# Patient Record
Sex: Female | Born: 1980 | Race: Black or African American | Hispanic: No | Smoking: Former smoker
Health system: Southern US, Community
[De-identification: ages and names within clinical notes are randomized; demographics above are authoritative.]

## PROBLEM LIST (undated history)

## (undated) DIAGNOSIS — R519 Headache, unspecified: Secondary | ICD-10-CM

## (undated) DIAGNOSIS — K219 Gastro-esophageal reflux disease without esophagitis: Secondary | ICD-10-CM

## (undated) DIAGNOSIS — K5900 Constipation, unspecified: Secondary | ICD-10-CM

## (undated) DIAGNOSIS — I10 Essential (primary) hypertension: Secondary | ICD-10-CM

## (undated) DIAGNOSIS — A568 Sexually transmitted chlamydial infection of other sites: Secondary | ICD-10-CM

## (undated) DIAGNOSIS — F32A Depression, unspecified: Secondary | ICD-10-CM

## (undated) DIAGNOSIS — M722 Plantar fascial fibromatosis: Secondary | ICD-10-CM

## (undated) DIAGNOSIS — R51 Headache: Secondary | ICD-10-CM

## (undated) DIAGNOSIS — R87619 Unspecified abnormal cytological findings in specimens from cervix uteri: Secondary | ICD-10-CM

## (undated) DIAGNOSIS — N760 Acute vaginitis: Secondary | ICD-10-CM

## (undated) DIAGNOSIS — IMO0002 Reserved for concepts with insufficient information to code with codable children: Secondary | ICD-10-CM

## (undated) DIAGNOSIS — F329 Major depressive disorder, single episode, unspecified: Secondary | ICD-10-CM

## (undated) DIAGNOSIS — B9689 Other specified bacterial agents as the cause of diseases classified elsewhere: Secondary | ICD-10-CM

## (undated) DIAGNOSIS — N938 Other specified abnormal uterine and vaginal bleeding: Secondary | ICD-10-CM

## (undated) DIAGNOSIS — J45909 Unspecified asthma, uncomplicated: Secondary | ICD-10-CM

## (undated) DIAGNOSIS — M719 Bursopathy, unspecified: Secondary | ICD-10-CM

## (undated) DIAGNOSIS — G5710 Meralgia paresthetica, unspecified lower limb: Secondary | ICD-10-CM

## (undated) DIAGNOSIS — Z8489 Family history of other specified conditions: Secondary | ICD-10-CM

## (undated) DIAGNOSIS — T7840XA Allergy, unspecified, initial encounter: Secondary | ICD-10-CM

## (undated) HISTORY — PX: WISDOM TOOTH EXTRACTION: SHX21

## (undated) HISTORY — DX: Allergy, unspecified, initial encounter: T78.40XA

## (undated) HISTORY — DX: Plantar fascial fibromatosis: M72.2

## (undated) HISTORY — PX: WRIST SURGERY: SHX841

## (undated) HISTORY — DX: Headache: R51

## (undated) HISTORY — DX: Constipation, unspecified: K59.00

## (undated) HISTORY — PX: TUBAL LIGATION: SHX77

## (undated) HISTORY — PX: GYNECOLOGIC CRYOSURGERY: SHX857

## (undated) HISTORY — DX: Meralgia paresthetica, unspecified lower limb: G57.10

## (undated) HISTORY — DX: Headache, unspecified: R51.9

## (undated) HISTORY — DX: Bursopathy, unspecified: M71.9

## (undated) HISTORY — DX: Depression, unspecified: F32.A

---
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
## 2003-12-06 ENCOUNTER — Emergency Department (HOSPITAL_COMMUNITY): Admission: EM | Admit: 2003-12-06 | Discharge: 2003-12-07 | Payer: Self-pay | Admitting: Emergency Medicine

## 2004-01-20 ENCOUNTER — Inpatient Hospital Stay (HOSPITAL_COMMUNITY): Admission: AD | Admit: 2004-01-20 | Discharge: 2004-01-21 | Payer: Self-pay | Admitting: *Deleted

## 2004-02-27 ENCOUNTER — Inpatient Hospital Stay (HOSPITAL_COMMUNITY): Admission: AD | Admit: 2004-02-27 | Discharge: 2004-02-27 | Payer: Self-pay | Admitting: Obstetrics and Gynecology

## 2004-03-04 ENCOUNTER — Ambulatory Visit (HOSPITAL_COMMUNITY): Admission: RE | Admit: 2004-03-04 | Discharge: 2004-03-04 | Payer: Self-pay | Admitting: Family Medicine

## 2004-08-04 ENCOUNTER — Inpatient Hospital Stay (HOSPITAL_COMMUNITY): Admission: AD | Admit: 2004-08-04 | Discharge: 2004-08-07 | Payer: Self-pay | Admitting: *Deleted

## 2005-08-27 ENCOUNTER — Emergency Department (HOSPITAL_COMMUNITY): Admission: EM | Admit: 2005-08-27 | Discharge: 2005-08-27 | Payer: Self-pay | Admitting: Emergency Medicine

## 2006-06-25 ENCOUNTER — Inpatient Hospital Stay (HOSPITAL_COMMUNITY): Admission: AD | Admit: 2006-06-25 | Discharge: 2006-06-25 | Payer: Self-pay | Admitting: Obstetrics and Gynecology

## 2006-09-07 ENCOUNTER — Emergency Department (HOSPITAL_COMMUNITY): Admission: EM | Admit: 2006-09-07 | Discharge: 2006-09-07 | Payer: Self-pay | Admitting: *Deleted

## 2006-09-13 ENCOUNTER — Emergency Department (HOSPITAL_COMMUNITY): Admission: EM | Admit: 2006-09-13 | Discharge: 2006-09-13 | Payer: Self-pay | Admitting: Family Medicine

## 2007-02-07 ENCOUNTER — Inpatient Hospital Stay (HOSPITAL_COMMUNITY): Admission: AD | Admit: 2007-02-07 | Discharge: 2007-02-08 | Payer: Self-pay | Admitting: Obstetrics & Gynecology

## 2007-03-28 ENCOUNTER — Inpatient Hospital Stay (HOSPITAL_COMMUNITY): Admission: AD | Admit: 2007-03-28 | Discharge: 2007-03-28 | Payer: Self-pay | Admitting: Obstetrics

## 2007-05-19 ENCOUNTER — Inpatient Hospital Stay (HOSPITAL_COMMUNITY): Admission: AD | Admit: 2007-05-19 | Discharge: 2007-05-19 | Payer: Self-pay | Admitting: Obstetrics

## 2007-05-20 ENCOUNTER — Inpatient Hospital Stay (HOSPITAL_COMMUNITY): Admission: AD | Admit: 2007-05-20 | Discharge: 2007-05-20 | Payer: Self-pay | Admitting: Obstetrics

## 2007-07-23 ENCOUNTER — Inpatient Hospital Stay (HOSPITAL_COMMUNITY): Admission: AD | Admit: 2007-07-23 | Discharge: 2007-07-25 | Payer: Self-pay | Admitting: Obstetrics

## 2007-07-24 ENCOUNTER — Encounter (INDEPENDENT_AMBULATORY_CARE_PROVIDER_SITE_OTHER): Payer: Self-pay | Admitting: Obstetrics

## 2007-11-23 ENCOUNTER — Inpatient Hospital Stay (HOSPITAL_COMMUNITY): Admission: AD | Admit: 2007-11-23 | Discharge: 2007-11-23 | Payer: Self-pay | Admitting: Obstetrics

## 2007-12-11 ENCOUNTER — Inpatient Hospital Stay (HOSPITAL_COMMUNITY): Admission: AD | Admit: 2007-12-11 | Discharge: 2007-12-11 | Payer: Self-pay | Admitting: Obstetrics

## 2008-03-20 ENCOUNTER — Inpatient Hospital Stay (HOSPITAL_COMMUNITY): Admission: AD | Admit: 2008-03-20 | Discharge: 2008-03-20 | Payer: Self-pay | Admitting: Obstetrics

## 2008-04-26 ENCOUNTER — Emergency Department (HOSPITAL_COMMUNITY): Admission: EM | Admit: 2008-04-26 | Discharge: 2008-04-26 | Payer: Self-pay | Admitting: Emergency Medicine

## 2008-07-02 ENCOUNTER — Emergency Department (HOSPITAL_COMMUNITY): Admission: EM | Admit: 2008-07-02 | Discharge: 2008-07-03 | Payer: Self-pay | Admitting: Emergency Medicine

## 2008-12-30 ENCOUNTER — Emergency Department (HOSPITAL_BASED_OUTPATIENT_CLINIC_OR_DEPARTMENT_OTHER): Admission: EM | Admit: 2008-12-30 | Discharge: 2008-12-30 | Payer: Self-pay | Admitting: Emergency Medicine

## 2009-03-22 ENCOUNTER — Emergency Department (HOSPITAL_BASED_OUTPATIENT_CLINIC_OR_DEPARTMENT_OTHER): Admission: EM | Admit: 2009-03-22 | Discharge: 2009-03-22 | Payer: Self-pay | Admitting: Emergency Medicine

## 2009-06-17 ENCOUNTER — Emergency Department (HOSPITAL_BASED_OUTPATIENT_CLINIC_OR_DEPARTMENT_OTHER): Admission: EM | Admit: 2009-06-17 | Discharge: 2009-06-17 | Payer: Self-pay | Admitting: Emergency Medicine

## 2009-08-30 ENCOUNTER — Emergency Department (HOSPITAL_COMMUNITY): Admission: EM | Admit: 2009-08-30 | Discharge: 2009-08-31 | Payer: Self-pay | Admitting: Emergency Medicine

## 2009-09-17 ENCOUNTER — Emergency Department (HOSPITAL_BASED_OUTPATIENT_CLINIC_OR_DEPARTMENT_OTHER): Admission: EM | Admit: 2009-09-17 | Discharge: 2009-09-17 | Payer: Self-pay | Admitting: Emergency Medicine

## 2009-11-14 ENCOUNTER — Emergency Department (HOSPITAL_COMMUNITY): Admission: EM | Admit: 2009-11-14 | Discharge: 2009-11-14 | Payer: Self-pay | Admitting: Family Medicine

## 2010-01-03 ENCOUNTER — Emergency Department (HOSPITAL_BASED_OUTPATIENT_CLINIC_OR_DEPARTMENT_OTHER): Admission: EM | Admit: 2010-01-03 | Discharge: 2010-01-03 | Payer: Self-pay | Admitting: Emergency Medicine

## 2010-03-29 ENCOUNTER — Emergency Department (HOSPITAL_BASED_OUTPATIENT_CLINIC_OR_DEPARTMENT_OTHER): Admission: EM | Admit: 2010-03-29 | Discharge: 2010-03-29 | Payer: Self-pay | Admitting: Emergency Medicine

## 2010-04-03 ENCOUNTER — Emergency Department (HOSPITAL_BASED_OUTPATIENT_CLINIC_OR_DEPARTMENT_OTHER): Admission: EM | Admit: 2010-04-03 | Discharge: 2010-04-03 | Payer: Self-pay | Admitting: Emergency Medicine

## 2010-04-27 ENCOUNTER — Emergency Department (HOSPITAL_BASED_OUTPATIENT_CLINIC_OR_DEPARTMENT_OTHER): Admission: EM | Admit: 2010-04-27 | Discharge: 2010-04-27 | Payer: Self-pay | Admitting: Emergency Medicine

## 2010-10-16 ENCOUNTER — Emergency Department (HOSPITAL_BASED_OUTPATIENT_CLINIC_OR_DEPARTMENT_OTHER): Admission: EM | Admit: 2010-10-16 | Discharge: 2010-10-16 | Payer: Self-pay | Admitting: Emergency Medicine

## 2010-10-16 ENCOUNTER — Ambulatory Visit: Payer: Self-pay | Admitting: Diagnostic Radiology

## 2010-11-25 ENCOUNTER — Emergency Department (HOSPITAL_BASED_OUTPATIENT_CLINIC_OR_DEPARTMENT_OTHER)
Admission: EM | Admit: 2010-11-25 | Discharge: 2010-11-25 | Payer: Self-pay | Source: Home / Self Care | Admitting: Emergency Medicine

## 2010-11-30 DIAGNOSIS — A568 Sexually transmitted chlamydial infection of other sites: Secondary | ICD-10-CM

## 2010-11-30 HISTORY — DX: Sexually transmitted chlamydial infection of other sites: A56.8

## 2010-12-21 ENCOUNTER — Encounter: Payer: Self-pay | Admitting: Obstetrics & Gynecology

## 2011-02-09 LAB — URINALYSIS, ROUTINE W REFLEX MICROSCOPIC
Bilirubin Urine: NEGATIVE
Hgb urine dipstick: NEGATIVE
Nitrite: NEGATIVE
Specific Gravity, Urine: 1.023 (ref 1.005–1.030)
Urobilinogen, UA: 0.2 mg/dL (ref 0.0–1.0)
pH: 6.5 (ref 5.0–8.0)

## 2011-02-09 LAB — WET PREP, GENITAL
Trich, Wet Prep: NONE SEEN
Yeast Wet Prep HPF POC: NONE SEEN

## 2011-02-09 LAB — GC/CHLAMYDIA PROBE AMP, GENITAL: Chlamydia, DNA Probe: NEGATIVE

## 2011-02-09 LAB — PREGNANCY, URINE: Preg Test, Ur: NEGATIVE

## 2011-02-11 ENCOUNTER — Emergency Department (HOSPITAL_BASED_OUTPATIENT_CLINIC_OR_DEPARTMENT_OTHER)
Admission: EM | Admit: 2011-02-11 | Discharge: 2011-02-11 | Disposition: A | Discharge status: Home / Self Care | Payer: Self-pay | Attending: Emergency Medicine | Admitting: Emergency Medicine

## 2011-02-11 DIAGNOSIS — R1013 Epigastric pain: Secondary | ICD-10-CM | POA: Insufficient documentation

## 2011-02-11 LAB — URINALYSIS, ROUTINE W REFLEX MICROSCOPIC
Bilirubin Urine: NEGATIVE
Hgb urine dipstick: NEGATIVE
Ketones, ur: NEGATIVE mg/dL
Nitrite: NEGATIVE
Specific Gravity, Urine: 1.028 (ref 1.005–1.030)
pH: 6 (ref 5.0–8.0)

## 2011-02-16 LAB — RAPID STREP SCREEN (MED CTR MEBANE ONLY): Streptococcus, Group A Screen (Direct): NEGATIVE

## 2011-02-17 LAB — URINALYSIS, ROUTINE W REFLEX MICROSCOPIC
Glucose, UA: NEGATIVE mg/dL
Ketones, ur: NEGATIVE mg/dL
Nitrite: NEGATIVE
Specific Gravity, Urine: 1.014 (ref 1.005–1.030)
pH: 6 (ref 5.0–8.0)

## 2011-02-17 LAB — URINE MICROSCOPIC-ADD ON

## 2011-02-18 LAB — GC/CHLAMYDIA PROBE AMP, GENITAL
Chlamydia, DNA Probe: NEGATIVE
GC Probe Amp, Genital: POSITIVE — AB

## 2011-02-18 LAB — URINALYSIS, ROUTINE W REFLEX MICROSCOPIC
Bilirubin Urine: NEGATIVE
Glucose, UA: NEGATIVE mg/dL
Hgb urine dipstick: NEGATIVE
Protein, ur: NEGATIVE mg/dL
Urobilinogen, UA: 0.2 mg/dL (ref 0.0–1.0)

## 2011-02-18 LAB — RPR: RPR Ser Ql: NONREACTIVE

## 2011-02-18 LAB — WET PREP, GENITAL: Trich, Wet Prep: NONE SEEN

## 2011-02-18 LAB — URINE MICROSCOPIC-ADD ON

## 2011-03-02 LAB — POCT URINALYSIS DIP (DEVICE)
Bilirubin Urine: NEGATIVE
Hgb urine dipstick: NEGATIVE
Ketones, ur: NEGATIVE mg/dL
Nitrite: NEGATIVE
Protein, ur: NEGATIVE mg/dL
pH: 6.5 (ref 5.0–8.0)

## 2011-03-02 LAB — GC/CHLAMYDIA PROBE AMP, GENITAL
Chlamydia, DNA Probe: NEGATIVE
GC Probe Amp, Genital: NEGATIVE

## 2011-03-02 LAB — WET PREP, GENITAL
Clue Cells Wet Prep HPF POC: NONE SEEN
Trich, Wet Prep: NONE SEEN

## 2011-03-05 LAB — URINALYSIS, ROUTINE W REFLEX MICROSCOPIC
Bilirubin Urine: NEGATIVE
Nitrite: NEGATIVE
Protein, ur: 30 mg/dL — AB
Specific Gravity, Urine: 1.03 (ref 1.005–1.030)
Urobilinogen, UA: 0.2 mg/dL (ref 0.0–1.0)
pH: 6 (ref 5.0–8.0)

## 2011-03-05 LAB — PREGNANCY, URINE: Preg Test, Ur: NEGATIVE

## 2011-03-05 LAB — URINE MICROSCOPIC-ADD ON

## 2011-03-05 LAB — GC/CHLAMYDIA PROBE AMP, GENITAL: Chlamydia, DNA Probe: NEGATIVE

## 2011-03-05 LAB — WET PREP, GENITAL: Trich, Wet Prep: NONE SEEN

## 2011-03-08 LAB — URINALYSIS, ROUTINE W REFLEX MICROSCOPIC
Bilirubin Urine: NEGATIVE
Hgb urine dipstick: NEGATIVE
Nitrite: NEGATIVE
Specific Gravity, Urine: 1.028 (ref 1.005–1.030)
pH: 6.5 (ref 5.0–8.0)

## 2011-03-08 LAB — URINE MICROSCOPIC-ADD ON

## 2011-03-11 LAB — PREGNANCY, URINE: Preg Test, Ur: NEGATIVE

## 2011-03-11 LAB — GC/CHLAMYDIA PROBE AMP, GENITAL: GC Probe Amp, Genital: NEGATIVE

## 2011-03-11 LAB — URINALYSIS, ROUTINE W REFLEX MICROSCOPIC
Bilirubin Urine: NEGATIVE
Ketones, ur: NEGATIVE mg/dL
Nitrite: NEGATIVE
pH: 6 (ref 5.0–8.0)

## 2011-03-11 LAB — WET PREP, GENITAL: Trich, Wet Prep: NONE SEEN

## 2011-03-11 LAB — URINE CULTURE: Colony Count: 10000

## 2011-03-11 LAB — URINE MICROSCOPIC-ADD ON

## 2011-03-16 LAB — PREGNANCY, URINE: Preg Test, Ur: NEGATIVE

## 2011-03-16 LAB — URINALYSIS, ROUTINE W REFLEX MICROSCOPIC
Bilirubin Urine: NEGATIVE
Ketones, ur: NEGATIVE mg/dL
Nitrite: NEGATIVE
Protein, ur: NEGATIVE mg/dL
Urobilinogen, UA: 0.2 mg/dL (ref 0.0–1.0)
pH: 6 (ref 5.0–8.0)

## 2011-03-16 LAB — WET PREP, GENITAL
Trich, Wet Prep: NONE SEEN
Yeast Wet Prep HPF POC: NONE SEEN

## 2011-03-16 LAB — GC/CHLAMYDIA PROBE AMP, GENITAL: Chlamydia, DNA Probe: POSITIVE — AB

## 2011-04-17 NOTE — Op Note (Signed)
NAME:  Lauren Garza, Lauren Garza                      ACCOUNT NO.:  [id number]   MEDICAL RECORD NO.:  [id number]                   PATIENT TYPE:  INP   LOCATION:  9109                                 FACILITY:  [facility]   PHYSICIAN:  Kathreen Cosier, M.D.           DATE OF BIRTH:  17-May-1981   DATE OF PROCEDURE:  08/05/2004  DATE OF DISCHARGE:                                 OPERATIVE REPORT   The patient is a 30 year old gravida 2, para 1-0-0-1, [facility] August 03, 2004,  who became further dilated at 2:40 a.m. on August 05, 2004.  Started  having prolonged decels in fetal heart at that time.  She pushed to +3 stage  and vacuum applied.  There was no pop off through one contraction until  delivery of the vertex from ROA position.  Nuchal cord times one was loose  and reduced prior to delivery.  She had a female, Apgar 9 and 9, placenta was  spontaneous, intact.  She had a right sulcus tear and a first degree  perineal which were repaired with 2-0 Vicryl suture.  Delivery was effected  at 2:51.  Placenta was spontaneous and intact and the sulcus and the  perineum repaired with 2-0 Vicryl suture.                                               Kathreen Cosier, M.D.    BAM/MEDQ  D:  08/05/2004  T:  08/05/2004  Job:  [id number]

## 2011-04-17 NOTE — Op Note (Signed)
[facility]  Patient:    Lauren Garza, Lauren Garza Visit Number: [id number] MRN: [id number]          Service Type: OBS Location: 910A [id number] [id number] Attending Physician:  Venita Sheffield Dictated by:   Kathreen Cosier, M.D. Proc. Date: 12/16/01 Admit Date:  12/16/2001 Discharge Date: 12/18/2001                             Operative Report  PREOPERATIVE DIAGNOSIS:  POSTOPERATIVE DIAGNOSIS:  OPERATION:                    Vacuum-assisted vaginal delivery.  SURGEON:                      Kathreen Cosier, M.D.  ASSISTANT:  ANESTHESIA:  ESTIMATED BLOOD LOSS:  INDICATIONS:                  The patient is an 30 year old primigravida admitted at 4 cm, who made rapid progress and during her epidural developed a change in baseline and deep variables.  Normal blood pressure.  I was called because of this.  When I got in the room at 5:29 a.m. she was anterior lip, 0 station, pushing well.  The patient pushed a number of times and fetal heart rate was slow.  DESCRIPTION OF PROCEDURE:     Midline episiotomy was done and the vacuum applied at the +3 station.  Pressure was applied for one contraction with no popoff and delivery was effected from the LOA position of a female, Apgars 7 and 9.  The baby was DeLeed prior to delivery of the shoulders.  The episiotomy was repaired with 2-0 Vicryl and placenta delivered spontaneously. Dictated by:   Kathreen Cosier, M.D. Attending Physician:  Venita Sheffield DD:  12/16/01 TD:  12/18/01 Job: [id number]

## 2011-04-18 ENCOUNTER — Emergency Department (HOSPITAL_COMMUNITY)
Admission: EM | Admit: 2011-04-18 | Discharge: 2011-04-19 | Disposition: A | Discharge status: Home / Self Care | Payer: No Typology Code available for payment source | Attending: Emergency Medicine | Admitting: Emergency Medicine

## 2011-04-18 DIAGNOSIS — M25529 Pain in unspecified elbow: Secondary | ICD-10-CM | POA: Insufficient documentation

## 2011-04-18 DIAGNOSIS — Y92410 Unspecified street and highway as the place of occurrence of the external cause: Secondary | ICD-10-CM | POA: Insufficient documentation

## 2011-04-18 DIAGNOSIS — T1490XA Injury, unspecified, initial encounter: Secondary | ICD-10-CM | POA: Insufficient documentation

## 2011-04-18 DIAGNOSIS — M25519 Pain in unspecified shoulder: Secondary | ICD-10-CM | POA: Insufficient documentation

## 2011-04-19 ENCOUNTER — Emergency Department (HOSPITAL_COMMUNITY): Payer: No Typology Code available for payment source

## 2011-08-15 ENCOUNTER — Encounter (HOSPITAL_COMMUNITY): Payer: Self-pay | Admitting: *Deleted

## 2011-08-15 ENCOUNTER — Inpatient Hospital Stay (HOSPITAL_COMMUNITY)
Admission: AD | Admit: 2011-08-15 | Discharge: 2011-08-16 | Disposition: A | Discharge status: Home / Self Care | Payer: Medicaid Other | Source: Ambulatory Visit | Attending: Obstetrics | Admitting: Obstetrics

## 2011-08-15 DIAGNOSIS — R109 Unspecified abdominal pain: Secondary | ICD-10-CM | POA: Insufficient documentation

## 2011-08-15 HISTORY — DX: Unspecified abnormal cytological findings in specimens from cervix uteri: R87.619

## 2011-08-15 HISTORY — DX: Reserved for concepts with insufficient information to code with codable children: IMO0002

## 2011-08-15 LAB — URINE MICROSCOPIC-ADD ON

## 2011-08-15 LAB — URINALYSIS, ROUTINE W REFLEX MICROSCOPIC
Bilirubin Urine: NEGATIVE
Ketones, ur: NEGATIVE mg/dL
Nitrite: NEGATIVE
Protein, ur: NEGATIVE mg/dL

## 2011-08-15 LAB — POCT PREGNANCY, URINE: Preg Test, Ur: NEGATIVE

## 2011-08-15 MED ORDER — KETOROLAC TROMETHAMINE 60 MG/2ML IM SOLN
60.0000 mg | Freq: Four times a day (QID) | INTRAMUSCULAR | Status: DC | PRN
  Administered 2011-08-16: 60 mg via INTRAMUSCULAR
  Filled 2011-08-15: qty 2

## 2011-08-15 NOTE — Progress Notes (Signed)
Pt presents to mau for sharp abdominal pain and lower back pain.  Started about Wednesday night to Thursday morning.

## 2011-08-15 NOTE — Progress Notes (Signed)
Wet prep and cultures collected.  VE done per CNM.

## 2011-08-15 NOTE — Progress Notes (Signed)
K. Shaw, CNM at bedside.  Assessment done and poc discussed with pt.  

## 2011-08-15 NOTE — ED Provider Notes (Signed)
Chief Complaint:  Abdominal Pain   Lauren Garza is  30 y.o. [id number].  Patient's last menstrual period was 07/31/2011.Marland Kitchen  Her pregnancy status is negative.  She presents complaining of Abdominal Pain in the lower quads and radiating to her left side. Onset is described as gradual and has been present for  3 days.   Obstetrical/Gynecological History: Pertinent Gynecological History: Menses: flow is moderate Contraception: tubal ligation    Past Medical History: Past Medical History  Diagnosis Date  . Abnormal Pap smear     Past Surgical History: Past Surgical History  Procedure Date  . Gynecologic cryosurgery   . Tubal ligation     Family History: No family history on file.  Social History: History  Substance Use Topics  . Smoking status: Never Smoker   . Smokeless tobacco: Not on file  . Alcohol Use: Yes    Allergies:  Allergies  Allergen Reactions  . Clindamycin/Lincomycin Hives  . Flagyl (Metronidazole Hcl) Hives    Prescriptions prior to admission  Medication Sig Dispense Refill  . ibuprofen (ADVIL,MOTRIN) 200 MG tablet Take 400 mg by mouth every 6 (six) hours as needed. For pain         Review of Systems - neg for N/V but did have a loose stool today; no fever; ? Dysuria but not urgency  Physical Exam   Blood pressure 122/78, pulse 83, temperature 99.2 F (37.3 C), temperature source Oral, resp. rate 18, height 5' 4.25" (1.632 m), weight 98.884 kg (218 lb), last menstrual period 07/31/2011.  General: General appearance - alert, well appearing, and in no distress Focused Gynecological Exam: normal external genitalia, vulva, vagina, cervix, uterus and adnexa; no pain on bimanual; sm white/clear vag d/c; cx C/L/nontender ABD: no pain elicited on abd exam; neg for CVAT bilat  Labs:  Results for orders placed during the hospital encounter of 08/15/11 (from the past 24 hour(s))  URINALYSIS, ROUTINE W REFLEX MICROSCOPIC     Status: Abnormal   Collection Time   08/15/11 11:11 PM      Component Value Range   Color, Urine YELLOW  YELLOW    Appearance CLEAR  CLEAR    Specific Gravity, Urine 1.020  1.005 - 1.030    pH 6.5  5.0 - 8.0    Glucose, UA NEGATIVE  NEGATIVE (mg/dL)   Hgb urine dipstick NEGATIVE  NEGATIVE    Bilirubin Urine NEGATIVE  NEGATIVE    Ketones, ur NEGATIVE  NEGATIVE (mg/dL)   Protein, ur NEGATIVE  NEGATIVE (mg/dL)   Urobilinogen, UA 0.2  0.0 - 1.0 (mg/dL)   Nitrite NEGATIVE  NEGATIVE    Leukocytes, UA TRACE (*) NEGATIVE   URINE MICROSCOPIC-ADD ON     Status: Abnormal   Collection Time   08/15/11 11:11 PM      Component Value Range   Squamous Epithelial / LPF RARE  RARE    WBC, UA 3-6  <3 (WBC/hpf)   RBC / HPF 0-2  <3 (RBC/hpf)   Bacteria, UA FEW (*) RARE   POCT PREGNANCY, URINE     Status: Normal   Collection Time   08/15/11 11:18 PM      Component Value Range   Preg Test, Ur NEGATIVE    WET PREP, GENITAL     Status: Abnormal   Collection Time   08/15/11 11:53 PM      Component Value Range   Yeast, Wet Prep NONE SEEN  NONE SEEN    Trich, Wet Prep NONE SEEN  NONE SEEN    Clue Cells, Wet Prep NONE SEEN  NONE SEEN    WBC, Wet Prep HPF POC FEW (*) NONE SEEN                                                                                                                                Imaging Studies:  No results found.   Assessment: Lower abd cramping  Plan: GC/chlam pending Toradol 60mg  IM given but no relief yet (probably not enough time yet) Will d/c home given pain unable to be produced on exam To follow up with primary care provider if pain continues  Cam Hai 08/16/2011 12:34 AM

## 2011-08-18 LAB — GC/CHLAMYDIA PROBE AMP, GENITAL
Chlamydia, DNA Probe: NEGATIVE
GC Probe Amp, Genital: NEGATIVE

## 2011-08-19 LAB — URINALYSIS, ROUTINE W REFLEX MICROSCOPIC
Glucose, UA: NEGATIVE
Ketones, ur: NEGATIVE
Protein, ur: NEGATIVE
Urobilinogen, UA: 0.2

## 2011-08-19 LAB — GC/CHLAMYDIA PROBE AMP, GENITAL: GC Probe Amp, Genital: NEGATIVE

## 2011-08-19 LAB — POCT PREGNANCY, URINE: Preg Test, Ur: NEGATIVE

## 2011-08-19 LAB — WET PREP, GENITAL: Yeast Wet Prep HPF POC: NONE SEEN

## 2011-08-25 LAB — GC/CHLAMYDIA PROBE AMP, GENITAL
Chlamydia, DNA Probe: POSITIVE — AB
GC Probe Amp, Genital: NEGATIVE

## 2011-08-25 LAB — WET PREP, GENITAL: Clue Cells Wet Prep HPF POC: NONE SEEN

## 2011-08-26 LAB — URINALYSIS, ROUTINE W REFLEX MICROSCOPIC
Leukocytes, UA: NEGATIVE
Nitrite: NEGATIVE
Specific Gravity, Urine: >1.03 — ABNORMAL HIGH
Urobilinogen, UA: 0.2

## 2011-08-26 LAB — COMPREHENSIVE METABOLIC PANEL
BUN: 9
CO2: 25
Calcium: 9.6
Chloride: 108
Creatinine, Ser: 0.85
GFR calc non Af Amer: >60
Glucose, Bld: 105 — ABNORMAL HIGH
Total Bilirubin: 1

## 2011-08-26 LAB — DIFFERENTIAL
Basophils Absolute: 0
Eosinophils Relative: 2
Lymphocytes Relative: 5 — ABNORMAL LOW
Lymphs Abs: 0.6 — ABNORMAL LOW
Neutro Abs: 9.7 — ABNORMAL HIGH
Neutrophils Relative %: 90 — ABNORMAL HIGH

## 2011-08-26 LAB — CBC
HCT: 41.2
MCHC: 32.6
MCV: 81.9
RBC: 5.03
WBC: 10.8 — ABNORMAL HIGH

## 2011-08-26 LAB — LIPASE, BLOOD: Lipase: 13

## 2011-08-26 LAB — URINE MICROSCOPIC-ADD ON

## 2011-08-28 LAB — GC/CHLAMYDIA PROBE AMP, GENITAL
Chlamydia, DNA Probe: POSITIVE — AB
GC Probe Amp, Genital: NEGATIVE

## 2011-08-28 LAB — URINALYSIS, ROUTINE W REFLEX MICROSCOPIC
Glucose, UA: NEGATIVE
Hgb urine dipstick: NEGATIVE
Ketones, ur: NEGATIVE
Protein, ur: NEGATIVE

## 2011-08-28 LAB — WET PREP, GENITAL: Yeast Wet Prep HPF POC: NONE SEEN

## 2011-08-28 LAB — PREGNANCY, URINE: Preg Test, Ur: NEGATIVE

## 2011-09-04 LAB — WET PREP, GENITAL: Trich, Wet Prep: NONE SEEN

## 2011-09-04 LAB — POCT PREGNANCY, URINE: Operator id: 12087

## 2011-09-11 LAB — RPR: RPR Ser Ql: NONREACTIVE

## 2011-09-11 LAB — CBC
HCT: 33.4 — ABNORMAL LOW
Hemoglobin: 10.8 — ABNORMAL LOW
RBC: 4.35
WBC: 10.1
WBC: 12.3 — ABNORMAL HIGH

## 2011-09-16 ENCOUNTER — Other Ambulatory Visit (HOSPITAL_COMMUNITY): Payer: Self-pay | Admitting: Obstetrics

## 2011-09-16 LAB — WET PREP, GENITAL
Trich, Wet Prep: NONE SEEN
Yeast Wet Prep HPF POC: NONE SEEN

## 2011-09-16 LAB — URINALYSIS, ROUTINE W REFLEX MICROSCOPIC
Bilirubin Urine: NEGATIVE
Glucose, UA: NEGATIVE
Nitrite: NEGATIVE
Protein, ur: NEGATIVE
Specific Gravity, Urine: 1.01
pH: 6.5
pH: 7

## 2011-09-16 LAB — URINE MICROSCOPIC-ADD ON

## 2011-09-22 ENCOUNTER — Ambulatory Visit (HOSPITAL_COMMUNITY)
Admission: RE | Admit: 2011-09-22 | Discharge: 2011-09-22 | Disposition: A | Discharge status: Home / Self Care | Payer: Medicaid Other | Source: Ambulatory Visit | Attending: Obstetrics | Admitting: Obstetrics

## 2011-09-22 DIAGNOSIS — R109 Unspecified abdominal pain: Secondary | ICD-10-CM | POA: Insufficient documentation

## 2011-09-22 DIAGNOSIS — R110 Nausea: Secondary | ICD-10-CM | POA: Insufficient documentation

## 2012-06-20 ENCOUNTER — Emergency Department (HOSPITAL_BASED_OUTPATIENT_CLINIC_OR_DEPARTMENT_OTHER)
Admission: EM | Admit: 2012-06-20 | Discharge: 2012-06-20 | Disposition: A | Discharge status: Home / Self Care | Payer: Medicaid Other | Attending: Emergency Medicine | Admitting: Emergency Medicine

## 2012-06-20 ENCOUNTER — Encounter (HOSPITAL_BASED_OUTPATIENT_CLINIC_OR_DEPARTMENT_OTHER): Payer: Self-pay | Admitting: *Deleted

## 2012-06-20 DIAGNOSIS — N760 Acute vaginitis: Secondary | ICD-10-CM | POA: Insufficient documentation

## 2012-06-20 DIAGNOSIS — R109 Unspecified abdominal pain: Secondary | ICD-10-CM

## 2012-06-20 DIAGNOSIS — B9689 Other specified bacterial agents as the cause of diseases classified elsewhere: Secondary | ICD-10-CM

## 2012-06-20 DIAGNOSIS — E669 Obesity, unspecified: Secondary | ICD-10-CM | POA: Insufficient documentation

## 2012-06-20 DIAGNOSIS — A499 Bacterial infection, unspecified: Secondary | ICD-10-CM | POA: Insufficient documentation

## 2012-06-20 HISTORY — DX: Sexually transmitted chlamydial infection of other sites: A56.8

## 2012-06-20 HISTORY — DX: Other specified bacterial agents as the cause of diseases classified elsewhere: N76.0

## 2012-06-20 HISTORY — DX: Other specified bacterial agents as the cause of diseases classified elsewhere: B96.89

## 2012-06-20 LAB — PREGNANCY, URINE: Preg Test, Ur: NEGATIVE

## 2012-06-20 LAB — COMPREHENSIVE METABOLIC PANEL
BUN: 12 mg/dL (ref 6–23)
CO2: 25 mEq/L (ref 19–32)
Calcium: 9.6 mg/dL (ref 8.4–10.5)
Creatinine, Ser: 0.8 mg/dL (ref 0.50–1.10)
GFR calc Af Amer: >90 mL/min (ref >90)
GFR calc non Af Amer: >90 mL/min (ref >90)
Glucose, Bld: 87 mg/dL (ref 70–99)
Total Protein: 7.6 g/dL (ref 6.0–8.3)

## 2012-06-20 LAB — CBC
HCT: 35.8 % — ABNORMAL LOW (ref 36.0–46.0)
Hemoglobin: 11.8 g/dL — ABNORMAL LOW (ref 12.0–15.0)
MCH: 26.3 pg (ref 26.0–34.0)
MCHC: 33 g/dL (ref 30.0–36.0)
MCV: 79.7 fL (ref 78.0–100.0)
RBC: 4.49 MIL/uL (ref 3.87–5.11)

## 2012-06-20 LAB — URINALYSIS, ROUTINE W REFLEX MICROSCOPIC
Glucose, UA: NEGATIVE mg/dL
Nitrite: NEGATIVE
Protein, ur: NEGATIVE mg/dL
Urobilinogen, UA: 1 mg/dL (ref 0.0–1.0)

## 2012-06-20 LAB — WET PREP, GENITAL

## 2012-06-20 LAB — URINE MICROSCOPIC-ADD ON

## 2012-06-20 MED ORDER — CLOTRIMAZOLE 1 % VA CREA: 1.0000 | TOPICAL_CREAM | Freq: Two times a day (BID) | VAGINAL | Status: AC

## 2012-06-20 MED ORDER — METRONIDAZOLE 0.75 % EX GEL: Freq: Every day | CUTANEOUS | Status: AC

## 2012-06-20 NOTE — ED Provider Notes (Signed)
History     CSN: [id number]  Arrival date & time 06/20/12  1509   First MD Initiated Contact with Patient 06/20/12 1523      Chief Complaint  Patient presents with  . Abdominal Pain    (Consider location/radiation/quality/duration/timing/severity/associated sxs/prior treatment) HPI  31 year old female presenting with pain in her lower abdomen bilaterally that feels like menstrual cramps. It is 3 weeks in duration. She also reports sharp pain in her vagina. It occurred twice and was momentary. It is not associated with physical activity, sex, or having a bowel movement. The patient reports vaginal discharge present - greenish color, small amount, looks mucous. 7 weeks ago she had unprotected sex with partner who STD status is unknown. She has history of Chlamydia in December 2012 and was treated, as well as BV several times. She denies a history of herpes or hepatitis. Currently, she has no dysuria, no constipation, no blood in urine on stool. Her OB-GYN is Dr. Gaynell Face.     Past Medical History  Diagnosis Date  . Abnormal Pap smear   . Chlamydia trachomatis infection 2012  . Bacterial vaginosis     Past Surgical History  Procedure Date  . Gynecologic cryosurgery   . Tubal ligation     No family history on file.  History  Substance Use Topics  . Smoking status: Never Smoker   . Smokeless tobacco: Not on file  . Alcohol Use: Yes    OB History    Grav Para Term Preterm Abortions TAB SAB Ect Mult Living   3 3 3  0 0 0 0 0 0 3      Review of Systems  All other systems reviewed and are negative.    Allergies  Clindamycin/lincomycin and Flagyl  Home Medications   Current Outpatient Rx  Name Route Sig Dispense Refill  . ACETAMINOPHEN 500 MG PO TABS Oral Take 500 mg by mouth every 6 (six) hours as needed. For pain    . DIPHENHYDRAMINE HCL 25 MG PO CAPS Oral Take 50 mg by mouth every 6 (six) hours as needed. For allergies    . HYDROCORTISONE EX Apply externally  Apply 1 application topically daily as needed. For itching    . IBUPROFEN 200 MG PO TABS Oral Take 800 mg by mouth every 6 (six) hours as needed. For pain      BP 124/74  Pulse 82  Temp 99.4 F (37.4 C) (Oral)  Resp 20  SpO2 98%  LMP 05/25/2012  Physical Exam  Vitals reviewed. Constitutional: She is oriented to person, place, and time. She appears well-developed and well-nourished. No distress.  HENT:  Head: Normocephalic and atraumatic.       Wearing a wig and long fake eyelashes.   Eyes: Conjunctivae are normal. Pupils are equal, round, and reactive to light.  Cardiovascular: Normal rate, regular rhythm and normal heart sounds.   Pulmonary/Chest: Effort normal and breath sounds normal.  Abdominal: Soft. Bowel sounds are normal. She exhibits no distension. There is no tenderness. There is no rebound and no guarding. Hernia confirmed negative in the right inguinal area and confirmed negative in the left inguinal area.       Obese with protuberant abdomen  Genitourinary: Rectum normal, vagina normal and uterus normal. Pelvic exam was performed with patient supine. There is no rash on the right labia. There is no rash on the left labia. Cervix exhibits discharge. Cervix exhibits no motion tenderness. Right adnexum displays no mass and no tenderness. Left  adnexum displays no mass and no tenderness.       White mucous-like discharge  Neurological: She is alert and oriented to person, place, and time.  Skin: She is not diaphoretic.    ED Course  Procedures (including critical care time)  Labs Reviewed  URINALYSIS, ROUTINE W REFLEX MICROSCOPIC - Abnormal; Notable for the following:    Hgb urine dipstick MODERATE (*)     Leukocytes, UA SMALL (*)     All other components within normal limits  URINE MICROSCOPIC-ADD ON - Abnormal; Notable for the following:    Bacteria, UA FEW (*)     All other components within normal limits  CBC - Abnormal; Notable for the following:    Hemoglobin  11.8 (*)     HCT 35.8 (*)     All other components within normal limits  WET PREP, GENITAL - Abnormal; Notable for the following:    Clue Cells Wet Prep HPF POC FEW (*)     WBC, Wet Prep HPF POC MODERATE (*)     All other components within normal limits  PREGNANCY, URINE  COMPREHENSIVE METABOLIC PANEL  GC/CHLAMYDIA PROBE AMP, GENITAL  RPR  HIV ANTIBODY (ROUTINE TESTING)   No results found.   1. Bacterial vaginosis   2. Abdominal pain       MDM  31 year old female with mild subacute abdominal pain and vagina discharge after unprotected sex with a one-time partner was concerning for an STI. Her exam was unremarkable except for a mucous-like discharge. She was BV positive on wet prep and discharged with metronidazole gel, since she is allergic to Flagyl tablets and clindamycin tabs. She was not treated presumptively for GC/Chlamydia, but will be alerted if the results are positive. She was encouraged to follow up with Dr. Gaynell Face, her OB-GYN since this needs follow-up. She was in agreement with the plan.         Garnetta Buddy, MD 06/20/12 [phone]

## 2012-06-20 NOTE — ED Notes (Signed)
Lower abdominal pain x 3 weeks. States pain feels like menstrual cramps.

## 2012-06-20 NOTE — ED Provider Notes (Signed)
I  reviewed the resident's note and I agree with the findings and plan.    Nelia Shi, MD 06/20/12 1910

## 2012-06-21 LAB — HIV ANTIBODY (ROUTINE TESTING W REFLEX): HIV: NONREACTIVE

## 2012-06-21 LAB — GC/CHLAMYDIA PROBE AMP, GENITAL: GC Probe Amp, Genital: NEGATIVE

## 2012-06-21 LAB — RPR: RPR Ser Ql: NONREACTIVE

## 2014-10-01 ENCOUNTER — Encounter (HOSPITAL_BASED_OUTPATIENT_CLINIC_OR_DEPARTMENT_OTHER): Payer: Self-pay | Admitting: *Deleted

## 2014-10-31 ENCOUNTER — Inpatient Hospital Stay (HOSPITAL_COMMUNITY)
Admission: AD | Admit: 2014-10-31 | Discharge: 2014-10-31 | Disposition: A | Discharge status: Home / Self Care | Payer: Medicaid Other | Source: Ambulatory Visit | Attending: Obstetrics & Gynecology | Admitting: Obstetrics & Gynecology

## 2014-10-31 ENCOUNTER — Telehealth: Payer: Self-pay | Admitting: *Deleted

## 2014-10-31 ENCOUNTER — Encounter (HOSPITAL_COMMUNITY): Payer: Self-pay

## 2014-10-31 DIAGNOSIS — N949 Unspecified condition associated with female genital organs and menstrual cycle: Secondary | ICD-10-CM | POA: Insufficient documentation

## 2014-10-31 DIAGNOSIS — R102 Pelvic and perineal pain: Secondary | ICD-10-CM

## 2014-10-31 DIAGNOSIS — Z3202 Encounter for pregnancy test, result negative: Secondary | ICD-10-CM | POA: Diagnosis not present

## 2014-10-31 LAB — URINALYSIS, ROUTINE W REFLEX MICROSCOPIC
Bilirubin Urine: NEGATIVE
GLUCOSE, UA: NEGATIVE mg/dL
Hgb urine dipstick: NEGATIVE
KETONES UR: NEGATIVE mg/dL
LEUKOCYTES UA: NEGATIVE
Nitrite: NEGATIVE
PROTEIN: NEGATIVE mg/dL
Specific Gravity, Urine: 1.02 (ref 1.005–1.030)
Urobilinogen, UA: 0.2 mg/dL (ref 0.0–1.0)
pH: 6 (ref 5.0–8.0)

## 2014-10-31 LAB — WET PREP, GENITAL
CLUE CELLS WET PREP: NONE SEEN
Trich, Wet Prep: NONE SEEN
Yeast Wet Prep HPF POC: NONE SEEN

## 2014-10-31 LAB — POCT PREGNANCY, URINE: PREG TEST UR: NEGATIVE

## 2014-10-31 MED ORDER — IBUPROFEN 600 MG PO TABS: 600.0000 mg | ORAL_TABLET | Freq: Four times a day (QID) | ORAL | Status: DC | PRN

## 2014-10-31 NOTE — Telephone Encounter (Signed)
Contacted patient, discussed pain and recommendation from D. Poe CNM.  Pt will call back if pain persist.  Signs and symptoms given that patient may need to be seen sooner or in MAU.  Pt verbalizes understanding.

## 2014-10-31 NOTE — MAU Note (Signed)
Pt presents complaining of a sudden onset of pain in her vagina that radiates to her lower abdomen and lower back. Denies vaginal bleeding or discharge. Last intercourse in October. Denies any rashes or boils. Pt states she had full physical with pelvic exam with pap smear and STD testing and denies any problems.

## 2014-10-31 NOTE — MAU Provider Note (Signed)
CC: Vaginal Pain    First Provider Initiated Contact with Patient 10/31/14 1322      HPI Lauren Garza is a 33 y.o. [phone] who presents with onset today of sharp shooting pain localized to her vagina described as "cramping" of the vagina. The pain radiates to suprapubic region. It is constant but waxes and wanes. Onset while sitting at work. Last intercourse October. Had mild suprapubic pain with urination once this morning. No other dysuria, urgency frequency, fever, chills, nausea vomiting, back pain. No previous similar episode. LMP 10/13/2014.  Denies abnormal bleeding States she had neg HIV and other STD tests at physical done about 2 wks ago at [facility]. Wants work excuse.  Past Medical History  Diagnosis Date  . Abnormal Pap smear   . Chlamydia trachomatis infection 2012  . Bacterial vaginosis     OB History  Gravida Para Term Preterm AB SAB TAB Ectopic Multiple Living  3 3 3  0 0 0 0 0 0 3    # Outcome Date GA Lbr Len/2nd Weight Sex Delivery Anes PTL Lv  3 Term           2 Term           1 Term               Past Surgical History  Procedure Laterality Date  . Gynecologic cryosurgery    . Tubal ligation      History   Social History  . Marital Status: Legally Separated    Spouse Name: N/A    Number of Children: N/A  . Years of Education: N/A   Occupational History  . Not on file.   Social History Main Topics  . Smoking status: Never Smoker   . Smokeless tobacco: Not on file  . Alcohol Use: Yes  . Drug Use: No  . Sexual Activity: Yes    Birth Control/ Protection: Surgical   Other Topics Concern  . Not on file   Social History Narrative    No current facility-administered medications on file prior to encounter.   Current Outpatient Prescriptions on File Prior to Encounter  Medication Sig Dispense Refill  . ibuprofen (ADVIL,MOTRIN) 200 MG tablet Take 800 mg by mouth every 6 (six) hours as needed. For pain    . acetaminophen (TYLENOL) 500  MG tablet Take 500 mg by mouth every 6 (six) hours as needed. For pain    . diphenhydrAMINE (BENADRYL) 25 mg capsule Take 50 mg by mouth every 6 (six) hours as needed. For allergies    . HYDROCORTISONE EX Apply 1 application topically daily as needed. For itching      Allergies  Allergen Reactions  . Clindamycin/Lincomycin Hives  . Flagyl [Metronidazole Hcl] Hives    ROS Pertinent items in HPI  PHYSICAL EXAM Filed Vitals:   10/31/14 1159  BP: 119/83  Pulse: 78  Temp: 98.1 F (36.7 C)  Resp: 18   General: Obese female in no acute distress Cardiovascular: Normal rate Respiratory: Normal effort Abdomen: Soft, tender in suprapubic region only Back: No CVAT Extremities: No edema Neurologic: Alert and oriented Speculum exam: NEFG; vagina with physiologic discharge, no blood; cervix clean Bimanual exam: cervix closed, minimal CMT; uterus tender NSSP; no adnexal tenderness or masses   LAB RESULTS Results for orders placed or performed during the hospital encounter of 10/31/14 (from the past 24 hour(s))  Urinalysis, Routine w reflex microscopic     Status: None   Collection Time: 10/31/14 11:45 AM  Result Value Ref Range   Color, Urine YELLOW YELLOW   APPearance CLEAR CLEAR   Specific Gravity, Urine 1.020 1.005 - 1.030   pH 6.0 5.0 - 8.0   Glucose, UA NEGATIVE NEGATIVE mg/dL   Hgb urine dipstick NEGATIVE NEGATIVE   Bilirubin Urine NEGATIVE NEGATIVE   Ketones, ur NEGATIVE NEGATIVE mg/dL   Protein, ur NEGATIVE NEGATIVE mg/dL   Urobilinogen, UA 0.2 0.0 - 1.0 mg/dL   Nitrite NEGATIVE NEGATIVE   Leukocytes, UA NEGATIVE NEGATIVE  Pregnancy, urine POC     Status: None   Collection Time: 10/31/14 11:54 AM  Result Value Ref Range   Preg Test, Ur NEGATIVE NEGATIVE  Wet prep, genital     Status: Abnormal   Collection Time: 10/31/14  1:59 PM  Result Value Ref Range   Yeast Wet Prep HPF POC NONE SEEN NONE SEEN   Trich, Wet Prep NONE SEEN NONE SEEN   Clue Cells Wet Prep HPF POC  NONE SEEN NONE SEEN   WBC, Wet Prep HPF POC MODERATE (A) NONE SEEN    IMAGING No results found.  MAU COURSE GC/CT sent  ASSESSMENT  1. Pelvic pain in female     PLAN Discharge home. See AVS for patient education. Discussed possible etiologies and that pain may respond to ibuprofen or be self-limited. If persists, F/U [facility]> note for staff to schedule OP [place] and GYN viist   Medication List    TAKE these medications        acetaminophen 500 MG tablet  Commonly known as:  TYLENOL  Take 500 mg by mouth every 6 (six) hours as needed. For pain     diphenhydrAMINE 25 mg capsule  Commonly known as:  BENADRYL  Take 50 mg by mouth every 6 (six) hours as needed. For allergies     HYDROCORTISONE EX  Apply 1 application topically daily as needed. For itching     ibuprofen 600 MG tablet  Commonly known as:  ADVIL,MOTRIN  Take 1 tablet (600 mg total) by mouth every 6 (six) hours as needed.      Work note for today Follow-up Information    Follow up with [facility].   Why:  Someone from Clinic will call you with appt.   Contact information:   [place] [address] [phone]        Lorene Dy, CNM 10/31/2014 1:57 PM

## 2014-10-31 NOTE — Discharge Instructions (Signed)
Pelvic Pain Female pelvic pain can be caused by many different things and start from a variety of places. Pelvic pain refers to pain that is located in the lower half of the abdomen and between your hips. The pain may occur over a short period of time (acute) or may be reoccurring (chronic). The cause of pelvic pain may be related to disorders affecting the female reproductive organs (gynecologic), but it may also be related to the bladder, kidney stones, an intestinal complication, or muscle or skeletal problems. Getting help right away for pelvic pain is important, especially if there has been severe, sharp, or a sudden onset of unusual pain. It is also important to get help right away because some types of pelvic pain can be life threatening.  CAUSES  Below are only some of the causes of pelvic pain. The causes of pelvic pain can be in one of several categories.   Gynecologic.  Pelvic inflammatory disease.  Sexually transmitted infection.  Ovarian cyst or a twisted ovarian ligament (ovarian torsion).  Uterine lining that grows outside the uterus (endometriosis).  Fibroids, cysts, or tumors.  Ovulation.  Pregnancy.  Pregnancy that occurs outside the uterus (ectopic pregnancy).  Miscarriage.  Labor.  Abruption of the placenta or ruptured uterus.  Infection.  Uterine infection (endometritis).  Bladder infection.  Diverticulitis.  Miscarriage related to a uterine infection (septic abortion).  Bladder.  Inflammation of the bladder (cystitis).  Kidney stone(s).  Gastrointestinal.  Constipation.  Diverticulitis.  Neurologic.  Trauma.  Feeling pelvic pain because of mental or emotional causes (psychosomatic).  Cancers of the bowel or pelvis. EVALUATION  Your caregiver will want to take a careful history of your concerns. This includes recent changes in your health, a careful gynecologic history of your periods (menses), and a sexual history. Obtaining your family  history and medical history is also important. Your caregiver may suggest a pelvic exam. A pelvic exam will help identify the location and severity of the pain. It also helps in the evaluation of which organ system may be involved. In order to identify the cause of the pelvic pain and be properly treated, your caregiver may order tests. These tests may include:   A pregnancy test.  Pelvic ultrasonography.  An X-ray exam of the abdomen.  A urinalysis or evaluation of vaginal discharge.  Blood tests. HOME CARE INSTRUCTIONS   Only take over-the-counter or prescription medicines for pain, discomfort, or fever as directed by your caregiver.   Rest as directed by your caregiver.   Eat a balanced diet.   Drink enough fluids to make your urine clear or pale yellow, or as directed.   Avoid sexual intercourse if it causes pain.   Apply warm or cold compresses to the lower abdomen depending on which one helps the pain.   Avoid stressful situations.   Keep a journal of your pelvic pain. Write down when it started, where the pain is located, and if there are things that seem to be associated with the pain, such as food or your menstrual cycle.  Follow up with your caregiver as directed.  SEEK MEDICAL CARE IF:  Your medicine does not help your pain.  You have abnormal vaginal discharge. SEEK IMMEDIATE MEDICAL CARE IF:   You have heavy bleeding from the vagina.   Your pelvic pain increases.   You feel light-headed or faint.   You have chills.   You have pain with urination or blood in your urine.   You have uncontrolled diarrhea   or vomiting.   You have a fever or persistent symptoms for more than 3 days.  You have a fever and your symptoms suddenly get worse.   You are being physically or sexually abused.  MAKE SURE YOU:  Understand these instructions.  Will watch your condition.  Will get help if you are not doing well or get worse. Document Released:  10/13/2004 Document Revised: 04/02/2014 Document Reviewed: 03/07/2012 ExitCare Patient Information 2015 [employer]. This information is not intended to replace advice given to you by your health care provider. Make sure you discuss any questions you have with your health care provider.  

## 2014-10-31 NOTE — MAU Note (Signed)
Urine in lab 

## 2014-10-31 NOTE — Telephone Encounter (Signed)
-----   Message from Lorene Dy, CNM sent at 10/31/2014  2:34 PM EST ----- 1 d of pelvic pain. Call in a week and if persists, offer next available appointment with pelvic US before being seen

## 2014-11-01 ENCOUNTER — Other Ambulatory Visit: Payer: Self-pay | Admitting: *Deleted

## 2014-11-01 DIAGNOSIS — R102 Pelvic and perineal pain: Secondary | ICD-10-CM

## 2014-11-01 LAB — GC/CHLAMYDIA PROBE AMP
CT PROBE, AMP APTIMA: NEGATIVE
GC PROBE AMP APTIMA: NEGATIVE

## 2014-11-09 ENCOUNTER — Ambulatory Visit (HOSPITAL_COMMUNITY)
Admission: RE | Admit: 2014-11-09 | Discharge: 2014-11-09 | Disposition: A | Discharge status: Home / Self Care | Payer: Medicaid Other | Source: Ambulatory Visit | Attending: Obstetrics & Gynecology | Admitting: Obstetrics & Gynecology

## 2014-11-09 DIAGNOSIS — R102 Pelvic and perineal pain: Secondary | ICD-10-CM

## 2014-11-09 DIAGNOSIS — D252 Subserosal leiomyoma of uterus: Secondary | ICD-10-CM | POA: Diagnosis not present

## 2014-11-09 DIAGNOSIS — N949 Unspecified condition associated with female genital organs and menstrual cycle: Secondary | ICD-10-CM | POA: Diagnosis present

## 2014-11-09 DIAGNOSIS — D251 Intramural leiomyoma of uterus: Secondary | ICD-10-CM | POA: Insufficient documentation

## 2014-11-13 ENCOUNTER — Telehealth: Payer: Self-pay

## 2014-11-13 DIAGNOSIS — R102 Pelvic and perineal pain: Secondary | ICD-10-CM

## 2014-11-13 NOTE — Telephone Encounter (Signed)
Per Dr. Elly Modena, schedule a follow up U/S 4-6 weeks and then clinic appointment to discuss results afterwards. Follow up U/S scheduled for 12/14/14 at 1015. Called patient and informed her of results and recommendations. Patient states she is currently on her menses. With that, 12/14/14 will likely be just when her menses is ending next month. Per U/S report it is recommended U/S be completed one week after menses. Informed patient to call radiology [phone] when she can take a look at her calendar and reschedule appointment to week 12/17/14. Informed patient we will then schedule her a clinic appointment for the week following (she will be called by front office staff with appointment). Patient verbalized understanding and gratitude. No questions or concerns. Message sent to admin pool to call patient with GYN appointment.

## 2014-11-13 NOTE — Telephone Encounter (Signed)
-----   Message from Vance Peper, [place] sent at 11/09/2014 11:03 AM EST ----- Regarding: [place] GYN Results We have just completed a pelvic outpatient ultrasound scheduled for a patient who was seen in MAU.  Please call the patient with the results.

## 2014-12-12 ENCOUNTER — Encounter: Payer: Medicaid Other | Admitting: Obstetrics & Gynecology

## 2014-12-14 ENCOUNTER — Ambulatory Visit (HOSPITAL_COMMUNITY)
Admission: RE | Admit: 2014-12-14 | Discharge: 2014-12-14 | Disposition: A | Discharge status: Home / Self Care | Payer: Medicaid Other | Source: Ambulatory Visit | Attending: Obstetrics and Gynecology | Admitting: Obstetrics and Gynecology

## 2014-12-14 DIAGNOSIS — R102 Pelvic and perineal pain: Secondary | ICD-10-CM | POA: Insufficient documentation

## 2014-12-26 ENCOUNTER — Telehealth: Payer: Self-pay | Admitting: *Deleted

## 2014-12-26 NOTE — Telephone Encounter (Signed)
Pt left message requesting to be called with her [place] results.  Per chart review, pt has scheduled appt on 2/3 for this purpose per request from Dr. Ihor Dow

## 2014-12-27 NOTE — Telephone Encounter (Signed)
Called patient and gave her ultrasound results. Advised her to keep appointment for next week. Patient had no further questions.

## 2015-01-02 ENCOUNTER — Ambulatory Visit (INDEPENDENT_AMBULATORY_CARE_PROVIDER_SITE_OTHER): Payer: Medicaid Other | Admitting: Obstetrics & Gynecology

## 2015-01-02 ENCOUNTER — Encounter: Payer: Self-pay | Admitting: Obstetrics & Gynecology

## 2015-01-02 VITALS — BP 120/65 | HR 82 | Temp 98.3°F | Ht 62.0 in | Wt 233.5 lb

## 2015-01-02 DIAGNOSIS — R102 Pelvic and perineal pain: Secondary | ICD-10-CM

## 2015-01-02 DIAGNOSIS — D251 Intramural leiomyoma of uterus: Secondary | ICD-10-CM

## 2015-01-02 DIAGNOSIS — D219 Benign neoplasm of connective and other soft tissue, unspecified: Secondary | ICD-10-CM | POA: Insufficient documentation

## 2015-01-02 NOTE — Patient Instructions (Signed)

## 2015-01-02 NOTE — Progress Notes (Signed)
Subjective:     Patient ID: Lauren Garza, female   DOB: 22-Dec-1980, 34 y.o.   MRN: [id number]  HPI  [id number] Patient's last menstrual period was 12/10/2014. Visit to [facility] 11/01/14 for occasion brief sharp low abd pain. [place] was done and question of endometrium abnormality and fibroid led to f/u scan 12/14/14. Pain not related to menses and   Review of Systems  Constitutional: Negative.   Gastrointestinal: Positive for abdominal pain.  Endocrine: Positive for polyphagia.  Genitourinary: Positive for pelvic pain (pain shoots to vagina). Negative for vaginal bleeding and vaginal discharge.       Objective: Blood pressure 120/65, pulse 82, temperature 98.3 F (36.8 C), temperature source Oral, height 5\' 2"  (1.575 m), weight 233 lb 8 oz (105.915 kg), last menstrual period 12/10/2014.    Physical Exam  Constitutional: She is oriented to person, place, and time. She appears well-developed. No distress.  Pulmonary/Chest: Effort normal.  Neurological: She is alert and oriented to person, place, and time.  Skin: Skin is warm and dry.  Psychiatric: She has a normal mood and affect. Her behavior is normal.  Vitals reviewed.       CLINICAL DATA: Pelvic pain in female. Followup endometrial thickening and possible focal endometrial lesion.  EXAM: ULTRASOUND PELVIS TRANSVAGINAL  TECHNIQUE: Transvaginal ultrasound examination of the pelvis was performed including evaluation of the uterus, ovaries, adnexal regions, and pelvic cul-de-sac.  COMPARISON: 11/09/2014  FINDINGS: Uterus  Measurements: 9.3 x 5.0 x 6.0 cm. Unsure all fibroid is again seen in the right lateral corpus measuring 3.1 cm in maximum diameter. Heterogeneous myometrial echotexture noted. Tiny less than 1 cm anterior fundal fibroid also noted.  Endometrium  Thickness: 5 mm, which is decreased since previous exam. No focal abnormality visualized.  Right ovary  Measurements: 2.7 x 4.8 x 2.2 cm. Normal  appearance/no adnexal mass.  Left ovary  Measurements: 2.6 x 1.7 x 1.7 cm. Normal appearance/no adnexal mass.  Other findings: No free fluid  IMPRESSION: Small uterine fibroids, without change.  Normal endometrial thickness measuring 5 mm on today's exam.  Normal appearance of both ovaries.   Electronically Signed  By: Earle Gell M.D.  On: 12/14/2014 11:46       Assessment:     Abd/pelvic pain sounds more MS origin. Small fibroids     Plan:     Information on fibroid. Pap is up to date. Usually sees dr. Melody Comas, MD 01/02/2015

## 2015-04-04 ENCOUNTER — Encounter (HOSPITAL_COMMUNITY)
Admission: EM | Disposition: A | Discharge status: Home / Self Care | Payer: Self-pay | Source: Home / Self Care | Attending: Internal Medicine

## 2015-04-04 ENCOUNTER — Encounter (HOSPITAL_BASED_OUTPATIENT_CLINIC_OR_DEPARTMENT_OTHER): Payer: Self-pay | Admitting: Emergency Medicine

## 2015-04-04 ENCOUNTER — Inpatient Hospital Stay (HOSPITAL_BASED_OUTPATIENT_CLINIC_OR_DEPARTMENT_OTHER)
Admission: EM | Admit: 2015-04-04 | Discharge: 2015-04-06 | DRG: 550 | Disposition: A | Discharge status: Home / Self Care | Payer: Medicaid Other | Attending: Internal Medicine | Admitting: Internal Medicine

## 2015-04-04 ENCOUNTER — Inpatient Hospital Stay (HOSPITAL_COMMUNITY): Payer: Medicaid Other | Admitting: Anesthesiology

## 2015-04-04 ENCOUNTER — Emergency Department (HOSPITAL_BASED_OUTPATIENT_CLINIC_OR_DEPARTMENT_OTHER): Payer: Medicaid Other

## 2015-04-04 DIAGNOSIS — N760 Acute vaginitis: Secondary | ICD-10-CM | POA: Diagnosis present

## 2015-04-04 DIAGNOSIS — Z87891 Personal history of nicotine dependence: Secondary | ICD-10-CM | POA: Diagnosis not present

## 2015-04-04 DIAGNOSIS — M009 Pyogenic arthritis, unspecified: Secondary | ICD-10-CM | POA: Diagnosis present

## 2015-04-04 DIAGNOSIS — A5442 Gonococcal arthritis: Principal | ICD-10-CM | POA: Diagnosis present

## 2015-04-04 DIAGNOSIS — A719 Trachoma, unspecified: Secondary | ICD-10-CM | POA: Diagnosis present

## 2015-04-04 DIAGNOSIS — A419 Sepsis, unspecified organism: Secondary | ICD-10-CM

## 2015-04-04 DIAGNOSIS — B9689 Other specified bacterial agents as the cause of diseases classified elsewhere: Secondary | ICD-10-CM

## 2015-04-04 HISTORY — PX: I & D EXTREMITY: SHX5045

## 2015-04-04 LAB — WET PREP, GENITAL
Trich, Wet Prep: NONE SEEN
YEAST WET PREP: NONE SEEN

## 2015-04-04 LAB — CBC WITH DIFFERENTIAL/PLATELET
Basophils Absolute: 0 10*3/uL (ref 0.0–0.1)
Basophils Relative: 0 % (ref 0–1)
EOS PCT: 4 % (ref 0–5)
Eosinophils Absolute: 0.5 10*3/uL (ref 0.0–0.7)
HCT: 35.3 % — ABNORMAL LOW (ref 36.0–46.0)
Hemoglobin: 11.2 g/dL — ABNORMAL LOW (ref 12.0–15.0)
LYMPHS ABS: 2.5 10*3/uL (ref 0.7–4.0)
Lymphocytes Relative: 21 % (ref 12–46)
MCH: 25.9 pg — ABNORMAL LOW (ref 26.0–34.0)
MCHC: 31.7 g/dL (ref 30.0–36.0)
MCV: 81.5 fL (ref 78.0–100.0)
Monocytes Absolute: 1.1 10*3/uL — ABNORMAL HIGH (ref 0.1–1.0)
Monocytes Relative: 9 % (ref 3–12)
Neutro Abs: 7.8 10*3/uL — ABNORMAL HIGH (ref 1.7–7.7)
Neutrophils Relative %: 66 % (ref 43–77)
Platelets: 343 10*3/uL (ref 150–400)
RBC: 4.33 MIL/uL (ref 3.87–5.11)
RDW: 15.1 % (ref 11.5–15.5)
WBC: 11.9 10*3/uL — ABNORMAL HIGH (ref 4.0–10.5)

## 2015-04-04 LAB — COMPREHENSIVE METABOLIC PANEL
ALBUMIN: 3.4 g/dL — AB (ref 3.5–5.0)
ALT: 33 U/L (ref 14–54)
AST: 31 U/L (ref 15–41)
Alkaline Phosphatase: 82 U/L (ref 38–126)
Anion gap: 6 (ref 5–15)
BUN: 11 mg/dL (ref 6–20)
CALCIUM: 9.1 mg/dL (ref 8.9–10.3)
CO2: 30 mmol/L (ref 22–32)
CREATININE: 0.77 mg/dL (ref 0.44–1.00)
Chloride: 101 mmol/L (ref 101–111)
GFR calc Af Amer: >60 mL/min (ref >60)
Glucose, Bld: 89 mg/dL (ref 70–99)
Potassium: 3.9 mmol/L (ref 3.5–5.1)
Sodium: 137 mmol/L (ref 135–145)
Total Bilirubin: 0.4 mg/dL (ref 0.3–1.2)
Total Protein: 7.7 g/dL (ref 6.5–8.1)

## 2015-04-04 LAB — URINALYSIS, ROUTINE W REFLEX MICROSCOPIC
BILIRUBIN URINE: NEGATIVE
Glucose, UA: NEGATIVE mg/dL
Hgb urine dipstick: NEGATIVE
Ketones, ur: NEGATIVE mg/dL
Nitrite: NEGATIVE
PH: 6 (ref 5.0–8.0)
Protein, ur: NEGATIVE mg/dL
Specific Gravity, Urine: 1.021 (ref 1.005–1.030)
Urobilinogen, UA: 0.2 mg/dL (ref 0.0–1.0)

## 2015-04-04 LAB — PREGNANCY, URINE: Preg Test, Ur: NEGATIVE

## 2015-04-04 LAB — URINE MICROSCOPIC-ADD ON

## 2015-04-04 LAB — SEDIMENTATION RATE: Sed Rate: 61 mm/hr — ABNORMAL HIGH (ref 0–22)

## 2015-04-04 SURGERY — IRRIGATION AND DEBRIDEMENT EXTREMITY
Anesthesia: General | Site: Wrist | Laterality: Right

## 2015-04-04 MED ORDER — VANCOMYCIN HCL 1000 MG IV SOLR
1000.0000 mg | INTRAVENOUS | Status: DC | PRN
  Administered 2015-04-04: 1000 mg via INTRAVENOUS

## 2015-04-04 MED ORDER — ONDANSETRON HCL 4 MG/2ML IJ SOLN
4.0000 mg | Freq: Once | INTRAMUSCULAR | Status: AC
  Administered 2015-04-04: 4 mg via INTRAVENOUS
  Filled 2015-04-04: qty 2

## 2015-04-04 MED ORDER — ONDANSETRON HCL 4 MG/2ML IJ SOLN
4.0000 mg | Freq: Once | INTRAMUSCULAR | Status: AC
  Administered 2015-04-04: 4 mg via INTRAVENOUS

## 2015-04-04 MED ORDER — CLOTRIMAZOLE 2 % VA CREA
1.0000 | TOPICAL_CREAM | Freq: Every day | VAGINAL | Status: DC
  Administered 2015-04-05: 1 via VAGINAL
  Filled 2015-04-04 (×2): qty 21

## 2015-04-04 MED ORDER — VANCOMYCIN HCL 1000 MG IV SOLR
INTRAVENOUS | Status: AC
  Filled 2015-04-04: qty 1000

## 2015-04-04 MED ORDER — PROPOFOL 10 MG/ML IV BOLUS
INTRAVENOUS | Status: DC | PRN
  Administered 2015-04-04: 200 mg via INTRAVENOUS

## 2015-04-04 MED ORDER — OXYCODONE-ACETAMINOPHEN 5-325 MG PO TABS
1.0000 | ORAL_TABLET | Freq: Once | ORAL | Status: DC
  Filled 2015-04-04 (×2): qty 1

## 2015-04-04 MED ORDER — CEFTRIAXONE SODIUM IN DEXTROSE 20 MG/ML IV SOLN
1.0000 g | INTRAVENOUS | Status: DC
  Administered 2015-04-05 – 2015-04-06 (×2): 1 g via INTRAVENOUS
  Filled 2015-04-04 (×2): qty 50

## 2015-04-04 MED ORDER — LIDOCAINE HCL (CARDIAC) 20 MG/ML IV SOLN
INTRAVENOUS | Status: DC | PRN
  Administered 2015-04-04: 100 mg via INTRAVENOUS

## 2015-04-04 MED ORDER — LACTATED RINGERS IV SOLN
INTRAVENOUS | Status: DC | PRN
  Administered 2015-04-04: 22:00:00 via INTRAVENOUS

## 2015-04-04 MED ORDER — METRONIDAZOLE 0.75 % VA GEL
1.0000 | Freq: Every day | VAGINAL | Status: DC
  Administered 2015-04-05: 1 via VAGINAL
  Filled 2015-04-04 (×2): qty 70

## 2015-04-04 MED ORDER — MIDAZOLAM HCL 2 MG/2ML IJ SOLN
INTRAMUSCULAR | Status: AC
  Filled 2015-04-04: qty 2

## 2015-04-04 MED ORDER — BUPIVACAINE HCL (PF) 0.25 % IJ SOLN
INTRAMUSCULAR | Status: DC | PRN
  Administered 2015-04-04: 10 mL

## 2015-04-04 MED ORDER — MIDAZOLAM HCL 2 MG/2ML IJ SOLN
INTRAMUSCULAR | Status: DC | PRN
  Administered 2015-04-04: 2 mg via INTRAVENOUS

## 2015-04-04 MED ORDER — HYDROMORPHONE HCL 1 MG/ML IJ SOLN: 0.2500 mg | INTRAMUSCULAR | Status: DC | PRN

## 2015-04-04 MED ORDER — ONDANSETRON HCL 4 MG/2ML IJ SOLN
INTRAMUSCULAR | Status: AC
  Filled 2015-04-04: qty 2

## 2015-04-04 MED ORDER — VANCOMYCIN HCL IN DEXTROSE 1-5 GM/200ML-% IV SOLN
1000.0000 mg | Freq: Three times a day (TID) | INTRAVENOUS | Status: DC
  Administered 2015-04-05 – 2015-04-06 (×5): 1000 mg via INTRAVENOUS
  Filled 2015-04-04 (×8): qty 200

## 2015-04-04 MED ORDER — PROMETHAZINE HCL 25 MG/ML IJ SOLN: 6.2500 mg | INTRAMUSCULAR | Status: DC | PRN

## 2015-04-04 MED ORDER — FENTANYL CITRATE (PF) 250 MCG/5ML IJ SOLN
INTRAMUSCULAR | Status: AC
  Filled 2015-04-04: qty 5

## 2015-04-04 MED ORDER — SODIUM CHLORIDE 0.9 % IV BOLUS (SEPSIS)
1000.0000 mL | Freq: Once | INTRAVENOUS | Status: AC
  Administered 2015-04-04: 1000 mL via INTRAVENOUS

## 2015-04-04 MED ORDER — HYDROMORPHONE HCL 1 MG/ML IJ SOLN
1.0000 mg | Freq: Once | INTRAMUSCULAR | Status: AC
Start: 2015-04-04 — End: 2015-04-04
  Administered 2015-04-04: 1 mg via INTRAVENOUS
  Filled 2015-04-04: qty 1

## 2015-04-04 MED ORDER — HYDROMORPHONE HCL 1 MG/ML IJ SOLN
1.0000 mg | Freq: Once | INTRAMUSCULAR | Status: AC
  Administered 2015-04-04: 1 mg via INTRAVENOUS
  Filled 2015-04-04: qty 1

## 2015-04-04 MED ORDER — ONDANSETRON 4 MG PO TBDP
4.0000 mg | ORAL_TABLET | Freq: Once | ORAL | Status: AC
  Administered 2015-04-04: 4 mg via ORAL
  Filled 2015-04-04: qty 1

## 2015-04-04 MED ORDER — PROPOFOL 10 MG/ML IV BOLUS
INTRAVENOUS | Status: AC
  Filled 2015-04-04: qty 20

## 2015-04-04 MED ORDER — LIDOCAINE HCL (PF) 1 % IJ SOLN
INTRAMUSCULAR | Status: AC
  Administered 2015-04-04: 0.9 mL
  Filled 2015-04-04: qty 5

## 2015-04-04 MED ORDER — SODIUM CHLORIDE 0.9 % IR SOLN
Status: DC | PRN
  Administered 2015-04-04: 1000 mL

## 2015-04-04 MED ORDER — ONDANSETRON 4 MG PO TBDP
4.0000 mg | ORAL_TABLET | Freq: Once | ORAL | Status: DC
  Filled 2015-04-04: qty 1

## 2015-04-04 MED ORDER — SUCCINYLCHOLINE CHLORIDE 20 MG/ML IJ SOLN
INTRAMUSCULAR | Status: DC | PRN
  Administered 2015-04-04: 120 mg via INTRAVENOUS

## 2015-04-04 MED ORDER — SODIUM CHLORIDE 0.9 % IV SOLN
INTRAVENOUS | Status: AC
  Administered 2015-04-04: 18:00:00 via INTRAVENOUS

## 2015-04-04 MED ORDER — MORPHINE SULFATE 4 MG/ML IJ SOLN
4.0000 mg | Freq: Once | INTRAMUSCULAR | Status: AC
Start: 2015-04-04 — End: 2015-04-04
  Administered 2015-04-04: 4 mg via INTRAVENOUS
  Filled 2015-04-04: qty 1

## 2015-04-04 MED ORDER — CEFTRIAXONE SODIUM 250 MG IJ SOLR
250.0000 mg | Freq: Once | INTRAMUSCULAR | Status: AC
  Administered 2015-04-04: 250 mg via INTRAMUSCULAR
  Filled 2015-04-04: qty 250

## 2015-04-04 MED ORDER — AZITHROMYCIN 250 MG PO TABS
1000.0000 mg | ORAL_TABLET | Freq: Once | ORAL | Status: AC
  Administered 2015-04-04: 1000 mg via ORAL
  Filled 2015-04-04: qty 4

## 2015-04-04 MED ORDER — ONDANSETRON HCL 4 MG/2ML IJ SOLN
INTRAMUSCULAR | Status: DC | PRN
  Administered 2015-04-04: 4 mg via INTRAVENOUS

## 2015-04-04 MED ORDER — FENTANYL CITRATE (PF) 250 MCG/5ML IJ SOLN
INTRAMUSCULAR | Status: DC | PRN
  Administered 2015-04-04: 100 ug via INTRAVENOUS
  Administered 2015-04-04: 50 ug via INTRAVENOUS

## 2015-04-04 MED ORDER — VANCOMYCIN HCL IN DEXTROSE 1-5 GM/200ML-% IV SOLN
INTRAVENOUS | Status: AC
  Filled 2015-04-04: qty 200

## 2015-04-04 MED ORDER — BUPIVACAINE HCL (PF) 0.25 % IJ SOLN
INTRAMUSCULAR | Status: AC
  Filled 2015-04-04: qty 30

## 2015-04-04 SURGICAL SUPPLY — 57 items
BANDAGE COBAN STERILE 2 (GAUZE/BANDAGES/DRESSINGS) IMPLANT
BANDAGE ELASTIC 3 VELCRO ST LF (GAUZE/BANDAGES/DRESSINGS) ×2 IMPLANT
BANDAGE ELASTIC 4 VELCRO ST LF (GAUZE/BANDAGES/DRESSINGS) ×2 IMPLANT
BNDG CMPR 9X4 STRL LF SNTH (GAUZE/BANDAGES/DRESSINGS)
BNDG COHESIVE 1X5 TAN STRL LF (GAUZE/BANDAGES/DRESSINGS) IMPLANT
BNDG CONFORM 2 STRL LF (GAUZE/BANDAGES/DRESSINGS) IMPLANT
BNDG ESMARK 4X9 LF (GAUZE/BANDAGES/DRESSINGS) IMPLANT
BNDG GAUZE ELAST 4 BULKY (GAUZE/BANDAGES/DRESSINGS) ×2 IMPLANT
CORDS BIPOLAR (ELECTRODE) ×2 IMPLANT
COVER SURGICAL LIGHT HANDLE (MISCELLANEOUS) ×2 IMPLANT
DECANTER SPIKE VIAL GLASS SM (MISCELLANEOUS) ×1 IMPLANT
DRAIN PENROSE 1/4X12 LTX STRL (WOUND CARE) IMPLANT
DRSG ADAPTIC 3X8 NADH LF (GAUZE/BANDAGES/DRESSINGS) IMPLANT
DRSG EMULSION OIL 3X3 NADH (GAUZE/BANDAGES/DRESSINGS) ×2 IMPLANT
DRSG PAD ABDOMINAL 8X10 ST (GAUZE/BANDAGES/DRESSINGS) ×3 IMPLANT
FACESHIELD STD STERILE (MASK) ×1 IMPLANT
GAUZE PACKING IODOFORM 1/4X15 (GAUZE/BANDAGES/DRESSINGS) ×1 IMPLANT
GAUZE SPONGE 4X4 12PLY STRL (GAUZE/BANDAGES/DRESSINGS) ×2 IMPLANT
GAUZE XEROFORM 1X8 LF (GAUZE/BANDAGES/DRESSINGS) ×2 IMPLANT
GLOVE BIO SURGEON STRL SZ7.5 (GLOVE) ×2 IMPLANT
GLOVE BIOGEL PI IND STRL 8 (GLOVE) ×1 IMPLANT
GLOVE BIOGEL PI INDICATOR 8 (GLOVE) ×1
GOWN STRL REUS W/ TWL LRG LVL3 (GOWN DISPOSABLE) ×1 IMPLANT
GOWN STRL REUS W/TWL LRG LVL3 (GOWN DISPOSABLE) ×4
KIT BASIN OR (CUSTOM PROCEDURE TRAY) ×2 IMPLANT
KIT ROOM TURNOVER OR (KITS) ×2 IMPLANT
LOOP VESSEL MAXI BLUE (MISCELLANEOUS) IMPLANT
LOOP VESSEL MINI RED (MISCELLANEOUS) IMPLANT
MANIFOLD NEPTUNE II (INSTRUMENTS) ×2 IMPLANT
NDL HYPO 25X1 1.5 SAFETY (NEEDLE) IMPLANT
NEEDLE HYPO 25X1 1.5 SAFETY (NEEDLE) IMPLANT
NS IRRIG 1000ML POUR BTL (IV SOLUTION) ×2 IMPLANT
PACK ORTHO EXTREMITY (CUSTOM PROCEDURE TRAY) ×2 IMPLANT
PAD ARMBOARD 7.5X6 YLW CONV (MISCELLANEOUS) ×4 IMPLANT
PAD CAST 4YDX4 CTTN HI CHSV (CAST SUPPLIES) IMPLANT
PADDING CAST COTTON 4X4 STRL (CAST SUPPLIES) ×2
SCRUB BETADINE 4OZ XXX (MISCELLANEOUS) ×1 IMPLANT
SET CYSTO W/LG BORE CLAMP LF (SET/KITS/TRAYS/PACK) ×1 IMPLANT
SOLUTION BETADINE 4OZ (MISCELLANEOUS) ×1 IMPLANT
SPLINT FIBERGLASS 3X12 (CAST SUPPLIES) ×1 IMPLANT
SPLINT PLASTER EXTRA FAST 3X15 (CAST SUPPLIES) ×1
SPLINT PLASTER GYPS XFAST 3X15 (CAST SUPPLIES) IMPLANT
SPONGE GAUZE 4X4 12PLY STER LF (GAUZE/BANDAGES/DRESSINGS) ×1 IMPLANT
SPONGE LAP 18X18 X RAY DECT (DISPOSABLE) ×1 IMPLANT
SPONGE LAP 4X18 X RAY DECT (DISPOSABLE) ×1 IMPLANT
SUCTION FRAZIER TIP 10 FR DISP (SUCTIONS) ×2 IMPLANT
SUT ETHILON 4 0 PS 2 18 (SUTURE) ×2 IMPLANT
SUT MON AB 5-0 P3 18 (SUTURE) IMPLANT
SYR CONTROL 10ML LL (SYRINGE) IMPLANT
TOWEL OR 17X24 6PK STRL BLUE (TOWEL DISPOSABLE) ×2 IMPLANT
TOWEL OR 17X26 10 PK STRL BLUE (TOWEL DISPOSABLE) ×2 IMPLANT
TUBE ANAEROBIC SPECIMEN COL (MISCELLANEOUS) IMPLANT
TUBE CONNECTING 12X1/4 (SUCTIONS) ×2 IMPLANT
TUBE FEEDING 5FR 15 INCH (TUBING) IMPLANT
UNDERPAD 30X30 INCONTINENT (UNDERPADS AND DIAPERS) ×2 IMPLANT
WATER STERILE IRR 1000ML POUR (IV SOLUTION) ×1 IMPLANT
YANKAUER SUCT BULB TIP NO VENT (SUCTIONS) ×2 IMPLANT

## 2015-04-04 NOTE — Anesthesia Preprocedure Evaluation (Addendum)
Anesthesia Evaluation  Patient identified by MRN, date of birth, ID band Patient awake    Reviewed: Allergy & Precautions, NPO status , Patient's Chart, lab work & pertinent test results  History of Anesthesia Complications Negative for: history of anesthetic complications  Airway Mallampati: II  TM Distance: >3 FB Neck ROM: Full    Dental  (+) Teeth Intact, Dental Advisory Given   Pulmonary former smoker,    Pulmonary exam normal       Cardiovascular negative cardio ROS Normal cardiovascular exam    Neuro/Psych negative neurological ROS  negative psych ROS   GI/Hepatic Neg liver ROS,   Endo/Other  negative endocrine ROSMorbid obesity  Renal/GU negative Renal ROS     Musculoskeletal   Abdominal   Peds  Hematology   Anesthesia Other Findings   Reproductive/Obstetrics                           Anesthesia Physical Anesthesia Plan  ASA: II and emergent  Anesthesia Plan: General   Post-op Pain Management:    Induction: Intravenous  Airway Management Planned: Oral ETT  Additional Equipment:   Intra-op Plan:   Post-operative Plan: Extubation in OR  Informed Consent: I have reviewed the patients History and Physical, chart, labs and discussed the procedure including the risks, benefits and alternatives for the proposed anesthesia with the patient or authorized representative who has indicated his/her understanding and acceptance.   Dental advisory given  Plan Discussed with: CRNA, Anesthesiologist and Surgeon  Anesthesia Plan Comments:        Anesthesia Quick Evaluation

## 2015-04-04 NOTE — Progress Notes (Signed)
ANTIBIOTIC CONSULT NOTE - INITIAL  Pharmacy Consult for Vancomycin, Rocephin Indication: osteomyelitis   Allergies  Allergen Reactions  . Clindamycin/Lincomycin Hives  . Flagyl [Metronidazole Hcl] Hives    Patient Measurements: Height: 5\' 2"  (157.5 cm) Weight: 227 lb 15.3 oz (103.4 kg) IBW/kg (Calculated) : 50.1   Vital Signs: Temp: 98.6 F (37 C) (05/05 2136) Temp Source: Oral (05/05 2136) BP: 130/86 mmHg (05/05 2136) Pulse Rate: 97 (05/05 2136)  Labs:  Recent Labs  04/04/15 1410  WBC 11.9*  HGB 11.2*  PLT 343  CREATININE 0.77   Estimated Creatinine Clearance: 114.8 mL/min (by C-G formula based on Cr of 0.77). No results for input(s): VANCOTROUGH, VANCOPEAK, VANCORANDOM, GENTTROUGH, GENTPEAK, GENTRANDOM, TOBRATROUGH, TOBRAPEAK, TOBRARND, AMIKACINPEAK, AMIKACINTROU, AMIKACIN in the last 72 hours.    Microbiology: Recent Results (from the past 720 hour(s))  Wet prep, genital     Status: Abnormal   Collection Time: 04/04/15  2:10 PM  Result Value Ref Range Status   Yeast Wet Prep HPF POC NONE SEEN NONE SEEN Final   Trich, Wet Prep NONE SEEN NONE SEEN Final   Clue Cells Wet Prep HPF POC MODERATE (A) NONE SEEN Final   WBC, Wet Prep HPF POC MANY (A) NONE SEEN Final    Medical History: Past Medical History  Diagnosis Date  . Abnormal Pap smear   . Chlamydia trachomatis infection 2012  . Bacterial vaginosis     Assessment: 34 year old female to begin Vancomycin and Rocephin for osteomyletitis  Goal of Therapy:  Vancomycin trough level 15-20 mcg/ml  Plan:  Rocephin 1 gram iv Q 24 hours starting tomorrow Vancomycin 1 gram iv Q 8 hours  Starting metrogel 3.33 % one applicator once a day for 5 days (also starting clotrimazole cream as well)  Thank you. Anette Guarneri, PharmD [phone]  04/04/2015,9:52 PM

## 2015-04-04 NOTE — Treatment Plan (Signed)
Called by Ms Verdene Rio  For patient Lauren Garza  Briefly 34 year old female presented to [facility] ED with right wrist joint inflammation, swelling, tender and right ankle swelling. Abnormal Pap with bacterial vaginosis, GC probe sent, concern for gonococcal arthritis versus polyarthritis of unknown etiology. No history of trauma.  Vital signs are stable  ED PA, Ms. Nyoka Cowden, consulted with the hand surgery, Dr. Fredna Dow who recommended patient stay NPO if patient needs surgery for the right wrist. Dr. Fredna Dow will evaluate the patient once patient arrives, please notify Dr. Fredna Dow. Orthopedic surgery will need to be called for right ankle swelling if needed.  Accepted to MedSurg floor   RAI,RIPUDEEP M.D. [facility] 04/04/2015, 5:45 PM  Pager: [phone]

## 2015-04-04 NOTE — ED Notes (Signed)
Pt in c/o pain and swelling to R hand. Was seen on Sunday and told it was carpal tunnel, was given anti-inflammatory and splint but pain and swelling persist. Tender to the touch.

## 2015-04-04 NOTE — Anesthesia Procedure Notes (Signed)
Procedure Name: Intubation Date/Time: 04/04/2015 10:39 PM Performed by: Valetta Fuller Pre-anesthesia Checklist: Patient identified, Emergency Drugs available, Suction available and Patient being monitored Patient Re-evaluated:Patient Re-evaluated prior to inductionOxygen Delivery Method: Circle system utilized Preoxygenation: Pre-oxygenation with 100% oxygen Intubation Type: IV induction, Rapid sequence and Cricoid Pressure applied Laryngoscope Size: Miller and 2 Grade View: Grade I Tube type: Oral Tube size: 7.0 mm Number of attempts: 1 Airway Equipment and Method: Stylet Placement Confirmation: ETT inserted through vocal cords under direct vision,  positive ETCO2 and breath sounds checked- equal and bilateral Secured at: 22 cm Tube secured with: Tape Dental Injury: Teeth and Oropharynx as per pre-operative assessment

## 2015-04-04 NOTE — Consult Note (Signed)
Lauren Garza is an 34 y.o. female.   Chief Complaint: right wrist pain HPI: 34 yo rhd female with pain in right wrist x four days.  No known injury.  No previous problem with wrist.  Pain and swelling have been worsening.  No fevers, chills, night sweats.  Past Medical History  Diagnosis Date  . Abnormal Pap smear   . Chlamydia trachomatis infection 2012  . Bacterial vaginosis     Past Surgical History  Procedure Laterality Date  . Gynecologic cryosurgery    . Tubal ligation    . Wisdom tooth extraction      Family History  Problem Relation Age of Onset  . Hypertension Mother    Social History:  reports that she has quit smoking. She has never used smokeless tobacco. She reports that she drinks alcohol. She reports that she does not use illicit drugs.  Allergies:  Allergies  Allergen Reactions  . Clindamycin/Lincomycin Hives  . Flagyl [Metronidazole Hcl] Hives    Medications Prior to Admission  Medication Sig Dispense Refill  . acetaminophen (TYLENOL) 500 MG tablet Take 500 mg by mouth every 6 (six) hours as needed. For pain    . diphenhydrAMINE (BENADRYL) 25 mg capsule Take 50 mg by mouth every 6 (six) hours as needed. For allergies    . HYDROCORTISONE EX Apply 1 application topically daily as needed. For itching    . ibuprofen (ADVIL,MOTRIN) 600 MG tablet Take 1 tablet (600 mg total) by mouth every 6 (six) hours as needed. (Patient not taking: Reported on 01/02/2015) 30 tablet 1    Results for orders placed or performed during the hospital encounter of 04/04/15 (from the past 48 hour(s))  Pregnancy, urine     Status: None   Collection Time: 04/04/15  1:25 PM  Result Value Ref Range   Preg Test, Ur NEGATIVE NEGATIVE    Comment:        THE SENSITIVITY OF THIS METHODOLOGY IS >20 mIU/mL.   Urinalysis, Routine w reflex microscopic     Status: Abnormal   Collection Time: 04/04/15  1:25 PM  Result Value Ref Range   Color, Urine YELLOW YELLOW   APPearance CLEAR  CLEAR   Specific Gravity, Urine 1.021 1.005 - 1.030   pH 6.0 5.0 - 8.0   Glucose, UA NEGATIVE NEGATIVE mg/dL   Hgb urine dipstick NEGATIVE NEGATIVE   Bilirubin Urine NEGATIVE NEGATIVE   Ketones, ur NEGATIVE NEGATIVE mg/dL   Protein, ur NEGATIVE NEGATIVE mg/dL   Urobilinogen, UA 0.2 0.0 - 1.0 mg/dL   Nitrite NEGATIVE NEGATIVE   Leukocytes, UA SMALL (A) NEGATIVE  Urine microscopic-add on     Status: None   Collection Time: 04/04/15  1:25 PM  Result Value Ref Range   Squamous Epithelial / LPF RARE RARE   WBC, UA 7-10 <3 WBC/hpf   RBC / HPF 0-2 <3 RBC/hpf   Bacteria, UA RARE RARE   Urine-Other MUCOUS PRESENT   CBC with Differential/Platelet     Status: Abnormal   Collection Time: 04/04/15  2:10 PM  Result Value Ref Range   WBC 11.9 (H) 4.0 - 10.5 K/uL   RBC 4.33 3.87 - 5.11 MIL/uL   Hemoglobin 11.2 (L) 12.0 - 15.0 g/dL   HCT 35.3 (L) 36.0 - 46.0 %   MCV 81.5 78.0 - 100.0 fL   MCH 25.9 (L) 26.0 - 34.0 pg   MCHC 31.7 30.0 - 36.0 g/dL   RDW 15.1 11.5 - 15.5 %   Platelets 343  150 - 400 K/uL   Neutrophils Relative % 66 43 - 77 %   Neutro Abs 7.8 (H) 1.7 - 7.7 K/uL   Lymphocytes Relative 21 12 - 46 %   Lymphs Abs 2.5 0.7 - 4.0 K/uL   Monocytes Relative 9 3 - 12 %   Monocytes Absolute 1.1 (H) 0.1 - 1.0 K/uL   Eosinophils Relative 4 0 - 5 %   Eosinophils Absolute 0.5 0.0 - 0.7 K/uL   Basophils Relative 0 0 - 1 %   Basophils Absolute 0.0 0.0 - 0.1 K/uL  Comprehensive metabolic panel     Status: Abnormal   Collection Time: 04/04/15  2:10 PM  Result Value Ref Range   Sodium 137 135 - 145 mmol/L   Potassium 3.9 3.5 - 5.1 mmol/L   Chloride 101 101 - 111 mmol/L   CO2 30 22 - 32 mmol/L   Glucose, Bld 89 70 - 99 mg/dL   BUN 11 6 - 20 mg/dL   Creatinine, Ser 0.77 0.44 - 1.00 mg/dL   Calcium 9.1 8.9 - 10.3 mg/dL   Total Protein 7.7 6.5 - 8.1 g/dL   Albumin 3.4 (L) 3.5 - 5.0 g/dL   AST 31 15 - 41 U/L   ALT 33 14 - 54 U/L   Alkaline Phosphatase 82 38 - 126 U/L   Total Bilirubin 0.4  0.3 - 1.2 mg/dL   GFR calc non Af Amer >60 >60 mL/min   GFR calc Af Amer >60 >60 mL/min    Comment: (NOTE) The eGFR has been calculated using the CKD EPI equation. This calculation has not been validated in all clinical situations. eGFR's persistently <60 mL/min signify possible Chronic Kidney Disease.    Anion gap 6 5 - 15  Sedimentation rate     Status: Abnormal   Collection Time: 04/04/15  2:10 PM  Result Value Ref Range   Sed Rate 61 (H) 0 - 22 mm/hr  Wet prep, genital     Status: Abnormal   Collection Time: 04/04/15  2:10 PM  Result Value Ref Range   Yeast Wet Prep HPF POC NONE SEEN NONE SEEN   Trich, Wet Prep NONE SEEN NONE SEEN   Clue Cells Wet Prep HPF POC MODERATE (A) NONE SEEN   WBC, Wet Prep HPF POC MANY (A) NONE SEEN    Dg Wrist Complete Right  04/04/2015   CLINICAL DATA:  Pain and swelling RIGHT wrist persisting since beginning anti-inflammatory medication and splint placement on Sunday, tender to touch, hand pain  EXAM: RIGHT WRIST - COMPLETE 3+ VIEW  COMPARISON:  None  FINDINGS: Osseous mineralization normal.  Joint spaces preserved.  No acute fracture, dislocation or bone destruction.  Soft tissue swelling identified at the distal forearm extending through the dorsum of the RIGHT hand.  IMPRESSION: Soft tissue swelling without acute osseous findings.   Electronically Signed   By: Lavonia Dana M.D.   On: 04/04/2015 13:29     A comprehensive review of systems was negative except for: Hematologic/lymphatic: positive for easy bruising  Blood pressure 130/86, pulse 97, temperature 98.6 F (37 C), temperature source Oral, resp. rate 19, height '5\' 2"'  (1.575 m), weight 103.4 kg (227 lb 15.3 oz), last menstrual period 03/20/2015, SpO2 100 %.  General appearance: alert, cooperative and appears stated age Head: Normocephalic, without obvious abnormality, atraumatic Neck: supple, symmetrical, trachea midline Extremities: intact sensation and capillary refill all digits.   +epl/fpl/io.  right wrist and hand ttp but mostly at wrist.  pain with passive motion of wrist.  swelling and erythema dorsally.  no wounds.  no proximal streaking. Pulses: 2+ and symmetric Skin: Skin color, texture, turgor normal. No rashes or lesions Neurologic: Grossly normal Incision/Wound: none  Assessment/Plan Right wrist possible septic arthritis.  Recommend OR for incision and drainage right wrist.  Risks, benefits, and alternatives of surgery were discussed and the patient agrees with the plan of care.   Yemariam Magar R 04/04/2015, 10:03 PM

## 2015-04-04 NOTE — ED Notes (Signed)
MD at bedside. 

## 2015-04-04 NOTE — ED Notes (Signed)
Patient transported to X-ray 

## 2015-04-04 NOTE — Op Note (Signed)
[id number] 

## 2015-04-04 NOTE — ED Provider Notes (Signed)
CSN: [id number]     Arrival date & time 04/04/15  1248 History   First MD Initiated Contact with Patient 04/04/15 1250     Chief Complaint  Patient presents with  . Hand Pain     (Consider location/radiation/quality/duration/timing/severity/associated sxs/prior Treatment) HPI   PCP: REESE,BETTI D, MD Blood pressure 110/73, pulse 92, temperature 98.3 F (36.8 C), temperature source Oral, resp. rate 18, height 5\' 2"  (1.575 m), weight 222 lb (100.699 kg), last menstrual period 03/20/2015, SpO2 100 %.  Lauren Garza is a 34 y.o.female with a significant PMH of hx of abnormal pap, chlamydia, bacterial vaginosis presents to the ER with complaints of joint swelling. She was seen this past Sunday a fast med and diagnosed with carpal tunnel to the right wrist after having pain for a few days given a wrist splint. Then 2 days ago she developed swelling in her right ankle. Her right hand is also worse, more red and more tender. She has not had any fevers, weakness, nausea, vomiting, diarrhea, back pain, rash. Pt denies injury, truama or hx of the same. She is sexually active and does not use condoms.-   Past Medical History  Diagnosis Date  . Abnormal Pap smear   . Chlamydia trachomatis infection 2012  . Bacterial vaginosis    Past Surgical History  Procedure Laterality Date  . Gynecologic cryosurgery    . Tubal ligation    . Wisdom tooth extraction     Family History  Problem Relation Age of Onset  . Hypertension Mother    History  Substance Use Topics  . Smoking status: Former Research scientist (life sciences)  . Smokeless tobacco: Never Used  . Alcohol Use: Yes   OB History    Gravida Para Term Preterm AB TAB SAB Ectopic Multiple Living   3 3 3  0 0 0 0 0 0 3     Review of Systems  10 Systems reviewed and are negative for acute change except as noted in the HPI.     Allergies  Clindamycin/lincomycin and Flagyl  Home Medications   Prior to Admission medications   Medication Sig Start Date  End Date Taking? Authorizing Provider  acetaminophen (TYLENOL) 500 MG tablet Take 500 mg by mouth every 6 (six) hours as needed. For pain    Historical Provider, MD  diphenhydrAMINE (BENADRYL) 25 mg capsule Take 50 mg by mouth every 6 (six) hours as needed. For allergies    Historical Provider, MD  HYDROCORTISONE EX Apply 1 application topically daily as needed. For itching    Historical Provider, MD  ibuprofen (ADVIL,MOTRIN) 600 MG tablet Take 1 tablet (600 mg total) by mouth every 6 (six) hours as needed. Patient not taking: Reported on 01/02/2015 10/31/14   Deirdre Freida Busman, CNM   BP 117/84 mmHg  Pulse 89  Temp(Src) 98.3 F (36.8 C) (Oral)  Resp 16  Ht 5\' 2"  (1.575 m)  Wt 222 lb (100.699 kg)  BMI 40.59 kg/m2  SpO2 100%  LMP 03/20/2015 Physical Exam  Constitutional: She appears well-developed and well-nourished. No distress.  HENT:  Head: Normocephalic and atraumatic.  Eyes: Pupils are equal, round, and reactive to light.  Neck: Normal range of motion. Neck supple.  Cardiovascular: Normal rate and regular rhythm.   Pulmonary/Chest: Effort normal.  Abdominal: Soft.  Musculoskeletal:       Right wrist: She exhibits decreased range of motion, tenderness and swelling (indurated wrist joint). She exhibits no bony tenderness, no effusion, no crepitus, no deformity and no laceration.  Right ankle: She exhibits swelling (induration). She exhibits normal range of motion. No tenderness. No posterior TFL tenderness found. Achilles tendon normal.  Pain with passive ROM of all 5 fingers, pain with any sort of wrist movement.  Neurological: She is alert.  Skin: Skin is warm and dry.  Nursing note and vitals reviewed.   ED Course  Procedures (including critical care time) Labs Review Labs Reviewed  WET PREP, GENITAL - Abnormal; Notable for the following:    Clue Cells Wet Prep HPF POC MODERATE (*)    WBC, Wet Prep HPF POC MANY (*)    All other components within normal limits  CBC WITH  DIFFERENTIAL/PLATELET - Abnormal; Notable for the following:    WBC 11.9 (*)    Hemoglobin 11.2 (*)    HCT 35.3 (*)    MCH 25.9 (*)    Neutro Abs 7.8 (*)    Monocytes Absolute 1.1 (*)    All other components within normal limits  COMPREHENSIVE METABOLIC PANEL - Abnormal; Notable for the following:    Albumin 3.4 (*)    All other components within normal limits  SEDIMENTATION RATE - Abnormal; Notable for the following:    Sed Rate 61 (*)    All other components within normal limits  URINALYSIS, ROUTINE W REFLEX MICROSCOPIC - Abnormal; Notable for the following:    Leukocytes, UA SMALL (*)    All other components within normal limits  CULTURE, BLOOD (ROUTINE X 2)  CULTURE, BLOOD (ROUTINE X 2)  PREGNANCY, URINE  URINE MICROSCOPIC-ADD ON  RPR  HIV ANTIBODY (ROUTINE TESTING)  GC/CHLAMYDIA PROBE AMP ([place])    Imaging Review Dg Wrist Complete Right  04/04/2015   CLINICAL DATA:  Pain and swelling RIGHT wrist persisting since beginning anti-inflammatory medication and splint placement on Sunday, tender to touch, hand pain  EXAM: RIGHT WRIST - COMPLETE 3+ VIEW  COMPARISON:  None  FINDINGS: Osseous mineralization normal.  Joint spaces preserved.  No acute fracture, dislocation or bone destruction.  Soft tissue swelling identified at the distal forearm extending through the dorsum of the RIGHT hand.  IMPRESSION: Soft tissue swelling without acute osseous findings.   Electronically Signed   By: Lavonia Dana M.D.   On: 04/04/2015 13:29     EKG Interpretation None      MDM   Final diagnoses:  Septic joint of right wrist  Bacterial vaginosis    I have clinical concern for septic joint, probably due to gonorrhea. Dr. Betsey Holiday and I have discussed the case and he feels that this is a likely diagnosis as well. Her WBC is elevated, she has moderate clue cells and MANY WBC, neg urine preg. Her SED rate is elevated, her wrist xray does not show any lytic lesions.   Medications   oxyCODONE-acetaminophen (PERCOCET/ROXICET) 5-325 MG per tablet 1 tablet (1 tablet Oral Not Given 04/04/15 1411)  ondansetron (ZOFRAN-ODT) disintegrating tablet 4 mg (4 mg Oral Not Given 04/04/15 1411)  sodium chloride 0.9 % bolus 1,000 mL (1,000 mLs Intravenous New Bag/Given 04/04/15 1701)  0.9 %  sodium chloride infusion (not administered)  azithromycin (ZITHROMAX) tablet 1,000 mg (1,000 mg Oral Given 04/04/15 1538)  ondansetron (ZOFRAN-ODT) disintegrating tablet 4 mg (4 mg Oral Given 04/04/15 1537)  cefTRIAXone (ROCEPHIN) injection 250 mg (250 mg Intramuscular Given 04/04/15 1539)  lidocaine (PF) (XYLOCAINE) 1 % injection (0.9 mLs  Given 04/04/15 1541)  ondansetron (ZOFRAN) injection 4 mg (4 mg Intravenous Given 04/04/15 1707)  morphine 4 MG/ML injection 4 mg (  4 mg Intravenous Given 04/04/15 1701)    I recommend admission to [facility] and Hand consult for further evaluation. Dr. Tana Coast with [facility] has agreed to admit patient. Obs, medsurg, MC, MCADMITS  Dr. Fredna Dow will evaluate patient when she arrives to [facility] to evaluate if she needs OR. He recommends she stays NPO. HE WILL NOT BE CONSULTING ON HER RIGHT ANKLE, if she needs attention to the ankle, ortho will need to be called.  I have discussed the plan my concerns for gonococcal arthritis with the patient. She is understanding of the situation, the need for admission, the need for transfer, the need for hand consultation for possible NPO. She's been made aware to stay NPO.  Filed Vitals:   04/04/15 1719  BP: 117/84  Pulse: 89  Temp:   Resp: [address] PA-C 04/04/15 1727  Orpah Greek, MD 04/06/15 [phone]

## 2015-04-04 NOTE — Discharge Instructions (Signed)

## 2015-04-04 NOTE — H&P (Signed)
[facility] History and Physical  Lauren Garza YBO:[id number] DOB: Apr 14, 1981 DOA: 04/04/2015  Referring physician: Delos Haring, PA PCP: Kristine Garbe, MD   Chief Complaint: [place]ord [place]amp Hurts  HPI: Lauren Garza is a 34 y.o. female presents with joint swelling and pain. Patient has a history of chlamydia bacterial vaginosis. She presented to [facility] with swelling of her right wrist. She had also been having some pain in the right ankle. Patient states that she has not had fever or chills noted. She has no headache. She has noted increased pain over the last 2 days. She states she has not hurt it in any way. She states that she has no nausea or vomiting. She is sexually active with unprotected sex and has had prior chlamydia as well as bacterial vaginosis. She has been seen by orthopedics as well as hand surgery and will be taken to the OR for drainage.   Review of Systems:  Remainder ROS performed and is unremarkable  Past Medical History  Diagnosis Date  . Abnormal Pap smear   . Chlamydia trachomatis infection 2012  . Bacterial vaginosis    Past Surgical History  Procedure Laterality Date  . Gynecologic cryosurgery    . Tubal ligation    . Wisdom tooth extraction     Social History:  reports that she has quit smoking. She has never used smokeless tobacco. She reports that she drinks alcohol. She reports that she does not use illicit drugs.  Allergies  Allergen Reactions  . Clindamycin/Lincomycin Hives  . Flagyl [Metronidazole Hcl] Hives    Family History  Problem Relation Age of Onset  . Hypertension Mother     Prior to Admission medications   Medication Sig Start Date End Date Taking? Authorizing Provider  acetaminophen (TYLENOL) 500 MG tablet Take 500 mg by mouth every 6 (six) hours as needed. For pain    Historical Provider, MD  diphenhydrAMINE (BENADRYL) 25 mg capsule Take 50 mg by mouth every 6 (six) hours as needed. For allergies    Historical Provider, MD   HYDROCORTISONE EX Apply 1 application topically daily as needed. For itching    Historical Provider, MD  ibuprofen (ADVIL,MOTRIN) 600 MG tablet Take 1 tablet (600 mg total) by mouth every 6 (six) hours as needed. Patient not taking: Reported on 01/02/2015 10/31/14   Lorene Dy, CNM   Physical Exam: Filed Vitals:   04/04/15 1719 04/04/15 1905 04/04/15 2038 04/04/15 2136  BP: 117/84 128/84 128/80 130/86  Pulse: 89 89 90 97  Temp:    98.6 F (37 C)  TempSrc:    Oral  Resp: 16 16 20 19   Height:    5\' 2"  (1.575 m)  Weight:    103.4 kg (227 lb 15.3 oz)  SpO2: 100% 100% 99% 100%    Wt Readings from Last 3 Encounters:  04/04/15 103.4 kg (227 lb 15.3 oz)  01/02/15 105.915 kg (233 lb 8 oz)  08/15/11 98.884 kg (218 lb)    General:  Appears calm and comfortable Eyes: PERRL, normal lids, irises & conjunctiva ENT: grossly normal hearing, lips & tongue Neck: no LAD, masses or thyromegaly Cardiovascular: RRR, no m/r/g. No LE edema. Respiratory: CTA bilaterally, no w/r/r Abdomen: soft, ntnd Skin: +erythema over the right wrist Musculoskeletal: right wrist swollen warm tender along with right ankle Psychiatric: grossly normal mood and affect, speech fluent and appropriate Neurologic: grossly non-focal.          Labs on Admission:  Basic Metabolic Panel:  Recent  Labs Lab 04/04/15 1410  NA 137  K 3.9  CL 101  CO2 30  GLUCOSE 89  BUN 11  CREATININE 0.77  CALCIUM 9.1   Liver Function Tests:  Recent Labs Lab 04/04/15 1410  AST 31  ALT 33  ALKPHOS 82  BILITOT 0.4  PROT 7.7  ALBUMIN 3.4*   No results for input(s): LIPASE, AMYLASE in the last 168 hours. No results for input(s): AMMONIA in the last 168 hours. CBC:  Recent Labs Lab 04/04/15 1410  WBC 11.9*  NEUTROABS 7.8*  HGB 11.2*  HCT 35.3*  MCV 81.5  PLT 343   Cardiac Enzymes: No results for input(s): CKTOTAL, CKMB, CKMBINDEX, TROPONINI in the last 168 hours.  BNP (last 3 results) No results for input(s):  BNP in the last 8760 hours.  ProBNP (last 3 results) No results for input(s): PROBNP in the last 8760 hours.  CBG: No results for input(s): GLUCAP in the last 168 hours.  Radiological Exams on Admission: Dg Wrist Complete Right  04/04/2015   CLINICAL DATA:  Pain and swelling RIGHT wrist persisting since beginning anti-inflammatory medication and splint placement on Sunday, tender to touch, hand pain  EXAM: RIGHT WRIST - COMPLETE 3+ VIEW  COMPARISON:  None  FINDINGS: Osseous mineralization normal.  Joint spaces preserved.  No acute fracture, dislocation or bone destruction.  Soft tissue swelling identified at the distal forearm extending through the dorsum of the RIGHT hand.  IMPRESSION: Soft tissue swelling without acute osseous findings.   Electronically Signed   By: Lavonia Dana M.D.   On: 04/04/2015 13:29      Assessment/Plan Principal Problem:   Septic joint of right wrist Active Problems:   Septic arthritis of wrist, right   1. Septic Arthritis of Right wrist -will be admitted -going to OR now for drainage -started on Vancocin and Rocephin -follow up on cultures  2. Positive Bacterial vaginosis -metrogel will be started per pharmacy recs -will get [facility] HIV testing   Code Status: Full Code (must indicate code status--if unknown or must be presumed, indicate so) DVT Prophylaxis:TEDs Family Communication: none (indicate person spoken with, if applicable, with phone number if by telephone) Disposition Plan: Home (indicate anticipated LOS)  Time spent: 52min  Aaric Dolph A [facility] Pager [phone]

## 2015-04-04 NOTE — Brief Op Note (Signed)
04/04/2015  11:36 PM  PATIENT:  Lauren Garza  34 y.o. female  PRE-OPERATIVE DIAGNOSIS:  Infection Right Wrist  POST-OPERATIVE DIAGNOSIS:  Infection Right Wrist  PROCEDURE:  Procedure(s): IRRIGATION AND DEBRIDEMENT RIGHT WRIST (Right)  SURGEON:  Surgeon(s) and Role:    * Leanora Cover, MD - Primary  PHYSICIAN ASSISTANT:   ASSISTANTS: none   ANESTHESIA:   general  EBL:     BLOOD ADMINISTERED:none  DRAINS: iodoform packing  LOCAL MEDICATIONS USED:  MARCAINE     SPECIMEN:  Source of Specimen:  right wrist joint and tendon compartments  DISPOSITION OF SPECIMEN:  micro  COUNTS:  YES  TOURNIQUET:   Total Tourniquet Time Documented: Upper Arm (Right) - 39 minutes Total: Upper Arm (Right) - 39 minutes   DICTATION: .Other Dictation: Dictation Number [id number]  PLAN OF CARE: Admitted to hospitalist service  PATIENT DISPOSITION:  PACU - hemodynamically stable.   Delay start of Pharmacological VTE agent (>24hrs) due to surgical blood loss or risk of bleeding: no

## 2015-04-04 NOTE — ED Notes (Signed)
PA at bedside.

## 2015-04-04 NOTE — ED Notes (Signed)
Update called to Apolonio Schneiders, RN [address] [place]. [facility] transporting pt at this time

## 2015-04-04 NOTE — ED Notes (Signed)
Admission discussed- Pt verbalizes understanding of process

## 2015-04-05 ENCOUNTER — Inpatient Hospital Stay (HOSPITAL_COMMUNITY): Payer: Medicaid Other

## 2015-04-05 ENCOUNTER — Encounter (HOSPITAL_COMMUNITY): Payer: Self-pay

## 2015-04-05 LAB — PROCALCITONIN

## 2015-04-05 LAB — CBC WITH DIFFERENTIAL/PLATELET
BASOS ABS: 0 10*3/uL (ref 0.0–0.1)
Basophils Relative: 0 % (ref 0–1)
Eosinophils Absolute: 0.3 10*3/uL (ref 0.0–0.7)
Eosinophils Relative: 2 % (ref 0–5)
HEMATOCRIT: 33 % — AB (ref 36.0–46.0)
Hemoglobin: 10.3 g/dL — ABNORMAL LOW (ref 12.0–15.0)
LYMPHS PCT: 19 % (ref 12–46)
Lymphs Abs: 2.1 10*3/uL (ref 0.7–4.0)
MCH: 25.8 pg — AB (ref 26.0–34.0)
MCHC: 31.2 g/dL (ref 30.0–36.0)
MCV: 82.7 fL (ref 78.0–100.0)
Monocytes Absolute: 0.8 10*3/uL (ref 0.1–1.0)
Monocytes Relative: 7 % (ref 3–12)
NEUTROS ABS: 7.9 10*3/uL — AB (ref 1.7–7.7)
NEUTROS PCT: 72 % (ref 43–77)
Platelets: 328 10*3/uL (ref 150–400)
RBC: 3.99 MIL/uL (ref 3.87–5.11)
RDW: 15 % (ref 11.5–15.5)
WBC: 11.1 10*3/uL — ABNORMAL HIGH (ref 4.0–10.5)

## 2015-04-05 LAB — COMPREHENSIVE METABOLIC PANEL
ALT: 29 U/L (ref 14–54)
ANION GAP: 10 (ref 5–15)
AST: 27 U/L (ref 15–41)
Albumin: 2.8 g/dL — ABNORMAL LOW (ref 3.5–5.0)
Alkaline Phosphatase: 74 U/L (ref 38–126)
BUN: 6 mg/dL (ref 6–20)
CO2: 25 mmol/L (ref 22–32)
CREATININE: 0.7 mg/dL (ref 0.44–1.00)
Calcium: 8.5 mg/dL — ABNORMAL LOW (ref 8.9–10.3)
Chloride: 104 mmol/L (ref 101–111)
GLUCOSE: 122 mg/dL — AB (ref 70–99)
Potassium: 3.7 mmol/L (ref 3.5–5.1)
Sodium: 139 mmol/L (ref 135–145)
Total Bilirubin: 0.7 mg/dL (ref 0.3–1.2)
Total Protein: 6.6 g/dL (ref 6.5–8.1)

## 2015-04-05 LAB — GC/CHLAMYDIA PROBE AMP (~~LOC~~) NOT AT ARMC
Chlamydia: POSITIVE — AB
Neisseria Gonorrhea: POSITIVE — AB

## 2015-04-05 LAB — PROTIME-INR
INR: 1.1 (ref 0.00–1.49)
Prothrombin Time: 14.3 seconds (ref 11.6–15.2)

## 2015-04-05 LAB — RPR: RPR Ser Ql: NONREACTIVE

## 2015-04-05 LAB — GLUCOSE, CAPILLARY: Glucose-Capillary: 74 mg/dL (ref 70–99)

## 2015-04-05 LAB — LACTIC ACID, PLASMA
LACTIC ACID, VENOUS: 0.9 mmol/L (ref 0.5–2.0)
LACTIC ACID, VENOUS: 0.9 mmol/L (ref 0.5–2.0)

## 2015-04-05 LAB — APTT: APTT: 33 s (ref 24–37)

## 2015-04-05 LAB — HIV ANTIBODY (ROUTINE TESTING W REFLEX): HIV SCREEN 4TH GENERATION: NONREACTIVE

## 2015-04-05 MED ORDER — DIPHENHYDRAMINE HCL 25 MG PO CAPS
50.0000 mg | ORAL_CAPSULE | Freq: Four times a day (QID) | ORAL | Status: DC | PRN
  Administered 2015-04-05: 50 mg via ORAL
  Filled 2015-04-05: qty 2

## 2015-04-05 MED ORDER — MORPHINE SULFATE 2 MG/ML IJ SOLN
2.0000 mg | INTRAMUSCULAR | Status: DC | PRN
  Administered 2015-04-05 – 2015-04-06 (×6): 2 mg via INTRAVENOUS
  Filled 2015-04-05 (×6): qty 1

## 2015-04-05 MED ORDER — OXYCODONE HCL 5 MG PO TABS
5.0000 mg | ORAL_TABLET | ORAL | Status: DC | PRN
  Administered 2015-04-05 – 2015-04-06 (×5): 10 mg via ORAL
  Filled 2015-04-05 (×5): qty 2

## 2015-04-05 MED ORDER — LORATADINE 10 MG PO TABS
10.0000 mg | ORAL_TABLET | Freq: Every day | ORAL | Status: DC
Start: 2015-04-05 — End: 2015-04-06
  Administered 2015-04-05 – 2015-04-06 (×2): 10 mg via ORAL
  Filled 2015-04-05 (×2): qty 1

## 2015-04-05 MED ORDER — DOXYCYCLINE HYCLATE 100 MG PO TABS
100.0000 mg | ORAL_TABLET | Freq: Two times a day (BID) | ORAL | Status: DC
  Administered 2015-04-05 – 2015-04-06 (×3): 100 mg via ORAL
  Filled 2015-04-05 (×3): qty 1

## 2015-04-05 MED ORDER — ACETAMINOPHEN 325 MG PO TABS
650.0000 mg | ORAL_TABLET | Freq: Four times a day (QID) | ORAL | Status: DC | PRN
  Administered 2015-04-05 (×2): 650 mg via ORAL
  Filled 2015-04-05 (×2): qty 2

## 2015-04-05 MED ORDER — ACETAMINOPHEN 650 MG RE SUPP: 650.0000 mg | Freq: Four times a day (QID) | RECTAL | Status: DC | PRN

## 2015-04-05 MED ORDER — OXYCODONE HCL 5 MG PO TABS
5.0000 mg | ORAL_TABLET | ORAL | Status: DC | PRN
  Administered 2015-04-05: 5 mg via ORAL
  Filled 2015-04-05: qty 1

## 2015-04-05 MED ORDER — ONDANSETRON HCL 4 MG/2ML IJ SOLN
4.0000 mg | Freq: Four times a day (QID) | INTRAMUSCULAR | Status: DC | PRN
  Administered 2015-04-05 – 2015-04-06 (×3): 4 mg via INTRAVENOUS
  Filled 2015-04-05 (×3): qty 2

## 2015-04-05 MED ORDER — ONDANSETRON HCL 4 MG PO TABS
4.0000 mg | ORAL_TABLET | Freq: Four times a day (QID) | ORAL | Status: DC | PRN
  Administered 2015-04-05: 4 mg via ORAL
  Filled 2015-04-05: qty 1

## 2015-04-05 MED ORDER — HEPARIN SODIUM (PORCINE) 5000 UNIT/ML IJ SOLN
5000.0000 [IU] | Freq: Three times a day (TID) | INTRAMUSCULAR | Status: DC
  Administered 2015-04-06 (×2): 5000 [IU] via SUBCUTANEOUS
  Filled 2015-04-05 (×2): qty 1

## 2015-04-05 MED ORDER — MORPHINE SULFATE 4 MG/ML IJ SOLN
4.0000 mg | Freq: Once | INTRAMUSCULAR | Status: AC
  Administered 2015-04-05: 4 mg via INTRAVENOUS
  Filled 2015-04-05: qty 1

## 2015-04-05 NOTE — Care Management Note (Signed)
Case Management Note  Patient Details  Name: Lauren Garza MRN: [id number] Date of Birth: 1981-11-11  Subjective/Objective:                    Action/Plan: Ur completed.   Expected Discharge Date:  04/08/15               Expected Discharge Plan:  Home/Self Care  In-House Referral:     Discharge planning Services     Post Acute Care Choice:    Choice offered to:     DME Arranged:    DME Agency:     HH Arranged:    HH Agency:     Status of Service:  In process, will continue to follow  Medicare Important Message Given:    Date Medicare IM Given:    Medicare IM give by:    Date Additional Medicare IM Given:    Additional Medicare Important Message give by:     If discussed at [place] of Stay Meetings, dates discussed:    Additional Comments:  Marilu Favre, RN 04/05/2015, 8:57 AM

## 2015-04-05 NOTE — Op Note (Signed)
Lauren Garza, Lauren Garza            ACCOUNT NO.:  [id number]  MEDICAL RECORD NO.:  [id number]  LOCATION:  6N19C                        FACILITY:  [place]  PHYSICIAN:  Leanora Cover, MD        DATE OF BIRTH:  [birth year]  DATE OF PROCEDURE:  04/04/2015 DATE OF DISCHARGE:                              OPERATIVE REPORT   PREOPERATIVE DIAGNOSIS:  Right wrist septic arthritis.  POSTOPERATIVE DIAGNOSES:  Possible right wrist septic arthritis and extensor compartment infection.  PROCEDURE:   Incision and drainage of the right wrist including radiocarpal and midcarpal joint and third, fourth, and fifth dorsal compartment.  SURGEON:  Leanora Cover, MD  ASSISTANTS:  None.  ANESTHESIA:  General.  IV FLUIDS:  Per anesthesia flow sheet.  ESTIMATED BLOOD LOSS:  Minimal.  COMPLICATIONS:  None.  SPECIMENS:  Cultures from fourth dorsal compartment and wrist joint to micro.  TOURNIQUET TIME:  39 minutes.  DISPOSITION:  Stable to PACU.  INDICATIONS:  Lauren Garza is a 34 year old right-hand dominant female who states over the past 4 days, she has had increasingly worse pain, swelling and erythema of the right wrist.  This has been very bothersome to her.  She was seen at [facility], where she was felt to have an infection of the wrist.  I was consulted for management of the injury.  She was admitted to the hospitalist service.  On examination, she had a swollen erythematous and painful wrist with pain on passive range of motion.  I recommended incision and drainage in the operating room.  Risks, benefits, and alternatives of the surgery were discussed including risk of blood loss, infection, damage to nerves, vessels, tendons, ligaments, bone; failure of surgery; need for additional surgery, complications with wound healing, continued pain, continued infection, need for irrigation and debridement, and joint damage.  She voiced understanding of these risks and elected to  proceed.  OPERATIVE COURSE:  After being identified preoperatively by myself, the patient and I agreed upon procedure and site of procedure.  Surgical site was marked.  The risks, benefits, and alternatives of surgery were reviewed and she wished to proceed.  Surgical consent had been signed. She was transferred to the operating room and placed on the operating room table in supine position with the right upper extremity on arm board.  General anesthesia was induced by the anesthesiologist.  The right upper extremity was prepped and draped in normal sterile orthopedic fashion.  Surgical pause was performed between surgeons, anesthesia, and operating room staff, and all were in agreement as to the patient, procedure, and site of procedure.  Tourniquet at the proximal aspect of the extremity was inflated to 250 mmHg after exsanguination of the limb with Esmarch bandage.  Incision was made longitudinally at the dorsum of the wrist.  This was carried into subcutaneous tissues by spreading technique.  Bipolar electrocautery was used to obtain hemostasis.  The subcutaneous tissues were spread.  The fourth dorsal compartment was entered.  There was gross fluid within there.  It was thick and yellowish in coloration.  There was also some thickened synovium which came out as well.  Cultures were taken and some of the tissue was sent for  culture as well.  The third and fifth dorsal compartments were also opened.  There was more fluid within the third dorsal compartment.  The fifth was relatively clean.  The interval between the third and fourth dorsal compartments was created and the radiocarpal joint entered.  There was no gross purulence within the joint.  The midcarpal joint was entered as well.  Again, there was no gross purulence within the joint.  Care was taken to preserve the dorsal ligaments.  The radiocarpal and midcarpal joints were copiously irrigated with sterile saline by Angiocath  needle.  The third, fourth, and fifth dorsal compartments were then irrigated as well.  Bulb syringe was used to irrigate the whole wound with sterile saline.  A quarter- inch Iodoform gauze was used to pack the wound including the radiocarpal and midcarpal joints.  A 10 mL of 0.25% plain Marcaine was injected into the skin edges to aid in postoperative analgesia.  The wound was dressed with sterile 4x4s and wrapped with a Kerlix.  A volar splint was placed and wrapped with Kerlix and Ace bandage.  Tourniquet was deflated at 39 minutes.  Fingertips were pink with brisk capillary refill after deflation of the tourniquet.  The operative drapes were broken down. The patient was awoken from anesthesia safely.  She was transferred back to a stretcher and taken to PACU in stable condition.  She was admitted to the hospitalist.  She was given a dose of 1 g of IV vancomycin after cultures had been taken.     Leanora Cover, MD     KK/MEDQ  D:  04/04/2015  T:  04/05/2015  Job:  [id number]

## 2015-04-05 NOTE — Transfer of Care (Signed)
Immediate Anesthesia Transfer of Care Note  Patient: Lauren Garza  Procedure(s) Performed: Procedure(s): IRRIGATION AND DEBRIDEMENT RIGHT WRIST (Right)  Patient Location: PACU  Anesthesia Type:General  Level of Consciousness: sedated and patient cooperative  Airway & Oxygen Therapy: Patient connected to nasal cannula oxygen  Post-op Assessment: Report given to RN and Post -op Vital signs reviewed and stable  Post vital signs: Reviewed and stable  Last Vitals:  Filed Vitals:   04/04/15 2136  BP: 130/86  Pulse: 97  Temp: 37 C  Resp: 19    Complications: No apparent anesthesia complications

## 2015-04-05 NOTE — Anesthesia Postprocedure Evaluation (Signed)
Anesthesia Post Note  Patient: Lauren Garza  Procedure(s) Performed: Procedure(s) (LRB): IRRIGATION AND DEBRIDEMENT RIGHT WRIST (Right)  Anesthesia type: general  Patient location: PACU  Post pain: Pain level controlled  Post assessment: Patient's Cardiovascular Status Stable  Last Vitals:  Filed Vitals:   04/05/15 0045  BP: 124/86  Pulse: 90  Temp: 36.8 C  Resp: 20    Post vital signs: Reviewed and stable  Level of consciousness: sedated  Complications: No apparent anesthesia complications

## 2015-04-05 NOTE — Progress Notes (Addendum)
Patient Demographics  Lauren Garza, is a 34 y.o. female, DOB - 1981/10/05, XYI:[id number]  Admit date - 04/04/2015   Admitting Physician Ripudeep Krystal Eaton, MD  Outpatient Primary MD for the patient is Kristine Garbe, MD  LOS - 1   Chief Complaint  Patient presents with  . Hand Pain       Admission HPI/Brief narrative: Lauren Garza is a 34 y.o. female presents with joint swelling and pain. Patient has a history of chlamydia bacterial vaginosis. She presented to [facility] with swelling of her right wrist. She had also been having some pain in the right ankle. Patient states that she has not had fever or chills noted. . She is sexually active with unprotected sex and has had prior chlamydia as well as bacterial vaginosis. She has been seen by  hand surgery and had Incision and drainage of the right wrist including radiocarpal and midcarpal joint and third, fourth, and fifth dorsal compartment.  Subjective:   Lauren Garza today has, No headache, No chest pain, No abdominal pain - No Nausea,  No Cough - SOB.   Assessment & Plan    Principal Problem:   Septic joint of right wrist Active Problems:   Septic arthritis of wrist, right  right wrist septic arthritis and extensor compartment infection. - - on with IV vancomycin and Rocephin pending culture results, so far cultures showing no growth, with nothing showing on Gram stain. - Further management as per hand surgery  Bacterial vaginosis -on MetroGel, reports she can tolerate MetroGel, it has hives to oral metronidazole,  GC  probe RNA came positive, already on IV rocephin, media probe RNA came back positive as well, will start on doxycycline 100 mg oral 2 times daily 1 week, discussed with infectious disease. - Informed patient to inform her boyfriend to follow with health department or his PCP for treatment. - HIV and RPR  nonreactive.   Code Status: Full  Family Communication: Boyfriend at bedside  Disposition Plan: Depending on the results of the culture and decisions about antibiotic   Procedures Status post Incision and drainage of the right wrist including radiocarpal and midcarpal joint and third, fourth, and fifth dorsal Compartment 04/04/15 by Dr. Fredna Dow.  Consults  Hand surgery/orthopedic  Medications  Scheduled Meds: . cefTRIAXone (ROCEPHIN)  IV  1 g Intravenous Q24H  . clotrimazole  1 Applicatorful Vaginal QHS  . metroNIDAZOLE  1 Applicatorful Vaginal QHS  . vancomycin  1,000 mg Intravenous Q8H   Continuous Infusions:  PRN Meds:.acetaminophen **OR** acetaminophen, diphenhydrAMINE, ondansetron **OR** ondansetron (ZOFRAN) IV, oxyCODONE  DVT Prophylaxis  will start on subcutaneous heparin 04/06/15  Lab Results  Component Value Date   PLT 328 04/05/2015    Antibiotics   Anti-infectives    Start     Dose/Rate Route Frequency Ordered Stop   04/05/15 1500  cefTRIAXone (ROCEPHIN) 1 g in dextrose 5 % 50 mL IVPB - Premix     1 g 100 mL/hr over 30 Minutes Intravenous Every 24 hours 04/04/15 2157     04/04/15 2200  vancomycin (VANCOCIN) IVPB 1000 mg/200 mL premix     1,000 mg 200 mL/hr over 60 Minutes Intravenous Every 8 hours 04/04/15 2157     04/04/15 1530  azithromycin (  ZITHROMAX) tablet 1,000 mg     1,000 mg Oral  Once 04/04/15 1516 04/04/15 1538   04/04/15 1530  cefTRIAXone (ROCEPHIN) injection 250 mg     250 mg Intramuscular  Once 04/04/15 1516 04/04/15 1539          Objective:   Filed Vitals:   04/05/15 0011 04/05/15 0045 04/05/15 0546 04/05/15 0942  BP: 118/71 124/86 119/82 121/68  Pulse: 108 90 89 89  Temp: 98.2 F (36.8 C) 98.2 F (36.8 C) 98.5 F (36.9 C) 98.3 F (36.8 C)  TempSrc:  Oral Oral Oral  Resp: 16 20 16 19   Height:      Weight:      SpO2: 99% 100% 98% 100%    Wt Readings from Last 3 Encounters:  04/04/15 103.4 kg (227 lb 15.3 oz)  01/02/15  105.915 kg (233 lb 8 oz)  08/15/11 98.884 kg (218 lb)     Intake/Output Summary (Last 24 hours) at 04/05/15 1236 Last data filed at 04/05/15 0000  Gross per 24 hour  Intake    800 ml  Output    250 ml  Net    550 ml     Physical Exam  Awake Alert, Oriented X 3, No new F.N deficits, Normal affect [place].AT,PERRAL Supple Neck,No JVD, No cervical lymphadenopathy appriciated.  Symmetrical Chest wall movement, Good air movement bilaterally, CTAB RRR,No Gallops,Rubs or new Murmurs, No Parasternal Heave +ve B.Sounds, Abd Soft, No tenderness, No organomegaly appriciated, No rebound - guarding or rigidity. No Cyanosis, Clubbing or edema, No new Rash or bruise  , right arm bandaged and wrapped with Ace wrap, good capillary refill at tip of the digits, no cyanosis.   Data Review   Micro Results Recent Results (from the past 240 hour(s))  Wet prep, genital     Status: Abnormal   Collection Time: 04/04/15  2:10 PM  Result Value Ref Range Status   Yeast Wet Prep HPF POC NONE SEEN NONE SEEN Final   Trich, Wet Prep NONE SEEN NONE SEEN Final   Clue Cells Wet Prep HPF POC MODERATE (A) NONE SEEN Final   WBC, Wet Prep HPF POC MANY (A) NONE SEEN Final  Culture, blood (routine x 2)     Status: None (Preliminary result)   Collection Time: 04/04/15  4:45 PM  Result Value Ref Range Status   Specimen Description BLOOD RIGHT ARM  Final   Special Requests BOTTLES DRAWN AEROBIC AND ANAEROBIC 10 CC EACH  Final   Culture   Final           BLOOD CULTURE RECEIVED NO GROWTH TO DATE CULTURE WILL BE HELD FOR 5 DAYS BEFORE ISSUING A FINAL NEGATIVE REPORT Performed at [facility]    Report Status PENDING  Incomplete  Culture, blood (routine x 2)     Status: None (Preliminary result)   Collection Time: 04/04/15  5:15 PM  Result Value Ref Range Status   Specimen Description BLOOD RIGHT ARM  Final   Special Requests BOTTLES DRAWN AEROBIC AND ANAEROBIC 5CC EACH  Final   Culture   Final           BLOOD  CULTURE RECEIVED NO GROWTH TO DATE CULTURE WILL BE HELD FOR 5 DAYS BEFORE ISSUING A FINAL NEGATIVE REPORT Performed at [facility]    Report Status PENDING  Incomplete  Anaerobic culture     Status: None (Preliminary result)   Collection Time: 04/04/15 10:56 PM  Result Value Ref Range Status  Specimen Description ABSCESS  RIGHT WRIST  Final   Special Requests A  Final   Gram Stain   Final    FEW WBC PRESENT,BOTH PMN AND MONONUCLEAR NO SQUAMOUS EPITHELIAL CELLS SEEN NO ORGANISMS SEEN Performed at [facility]    Culture   Final    NO ANAEROBES ISOLATED; CULTURE IN PROGRESS FOR 5 DAYS Performed at [facility]    Report Status PENDING  Incomplete  Culture, routine-abscess     Status: None (Preliminary result)   Collection Time: 04/04/15 10:56 PM  Result Value Ref Range Status   Specimen Description ABSCESS RIGHT WRIST  Final   Special Requests A  Final   Gram Stain   Final    FEW WBC PRESENT,BOTH PMN AND MONONUCLEAR NO SQUAMOUS EPITHELIAL CELLS SEEN NO ORGANISMS SEEN Performed at [facility]    Culture PENDING  Incomplete   Report Status PENDING  Incomplete  Anaerobic culture     Status: None (Preliminary result)   Collection Time: 04/04/15 10:59 PM  Result Value Ref Range Status   Specimen Description TISSUE  Final   Special Requests RIGHT WRIST TENOSYNOVIUM  Final   Gram Stain   Final    RARE WBC PRESENT, PREDOMINANTLY MONONUCLEAR NO ORGANISMS SEEN Performed at [facility]    Culture   Final    NO ANAEROBES ISOLATED; CULTURE IN PROGRESS FOR 5 DAYS Performed at [facility]    Report Status PENDING  Incomplete  Tissue culture     Status: None (Preliminary result)   Collection Time: 04/04/15 10:59 PM  Result Value Ref Range Status   Specimen Description TISSUE  Final   Special Requests RIGHT WRIST TENOSYNOVIUM  Final   Gram Stain   Final    RARE WBC PRESENT, PREDOMINANTLY MONONUCLEAR NO ORGANISMS SEEN Performed  at [facility]    Culture PENDING  Incomplete   Report Status PENDING  Incomplete  Anaerobic culture     Status: None (Preliminary result)   Collection Time: 04/04/15 11:12 PM  Result Value Ref Range Status   Specimen Description TISSUE  Final   Special Requests RIGHT WRIST JOINT TISSUE  Final   Gram Stain PENDING  Incomplete   Culture   Final    NO ANAEROBES ISOLATED; CULTURE IN PROGRESS FOR 5 DAYS Performed at [facility]    Report Status PENDING  Incomplete    Radiology Reports Dg Wrist Complete Right  04/04/2015   CLINICAL DATA:  Pain and swelling RIGHT wrist persisting since beginning anti-inflammatory medication and splint placement on Sunday, tender to touch, hand pain  EXAM: RIGHT WRIST - COMPLETE 3+ VIEW  COMPARISON:  None  FINDINGS: Osseous mineralization normal.  Joint spaces preserved.  No acute fracture, dislocation or bone destruction.  Soft tissue swelling identified at the distal forearm extending through the dorsum of the RIGHT hand.  IMPRESSION: Soft tissue swelling without acute osseous findings.   Electronically Signed   By: Lavonia Dana M.D.   On: 04/04/2015 13:29   Dg Chest Port 1 View  04/05/2015   CLINICAL DATA:  Sepsis  EXAM: PORTABLE CHEST - 1 VIEW  COMPARISON:  None.  FINDINGS: A single AP portable view of the chest demonstrates no focal airspace consolidation or alveolar edema. The lungs are grossly clear. There is no large effusion or pneumothorax. Cardiac and mediastinal contours appear unremarkable.  IMPRESSION: No active disease.   Electronically Signed   By: Andreas Newport M.D.   On: 04/05/2015  01:29     CBC  Recent Labs Lab 04/04/15 1410 04/05/15 0215  WBC 11.9* 11.1*  HGB 11.2* 10.3*  HCT 35.3* 33.0*  PLT 343 328  MCV 81.5 82.7  MCH 25.9* 25.8*  MCHC 31.7 31.2  RDW 15.1 15.0  LYMPHSABS 2.5 2.1  MONOABS 1.1* 0.8  EOSABS 0.5 0.3  BASOSABS 0.0 0.0    Chemistries   Recent Labs Lab 04/04/15 1410 04/05/15 0215  NA 137  139  K 3.9 3.7  CL 101 104  CO2 30 25  GLUCOSE 89 122*  BUN 11 6  CREATININE 0.77 0.70  CALCIUM 9.1 8.5*  AST 31 27  ALT 33 29  ALKPHOS 82 74  BILITOT 0.4 0.7   ------------------------------------------------------------------------------------------------------------------ estimated creatinine clearance is 114.8 mL/min (by C-G formula based on Cr of 0.7). ------------------------------------------------------------------------------------------------------------------ No results for input(s): HGBA1C in the last 72 hours. ------------------------------------------------------------------------------------------------------------------ No results for input(s): CHOL, HDL, LDLCALC, TRIG, CHOLHDL, LDLDIRECT in the last 72 hours. ------------------------------------------------------------------------------------------------------------------ No results for input(s): TSH, T4TOTAL, T3FREE, THYROIDAB in the last 72 hours.  Invalid input(s): FREET3 ------------------------------------------------------------------------------------------------------------------ No results for input(s): VITAMINB12, FOLATE, FERRITIN, TIBC, IRON, RETICCTPCT in the last 72 hours.  Coagulation profile  Recent Labs Lab 04/05/15 0215  INR 1.10    No results for input(s): DDIMER in the last 72 hours.  Cardiac Enzymes No results for input(s): CKMB, TROPONINI, MYOGLOBIN in the last 168 hours.  Invalid input(s): CK ------------------------------------------------------------------------------------------------------------------ Invalid input(s): POCBNP     Time Spent in minutes   30 minutes   Lauren Garza M.D on 04/05/2015 at 12:36 PM  Between 7am to 7pm - Pager - [phone]  After 7pm go to [email or web address] - password [facility]  [facility]   Office  [phone]

## 2015-04-05 NOTE — Progress Notes (Signed)
Subjective: 1 Day Post-Op Procedure(s) (LRB): IRRIGATION AND DEBRIDEMENT RIGHT WRIST (Right) Patient reports pain as controlled.    Objective: Vital signs in last 24 hours: Temp:  [97.9 F (36.6 C)-98.6 F (37 C)] 98.5 F (36.9 C) (05/06 1348) Pulse Rate:  [85-112] 85 (05/06 1348) Resp:  [14-20] 17 (05/06 1348) BP: (107-130)/(62-86) 107/69 mmHg (05/06 1348) SpO2:  [98 %-100 %] 99 % (05/06 1348) Weight:  [103.4 kg (227 lb 15.3 oz)] 103.4 kg (227 lb 15.3 oz) (05/05 2136)  Intake/Output from previous day: 05/05 0701 - 05/06 0700 In: 800 [I.V.:800] Out: 250 [Urine:250] Intake/Output this shift:     Recent Labs  04/04/15 1410 04/05/15 0215  HGB 11.2* 10.3*    Recent Labs  04/04/15 1410 04/05/15 0215  WBC 11.9* 11.1*  RBC 4.33 3.99  HCT 35.3* 33.0*  PLT 343 328    Recent Labs  04/04/15 1410 04/05/15 0215  NA 137 139  K 3.9 3.7  CL 101 104  CO2 30 25  BUN 11 6  CREATININE 0.77 0.70  GLUCOSE 89 122*  CALCIUM 9.1 8.5*    Recent Labs  04/05/15 0215  INR 1.10    intact sensation and capillary refill all digits.  +epl/fpl/io.  dressing clean/dry/intact.  Assessment/Plan: 1 Day Post-Op Procedure(s) (LRB): IRRIGATION AND DEBRIDEMENT RIGHT WRIST (Right) Stable post op.  Okay for d/c home from hand standpoint.  Follow up in office Monday for hydrotherapy.  Hydrotherapy in hospital if not discharged by then.  Muhamed Luecke R 04/05/2015, 5:14 PM

## 2015-04-06 LAB — BASIC METABOLIC PANEL
Anion gap: 7 (ref 5–15)
BUN: 7 mg/dL (ref 6–20)
CHLORIDE: 102 mmol/L (ref 101–111)
CO2: 28 mmol/L (ref 22–32)
Calcium: 8.8 mg/dL — ABNORMAL LOW (ref 8.9–10.3)
Creatinine, Ser: 0.88 mg/dL (ref 0.44–1.00)
GFR calc Af Amer: >60 mL/min (ref >60)
GLUCOSE: 97 mg/dL (ref 70–99)
Potassium: 4 mmol/L (ref 3.5–5.1)
SODIUM: 137 mmol/L (ref 135–145)

## 2015-04-06 LAB — CBC
HEMATOCRIT: 36.2 % (ref 36.0–46.0)
Hemoglobin: 11.1 g/dL — ABNORMAL LOW (ref 12.0–15.0)
MCH: 25.4 pg — ABNORMAL LOW (ref 26.0–34.0)
MCHC: 30.7 g/dL (ref 30.0–36.0)
MCV: 82.8 fL (ref 78.0–100.0)
Platelets: 366 10*3/uL (ref 150–400)
RBC: 4.37 MIL/uL (ref 3.87–5.11)
RDW: 15.1 % (ref 11.5–15.5)
WBC: 10.4 10*3/uL (ref 4.0–10.5)

## 2015-04-06 LAB — GLUCOSE, CAPILLARY: Glucose-Capillary: 99 mg/dL (ref 70–99)

## 2015-04-06 MED ORDER — SODIUM CHLORIDE 0.9 % IV BOLUS (SEPSIS)
500.0000 mL | Freq: Once | INTRAVENOUS | Status: AC
  Administered 2015-04-06: 500 mL via INTRAVENOUS

## 2015-04-06 MED ORDER — CEFUROXIME AXETIL 500 MG PO TABS: 500.0000 mg | ORAL_TABLET | Freq: Two times a day (BID) | ORAL | Status: DC

## 2015-04-06 MED ORDER — CLOTRIMAZOLE 2 % VA CREA: 1.0000 | TOPICAL_CREAM | Freq: Every day | VAGINAL | Status: DC

## 2015-04-06 MED ORDER — DIPHENHYDRAMINE HCL 25 MG PO CAPS: 25.0000 mg | ORAL_CAPSULE | Freq: Four times a day (QID) | ORAL | Status: DC | PRN

## 2015-04-06 MED ORDER — DOXYCYCLINE HYCLATE 100 MG PO TABS: 100.0000 mg | ORAL_TABLET | Freq: Two times a day (BID) | ORAL | Status: DC

## 2015-04-06 MED ORDER — OXYCODONE HCL ER 15 MG PO T12A: 15.0000 mg | EXTENDED_RELEASE_TABLET | Freq: Two times a day (BID) | ORAL | Status: DC

## 2015-04-06 MED ORDER — METRONIDAZOLE 0.75 % VA GEL: 1.0000 | Freq: Every day | VAGINAL | Status: DC

## 2015-04-06 MED ORDER — ALUM & MAG HYDROXIDE-SIMETH 200-200-20 MG/5ML PO SUSP
30.0000 mL | Freq: Once | ORAL | Status: AC
  Administered 2015-04-06: 30 mL via ORAL
  Filled 2015-04-06: qty 30

## 2015-04-06 MED ORDER — OXYCODONE HCL ER 15 MG PO T12A
15.0000 mg | EXTENDED_RELEASE_TABLET | Freq: Two times a day (BID) | ORAL | Status: DC
  Administered 2015-04-06: 15 mg via ORAL
  Filled 2015-04-06: qty 1

## 2015-04-06 MED ORDER — LORATADINE 10 MG PO TABS: 10.0000 mg | ORAL_TABLET | Freq: Every day | ORAL | Status: DC

## 2015-04-06 MED ORDER — IBUPROFEN 600 MG PO TABS
600.0000 mg | ORAL_TABLET | ORAL | Status: DC | PRN
  Administered 2015-04-06: 600 mg via ORAL
  Filled 2015-04-06: qty 1

## 2015-04-06 MED ORDER — OXYCODONE HCL 5 MG PO TABS: 5.0000 mg | ORAL_TABLET | ORAL | Status: DC | PRN

## 2015-04-06 MED ORDER — PROMETHAZINE HCL 12.5 MG PO TABS: 12.5000 mg | ORAL_TABLET | Freq: Four times a day (QID) | ORAL | Status: DC | PRN

## 2015-04-06 NOTE — Discharge Summary (Addendum)
Physician Discharge Summary   Patient ID: Lauren Garza MRN: [id number] DOB/AGE: 32-Dec-1982 32 y.o.  Admit date: 04/04/2015 Discharge date: 04/06/2015  Primary Care Physician:  Kristine Garbe, MD  Discharge Diagnoses:    . Septic joint of right wrist possibly gonococcal arthritis  . bacterial vaginosis   chlamydia   Neisseria positive in the GC probe  Consults:  Hand surgery, Dr. Fredna Dow Infectious disease,    Recommendations for Outpatient Follow-up:  Patient was recommended MetroGel (patient reports allergy to oral metronidazole)  Discussed in detail with infectious disease, Dr. Megan Salon, recommended doxycycline 100 mg twice a day for 2 weeks for chlamydia and Ceftin 500mg  BID to cover for Neisseria for 2 weeks. Patient has a follow-up appointment on Monday with Dr. Fredna Dow, hand surgery will be following the patient however if the patient is not improving, she will need IV Rocephin for 2 weeks via PICC line.   TESTS THAT NEED FOLLOW-UP Follow on the wrist cultures   DIET: Heart healthy diet    Allergies:   Allergies  Allergen Reactions  . Clindamycin/Lincomycin Hives  . Flagyl [Metronidazole Hcl] Hives     Discharge Medications:   Medication List    TAKE these medications        acetaminophen 500 MG tablet  Commonly known as:  TYLENOL  Take 1,000 mg by mouth 2 (two) times daily as needed (pain). For pain     ALLERGY EYE DROPS OP  Apply 1 drop to eye daily as needed. Only on right eye.     cefUROXime 500 MG tablet  Commonly known as:  CEFTIN  Take 1 tablet (500 mg total) by mouth 2 (two) times daily with a meal. x2 weeks     clotrimazole 2 % vaginal cream  Commonly known as:  GYNE-LOTRIMIN 3  Place 1 Applicatorful vaginally at bedtime. X 3 days     diphenhydrAMINE 25 mg capsule  Commonly known as:  BENADRYL  Take 1 capsule (25 mg total) by mouth every 6 (six) hours as needed. For allergies     doxycycline 100 MG tablet  Commonly known as:   VIBRA-TABS  Take 1 tablet (100 mg total) by mouth 2 (two) times daily. X 2 WEEKS     ibuprofen 600 MG tablet  Commonly known as:  ADVIL,MOTRIN  Take 1 tablet (600 mg total) by mouth every 6 (six) hours as needed.     loratadine 10 MG tablet  Commonly known as:  CLARITIN  Take 1 tablet (10 mg total) by mouth daily. Also available over the counter     metroNIDAZOLE 0.75 % vaginal gel  Commonly known as:  METROGEL  Place 1 Applicatorful vaginally at bedtime. x7 days     oxyCODONE 5 MG immediate release tablet  Commonly known as:  Oxy IR/ROXICODONE  Take 1-2 tablets (5-10 mg total) by mouth every 4 (four) hours as needed for moderate pain.     OxyCODONE 15 mg T12a 12 hr tablet  Commonly known as:  OXYCONTIN  Take 1 tablet (15 mg total) by mouth every 12 (twelve) hours. Further refills by PCP or hand surgery if needed.     promethazine 12.5 MG tablet  Commonly known as:  PHENERGAN  Take 1 tablet (12.5 mg total) by mouth every 6 (six) hours as needed for nausea or vomiting.         Brief H and P: For complete details please refer to admission H and P, but in brief Lauren Garza is a  34 y.o. female presents with joint swelling and pain. Patient has a history of chlamydia bacterial vaginosis. She presented to [facility] with swelling of her right wrist. She had also been having some pain in the right ankle. Patient reported that she has not had fever or chills noted. . She is sexually active with unprotected sex and has had prior chlamydia as well as bacterial vaginosis. She was been seen by hand surgery and had Incision and drainage of the right wrist including radiocarpal and midcarpal joint and third, fourth, and fifth dorsal compartment.  Hospital Course:   Right wrist septic arthritis and extensor compartment infection, possible gonococcal arthritis Patient was placed on IV vancomycin, IV Rocephin. Patient underwent incision and drainage of the right wrist including radiocarpal and  midcarpal joint and third fourth and fifth dorsal compartment. Wrist cultures have been negative so far. Patient was followed postoperatively by hand surgery, Dr. Fredna Dow and recommended okay for discharge home from hand standpoint. GC probe was positive for Chlamydia and Neisseria which is concerning for possible gonococcal arthritis. I discussed in detail with infectious disease, Dr. Daniel Nones, recommended doxycycline for 2 weeks for chlamydia and Ceftin 500 mgBID for 2 weeks. Patient will be followed by hand surgery closely, appointment on Monday 5/9. Dr. Megan Salon recommended that if the patient is not improving with her right wrist septic arthritis, she will need IV Rocephin via PICC line for 2 weeks. Patient continued to have issues with pain control, hence was given oxycontin 15mg  q12hours (#10) WITH NO REFILLS, and oxycodone 5-10mg  q4hours PRN as needed for pain control, ibuprofen.      Bacterial vaginosis, chlamydia, Neisseria infection -on MetroGel, reports she can tolerate MetroGel, it has hives to oral metronidazole,GC probe RNA came positive, patient had been on IV Rocephin. GC probe positive for Chlamydia and Neisseria. Per ID recommendations, Dr. Megan Salon, placed on doxycycline for 2 week for chlamydia and Ceftin for 2 weeks for Neisseria.  Informed patient to inform her boyfriend to follow with health department or his PCP for treatment. - HIV and RPR nonreactive.    Day of Discharge BP 119/84 mmHg  Pulse 86  Temp(Src) 98.6 F (37 C) (Oral)  Resp 19  Ht 5\' 2"  (1.575 m)  Wt 103.4 kg (227 lb 15.3 oz)  BMI 41.68 kg/m2  SpO2 99%  LMP 03/20/2015  Physical Exam: General: Alert and awake oriented x3 not in any acute distress. HEENT: anicteric sclera, pupils reactive to light and accommodation CVS: S1-S2 clear no murmur rubs or gallops Chest: clear to auscultation bilaterally, no wheezing rales or rhonchi Abdomen: soft nontender, nondistended, normal bowel sounds Extremities:  no cyanosis, clubbing or edema noted bilaterally, right arm dressing intact Neuro: Cranial nerves II-XII intact, no focal neurological deficits   The results of significant diagnostics from this hospitalization (including imaging, microbiology, ancillary and laboratory) are listed below for reference.    LAB RESULTS: Basic Metabolic Panel:  Recent Labs Lab 04/05/15 0215 04/06/15 0357  NA 139 137  K 3.7 4.0  CL 104 102  CO2 25 28  GLUCOSE 122* 97  BUN 6 7  CREATININE 0.70 0.88  CALCIUM 8.5* 8.8*   Liver Function Tests:  Recent Labs Lab 04/04/15 1410 04/05/15 0215  AST 31 27  ALT 33 29  ALKPHOS 82 74  BILITOT 0.4 0.7  PROT 7.7 6.6  ALBUMIN 3.4* 2.8*   No results for input(s): LIPASE, AMYLASE in the last 168 hours. No results for input(s): AMMONIA in the last 168 hours.  CBC:  Recent Labs Lab 04/05/15 0215 04/06/15 0357  WBC 11.1* 10.4  NEUTROABS 7.9*  --   HGB 10.3* 11.1*  HCT 33.0* 36.2  MCV 82.7 82.8  PLT 328 366   Cardiac Enzymes: No results for input(s): CKTOTAL, CKMB, CKMBINDEX, TROPONINI in the last 168 hours. BNP: Invalid input(s): POCBNP CBG:  Recent Labs Lab 04/05/15 0757 04/06/15 0840  GLUCAP 74 99    Significant Diagnostic Studies:  Dg Wrist Complete Right  04/04/2015   CLINICAL DATA:  Pain and swelling RIGHT wrist persisting since beginning anti-inflammatory medication and splint placement on Sunday, tender to touch, hand pain  EXAM: RIGHT WRIST - COMPLETE 3+ VIEW  COMPARISON:  None  FINDINGS: Osseous mineralization normal.  Joint spaces preserved.  No acute fracture, dislocation or bone destruction.  Soft tissue swelling identified at the distal forearm extending through the dorsum of the RIGHT hand.  IMPRESSION: Soft tissue swelling without acute osseous findings.   Electronically Signed   By: Lavonia Dana M.D.   On: 04/04/2015 13:29   Dg Chest Port 1 View  04/05/2015   CLINICAL DATA:  Sepsis  EXAM: PORTABLE CHEST - 1 VIEW  COMPARISON:   None.  FINDINGS: A single AP portable view of the chest demonstrates no focal airspace consolidation or alveolar edema. The lungs are grossly clear. There is no large effusion or pneumothorax. Cardiac and mediastinal contours appear unremarkable.  IMPRESSION: No active disease.   Electronically Signed   By: Andreas Newport M.D.   On: 04/05/2015 01:29    2D ECHO:   Disposition and Follow-up: Discharge Instructions    Diet - low sodium heart healthy    Complete by:  As directed      Increase activity slowly    Complete by:  As directed             DISPOSITION: Home   DISCHARGE FOLLOW-UP Follow-up Information    Follow up with Tennis Must, MD On 04/08/2015.   Specialty:  Orthopedic Surgery   Why:  for hospital follow-up   Contact information:   [address] [place] [address] [phone]       Follow up with REESE,BETTI D, MD. Schedule an appointment as soon as possible for a visit in 2 weeks.   Specialty:  Family Medicine   Why:  for hospital follow-up   Contact information:   [address] [place] [address] [phone]        Time spent on Discharge: 35 minutes  Signed:   RAI,RIPUDEEP M.D. [facility] 04/06/2015, 12:02 PM Pager: [phone]

## 2015-04-06 NOTE — Progress Notes (Signed)
Discharge instructions, rx's and when to call the MD reviewed with the patient. Rx's given. Explained the differences in the SR and IR oxycodone and pt verbalized understanding. Pt reports she is ready for discharge.

## 2015-04-08 ENCOUNTER — Encounter (HOSPITAL_COMMUNITY): Payer: Self-pay | Admitting: Orthopedic Surgery

## 2015-04-08 LAB — CULTURE, ROUTINE-ABSCESS: CULTURE: NO GROWTH

## 2015-04-08 LAB — TISSUE CULTURE
Culture: NO GROWTH
Culture: NO GROWTH
Gram Stain: NONE SEEN

## 2015-04-09 LAB — ANAEROBIC CULTURE: Gram Stain: NONE SEEN

## 2015-04-10 LAB — CULTURE, BLOOD (ROUTINE X 2)
CULTURE: NO GROWTH
Culture: NO GROWTH

## 2015-04-11 LAB — CULTURE, BLOOD (ROUTINE X 2)
CULTURE: NO GROWTH
Culture: NO GROWTH

## 2015-05-19 ENCOUNTER — Emergency Department (HOSPITAL_BASED_OUTPATIENT_CLINIC_OR_DEPARTMENT_OTHER)
Admission: EM | Admit: 2015-05-19 | Discharge: 2015-05-19 | Disposition: A | Discharge status: Home / Self Care | Payer: Medicaid Other | Attending: Emergency Medicine | Admitting: Emergency Medicine

## 2015-05-19 ENCOUNTER — Emergency Department (HOSPITAL_BASED_OUTPATIENT_CLINIC_OR_DEPARTMENT_OTHER): Payer: Medicaid Other

## 2015-05-19 ENCOUNTER — Encounter (HOSPITAL_BASED_OUTPATIENT_CLINIC_OR_DEPARTMENT_OTHER): Payer: Self-pay | Admitting: *Deleted

## 2015-05-19 DIAGNOSIS — Z792 Long term (current) use of antibiotics: Secondary | ICD-10-CM | POA: Diagnosis not present

## 2015-05-19 DIAGNOSIS — Z87891 Personal history of nicotine dependence: Secondary | ICD-10-CM | POA: Diagnosis not present

## 2015-05-19 DIAGNOSIS — Z8619 Personal history of other infectious and parasitic diseases: Secondary | ICD-10-CM | POA: Insufficient documentation

## 2015-05-19 DIAGNOSIS — R05 Cough: Secondary | ICD-10-CM

## 2015-05-19 DIAGNOSIS — Z79899 Other long term (current) drug therapy: Secondary | ICD-10-CM | POA: Insufficient documentation

## 2015-05-19 DIAGNOSIS — J018 Other acute sinusitis: Secondary | ICD-10-CM | POA: Diagnosis not present

## 2015-05-19 DIAGNOSIS — R059 Cough, unspecified: Secondary | ICD-10-CM

## 2015-05-19 MED ORDER — HYDROCODONE-HOMATROPINE 5-1.5 MG/5ML PO SYRP: 5.0000 mL | ORAL_SOLUTION | Freq: Four times a day (QID) | ORAL | Status: DC | PRN

## 2015-05-19 MED ORDER — AMOXICILLIN 500 MG PO CAPS: 500.0000 mg | ORAL_CAPSULE | Freq: Three times a day (TID) | ORAL | Status: DC

## 2015-05-19 NOTE — ED Notes (Signed)
D/c home with rx x 2 given for hycodan and amoxicillin

## 2015-05-19 NOTE — ED Notes (Signed)
Patient transported to X-ray and returned 

## 2015-05-19 NOTE — Discharge Instructions (Signed)
Cough, Adult  A cough is a reflex that helps clear your throat and airways. It can help heal the body or may be a reaction to an irritated airway. A cough may only last 2 or 3 weeks (acute) or may last more than 8 weeks (chronic).  CAUSES Acute cough:  Viral or bacterial infections. Chronic cough:  Infections.  Allergies.  Asthma.  Post-nasal drip.  Smoking.  Heartburn or acid reflux.  Some medicines.  Chronic lung problems (COPD).  Cancer. SYMPTOMS   Cough.  Fever.  Chest pain.  Increased breathing rate.  High-pitched whistling sound when breathing (wheezing).  Colored mucus that you cough up (sputum). TREATMENT   A bacterial cough may be treated with antibiotic medicine.  A viral cough must run its course and will not respond to antibiotics.  Your caregiver may recommend other treatments if you have a chronic cough. HOME CARE INSTRUCTIONS   Only take over-the-counter or prescription medicines for pain, discomfort, or fever as directed by your caregiver. Use cough suppressants only as directed by your caregiver.  Use a cold steam vaporizer or humidifier in your bedroom or home to help loosen secretions.  Sleep in a semi-upright position if your cough is worse at night.  Rest as needed.  Stop smoking if you smoke. SEEK IMMEDIATE MEDICAL CARE IF:   You have pus in your sputum.  Your cough starts to worsen.  You cannot control your cough with suppressants and are losing sleep.  You begin coughing up blood.  You have difficulty breathing.  You develop pain which is getting worse or is uncontrolled with medicine.  You have a fever. MAKE SURE YOU:   Understand these instructions.  Will watch your condition.  Will get help right away if you are not doing well or get worse. Document Released: 05/15/2011 Document Revised: 02/08/2012 Document Reviewed: 05/15/2011 [facility] Patient Information 2015 [place], [place]. This information is not intended  to replace advice given to you by your health care provider. Make sure you discuss any questions you have with your health care provider.  Sinusitis Sinusitis is redness, soreness, and puffiness (inflammation) of the air pockets in the bones of your face (sinuses). The redness, soreness, and puffiness can cause air and mucus to get trapped in your sinuses. This can allow germs to grow and cause an infection.  HOME CARE   Drink enough fluids to keep your pee (urine) clear or pale yellow.  Use a humidifier in your home.  Run a hot shower to create steam in the bathroom. Sit in the bathroom with the door closed. Breathe in the steam 3-4 times a day.  Put a warm, moist washcloth on your face 3-4 times a day, or as told by your doctor.  Use salt water sprays (saline sprays) to wet the thick fluid in your nose. This can help the sinuses drain.  Only take medicine as told by your doctor. GET HELP RIGHT AWAY IF:   Your pain gets worse.  You have very bad headaches.  You are sick to your stomach (nauseous).  You throw up (vomit).  You are very sleepy (drowsy) all the time.  Your face is puffy (swollen).  Your vision changes.  You have a stiff neck.  You have trouble breathing. MAKE SURE YOU:   Understand these instructions.  Will watch your condition.  Will get help right away if you are not doing well or get worse. Document Released: 05/04/2008 Document Revised: 08/10/2012 Document Reviewed: 06/21/2012 [employer] Patient  Information 2015 [place], [place]. This information is not intended to replace advice given to you by your health care provider. Make sure you discuss any questions you have with your health care provider.

## 2015-05-19 NOTE — ED Notes (Signed)
Reports cough since having surgery on May 5th- productive for green secretions

## 2015-05-19 NOTE — ED Provider Notes (Signed)
CSN: [id number]     Arrival date & time 05/19/15  1106 History   First MD Initiated Contact with Patient 05/19/15 1202     Chief Complaint  Patient presents with  . Cough     (Consider location/radiation/quality/duration/timing/severity/associated sxs/prior Treatment) HPI Comments: Pt comes in with c/o sore throat, cough, nasal congestion and right sided facial pain since being intubated for a surgery on may 5. Denies running fever. States that she has been taking multiple otc medications without relief. Cough is keeping her up at night. No history of asthma.  The history is provided by the patient. No language interpreter was used.    Past Medical History  Diagnosis Date  . Abnormal Pap smear   . Chlamydia trachomatis infection 2012  . Bacterial vaginosis    Past Surgical History  Procedure Laterality Date  . Gynecologic cryosurgery    . Tubal ligation    . Wisdom tooth extraction    . I&d extremity Right 04/04/2015    Procedure: IRRIGATION AND DEBRIDEMENT RIGHT WRIST;  Surgeon: Leanora Cover, MD;  Location: [place];  Service: Orthopedics;  Laterality: Right;  . Wrist surgery     Family History  Problem Relation Age of Onset  . Hypertension Mother    History  Substance Use Topics  . Smoking status: Former Research scientist (life sciences)  . Smokeless tobacco: Never Used  . Alcohol Use: Yes   OB History    Gravida Para Term Preterm AB TAB SAB Ectopic Multiple Living   3 3 3  0 0 0 0 0 0 3     Review of Systems  All other systems reviewed and are negative.     Allergies  Clindamycin/lincomycin and Flagyl  Home Medications   Prior to Admission medications   Medication Sig Start Date End Date Taking? Authorizing Provider  azithromycin (ZITHROMAX) 500 MG tablet Take by mouth daily.   Yes Historical Provider, MD  acetaminophen (TYLENOL) 500 MG tablet Take 1,000 mg by mouth 2 (two) times daily as needed (pain). For pain    Historical Provider, MD  amoxicillin (AMOXIL) 500 MG capsule Take 1  capsule (500 mg total) by mouth 3 (three) times daily. 05/19/15   Glendell Docker, NP  cefUROXime (CEFTIN) 500 MG tablet Take 1 tablet (500 mg total) by mouth 2 (two) times daily with a meal. x2 weeks 04/06/15   Ripudeep Krystal Eaton, MD  clotrimazole (GYNE-LOTRIMIN 3) 2 % vaginal cream Place 1 Applicatorful vaginally at bedtime. X 3 days 04/06/15   Ripudeep Krystal Eaton, MD  diphenhydrAMINE (BENADRYL) 25 mg capsule Take 1 capsule (25 mg total) by mouth every 6 (six) hours as needed. For allergies 04/06/15   Ripudeep Krystal Eaton, MD  doxycycline (VIBRA-TABS) 100 MG tablet Take 1 tablet (100 mg total) by mouth 2 (two) times daily. X 2 WEEKS 04/06/15   Ripudeep Krystal Eaton, MD  HYDROcodone-homatropine (HYDROMET) 5-1.5 MG/5ML syrup Take 5 mLs by mouth every 6 (six) hours as needed for cough. 05/19/15   Glendell Docker, NP  ibuprofen (ADVIL,MOTRIN) 600 MG tablet Take 1 tablet (600 mg total) by mouth every 6 (six) hours as needed. Patient not taking: Reported on 01/02/2015 10/31/14   Deirdre C Poe, CNM  Ketotifen Fumarate (ALLERGY EYE DROPS OP) Apply 1 drop to eye daily as needed. Only on right eye.    Historical Provider, MD  loratadine (CLARITIN) 10 MG tablet Take 1 tablet (10 mg total) by mouth daily. Also available over the counter 04/06/15   Ripudeep Krystal Eaton, MD  metroNIDAZOLE (METROGEL) 0.75 % vaginal gel Place 1 Applicatorful vaginally at bedtime. x7 days 04/06/15   Ripudeep Krystal Eaton, MD  oxyCODONE (OXY IR/ROXICODONE) 5 MG immediate release tablet Take 1-2 tablets (5-10 mg total) by mouth every 4 (four) hours as needed for moderate pain. 04/06/15   Ripudeep Krystal Eaton, MD  OxyCODONE (OXYCONTIN) 15 mg T12A 12 hr tablet Take 1 tablet (15 mg total) by mouth every 12 (twelve) hours. Further refills by PCP or hand surgery if needed. 04/06/15   Ripudeep Krystal Eaton, MD  promethazine (PHENERGAN) 12.5 MG tablet Take 1 tablet (12.5 mg total) by mouth every 6 (six) hours as needed for nausea or vomiting. 04/06/15   Ripudeep K Rai, MD   BP 107/58 mmHg  Pulse 92   Temp(Src) 98.3 F (36.8 C) (Oral)  Resp 18  Ht 5\' 2"  (1.575 m)  Wt 230 lb (104.327 kg)  BMI 42.06 kg/m2  SpO2 100%  LMP 05/01/2015 Physical Exam  Constitutional: She is oriented to person, place, and time. She appears well-developed and well-nourished.  HENT:  Right Ear: External ear normal.  Left Ear: External ear normal.  Nose: Mucosal edema and rhinorrhea present. Right sinus exhibits maxillary sinus tenderness and frontal sinus tenderness.  Mouth/Throat: Posterior oropharyngeal erythema present.  Eyes: Conjunctivae are normal. Pupils are equal, round, and reactive to light.  Cardiovascular: Normal rate and regular rhythm.   Pulmonary/Chest: Effort normal and breath sounds normal. No respiratory distress. She has no wheezes.  Musculoskeletal: Normal range of motion.  Neurological: She is alert and oriented to person, place, and time.  Skin: Skin is warm and dry.  Psychiatric: She has a normal mood and affect.  Nursing note and vitals reviewed.   ED Course  Procedures (including critical care time) Labs Review Labs Reviewed - No data to display  Imaging Review Dg Chest 2 View  05/19/2015   CLINICAL DATA:  Cough.  EXAM: CHEST  2 VIEW  COMPARISON:  Apr 05, 2015.  FINDINGS: The heart size and mediastinal contours are within normal limits. Both lungs are clear. No pneumothorax or pleural effusion is noted. The visualized skeletal structures are unremarkable.  IMPRESSION: No active cardiopulmonary disease.   Electronically Signed   By: Marijo Conception, M.D.   On: 05/19/2015 11:47     EKG Interpretation None      MDM   Final diagnoses:  Cough  Other acute sinusitis    Will treat for sinusitis and cough. Discussed symptomatic relief at home.    Glendell Docker, NP 05/19/15 1243  Pattricia Boss, MD 05/21/15 1510

## 2015-05-23 ENCOUNTER — Emergency Department (HOSPITAL_BASED_OUTPATIENT_CLINIC_OR_DEPARTMENT_OTHER): Payer: Medicaid Other

## 2015-05-23 ENCOUNTER — Encounter (HOSPITAL_BASED_OUTPATIENT_CLINIC_OR_DEPARTMENT_OTHER): Payer: Self-pay | Admitting: *Deleted

## 2015-05-23 ENCOUNTER — Emergency Department (HOSPITAL_BASED_OUTPATIENT_CLINIC_OR_DEPARTMENT_OTHER)
Admission: EM | Admit: 2015-05-23 | Discharge: 2015-05-23 | Disposition: A | Discharge status: Home / Self Care | Payer: Medicaid Other | Attending: Emergency Medicine | Admitting: Emergency Medicine

## 2015-05-23 DIAGNOSIS — Z87891 Personal history of nicotine dependence: Secondary | ICD-10-CM | POA: Diagnosis not present

## 2015-05-23 DIAGNOSIS — Z8742 Personal history of other diseases of the female genital tract: Secondary | ICD-10-CM | POA: Diagnosis not present

## 2015-05-23 DIAGNOSIS — Z8739 Personal history of other diseases of the musculoskeletal system and connective tissue: Secondary | ICD-10-CM | POA: Insufficient documentation

## 2015-05-23 DIAGNOSIS — Z792 Long term (current) use of antibiotics: Secondary | ICD-10-CM | POA: Insufficient documentation

## 2015-05-23 DIAGNOSIS — Z8709 Personal history of other diseases of the respiratory system: Secondary | ICD-10-CM | POA: Diagnosis not present

## 2015-05-23 DIAGNOSIS — R05 Cough: Secondary | ICD-10-CM | POA: Diagnosis not present

## 2015-05-23 DIAGNOSIS — Z8619 Personal history of other infectious and parasitic diseases: Secondary | ICD-10-CM | POA: Diagnosis not present

## 2015-05-23 DIAGNOSIS — R059 Cough, unspecified: Secondary | ICD-10-CM

## 2015-05-23 MED ORDER — ALBUTEROL SULFATE (2.5 MG/3ML) 0.083% IN NEBU
5.0000 mg | INHALATION_SOLUTION | Freq: Once | RESPIRATORY_TRACT | Status: DC
  Filled 2015-05-23: qty 6

## 2015-05-23 MED ORDER — ALBUTEROL SULFATE (2.5 MG/3ML) 0.083% IN NEBU
2.5000 mg | INHALATION_SOLUTION | Freq: Once | RESPIRATORY_TRACT | Status: AC
  Administered 2015-05-23: 2.5 mg via RESPIRATORY_TRACT
  Filled 2015-05-23: qty 3

## 2015-05-23 MED ORDER — BENZONATATE 100 MG PO CAPS: 100.0000 mg | ORAL_CAPSULE | Freq: Three times a day (TID) | ORAL | Status: DC

## 2015-05-23 MED ORDER — IPRATROPIUM-ALBUTEROL 0.5-2.5 (3) MG/3ML IN SOLN
3.0000 mL | Freq: Once | RESPIRATORY_TRACT | Status: AC
Start: 2015-05-23 — End: 2015-05-23
  Administered 2015-05-23: 3 mL via RESPIRATORY_TRACT
  Filled 2015-05-23: qty 3

## 2015-05-23 NOTE — ED Notes (Signed)
Patient transported to X-ray 

## 2015-05-23 NOTE — ED Notes (Signed)
For duration of HHN patient with no spontaneous cough.  When [facility] terminated patient began coughing, clear secretions.

## 2015-05-23 NOTE — ED Notes (Signed)
Patient states she has had a cough since Apr 04, 2015 after having surgery.  States she was seen here four days ago and diagnosed with a sinus infection.   States the cough continues causing her to "vomiting" from the coughing and to have urinary incontinency from the coughing.

## 2015-05-23 NOTE — ED Provider Notes (Signed)
CSN: [id number]     Arrival date & time 05/23/15  1030 History   First MD Initiated Contact with Patient 05/23/15 1046     Chief Complaint  Patient presents with  . 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 This was treated with surgery approximately one month ago. She states since that time she has had a persistent cough that has been worsening. She was seen here several days ago and was told she had bronchitis or a sinus infection. She is currently on antibiotics. She reports she continues to cough to the point that she either vomits or urinates on herself. She has no prior history of asthma.  Patient is a 34 y.o. female presenting with cough. The history is provided by the patient.  Cough Cough characteristics:  Non-productive Severity:  Moderate Onset quality:  Sudden Duration:  4 weeks Timing:  Constant Progression:  Worsening Chronicity:  New Smoker: no   Relieved by:  Nothing Worsened by:  Nothing tried Ineffective treatments:  None tried   Past Medical History  Diagnosis Date  . Abnormal Pap smear   . Chlamydia trachomatis infection 2012  . Bacterial vaginosis    Past Surgical History  Procedure Laterality Date  . Gynecologic cryosurgery    . Tubal ligation    . Wisdom tooth extraction    . I&d extremity Right 04/04/2015    Procedure: IRRIGATION AND DEBRIDEMENT RIGHT WRIST;  Surgeon: Leanora Cover, MD;  Location: [place];  Service: Orthopedics;  Laterality: Right;  . Wrist surgery     Family History  Problem Relation Age of Onset  . Hypertension Mother    History  Substance Use Topics  . Smoking status: Former Research scientist (life sciences)  . Smokeless tobacco: Never Used  . Alcohol Use: Yes   OB History    Gravida Para Term Preterm AB TAB SAB Ectopic Multiple Living   3 3 3  0 0 0 0 0 0 3     Review of Systems   Respiratory: Positive for cough.   All other systems reviewed and are negative.     Allergies  Clindamycin/lincomycin and Flagyl  Home Medications   Prior to Admission medications   Medication Sig Start Date End Date Taking? Authorizing Provider  acetaminophen (TYLENOL) 500 MG tablet Take 1,000 mg by mouth 2 (two) times daily as needed (pain). For pain    Historical Provider, MD  amoxicillin (AMOXIL) 500 MG capsule Take 1 capsule (500 mg total) by mouth 3 (three) times daily. 05/19/15   Glendell Docker, NP  azithromycin (ZITHROMAX) 500 MG tablet Take by mouth daily.    Historical Provider, MD  cefUROXime (CEFTIN) 500 MG tablet Take 1 tablet (500 mg total) by mouth 2 (two) times daily with a meal. x2 weeks 04/06/15   Ripudeep Krystal Eaton, MD  clotrimazole (GYNE-LOTRIMIN 3) 2 % vaginal cream Place 1 Applicatorful vaginally at bedtime. X 3 days 04/06/15   Ripudeep Krystal Eaton, MD  diphenhydrAMINE (BENADRYL) 25 mg capsule Take 1 capsule (25 mg total) by mouth every 6 (six) hours as needed. For allergies 04/06/15   Ripudeep Krystal Eaton, MD  doxycycline (VIBRA-TABS) 100 MG tablet Take 1 tablet (100 mg total) by mouth 2 (two) times daily. X 2 WEEKS 04/06/15   Ripudeep Krystal Eaton, MD  HYDROcodone-homatropine (HYDROMET) 5-1.5 MG/5ML syrup Take 5 mLs by mouth every 6 (six) hours as needed for cough. 05/19/15   Tamala Julian  Pickering, NP  ibuprofen (ADVIL,MOTRIN) 600 MG tablet Take 1 tablet (600 mg total) by mouth every 6 (six) hours as needed. Patient not taking: Reported on 01/02/2015 10/31/14   Deirdre C Poe, CNM  Ketotifen Fumarate (ALLERGY EYE DROPS OP) Apply 1 drop to eye daily as needed. Only on right eye.    Historical Provider, MD  loratadine (CLARITIN) 10 MG tablet Take 1 tablet (10 mg total) by mouth daily. Also available over the counter 04/06/15   Ripudeep K Rai, MD  metroNIDAZOLE (METROGEL) 0.75 % vaginal gel Place 1 Applicatorful vaginally at bedtime. x7 days 04/06/15   Ripudeep Krystal Eaton, MD  oxyCODONE (OXY IR/ROXICODONE) 5 MG  immediate release tablet Take 1-2 tablets (5-10 mg total) by mouth every 4 (four) hours as needed for moderate pain. 04/06/15   Ripudeep Krystal Eaton, MD  OxyCODONE (OXYCONTIN) 15 mg T12A 12 hr tablet Take 1 tablet (15 mg total) by mouth every 12 (twelve) hours. Further refills by PCP or hand surgery if needed. 04/06/15   Ripudeep Krystal Eaton, MD  promethazine (PHENERGAN) 12.5 MG tablet Take 1 tablet (12.5 mg total) by mouth every 6 (six) hours as needed for nausea or vomiting. 04/06/15   Ripudeep Krystal Eaton, MD   BP 117/86 mmHg  Pulse 111  Temp(Src) 98.7 F (37.1 C) (Oral)  Ht 5\' 2"  (1.575 m)  Wt 230 lb (104.327 kg)  BMI 42.06 kg/m2  SpO2 100%  LMP 05/01/2015 Physical Exam  Constitutional: She is oriented to person, place, and time. She appears well-developed and well-nourished. No distress.  HENT:  Head: Normocephalic and atraumatic.  Neck: Normal range of motion. Neck supple.  Cardiovascular: Normal rate and regular rhythm.  Exam reveals no gallop and no friction rub.   No murmur heard. Pulmonary/Chest: Effort normal and breath sounds normal. No respiratory distress. She has no wheezes.  Abdominal: Soft. Bowel sounds are normal. She exhibits no distension. There is no tenderness.  Musculoskeletal: Normal range of motion.  Neurological: She is alert and oriented to person, place, and time.  Skin: Skin is warm and dry. She is not diaphoretic.  Nursing note and vitals reviewed.   ED Course  Procedures (including critical care time) Labs Review Labs Reviewed - No data to display  Imaging Review No results found.   EKG Interpretation None      MDM   Final diagnoses:  None    Patient returns with complaints of persistent cough. She was given a nebulizer treatment which appears to have helped. Her lungs are clear and chest x-ray reveals no acute process. She states that this has been ongoing since she was intubated for a surgery approximately 6 weeks ago. She has been spitting into a basin since  she has been here and this appears to be mostly saliva. I see no stomach contents or purulent phlegm. She will be given Tessalon to help with her cough and advised to follow-up with her Dr. if not improving.    Veryl Speak, MD 05/23/15 1251

## 2015-05-23 NOTE — Discharge Instructions (Signed)
Tessalon as prescribed as needed for cough.  Follow-up with your primary Dr. if not improving in the next few days.   Cough, Adult  A cough is a reflex that helps clear your throat and airways. It can help heal the body or may be a reaction to an irritated airway. A cough may only last 2 or 3 weeks (acute) or may last more than 8 weeks (chronic).  CAUSES Acute cough:  Viral or bacterial infections. Chronic cough:  Infections.  Allergies.  Asthma.  Post-nasal drip.  Smoking.  Heartburn or acid reflux.  Some medicines.  Chronic lung problems (COPD).  Cancer. SYMPTOMS   Cough.  Fever.  Chest pain.  Increased breathing rate.  High-pitched whistling sound when breathing (wheezing).  Colored mucus that you cough up (sputum). TREATMENT   A bacterial cough may be treated with antibiotic medicine.  A viral cough must run its course and will not respond to antibiotics.  Your caregiver may recommend other treatments if you have a chronic cough. HOME CARE INSTRUCTIONS   Only take over-the-counter or prescription medicines for pain, discomfort, or fever as directed by your caregiver. Use cough suppressants only as directed by your caregiver.  Use a cold steam vaporizer or humidifier in your bedroom or home to help loosen secretions.  Sleep in a semi-upright position if your cough is worse at night.  Rest as needed.  Stop smoking if you smoke. SEEK IMMEDIATE MEDICAL CARE IF:   You have pus in your sputum.  Your cough starts to worsen.  You cannot control your cough with suppressants and are losing sleep.  You begin coughing up blood.  You have difficulty breathing.  You develop pain which is getting worse or is uncontrolled with medicine.  You have a fever. MAKE SURE YOU:   Understand these instructions.  Will watch your condition.  Will get help right away if you are not doing well or get worse. Document Released: 05/15/2011 Document Revised:  02/08/2012 Document Reviewed: 05/15/2011 [facility] Patient Information 2015 [place], [place]. This information is not intended to replace advice given to you by your health care provider. Make sure you discuss any questions you have with your health care provider.

## 2015-09-25 ENCOUNTER — Emergency Department (HOSPITAL_BASED_OUTPATIENT_CLINIC_OR_DEPARTMENT_OTHER)
Admission: EM | Admit: 2015-09-25 | Discharge: 2015-09-25 | Disposition: A | Discharge status: Home / Self Care | Payer: Medicaid Other | Attending: Emergency Medicine | Admitting: Emergency Medicine

## 2015-09-25 ENCOUNTER — Encounter (HOSPITAL_BASED_OUTPATIENT_CLINIC_OR_DEPARTMENT_OTHER): Payer: Self-pay | Admitting: Emergency Medicine

## 2015-09-25 ENCOUNTER — Emergency Department (HOSPITAL_BASED_OUTPATIENT_CLINIC_OR_DEPARTMENT_OTHER): Payer: Medicaid Other

## 2015-09-25 DIAGNOSIS — Z87891 Personal history of nicotine dependence: Secondary | ICD-10-CM | POA: Diagnosis not present

## 2015-09-25 DIAGNOSIS — I10 Essential (primary) hypertension: Secondary | ICD-10-CM | POA: Diagnosis not present

## 2015-09-25 DIAGNOSIS — Z79899 Other long term (current) drug therapy: Secondary | ICD-10-CM | POA: Diagnosis not present

## 2015-09-25 DIAGNOSIS — R05 Cough: Secondary | ICD-10-CM | POA: Insufficient documentation

## 2015-09-25 DIAGNOSIS — Z792 Long term (current) use of antibiotics: Secondary | ICD-10-CM | POA: Insufficient documentation

## 2015-09-25 DIAGNOSIS — R053 Chronic cough: Secondary | ICD-10-CM

## 2015-09-25 DIAGNOSIS — Z8742 Personal history of other diseases of the female genital tract: Secondary | ICD-10-CM | POA: Diagnosis not present

## 2015-09-25 DIAGNOSIS — Z8619 Personal history of other infectious and parasitic diseases: Secondary | ICD-10-CM | POA: Diagnosis not present

## 2015-09-25 DIAGNOSIS — R079 Chest pain, unspecified: Secondary | ICD-10-CM | POA: Insufficient documentation

## 2015-09-25 NOTE — ED Notes (Signed)
Pt having problems since May with her sinus passages.   Pt states she has been treated sinus infection.  Pt has been having cough recently and coughing up yellow green sputum.  No known fever.

## 2015-09-25 NOTE — ED Provider Notes (Signed)
CSN: [id number]     Arrival date & time 09/25/15  0754 History   First MD Initiated Contact with Patient 09/25/15 0800     No chief complaint on file.  Chief complaint chest pain with cough  (Consider location/radiation/quality/duration/timing/severity/associated sxs/prior Treatment) HPI Complains of cough productive of yellow sputum onset May 2016. Constant. Has chest pain at left parasternal area worse with coughing. No fever. No other associated symptoms. Has been treated with Levaquin, without relief. Nothing makes symptoms better or worse. Past Medical History  Diagnosis Date  . Abnormal Pap smear   . Chlamydia trachomatis infection 2012  . Bacterial vaginosis    hypertension Past Surgical History  Procedure Laterality Date  . Gynecologic cryosurgery    . Tubal ligation    . Wisdom tooth extraction    . I&d extremity Right 04/04/2015    Procedure: IRRIGATION AND DEBRIDEMENT RIGHT WRIST;  Surgeon: Leanora Cover, MD;  Location: [facility];  Service: Orthopedics;  Laterality: Right;  . Wrist surgery     Family History  Problem Relation Age of Onset  . Hypertension Mother    Social History  Substance Use Topics  . Smoking status: Former Research scientist (life sciences)  . Smokeless tobacco: Never Used  . Alcohol Use: Yes   OB History    Gravida Para Term Preterm AB TAB SAB Ectopic Multiple Living   3 3 3  0 0 0 0 0 0 3     Review of Systems  Constitutional: Negative.   HENT: Negative.   Respiratory: Positive for cough.   Cardiovascular: Positive for chest pain.  Gastrointestinal: Negative.   Musculoskeletal: Negative.   Skin: Negative.   Neurological: Negative.   Psychiatric/Behavioral: Negative.   All other systems reviewed and are negative.     Allergies  Clindamycin/lincomycin and Flagyl  Home Medications   Prior to Admission medications   Medication Sig Start Date End Date Taking? Authorizing Provider  acetaminophen (TYLENOL) 500 MG tablet Take 1,000 mg by mouth 2 (two) times daily as  needed (pain). For pain    Historical Provider, MD  amoxicillin (AMOXIL) 500 MG capsule Take 1 capsule (500 mg total) by mouth 3 (three) times daily. 05/19/15   Glendell Docker, NP  azithromycin (ZITHROMAX) 500 MG tablet Take by mouth daily.    Historical Provider, MD  benzonatate (TESSALON) 100 MG capsule Take 1 capsule (100 mg total) by mouth every 8 (eight) hours. 05/23/15   Veryl Speak, MD  cefUROXime (CEFTIN) 500 MG tablet Take 1 tablet (500 mg total) by mouth 2 (two) times daily with a meal. x2 weeks 04/06/15   Ripudeep Krystal Eaton, MD  clotrimazole (GYNE-LOTRIMIN 3) 2 % vaginal cream Place 1 Applicatorful vaginally at bedtime. X 3 days 04/06/15   Ripudeep Krystal Eaton, MD  diphenhydrAMINE (BENADRYL) 25 mg capsule Take 1 capsule (25 mg total) by mouth every 6 (six) hours as needed. For allergies 04/06/15   Ripudeep Krystal Eaton, MD  doxycycline (VIBRA-TABS) 100 MG tablet Take 1 tablet (100 mg total) by mouth 2 (two) times daily. X 2 WEEKS 04/06/15   Ripudeep Krystal Eaton, MD  HYDROcodone-homatropine (HYDROMET) 5-1.5 MG/5ML syrup Take 5 mLs by mouth every 6 (six) hours as needed for cough. 05/19/15   Glendell Docker, NP  ibuprofen (ADVIL,MOTRIN) 600 MG tablet Take 1 tablet (600 mg total) by mouth every 6 (six) hours as needed. Patient not taking: Reported on 01/02/2015 10/31/14   Deirdre C Poe, CNM  Ketotifen Fumarate (ALLERGY EYE DROPS OP) Apply 1 drop to eye daily  as needed. Only on right eye.    Historical Provider, MD  loratadine (CLARITIN) 10 MG tablet Take 1 tablet (10 mg total) by mouth daily. Also available over the counter 04/06/15   Ripudeep K Rai, MD  metroNIDAZOLE (METROGEL) 0.75 % vaginal gel Place 1 Applicatorful vaginally at bedtime. x7 days 04/06/15   Ripudeep Krystal Eaton, MD  oxyCODONE (OXY IR/ROXICODONE) 5 MG immediate release tablet Take 1-2 tablets (5-10 mg total) by mouth every 4 (four) hours as needed for moderate pain. 04/06/15   Ripudeep Krystal Eaton, MD  OxyCODONE (OXYCONTIN) 15 mg T12A 12 hr tablet Take 1 tablet (15 mg total)  by mouth every 12 (twelve) hours. Further refills by PCP or hand surgery if needed. 04/06/15   Ripudeep Krystal Eaton, MD  promethazine (PHENERGAN) 12.5 MG tablet Take 1 tablet (12.5 mg total) by mouth every 6 (six) hours as needed for nausea or vomiting. 04/06/15   Ripudeep Krystal Eaton, MD   There were no vitals taken for this visit. Physical Exam  Constitutional: She appears well-developed and well-nourished.  HENT:  Head: Normocephalic and atraumatic.  Eyes: Conjunctivae are normal. Pupils are equal, round, and reactive to light.  Neck: Neck supple. No tracheal deviation present. No thyromegaly present.  Cardiovascular: Normal rate and regular rhythm.   No murmur heard. Pulmonary/Chest: Effort normal and breath sounds normal.  Tender at left parasternal area, reproducing pain exactly  Abdominal: Soft. Bowel sounds are normal. She exhibits no distension. There is no tenderness.  Obese  Musculoskeletal: Normal range of motion. She exhibits no edema or tenderness.  Neurological: She is alert. Coordination normal.  Skin: Skin is warm and dry. No rash noted.  Multiple piercings and tattoos  Psychiatric: She has a normal mood and affect.  Nursing note and vitals reviewed.   ED Course  Procedures (including critical care time) Labs Review Labs Reviewed - No data to display  Imaging Review No results found. I have personally reviewed and evaluated these images and lab results as part of my medical decision-making.   EKG Interpretation   Date/Time:  Wednesday September 25 2015 08:01:54 EDT Ventricular Rate:  78 PR Interval:  160 QRS Duration: 84 QT Interval:  356 QTC Calculation: 405 R Axis:   80 Text Interpretation:  Normal sinus rhythm Normal ECG Since last tracing  rate slower Confirmed by Winfred Leeds  MD, Alper Guilmette [phone]) on 09/25/2015 8:03:39  AM     Chest x-ray viewed by me Results for orders placed or performed during the hospital encounter of 04/04/15  Wet prep, genital  Result Value Ref  Range   Yeast Wet Prep HPF POC NONE SEEN NONE SEEN   Trich, Wet Prep NONE SEEN NONE SEEN   Clue Cells Wet Prep HPF POC MODERATE (A) NONE SEEN   WBC, Wet Prep HPF POC MANY (A) NONE SEEN  Culture, blood (routine x 2)  Result Value Ref Range   Specimen Description BLOOD RIGHT ARM    Special Requests BOTTLES DRAWN AEROBIC AND ANAEROBIC 10 CC EACH    Culture      NO GROWTH 5 DAYS Performed at [facility]    Report Status 04/10/2015 FINAL   Culture, blood (routine x 2)  Result Value Ref Range   Specimen Description BLOOD RIGHT ARM    Special Requests BOTTLES DRAWN AEROBIC AND ANAEROBIC 5CC EACH    Culture      NO GROWTH 5 DAYS Performed at [facility]    Report Status 04/10/2015 FINAL  Anaerobic culture  Result Value Ref Range   Specimen Description ABSCESS  RIGHT WRIST    Special Requests A    Gram Stain      FEW WBC PRESENT,BOTH PMN AND MONONUCLEAR NO SQUAMOUS EPITHELIAL CELLS SEEN NO ORGANISMS SEEN Performed at [facility]    Culture      NO ANAEROBES ISOLATED Performed at [facility]    Report Status 04/09/2015 FINAL   Anaerobic culture  Result Value Ref Range   Specimen Description TISSUE    Special Requests RIGHT WRIST TENOSYNOVIUM    Gram Stain      RARE WBC PRESENT, PREDOMINANTLY MONONUCLEAR NO ORGANISMS SEEN Performed at [facility]    Culture      NO ANAEROBES ISOLATED Performed at [facility]    Report Status 04/09/2015 FINAL   Anaerobic culture  Result Value Ref Range   Specimen Description TISSUE    Special Requests RIGHT WRIST JOINT TISSUE    Gram Stain      NO WBC SEEN NO ORGANISMS SEEN Performed at [facility]    Culture      NO ANAEROBES ISOLATED Performed at [facility]    Report Status 04/09/2015 FINAL   Tissue culture  Result Value Ref Range   Specimen Description TISSUE    Special Requests RIGHT WRIST TENOSYNOVIUM    Gram Stain      RARE WBC PRESENT,  PREDOMINANTLY MONONUCLEAR NO ORGANISMS SEEN Performed at [facility]    Culture      NO GROWTH 3 DAYS Performed at [facility]    Report Status 04/08/2015 FINAL   Tissue culture  Result Value Ref Range   Specimen Description TISSUE    Special Requests RIGHT WRIST JOINT TISSUE    Gram Stain      NO WBC SEEN NO ORGANISMS SEEN Performed at [facility]    Culture      NO GROWTH 3 DAYS Performed at [facility]    Report Status 04/08/2015 FINAL   Culture, routine-abscess  Result Value Ref Range   Specimen Description ABSCESS RIGHT WRIST    Special Requests A    Gram Stain      FEW WBC PRESENT,BOTH PMN AND MONONUCLEAR NO SQUAMOUS EPITHELIAL CELLS SEEN NO ORGANISMS SEEN Performed at [facility]    Culture      NO GROWTH 3 DAYS Performed at [facility]    Report Status 04/08/2015 FINAL   Culture, blood (x 2)  Result Value Ref Range   Specimen Description BLOOD LEFT HAND    Special Requests BOTTLES DRAWN AEROBIC ONLY 10CC    Culture      NO GROWTH 5 DAYS Note: Culture results may be compromised due to an excessive volume of blood received in culture bottles. Performed at [facility]    Report Status 04/11/2015 FINAL   Culture, blood (x 2)  Result Value Ref Range   Specimen Description BLOOD LEFT WRIST    Special Requests BOTTLES DRAWN AEROBIC ONLY 5CC    Culture      NO GROWTH 5 DAYS Performed at [facility]    Report Status 04/11/2015 FINAL   CBC with Differential/Platelet  Result Value Ref Range   WBC 11.9 (H) 4.0 - 10.5 K/uL   RBC 4.33 3.87 - 5.11 MIL/uL   Hemoglobin 11.2 (L) 12.0 - 15.0 g/dL   HCT 35.3 (L) 36.0 - 46.0 %   MCV 81.5 78.0 -  100.0 fL   MCH 25.9 (L) 26.0 - 34.0 pg   MCHC 31.7 30.0 - 36.0 g/dL   RDW 15.1 11.5 - 15.5 %   Platelets 343 150 - 400 K/uL   Neutrophils Relative % 66 43 - 77 %   Neutro Abs 7.8 (H) 1.7 - 7.7 K/uL   Lymphocytes Relative 21 12 - 46 %   Lymphs Abs  2.5 0.7 - 4.0 K/uL   Monocytes Relative 9 3 - 12 %   Monocytes Absolute 1.1 (H) 0.1 - 1.0 K/uL   Eosinophils Relative 4 0 - 5 %   Eosinophils Absolute 0.5 0.0 - 0.7 K/uL   Basophils Relative 0 0 - 1 %   Basophils Absolute 0.0 0.0 - 0.1 K/uL  Comprehensive metabolic panel  Result Value Ref Range   Sodium 137 135 - 145 mmol/L   Potassium 3.9 3.5 - 5.1 mmol/L   Chloride 101 101 - 111 mmol/L   CO2 30 22 - 32 mmol/L   Glucose, Bld 89 70 - 99 mg/dL   BUN 11 6 - 20 mg/dL   Creatinine, Ser 0.77 0.44 - 1.00 mg/dL   Calcium 9.1 8.9 - 10.3 mg/dL   Total Protein 7.7 6.5 - 8.1 g/dL   Albumin 3.4 (L) 3.5 - 5.0 g/dL   AST 31 15 - 41 U/L   ALT 33 14 - 54 U/L   Alkaline Phosphatase 82 38 - 126 U/L   Total Bilirubin 0.4 0.3 - 1.2 mg/dL   GFR calc non Af Amer >60 >60 mL/min   GFR calc Af Amer >60 >60 mL/min   Anion gap 6 5 - 15  Sedimentation rate  Result Value Ref Range   Sed Rate 61 (H) 0 - 22 mm/hr  RPR  Result Value Ref Range   RPR Ser Ql Non Reactive Non Reactive  HIV antibody  Result Value Ref Range   HIV Screen 4th Generation wRfx Non Reactive Non Reactive  Pregnancy, urine  Result Value Ref Range   Preg Test, Ur NEGATIVE NEGATIVE  Urinalysis, Routine w reflex microscopic  Result Value Ref Range   Color, Urine YELLOW YELLOW   APPearance CLEAR CLEAR   Specific Gravity, Urine 1.021 1.005 - 1.030   pH 6.0 5.0 - 8.0   Glucose, UA NEGATIVE NEGATIVE mg/dL   Hgb urine dipstick NEGATIVE NEGATIVE   Bilirubin Urine NEGATIVE NEGATIVE   Ketones, ur NEGATIVE NEGATIVE mg/dL   Protein, ur NEGATIVE NEGATIVE mg/dL   Urobilinogen, UA 0.2 0.0 - 1.0 mg/dL   Nitrite NEGATIVE NEGATIVE   Leukocytes, UA SMALL (A) NEGATIVE  Urine microscopic-add on  Result Value Ref Range   Squamous Epithelial / LPF RARE RARE   WBC, UA 7-10 <3 WBC/hpf   RBC / HPF 0-2 <3 RBC/hpf   Bacteria, UA RARE RARE   Urine-Other MUCOUS PRESENT   CBC with Differential  Result Value Ref Range   WBC 11.1 (H) 4.0 - 10.5 K/uL    RBC 3.99 3.87 - 5.11 MIL/uL   Hemoglobin 10.3 (L) 12.0 - 15.0 g/dL   HCT 33.0 (L) 36.0 - 46.0 %   MCV 82.7 78.0 - 100.0 fL   MCH 25.8 (L) 26.0 - 34.0 pg   MCHC 31.2 30.0 - 36.0 g/dL   RDW 15.0 11.5 - 15.5 %   Platelets 328 150 - 400 K/uL   Neutrophils Relative % 72 43 - 77 %   Neutro Abs 7.9 (H) 1.7 - 7.7 K/uL   Lymphocytes Relative 19 12 -  46 %   Lymphs Abs 2.1 0.7 - 4.0 K/uL   Monocytes Relative 7 3 - 12 %   Monocytes Absolute 0.8 0.1 - 1.0 K/uL   Eosinophils Relative 2 0 - 5 %   Eosinophils Absolute 0.3 0.0 - 0.7 K/uL   Basophils Relative 0 0 - 1 %   Basophils Absolute 0.0 0.0 - 0.1 K/uL  Comprehensive metabolic panel  Result Value Ref Range   Sodium 139 135 - 145 mmol/L   Potassium 3.7 3.5 - 5.1 mmol/L   Chloride 104 101 - 111 mmol/L   CO2 25 22 - 32 mmol/L   Glucose, Bld 122 (H) 70 - 99 mg/dL   BUN 6 6 - 20 mg/dL   Creatinine, Ser 0.70 0.44 - 1.00 mg/dL   Calcium 8.5 (L) 8.9 - 10.3 mg/dL   Total Protein 6.6 6.5 - 8.1 g/dL   Albumin 2.8 (L) 3.5 - 5.0 g/dL   AST 27 15 - 41 U/L   ALT 29 14 - 54 U/L   Alkaline Phosphatase 74 38 - 126 U/L   Total Bilirubin 0.7 0.3 - 1.2 mg/dL   GFR calc non Af Amer >60 >60 mL/min   GFR calc Af Amer >60 >60 mL/min   Anion gap 10 5 - 15  Lactic acid, plasma  Result Value Ref Range   Lactic Acid, Venous 0.9 0.5 - 2.0 mmol/L  Lactic acid, plasma  Result Value Ref Range   Lactic Acid, Venous 0.9 0.5 - 2.0 mmol/L  Procalcitonin  Result Value Ref Range   Procalcitonin <0.10 ng/mL  Protime-INR  Result Value Ref Range   Prothrombin Time 14.3 11.6 - 15.2 seconds   INR 1.10 0.00 - 1.49  APTT  Result Value Ref Range   aPTT 33 24 - 37 seconds  Glucose, capillary  Result Value Ref Range   Glucose-Capillary 74 70 - 99 mg/dL  CBC  Result Value Ref Range   WBC 10.4 4.0 - 10.5 K/uL   RBC 4.37 3.87 - 5.11 MIL/uL   Hemoglobin 11.1 (L) 12.0 - 15.0 g/dL   HCT 36.2 36.0 - 46.0 %   MCV 82.8 78.0 - 100.0 fL   MCH 25.4 (L) 26.0 - 34.0 pg    MCHC 30.7 30.0 - 36.0 g/dL   RDW 15.1 11.5 - 15.5 %   Platelets 366 150 - 400 K/uL  Basic metabolic panel  Result Value Ref Range   Sodium 137 135 - 145 mmol/L   Potassium 4.0 3.5 - 5.1 mmol/L   Chloride 102 101 - 111 mmol/L   CO2 28 22 - 32 mmol/L   Glucose, Bld 97 70 - 99 mg/dL   BUN 7 6 - 20 mg/dL   Creatinine, Ser 0.88 0.44 - 1.00 mg/dL   Calcium 8.8 (L) 8.9 - 10.3 mg/dL   GFR calc non Af Amer >60 >60 mL/min   GFR calc Af Amer >60 >60 mL/min   Anion gap 7 5 - 15  Glucose, capillary  Result Value Ref Range   Glucose-Capillary 99 70 - 99 mg/dL  GC/Chlamydia probe amp ([place])  Result Value Ref Range   Chlamydia **POSITIVE** (A)    Neisseria gonorrhea **POSITIVE** (A)    Dg Chest 2 View  09/25/2015  CLINICAL DATA:  Productive cough for 5 months. EXAM: CHEST  2 VIEW COMPARISON:  May 23, 2015. FINDINGS: The heart size and mediastinal contours are within normal limits. Both lungs are clear. No pneumothorax or pleural effusion is noted. The  visualized skeletal structures are unremarkable. IMPRESSION: No active cardiopulmonary disease. Electronically Signed   By: Marijo Conception, M.D.   On: 09/25/2015 08:35    MDM  Patient reports that she's been on antibiotics, antihistamines, and inhalers, without change in cough. She appears in no distress normal pulse ox normal respiratory rate lungs clear to auscultation. I don't feel that it secondary to lisinopril as she started lisinopril only one week ago. Suggest follow-up with Dr. Ayesha Rumpf Diagnosis chronic cough Final diagnoses:  None        Orlie Dakin, MD 09/25/15 [phone]

## 2015-09-25 NOTE — ED Notes (Signed)
Patient ambulates to radiology dept.

## 2015-09-25 NOTE — Discharge Instructions (Signed)
Stop antibiotic (levaquin), as we don't feel that it's helping. Call Dr. Ayesha Rumpf today to schedule follow-up appointment. Tell her that you were seen here. We stopped the antibiotic and that you had a chest x-ray which was normal. Cough, Adult A cough helps to clear your throat and lungs. A cough may last only 2-3 weeks (acute), or it may last longer than 8 weeks (chronic). Many different things can cause a cough. A cough may be a sign of an illness or another medical condition. HOME CARE  Pay attention to any changes in your cough.  Take medicines only as told by your doctor.  If you were prescribed an antibiotic medicine, take it as told by your doctor. Do not stop taking it even if you start to feel better.  Talk with your doctor before you try using a cough medicine.  Drink enough fluid to keep your pee (urine) clear or pale yellow.  If the air is dry, use a cold steam vaporizer or humidifier in your home.  Stay away from things that make you cough at work or at home.  If your cough is worse at night, try using extra pillows to raise your head up higher while you sleep.  Do not smoke, and try not to be around smoke. If you need help quitting, ask your doctor.  Do not have caffeine.  Do not drink alcohol.  Rest as needed. GET HELP IF:  You have new problems (symptoms).  You cough up yellow fluid (pus).  Your cough does not get better after 2-3 weeks, or your cough gets worse.  Medicine does not help your cough and you are not sleeping well.  You have pain that gets worse or pain that is not helped with medicine.  You have a fever.  You are losing weight and you do not know why.  You have night sweats. GET HELP RIGHT AWAY IF:  You cough up blood.  You have trouble breathing.  Your heartbeat is very fast.   This information is not intended to replace advice given to you by your health care provider. Make sure you discuss any questions you have with your health care  provider.   Document Released: 07/30/2011 Document Revised: 08/07/2015 Document Reviewed: 01/23/2015 Elsevier Interactive Patient Education [employer].

## 2015-10-11 ENCOUNTER — Other Ambulatory Visit: Payer: Self-pay | Admitting: Otolaryngology

## 2016-01-01 ENCOUNTER — Emergency Department (HOSPITAL_COMMUNITY)
Admission: EM | Admit: 2016-01-01 | Discharge: 2016-01-01 | Disposition: A | Discharge status: Home / Self Care | Payer: No Typology Code available for payment source | Attending: Emergency Medicine | Admitting: Emergency Medicine

## 2016-01-01 ENCOUNTER — Encounter (HOSPITAL_COMMUNITY): Payer: Self-pay | Admitting: Emergency Medicine

## 2016-01-01 ENCOUNTER — Emergency Department (HOSPITAL_COMMUNITY): Payer: No Typology Code available for payment source

## 2016-01-01 DIAGNOSIS — Z79899 Other long term (current) drug therapy: Secondary | ICD-10-CM | POA: Insufficient documentation

## 2016-01-01 DIAGNOSIS — Z87891 Personal history of nicotine dependence: Secondary | ICD-10-CM | POA: Insufficient documentation

## 2016-01-01 DIAGNOSIS — Y998 Other external cause status: Secondary | ICD-10-CM | POA: Diagnosis not present

## 2016-01-01 DIAGNOSIS — S4992XA Unspecified injury of left shoulder and upper arm, initial encounter: Secondary | ICD-10-CM | POA: Insufficient documentation

## 2016-01-01 DIAGNOSIS — Z7951 Long term (current) use of inhaled steroids: Secondary | ICD-10-CM | POA: Diagnosis not present

## 2016-01-01 DIAGNOSIS — M79602 Pain in left arm: Secondary | ICD-10-CM

## 2016-01-01 DIAGNOSIS — Z8619 Personal history of other infectious and parasitic diseases: Secondary | ICD-10-CM | POA: Insufficient documentation

## 2016-01-01 DIAGNOSIS — Y9389 Activity, other specified: Secondary | ICD-10-CM | POA: Diagnosis not present

## 2016-01-01 DIAGNOSIS — S59902A Unspecified injury of left elbow, initial encounter: Secondary | ICD-10-CM | POA: Insufficient documentation

## 2016-01-01 DIAGNOSIS — Z8742 Personal history of other diseases of the female genital tract: Secondary | ICD-10-CM | POA: Insufficient documentation

## 2016-01-01 DIAGNOSIS — Y92410 Unspecified street and highway as the place of occurrence of the external cause: Secondary | ICD-10-CM | POA: Diagnosis not present

## 2016-01-01 MED ORDER — IBUPROFEN 600 MG PO TABS: 600.0000 mg | ORAL_TABLET | Freq: Four times a day (QID) | ORAL | Status: DC | PRN

## 2016-01-01 MED ORDER — HYDROCODONE-ACETAMINOPHEN 5-325 MG PO TABS
1.0000 | ORAL_TABLET | Freq: Once | ORAL | Status: AC
Start: 2016-01-01 — End: 2016-01-01
  Administered 2016-01-01: 1 via ORAL
  Filled 2016-01-01: qty 1
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
## 2016-09-07 IMAGING — US US PELVIS COMPLETE
1 series · 13 of 25 positions shown · non-contrast
Comparison: No recent similar comparison exam

CLINICAL DATA: Pelvic pain bilateral

EXAM:
TRANSABDOMINAL AND TRANSVAGINAL ULTRASOUND OF PELVIS
TECHNIQUE: Both transabdominal and transvaginal ultrasound examinations of the
pelvis were performed. Transabdominal technique was performed for
global imaging of the pelvis including uterus, ovaries, adnexal
regions, and pelvic cul-de-sac. It was necessary to proceed with
endovaginal exam following the transabdominal exam to visualize the
endometrium and ovaries.

[Series 1: us pelvis complete · 13 of 59 slices shown]
[im 1/59]
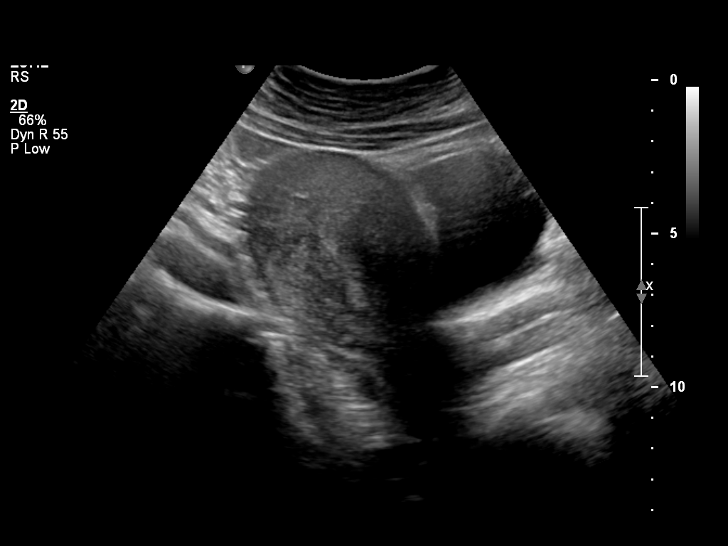
[im 5/59]
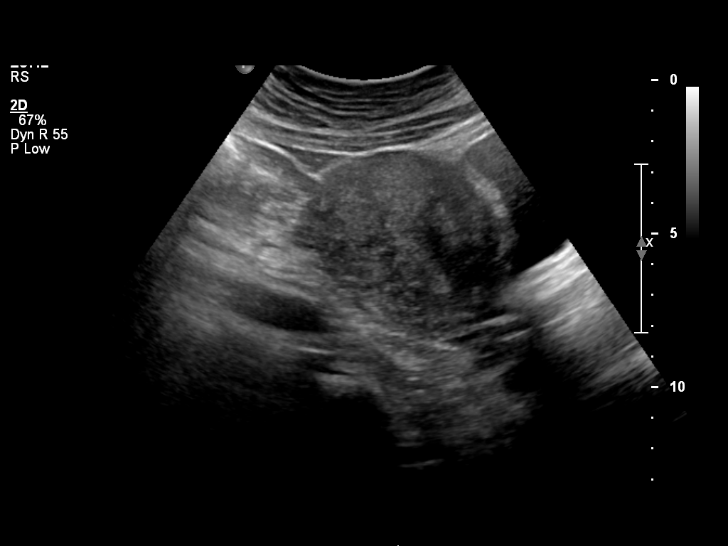
[im 10/59]
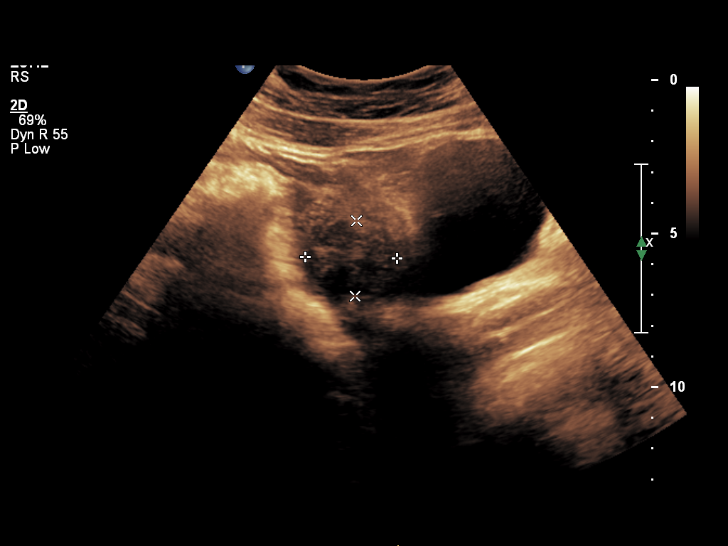
[im 15/59]
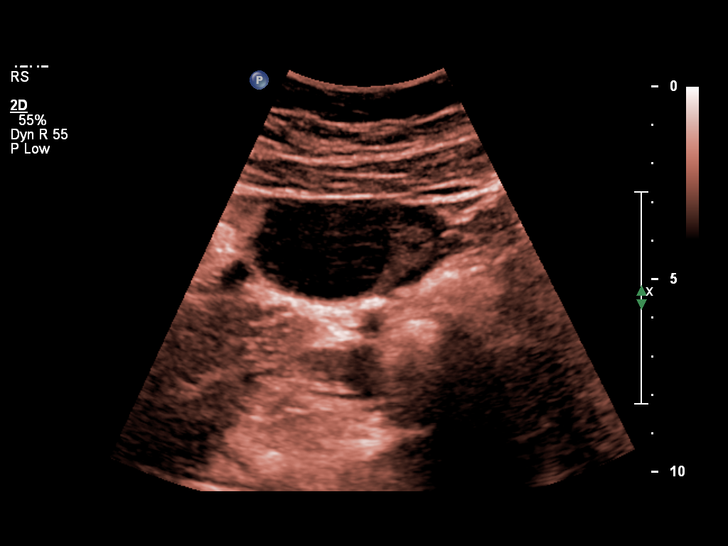
[im 20/59]
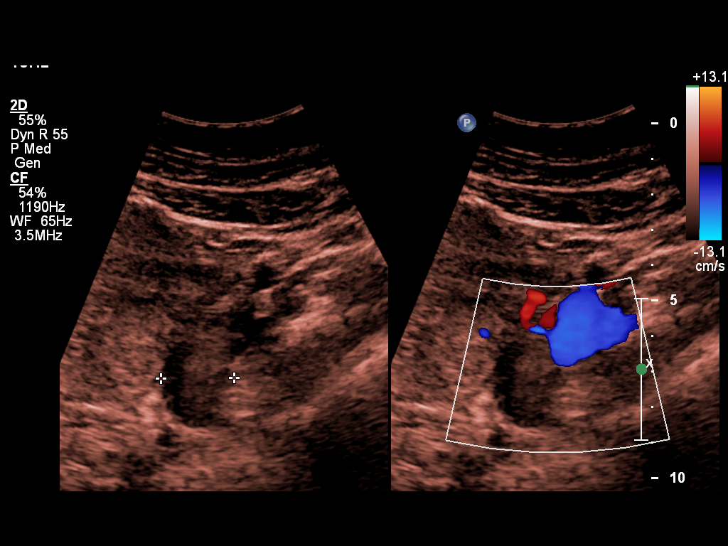
[im 25/59]
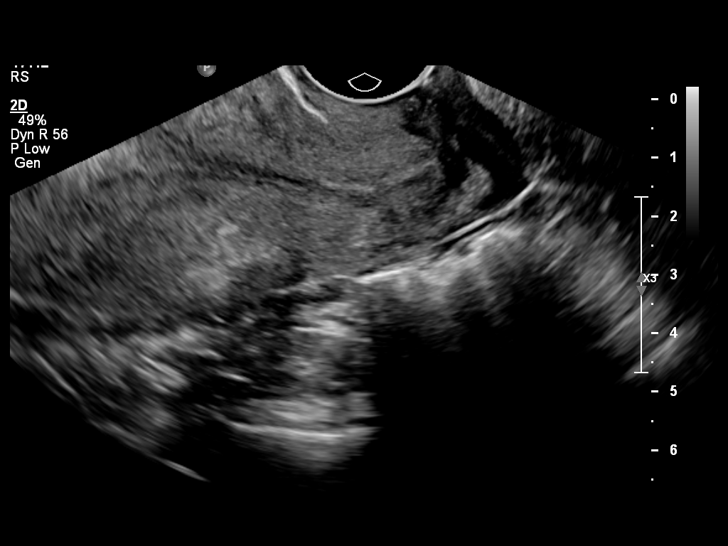
[im 30/59]
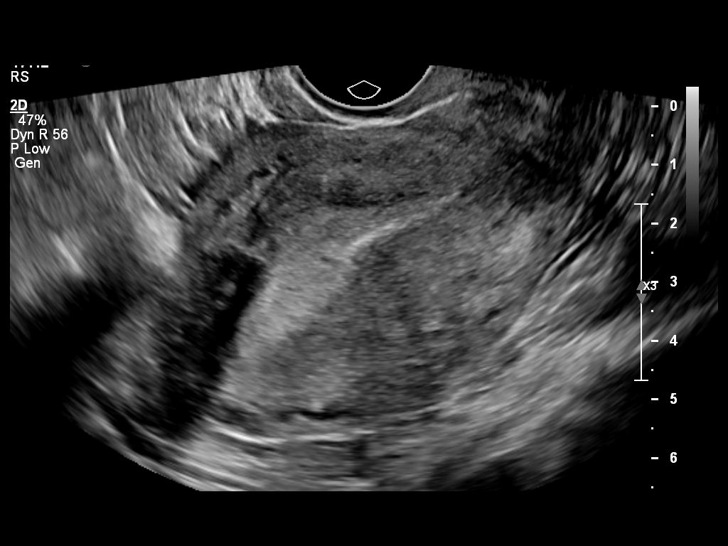
[im 34/59]
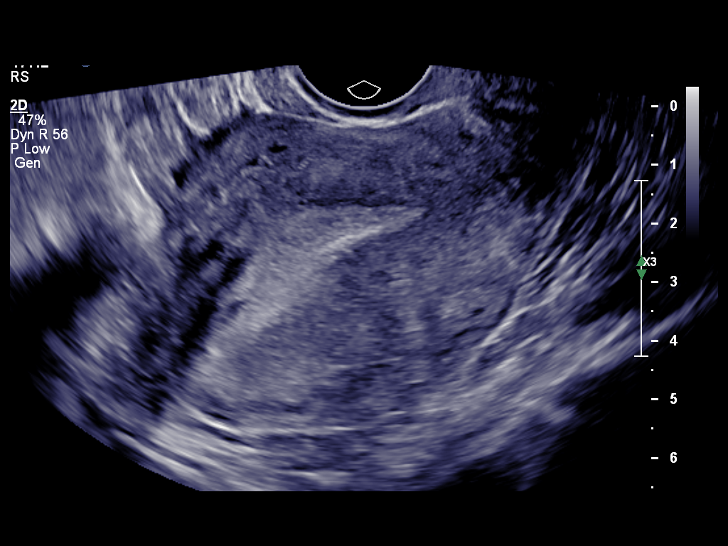
[im 39/59]
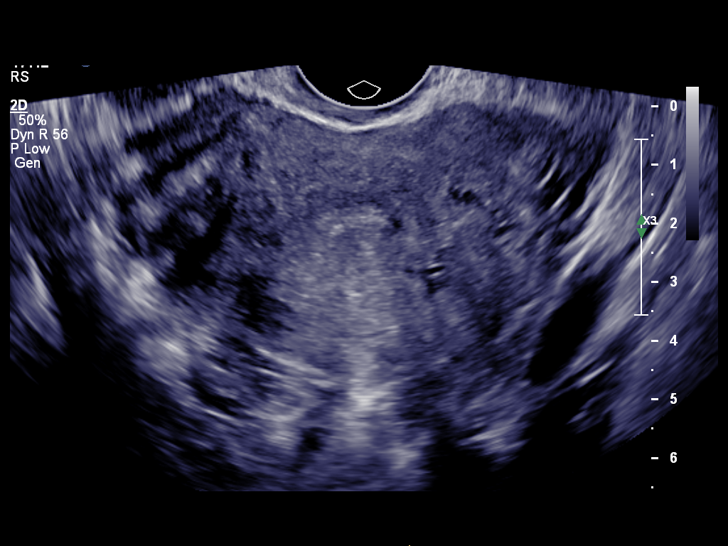
[im 44/59]
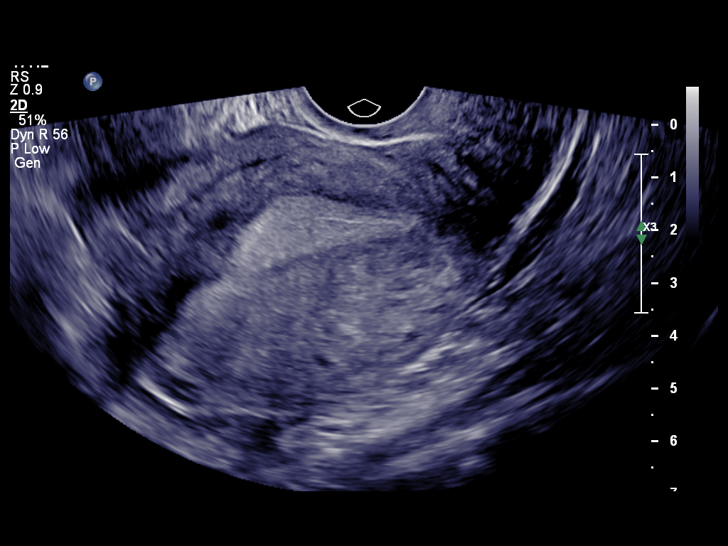
[im 49/59]
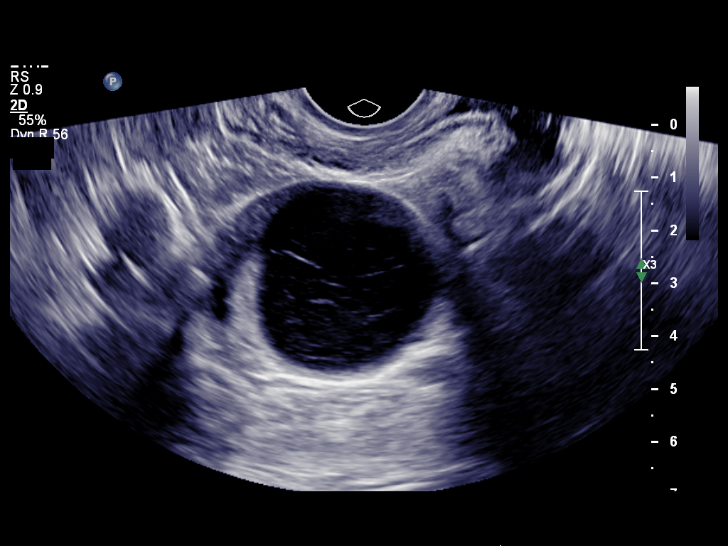
[im 54/59]
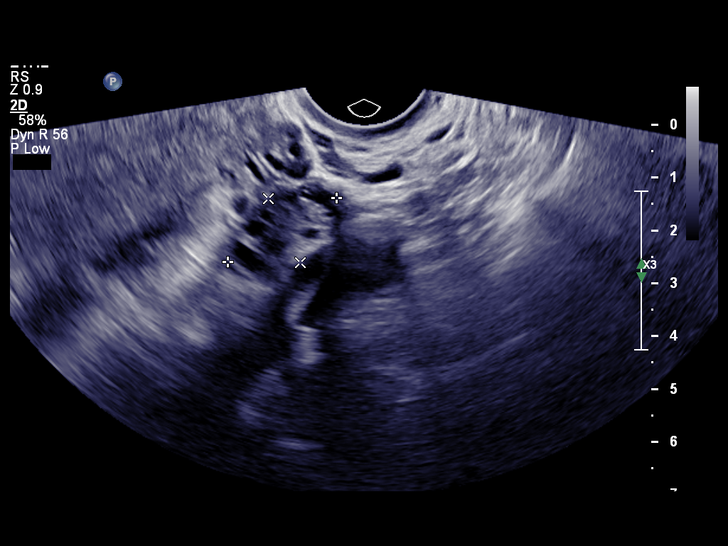
[im 59/59]
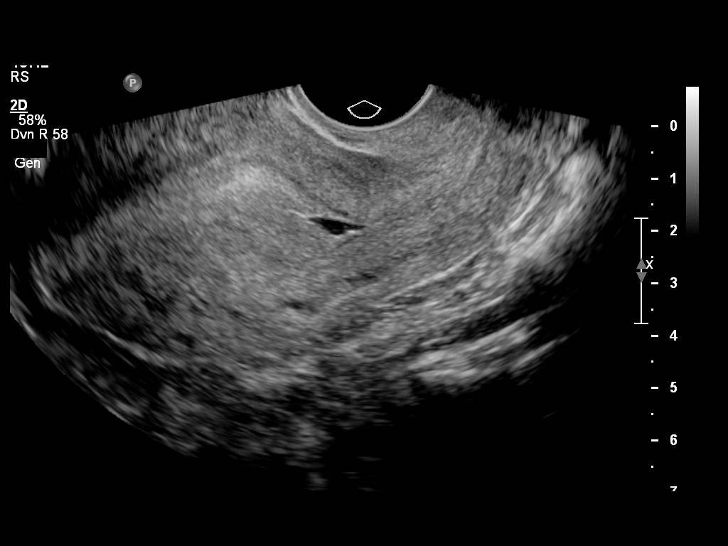

[13 of 25 positions shown; findings below may reference images not displayed]

FINDINGS: Uterus

Measurements: 9.8 x 7.8 x 5.6 cm. Right lateral uterine body
subserosal/ intramural fibroid, 3.1 x 3.0 x 2.7 cm. Anterior uterine
fundal intramural fibroid measures 1.3 x 0.8 x 0.7 cm.

Endometrium

Thickness: 1.4 cm, with borderline trilaminar appearance at its mid
portion and trace fluid within the fundal endometrial canal.

Right ovary

Measurements: 4.8 x 4.2 x 3.2 cm. Normal appearance/no adnexal mass.

Left ovary

Measurements: 2.4 x 2.0 x 1.4 cm. Normal appearance/no adnexal mass.

Other findings

Trace fluid is noted in the cul de sac.
IMPRESSION: Apparent focal endometrial prominence at its mid aspect which could
indicate timing during the third week of the patient's menstrual
cycle and physiologic endometrial hypertrophy during this time,
although a focal underlying lesion such as polyp or submucosal
fibroid or hyperplasia could be obscured. Followup imaging is
recommended in 4-6 weeks during the week immediately following the
patient's menses for further evaluation.

## 2016-10-12 ENCOUNTER — Other Ambulatory Visit: Payer: Self-pay | Admitting: Family Medicine

## 2016-10-12 DIAGNOSIS — N920 Excessive and frequent menstruation with regular cycle: Secondary | ICD-10-CM

## 2016-10-21 ENCOUNTER — Ambulatory Visit
Admission: RE | Admit: 2016-10-21 | Discharge: 2016-10-21 | Disposition: A | Discharge status: Home / Self Care | Payer: Medicaid Other | Source: Ambulatory Visit | Attending: Family Medicine | Admitting: Family Medicine

## 2016-10-21 DIAGNOSIS — N920 Excessive and frequent menstruation with regular cycle: Secondary | ICD-10-CM

## 2016-12-02 ENCOUNTER — Ambulatory Visit (INDEPENDENT_AMBULATORY_CARE_PROVIDER_SITE_OTHER): Payer: Medicaid Other | Admitting: Obstetrics and Gynecology

## 2016-12-02 ENCOUNTER — Other Ambulatory Visit (HOSPITAL_COMMUNITY)
Admission: RE | Admit: 2016-12-02 | Discharge: 2016-12-02 | Disposition: A | Discharge status: Home / Self Care | Payer: Medicaid Other | Source: Ambulatory Visit | Attending: Obstetrics and Gynecology | Admitting: Obstetrics and Gynecology

## 2016-12-02 ENCOUNTER — Encounter: Payer: Self-pay | Admitting: Obstetrics and Gynecology

## 2016-12-02 VITALS — BP 122/87 | HR 118 | Ht 62.0 in | Wt 214.0 lb

## 2016-12-02 DIAGNOSIS — N938 Other specified abnormal uterine and vaginal bleeding: Secondary | ICD-10-CM

## 2016-12-02 NOTE — Progress Notes (Signed)
36 yo P2 presenting today for the evaluation of an episode of DUB in November. She reports a monthly 7-day period but in November she bled for 14 days. Her flow was heavy with passage of clots which is unusual for her. She reports a normal 7-day period in December with the presence of a vaginal odor. She denies any pelvic pain or abnormal discharge. She is sexually active using BTL for contraception  Past Medical History:  Diagnosis Date  . Abnormal Pap smear   . Bacterial vaginosis   . Chlamydia trachomatis infection 2012   Past Surgical History:  Procedure Laterality Date  . GYNECOLOGIC CRYOSURGERY    . I&D EXTREMITY Right 04/04/2015   Procedure: IRRIGATION AND DEBRIDEMENT RIGHT WRIST;  Surgeon: Leanora Cover, MD;  Location: [place];  Service: Orthopedics;  Laterality: Right;  . TUBAL LIGATION    . WISDOM TOOTH EXTRACTION    . WRIST SURGERY     Family History  Problem Relation Age of Onset  . Heart attack Father   . Hypertension Mother   . Diabetes Maternal Grandmother    Social History  Substance Use Topics  . Smoking status: Former Research scientist (life sciences)  . Smokeless tobacco: Never Used  . Alcohol use Yes   ROS See pertinent in HPI  Blood pressure 122/87, pulse (!) 118, height 5\' 2"  (1.575 m), weight 214 lb (97.1 kg), last menstrual period 12/34/2017. GENERAL: Well-developed, well-nourished female in no acute distress.  ABDOMEN: Soft, nontender, nondistended. No organomegaly. PELVIC: Normal external female genitalia. Vagina is pink and rugated.  Normal discharge. Normal appearing cervix. Uterus is normal in size. No adnexal mass or tenderness. EXTREMITIES: No cyanosis, clubbing, or edema, 2+ distal pulses.   09/2016 FINDINGS: Uterus  Measurements: 10.7 x 4.9 x 7.2 cm. A 3.6 cm fibroid noted posteriorly in the fundus. A 1.0 cm fibroid anteriorly in the fundus.  Endometrium  Thickness: 8.6 mm.  No focal abnormality visualized.  Right ovary  Measurements: 3.2 x 3.1 x 2.5. 3.6 x  3.3 x 3.1 cm simple cyst left ovary.  Left ovary  Not visualized.  Other findings  No abnormal free fluid.  IMPRESSION: 1.  3.6 cm 1.0 cm uterine fibroids.  2. 3.6 cm simple cyst left ovary . This is almost certainly benign, and no specific imaging follow up is recommended according to the Society of Radiologists in [place] Statement (D Clovis Riley et al. Management of Asymptomatic Ovarian and Other Adnexal Cysts Imaged at [place]: Society of Radiologists in [place] Statement 2010. Radiology 256 (Sept 2010): [id number].).   Electronically Signed   By: Marcello Moores  Register   On: 10/21/2016 16:15  A/P 36 yo with an episode of DUB in November Reviewed ultrasound findings with the patient which is minimally changed from 2015 Advised to keep a menstrual calendar for recurrence of DUB Wet prep collected Patient will be contacted with any abnormal results Patient had a normal pap smear in 10/2016 Patient desires follow up ultrasound to confirm resolution of cyst

## 2016-12-03 LAB — CERVICOVAGINAL ANCILLARY ONLY
Bacterial vaginitis: POSITIVE — AB
CANDIDA VAGINITIS: NEGATIVE
Trichomonas: NEGATIVE

## 2016-12-05 ENCOUNTER — Other Ambulatory Visit: Payer: Self-pay | Admitting: Obstetrics and Gynecology

## 2016-12-05 MED ORDER — TINIDAZOLE 500 MG PO TABS: 1.0000 g | ORAL_TABLET | Freq: Every day | ORAL | 0 refills | Status: DC

## 2016-12-07 ENCOUNTER — Telehealth: Payer: Self-pay

## 2016-12-07 NOTE — Telephone Encounter (Signed)
Pt aware of test results. Pt aware d/t allergies, Tindamax was sent to [facility] on [address].

## 2016-12-07 NOTE — Telephone Encounter (Signed)
-----   Message from Mora Bellman, MD sent at 12/05/2016  8:10 AM EST ----- Please inform patient of BV infection. Patient with reported allergy to clindamycin and flagyl, thus alternate medication was provided.  Thanks  [employer]

## 2016-12-09 ENCOUNTER — Encounter: Payer: Self-pay | Admitting: Obstetrics and Gynecology

## 2016-12-15 ENCOUNTER — Ambulatory Visit (INDEPENDENT_AMBULATORY_CARE_PROVIDER_SITE_OTHER): Payer: Medicaid Other

## 2016-12-15 DIAGNOSIS — N938 Other specified abnormal uterine and vaginal bleeding: Secondary | ICD-10-CM

## 2016-12-25 ENCOUNTER — Telehealth: Payer: Self-pay | Admitting: *Deleted

## 2016-12-25 NOTE — Telephone Encounter (Signed)
-----   Message from Mora Bellman, MD sent at 12/15/2016  4:40 PM EST ----- Please inform patient of complete resolution of ovarian cyst  Thanks  Vickii Chafe

## 2016-12-25 NOTE — Telephone Encounter (Signed)
Call to patient- results reviewed and cyst resolved.

## 2016-12-31 ENCOUNTER — Encounter: Payer: Self-pay | Admitting: Obstetrics and Gynecology

## 2017-01-01 ENCOUNTER — Other Ambulatory Visit: Payer: Self-pay

## 2017-01-01 MED ORDER — FLUCONAZOLE 150 MG PO TABS: 150.0000 mg | ORAL_TABLET | Freq: Once | ORAL | 0 refills | Status: DC

## 2017-01-01 NOTE — Progress Notes (Signed)
Pt with recent Abx use c/o outer vaginal irritation. Recommended OTC Monistat or Terazol but pt said she prefers pills. Diflucan sent per protocol.

## 2017-01-19 ENCOUNTER — Encounter (HOSPITAL_BASED_OUTPATIENT_CLINIC_OR_DEPARTMENT_OTHER)
Admission: RE | Admit: 2017-01-19 | Discharge: 2017-01-19 | Disposition: A | Discharge status: Home / Self Care | Payer: Medicaid Other | Source: Ambulatory Visit | Attending: Otolaryngology | Admitting: Otolaryngology

## 2017-01-19 ENCOUNTER — Encounter (HOSPITAL_BASED_OUTPATIENT_CLINIC_OR_DEPARTMENT_OTHER): Payer: Self-pay | Admitting: *Deleted

## 2017-01-19 DIAGNOSIS — Z01810 Encounter for preprocedural cardiovascular examination: Secondary | ICD-10-CM | POA: Diagnosis not present

## 2017-01-19 DIAGNOSIS — I10 Essential (primary) hypertension: Secondary | ICD-10-CM | POA: Insufficient documentation

## 2017-01-19 LAB — BASIC METABOLIC PANEL
ANION GAP: 10 (ref 5–15)
BUN: 12 mg/dL (ref 6–20)
CALCIUM: 9.2 mg/dL (ref 8.9–10.3)
CHLORIDE: 99 mmol/L — AB (ref 101–111)
CO2: 28 mmol/L (ref 22–32)
CREATININE: 0.95 mg/dL (ref 0.44–1.00)
GFR calc non Af Amer: >60 mL/min (ref >60)
Glucose, Bld: 107 mg/dL — ABNORMAL HIGH (ref 65–99)
Potassium: 3.8 mmol/L (ref 3.5–5.1)
Sodium: 137 mmol/L (ref 135–145)

## 2017-01-22 ENCOUNTER — Other Ambulatory Visit: Payer: Self-pay | Admitting: Otolaryngology

## 2017-01-25 ENCOUNTER — Encounter (HOSPITAL_BASED_OUTPATIENT_CLINIC_OR_DEPARTMENT_OTHER)
Admission: RE | Disposition: A | Discharge status: Home / Self Care | Payer: Self-pay | Source: Ambulatory Visit | Attending: Otolaryngology

## 2017-01-25 ENCOUNTER — Ambulatory Visit (HOSPITAL_BASED_OUTPATIENT_CLINIC_OR_DEPARTMENT_OTHER)
Admission: RE | Admit: 2017-01-25 | Discharge: 2017-01-25 | Disposition: A | Discharge status: Home / Self Care | Payer: Medicaid Other | Source: Ambulatory Visit | Attending: Otolaryngology | Admitting: Otolaryngology

## 2017-01-25 ENCOUNTER — Encounter (HOSPITAL_BASED_OUTPATIENT_CLINIC_OR_DEPARTMENT_OTHER): Payer: Self-pay | Admitting: Anesthesiology

## 2017-01-25 ENCOUNTER — Ambulatory Visit (HOSPITAL_BASED_OUTPATIENT_CLINIC_OR_DEPARTMENT_OTHER): Payer: Medicaid Other | Admitting: Anesthesiology

## 2017-01-25 DIAGNOSIS — Z9889 Other specified postprocedural states: Secondary | ICD-10-CM | POA: Diagnosis not present

## 2017-01-25 DIAGNOSIS — Z79899 Other long term (current) drug therapy: Secondary | ICD-10-CM | POA: Insufficient documentation

## 2017-01-25 DIAGNOSIS — Z833 Family history of diabetes mellitus: Secondary | ICD-10-CM | POA: Insufficient documentation

## 2017-01-25 DIAGNOSIS — J338 Other polyp of sinus: Secondary | ICD-10-CM | POA: Diagnosis not present

## 2017-01-25 DIAGNOSIS — Z7951 Long term (current) use of inhaled steroids: Secondary | ICD-10-CM | POA: Insufficient documentation

## 2017-01-25 DIAGNOSIS — Z881 Allergy status to other antibiotic agents status: Secondary | ICD-10-CM | POA: Insufficient documentation

## 2017-01-25 DIAGNOSIS — Z8249 Family history of ischemic heart disease and other diseases of the circulatory system: Secondary | ICD-10-CM | POA: Diagnosis not present

## 2017-01-25 DIAGNOSIS — J329 Chronic sinusitis, unspecified: Secondary | ICD-10-CM | POA: Diagnosis not present

## 2017-01-25 DIAGNOSIS — Z883 Allergy status to other anti-infective agents status: Secondary | ICD-10-CM | POA: Insufficient documentation

## 2017-01-25 DIAGNOSIS — J45909 Unspecified asthma, uncomplicated: Secondary | ICD-10-CM | POA: Diagnosis not present

## 2017-01-25 DIAGNOSIS — Z87891 Personal history of nicotine dependence: Secondary | ICD-10-CM | POA: Diagnosis not present

## 2017-01-25 DIAGNOSIS — I10 Essential (primary) hypertension: Secondary | ICD-10-CM | POA: Insufficient documentation

## 2017-01-25 DIAGNOSIS — Z8489 Family history of other specified conditions: Secondary | ICD-10-CM | POA: Insufficient documentation

## 2017-01-25 HISTORY — PX: SINUS ENDO WITH FUSION: SHX5329

## 2017-01-25 HISTORY — DX: Family history of other specified conditions: Z84.89

## 2017-01-25 SURGERY — SURGERY, PARANASAL SINUS, ENDOSCOPIC, WITH NASAL SEPTOPLASTY, TURBINOPLASTY, AND MAXILLARY SINUSOTOMY
Anesthesia: General | Site: Nose

## 2017-01-25 MED ORDER — DEXAMETHASONE SODIUM PHOSPHATE 4 MG/ML IJ SOLN
INTRAMUSCULAR | Status: DC | PRN
  Administered 2017-01-25: 10 mg via INTRAVENOUS

## 2017-01-25 MED ORDER — CHLORHEXIDINE GLUCONATE CLOTH 2 % EX PADS: 6.0000 | MEDICATED_PAD | Freq: Once | CUTANEOUS | Status: DC

## 2017-01-25 MED ORDER — MUPIROCIN 2 % EX OINT
TOPICAL_OINTMENT | CUTANEOUS | Status: AC
  Filled 2017-01-25: qty 22

## 2017-01-25 MED ORDER — HYDROCODONE-ACETAMINOPHEN 5-325 MG PO TABS: 1.0000 | ORAL_TABLET | Freq: Four times a day (QID) | ORAL | 0 refills | Status: DC | PRN

## 2017-01-25 MED ORDER — LIDOCAINE-EPINEPHRINE 2 %-1:100000 IJ SOLN
INTRAMUSCULAR | Status: DC | PRN
  Administered 2017-01-25: 4.5 mL

## 2017-01-25 MED ORDER — PROPOFOL 10 MG/ML IV BOLUS
INTRAVENOUS | Status: AC
  Filled 2017-01-25: qty 20

## 2017-01-25 MED ORDER — OXYMETAZOLINE HCL 0.05 % NA SOLN
NASAL | Status: AC
  Filled 2017-01-25: qty 15

## 2017-01-25 MED ORDER — LIDOCAINE 2% (20 MG/ML) 5 ML SYRINGE
INTRAMUSCULAR | Status: AC
  Filled 2017-01-25: qty 5

## 2017-01-25 MED ORDER — FENTANYL CITRATE (PF) 100 MCG/2ML IJ SOLN
50.0000 ug | INTRAMUSCULAR | Status: DC | PRN
  Administered 2017-01-25: 100 ug via INTRAVENOUS

## 2017-01-25 MED ORDER — AMOXICILLIN-POT CLAVULANATE 875-125 MG PO TABS: 1.0000 | ORAL_TABLET | Freq: Two times a day (BID) | ORAL | 0 refills | Status: DC

## 2017-01-25 MED ORDER — FENTANYL CITRATE (PF) 100 MCG/2ML IJ SOLN
INTRAMUSCULAR | Status: AC
  Filled 2017-01-25: qty 2

## 2017-01-25 MED ORDER — ARTIFICIAL TEARS OP OINT
TOPICAL_OINTMENT | OPHTHALMIC | Status: AC
  Filled 2017-01-25: qty 3.5

## 2017-01-25 MED ORDER — METOCLOPRAMIDE HCL 5 MG/ML IJ SOLN: 10.0000 mg | Freq: Once | INTRAMUSCULAR | Status: DC | PRN

## 2017-01-25 MED ORDER — MIDAZOLAM HCL 2 MG/2ML IJ SOLN
1.0000 mg | INTRAMUSCULAR | Status: DC | PRN
  Administered 2017-01-25: 2 mg via INTRAVENOUS

## 2017-01-25 MED ORDER — SUCCINYLCHOLINE CHLORIDE 20 MG/ML IJ SOLN
INTRAMUSCULAR | Status: DC | PRN
  Administered 2017-01-25: 100 mg via INTRAVENOUS

## 2017-01-25 MED ORDER — OXYMETAZOLINE HCL 0.05 % NA SOLN
NASAL | Status: DC | PRN
  Administered 2017-01-25: 1 via TOPICAL

## 2017-01-25 MED ORDER — ONDANSETRON HCL 4 MG/2ML IJ SOLN
INTRAMUSCULAR | Status: AC
  Filled 2017-01-25: qty 2

## 2017-01-25 MED ORDER — AMPICILLIN-SULBACTAM SODIUM 3 (2-1) G IJ SOLR
INTRAMUSCULAR | Status: AC
  Filled 2017-01-25: qty 3

## 2017-01-25 MED ORDER — FENTANYL CITRATE (PF) 100 MCG/2ML IJ SOLN
INTRAMUSCULAR | Status: AC
Start: 2017-01-25 — End: 2017-01-25
  Filled 2017-01-25: qty 2

## 2017-01-25 MED ORDER — DEXAMETHASONE SODIUM PHOSPHATE 10 MG/ML IJ SOLN
INTRAMUSCULAR | Status: AC
  Filled 2017-01-25: qty 1

## 2017-01-25 MED ORDER — LACTATED RINGERS IV SOLN
INTRAVENOUS | Status: DC
  Administered 2017-01-25 (×2): via INTRAVENOUS

## 2017-01-25 MED ORDER — MIDAZOLAM HCL 2 MG/2ML IJ SOLN
INTRAMUSCULAR | Status: AC
  Filled 2017-01-25: qty 2

## 2017-01-25 MED ORDER — DEXAMETHASONE SODIUM PHOSPHATE 10 MG/ML IJ SOLN
INTRAMUSCULAR | Status: DC | PRN
  Administered 2017-01-25: 10 mg

## 2017-01-25 MED ORDER — ONDANSETRON HCL 4 MG/2ML IJ SOLN
INTRAMUSCULAR | Status: DC | PRN
  Administered 2017-01-25: 4 mg via INTRAVENOUS

## 2017-01-25 MED ORDER — PROPOFOL 10 MG/ML IV BOLUS
INTRAVENOUS | Status: DC | PRN
  Administered 2017-01-25: 200 mg via INTRAVENOUS

## 2017-01-25 MED ORDER — MEPERIDINE HCL 25 MG/ML IJ SOLN: 6.2500 mg | INTRAMUSCULAR | Status: DC | PRN

## 2017-01-25 MED ORDER — SODIUM CHLORIDE 0.9 % IV SOLN
INTRAVENOUS | Status: DC | PRN
  Administered 2017-01-25: 3 g via INTRAVENOUS

## 2017-01-25 MED ORDER — LIDOCAINE-EPINEPHRINE 2 %-1:100000 IJ SOLN
INTRAMUSCULAR | Status: AC
  Filled 2017-01-25: qty 5.1

## 2017-01-25 MED ORDER — LACTATED RINGERS IV SOLN: INTRAVENOUS | Status: DC

## 2017-01-25 MED ORDER — SUCCINYLCHOLINE CHLORIDE 200 MG/10ML IV SOSY
PREFILLED_SYRINGE | INTRAVENOUS | Status: AC
  Filled 2017-01-25: qty 10

## 2017-01-25 MED ORDER — FENTANYL CITRATE (PF) 100 MCG/2ML IJ SOLN
25.0000 ug | INTRAMUSCULAR | Status: DC | PRN
  Administered 2017-01-25: 50 ug via INTRAVENOUS
  Administered 2017-01-25: 25 ug via INTRAVENOUS

## 2017-01-25 MED ORDER — LIDOCAINE HCL (CARDIAC) 20 MG/ML IV SOLN
INTRAVENOUS | Status: DC | PRN
  Administered 2017-01-25: 100 mg via INTRAVENOUS

## 2017-01-25 MED ORDER — SCOPOLAMINE 1 MG/3DAYS TD PT72: 1.0000 | MEDICATED_PATCH | Freq: Once | TRANSDERMAL | Status: DC | PRN

## 2017-01-25 MED ORDER — PHENYLEPHRINE HCL 10 MG/ML IJ SOLN
INTRAMUSCULAR | Status: DC | PRN
  Administered 2017-01-25 (×3): 80 ug via INTRAVENOUS

## 2017-01-25 SURGICAL SUPPLY — 60 items
BLADE RAD40 ROTATE 4M 4 5PK (BLADE) IMPLANT
BLADE RAD60 ROTATE M4 4 5PK (BLADE) ×1 IMPLANT
BLADE ROTATE RAD 12 4 M4 (BLADE) IMPLANT
BLADE ROTATE RAD 40 4 M4 (BLADE) IMPLANT
BLADE ROTATE TRICUT 4X13 M4 (BLADE) ×2 IMPLANT
BLADE TRICUT ROTATE M4 4 5PK (BLADE) IMPLANT
BUR HS RAD FRONTAL 3 (BURR) IMPLANT
CANISTER SUC SOCK COL 7IN (MISCELLANEOUS) ×2 IMPLANT
CANISTER SUCT 1200ML W/VALVE (MISCELLANEOUS) ×4 IMPLANT
COAGULATOR SUCT SWTCH 10FR 6 (ELECTROSURGICAL) IMPLANT
CORDS BIPOLAR (ELECTRODE) IMPLANT
DECANTER SPIKE VIAL GLASS SM (MISCELLANEOUS) IMPLANT
DRESSING ADAPTIC 1/2  N-ADH (PACKING) IMPLANT
DRESSING NASAL KENNEDY 3.5X.9 (MISCELLANEOUS) IMPLANT
DRSG NASAL KENNEDY 3.5X.9 (MISCELLANEOUS)
DRSG NASAL KENNEDY LMNT 8CM (GAUZE/BANDAGES/DRESSINGS) IMPLANT
DRSG NASOPORE 8CM (GAUZE/BANDAGES/DRESSINGS) IMPLANT
DRSG TELFA 3X8 NADH (GAUZE/BANDAGES/DRESSINGS) IMPLANT
ELECT COATED BLADE 2.86 ST (ELECTRODE) IMPLANT
ELECT REM PT RETURN 9FT ADLT (ELECTROSURGICAL) ×2
ELECTRODE REM PT RTRN 9FT ADLT (ELECTROSURGICAL) IMPLANT
GAUZE SPONGE 4X4 16PLY XRAY LF (GAUZE/BANDAGES/DRESSINGS) IMPLANT
GAUZE VASELINE FOILPK 1/2 X 72 (GAUZE/BANDAGES/DRESSINGS) IMPLANT
GLOVE BIO SURGEON STRL SZ7.5 (GLOVE) ×2 IMPLANT
GLOVE EXAM NITRILE EXT CUFF MD (GLOVE) ×1 IMPLANT
GLOVE SURG SS PI 7.0 STRL IVOR (GLOVE) ×1 IMPLANT
GOWN STRL REUS W/ TWL LRG LVL3 (GOWN DISPOSABLE) ×2 IMPLANT
GOWN STRL REUS W/TWL LRG LVL3 (GOWN DISPOSABLE) ×4
HEMOSTAT SURGICEL .5X2 ABSORB (HEMOSTASIS) IMPLANT
IMPL PROPEL SINUS 23MML (Prosthesis and Implant ENT) IMPLANT
IMPLANT PROPEL SINUS 23MML (Prosthesis and Implant ENT) ×4 IMPLANT
IV NS 1000ML (IV SOLUTION)
IV NS 1000ML BAXH (IV SOLUTION) IMPLANT
IV NS 500ML (IV SOLUTION) ×2
IV NS 500ML BAXH (IV SOLUTION) IMPLANT
NDL PRECISIONGLIDE 27X1.5 (NEEDLE) ×1 IMPLANT
NDL SPNL 25GX3.5 QUINCKE BL (NEEDLE) IMPLANT
NEEDLE PRECISIONGLIDE 27X1.5 (NEEDLE) ×2 IMPLANT
NEEDLE SPNL 25GX3.5 QUINCKE BL (NEEDLE) ×2 IMPLANT
NS IRRIG 1000ML POUR BTL (IV SOLUTION) ×1 IMPLANT
PACK BASIN DAY SURGERY FS (CUSTOM PROCEDURE TRAY) ×2 IMPLANT
PACK ENT DAY SURGERY (CUSTOM PROCEDURE TRAY) ×2 IMPLANT
PAD DRESSING TELFA 3X8 NADH (GAUZE/BANDAGES/DRESSINGS) IMPLANT
PATTIES SURGICAL .5 X3 (DISPOSABLE) ×2 IMPLANT
PENCIL BUTTON HOLSTER BLD 10FT (ELECTRODE) IMPLANT
Propel Contour Mometasone Furoate 370 mcg Sinus Im ×2 IMPLANT
SLEEVE SCD COMPRESS KNEE MED (MISCELLANEOUS) ×1 IMPLANT
SOLUTION ANTI FOG 6CC (MISCELLANEOUS) ×2 IMPLANT
SOLUTION BUTLER CLEAR DIP (MISCELLANEOUS) ×2 IMPLANT
SPONGE GAUZE 2X2 8PLY STRL LF (GAUZE/BANDAGES/DRESSINGS) ×2 IMPLANT
SUT CHROMIC 2 0 SH (SUTURE) IMPLANT
SUT ETHILON 3 0 PS 1 (SUTURE) IMPLANT
TOWEL OR 17X24 6PK STRL BLUE (TOWEL DISPOSABLE) ×3 IMPLANT
TRACKER ENT INSTRUMENT (MISCELLANEOUS) ×2 IMPLANT
TRACKER ENT PATIENT (MISCELLANEOUS) ×2 IMPLANT
TRAY DSU PREP LF (CUSTOM PROCEDURE TRAY) ×2 IMPLANT
TUBE CONNECTING 20X1/4 (TUBING) ×2 IMPLANT
TUBE SALEM SUMP 16 FR W/ARV (TUBING) IMPLANT
TUBING STRAIGHTSHOT EPS 5PK (TUBING) ×2 IMPLANT
YANKAUER SUCT BULB TIP NO VENT (SUCTIONS) ×2 IMPLANT

## 2017-01-25 NOTE — Anesthesia Procedure Notes (Signed)
Procedure Name: Intubation Date/Time: 01/25/2017 11:20 AM Performed by: Lieutenant Diego Pre-anesthesia Checklist: Patient identified, Emergency Drugs available, Suction available and Patient being monitored Patient Re-evaluated:Patient Re-evaluated prior to inductionOxygen Delivery Method: Circle system utilized Preoxygenation: Pre-oxygenation with 100% oxygen Intubation Type: IV induction Ventilation: Mask ventilation without difficulty Laryngoscope Size: Miller and 2 Grade View: Grade I Tube type: Oral Tube size: 7.0 mm Number of attempts: 1 Airway Equipment and Method: Stylet and Oral airway Placement Confirmation: ETT inserted through vocal cords under direct vision,  positive ETCO2 and breath sounds checked- equal and bilateral Secured at: 22 cm Tube secured with: Tape Dental Injury: Teeth and Oropharynx as per pre-operative assessment

## 2017-01-25 NOTE — Discharge Instructions (Signed)

## 2017-01-25 NOTE — Anesthesia Preprocedure Evaluation (Signed)
Anesthesia Evaluation  Patient identified by MRN, date of birth, ID band Patient awake    Reviewed: Allergy & Precautions, NPO status , Patient's Chart, lab work & pertinent test results  Airway Mallampati: II  TM Distance: >3 FB Neck ROM: Full    Dental no notable dental hx.    Pulmonary asthma , former smoker,    Pulmonary exam normal breath sounds clear to auscultation       Cardiovascular hypertension, Pt. on medications Normal cardiovascular exam Rhythm:Regular Rate:Normal     Neuro/Psych negative neurological ROS  negative psych ROS   GI/Hepatic negative GI ROS, Neg liver ROS, hiatal hernia,   Endo/Other  negative endocrine ROS  Renal/GU negative Renal ROS  negative genitourinary   Musculoskeletal negative musculoskeletal ROS (+)   Abdominal   Peds negative pediatric ROS (+)  Hematology negative hematology ROS (+)   Anesthesia Other Findings   Reproductive/Obstetrics negative OB ROS                             Anesthesia Physical Anesthesia Plan  ASA: II  Anesthesia Plan: General   Post-op Pain Management:    Induction: Intravenous  Airway Management Planned: Oral ETT  Additional Equipment:   Intra-op Plan:   Post-operative Plan: Extubation in OR  Informed Consent: I have reviewed the patients History and Physical, chart, labs and discussed the procedure including the risks, benefits and alternatives for the proposed anesthesia with the patient or authorized representative who has indicated his/her understanding and acceptance.   Dental advisory given  Plan Discussed with: CRNA  Anesthesia Plan Comments:         Anesthesia Quick Evaluation

## 2017-01-25 NOTE — H&P (Signed)
Lauren Garza is an 36 y.o. female.   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  She presents for surgical management.  Past Medical History:  Diagnosis Date  . Abnormal Pap smear   . Bacterial vaginosis   . Chlamydia trachomatis infection 2012  . Family history of adverse reaction to anesthesia    mom has n and V past surgery    Past Surgical History:  Procedure Laterality Date  . GYNECOLOGIC CRYOSURGERY    . I&D EXTREMITY Right 04/04/2015   Procedure: IRRIGATION AND DEBRIDEMENT RIGHT WRIST;  Surgeon: Leanora Cover, MD;  Location: [place];  Service: Orthopedics;  Laterality: Right;  . TUBAL LIGATION    . WISDOM TOOTH EXTRACTION    . WRIST SURGERY      Family History  Problem Relation Age of Onset  . Heart attack Father   . Hypertension Mother   . Diabetes Maternal Grandmother    Social History:  reports that she has quit smoking. She has never used smokeless tobacco. She reports that she drinks alcohol. She reports that she does not use drugs.  Allergies:  Allergies  Allergen Reactions  . Clindamycin/Lincomycin Hives  . Flagyl [Metronidazole Hcl] Hives    Medications Prior to Admission  Medication Sig Dispense Refill  . budesonide-formoterol (SYMBICORT) 160-4.5 MCG/ACT inhaler Inhale 2 puffs into the lungs 2 (two) times daily.    . promethazine (PHENERGAN) 12.5 MG tablet Take 1 tablet (12.5 mg total) by mouth every 6 (six) hours as needed for nausea or vomiting. 30 tablet 0  . triamterene-hydrochlorothiazide (DYAZIDE) 37.5-25 MG capsule Take 1 capsule by mouth daily.      No results found for this or any previous visit (from the past 48 hour(s)). No results found.  Review of Systems  HENT: Positive for congestion and sinus pain.   All other systems reviewed and are  negative.   Blood pressure 121/68, pulse 69, temperature 98 F (36.7 C), temperature source Oral, resp. rate 20, height 5\' 2"  (1.575 m), weight 215 lb 2 oz (97.6 kg), last menstrual period 01/06/2017, SpO2 98 %. Physical Exam  Constitutional: She is oriented to person, place, and time. She appears well-developed and well-nourished. No distress.  HENT:  Head: Normocephalic and atraumatic.  Right Ear: External ear normal.  Left Ear: External ear normal.  Mouth/Throat: Oropharynx is clear and moist.  Nasal congestion.  Eyes: Conjunctivae and EOM are normal. Pupils are equal, round, and reactive to light.  Neck: Normal range of motion. Neck supple.  Cardiovascular: Normal rate.   Respiratory: Effort normal.  Musculoskeletal: Normal range of motion.  Neurological: She is alert and oriented to person, place, and time. No cranial nerve deficit.  Skin: Skin is warm and dry.  Psychiatric: She has a normal mood and affect. Her behavior is normal. Judgment and thought content normal.     Assessment/Plan Chronic polypoid sinusitis To OR for endoscopic sinus surgery with Propel placement.  Melida Quitter, MD 01/25/2017, 11:03 AM

## 2017-01-25 NOTE — Brief Op Note (Signed)
01/25/2017  12:50 PM  PATIENT:  Lauren Garza  36 y.o. female  PRE-OPERATIVE DIAGNOSIS:  CHRONIC SINUSITIS  POST-OPERATIVE DIAGNOSIS:  CHRONIC SINUSITIS  PROCEDURE:  Procedure(s): SINUS ENDO WITH FUSION (N/A)  Bilateral total ethmoidectomy Bilateral sphenoidotomy with tissue removal Bilateral frontal recess exploration Bilateral maxillary antrostomy Fusion image guidance  SURGEON:  Surgeon(s) and Role:    * Melida Quitter, MD - Primary  PHYSICIAN ASSISTANT:   ASSISTANTS: none   ANESTHESIA:   general  EBL:  Total I/O In: 1300 [I.V.:1300] Out: 60 [Blood:60]  BLOOD ADMINISTERED:none  DRAINS: none   LOCAL MEDICATIONS USED:  LIDOCAINE   SPECIMEN:  Source of Specimen:  Sinus contents  DISPOSITION OF SPECIMEN:  PATHOLOGY  COUNTS:  YES  TOURNIQUET:  * No tourniquets in log *  DICTATION: .Other Dictation: Dictation Number [id number]  PLAN OF CARE: Discharge to home after PACU  PATIENT DISPOSITION:  PACU - hemodynamically stable.   Delay start of Pharmacological VTE agent (>24hrs) due to surgical blood loss or risk of bleeding: no

## 2017-01-25 NOTE — Anesthesia Postprocedure Evaluation (Signed)
Anesthesia Post Note  Patient: HEIRESS MATSUSHITA  Procedure(s) Performed: Procedure(s) (LRB): SINUS ENDO WITH FUSION (N/A)  Patient location during evaluation: PACU Anesthesia Type: General Level of consciousness: awake and alert Pain management: pain level controlled Vital Signs Assessment: post-procedure vital signs reviewed and stable Respiratory status: spontaneous breathing, nonlabored ventilation, respiratory function stable and patient connected to nasal cannula oxygen Cardiovascular status: blood pressure returned to baseline and stable Postop Assessment: no signs of nausea or vomiting Anesthetic complications: no       Last Vitals:  Vitals:   01/25/17 1355 01/25/17 1400  BP:    Pulse: 66 67  Resp: 15 14  Temp:      Last Pain:  Vitals:   01/25/17 1400  TempSrc:   PainSc: 5                  Montez Hageman

## 2017-01-25 NOTE — Transfer of Care (Signed)
Immediate Anesthesia Transfer of Care Note  Patient: Lauren Garza  Procedure(s) Performed: Procedure(s): SINUS ENDO WITH FUSION (N/A)  Patient Location: PACU  Anesthesia Type:General  Level of Consciousness: sedated  Airway & Oxygen Therapy: Patient Spontanous Breathing and Patient connected to face mask oxygen  Post-op Assessment: Report given to RN and Post -op Vital signs reviewed and stable  Post vital signs: Reviewed and stable  Last Vitals:  Vitals:   01/25/17 1307 01/25/17 1308  BP:  116/73  Pulse: 81 82  Resp:  20  Temp:      Last Pain:  Vitals:   01/25/17 0953  TempSrc: Oral  PainSc: 3          Complications: No apparent anesthesia complications

## 2017-01-26 ENCOUNTER — Encounter (HOSPITAL_BASED_OUTPATIENT_CLINIC_OR_DEPARTMENT_OTHER): Payer: Self-pay | Admitting: Otolaryngology

## 2017-01-26 NOTE — Op Note (Signed)
NAMENACHELLE, APPLEBAUM            ACCOUNT NO.:  [id number]  MEDICAL RECORD NO.:  [id number]  LOCATION:                                 FACILITY:  PHYSICIAN:  Onnie Graham, MD     DATE OF BIRTH:  [birth year]  DATE OF PROCEDURE:  01/25/2017 DATE OF DISCHARGE:                              OPERATIVE REPORT   PREOPERATIVE DIAGNOSIS:  Chronic polypoid pansinusitis.  POSTOPERATIVE DIAGNOSIS:  Chronic polypoid pansinusitis.  PROCEDURES: 1. Bilateral total ethmoidectomy. 2. Bilateral sphenoidotomy with tissue removal. 3. Bilateral frontal recess exploration. 4. Bilateral maxillary antrostomy. 5. Fusion image guidance.  SURGEON:  Onnie Graham, MD.  ANESTHESIA:  General endotracheal anesthesia.  COMPLICATIONS:  None.  INDICATION:  The patient is a 36 year old female with chronic polypoid sinusitis affecting all of her sinuses, who underwent endoscopic sinus surgery in November 2016 and had some benefit, but has had difficult control symptoms with topical medications.  She refuses oral steroids and has had a recurrence of symptoms in the last several months including obstructive breathing, nasal drainage, and facial pain.  She presents to the operating room for surgical management.  FINDINGS:  There were marked polypoid changes involving both ethmoid cavities obstructing her other sinuses.  The left maxillary sinus was full of mucopus.  The right maxillary sinus was healthy appearing with polypoid changes around the ostium.  Both frontal sinuses contained thick tenacious mucus as did the ethmoid sinuses.  Both sphenoid sinuses had polypoid changes within them.  At the end of surgery, ProPell implants and Stammberger packs were placed.  DESCRIPTION OF PROCEDURE:  The patient was identified in the holding room.  Informed consent having been obtained.  Discussion of risks, benefits, and alternatives.  The patient was brought to the operative suite, put on the operating table in  supine position.  Anesthesia was induced and the patient was intubated by anesthesia team without difficulty.  The patient was given intravenous antibiotics and steroids during the case.  The eyes taped closed and the fusion antenna was placed on the forehead.  The midface was prepped and draped in a sterile fashion.  The eyes taped closed with Steri-Strips after first registering the patient to the fusion system.  At this point, Afrin pledgets were pledgets were placed in both sides of the nose.  After several minutes, the right-sided pledgets were removed and the lateral nasal wall was injected with 2% lidocaine with 1:100,000 epinephrine in the standard fashion using a 0-degree telescope.  The middle turbinate was medialized using a Soil scientist and polypoid tissue was then removed from the middle meatus and then in the ethmoid cavity using microdebrider.  This was done to the normal barriers of the skull base superiorly, lamina papyracea laterally, and middle turbinate medially. This was done back to the sphenoid rostrum.  The natural ostium of the sphenoid was found with suction tip and image guidance and was widened, then laterally and inferiorly using the microdebrider.  Polypoid tissue was removed from within the sphenoid sinus using the microdebrider.  The lateral wall was then inspected with an angled telescope and the maxillary ostium was patent, but was widened further using curved microdebrider.  The angle telescope  was then used to follow the skull base in a retrograde fashion, removing polypoid tissue from the skull base to the frontal recess area where polypoid tissue was removed from the frontal recess as well.  Afrin pledgets were placed in the right ethmoid cavity.  The same procedure was then carried on the left side including local injections of the lateral nasal wall, medialization of middle turbinate, removal of polypoid tissue from the middle meatus and ethmoid  cavity, widening of the sphenoid opening with removal of polypoid tissue from within the sphenoid, widening of the maxillary ostium on this side, and retrograde dissection of the skull base including into the frontal recess with removal of polypoid tissue.  On the left, the maxillary sinus was full of mucopus and was suctioned of the fluid.  Afrin pledgets were also placed in this side.  At this point, pledgets were removed from the right side.  The contoured frontal ProPell implant was then placed into the frontal recess.  The ethmoid implant was placed into the ethmoid cavity.  The ethmoid cavity was then packed with half of the Stammberger pack soaked in Decadron and water. The same was then done on the left side.  The nasal passages and throat were suctioned.  She was then returned to anesthesia for wake-up, was extubated and moved to the recovery in stable condition.     Onnie Graham, MD   ______________________________ Onnie Graham, MD    DDB/MEDQ  D:  01/25/2017  T:  01/26/2017  Job:  [id number]

## 2017-02-01 IMAGING — CR DG CHEST 1V PORT
1 series · 1 of 1 positions shown · non-contrast
Comparison: None.

CLINICAL DATA: Sepsis

EXAM:
PORTABLE CHEST - 1 VIEW

[AP]
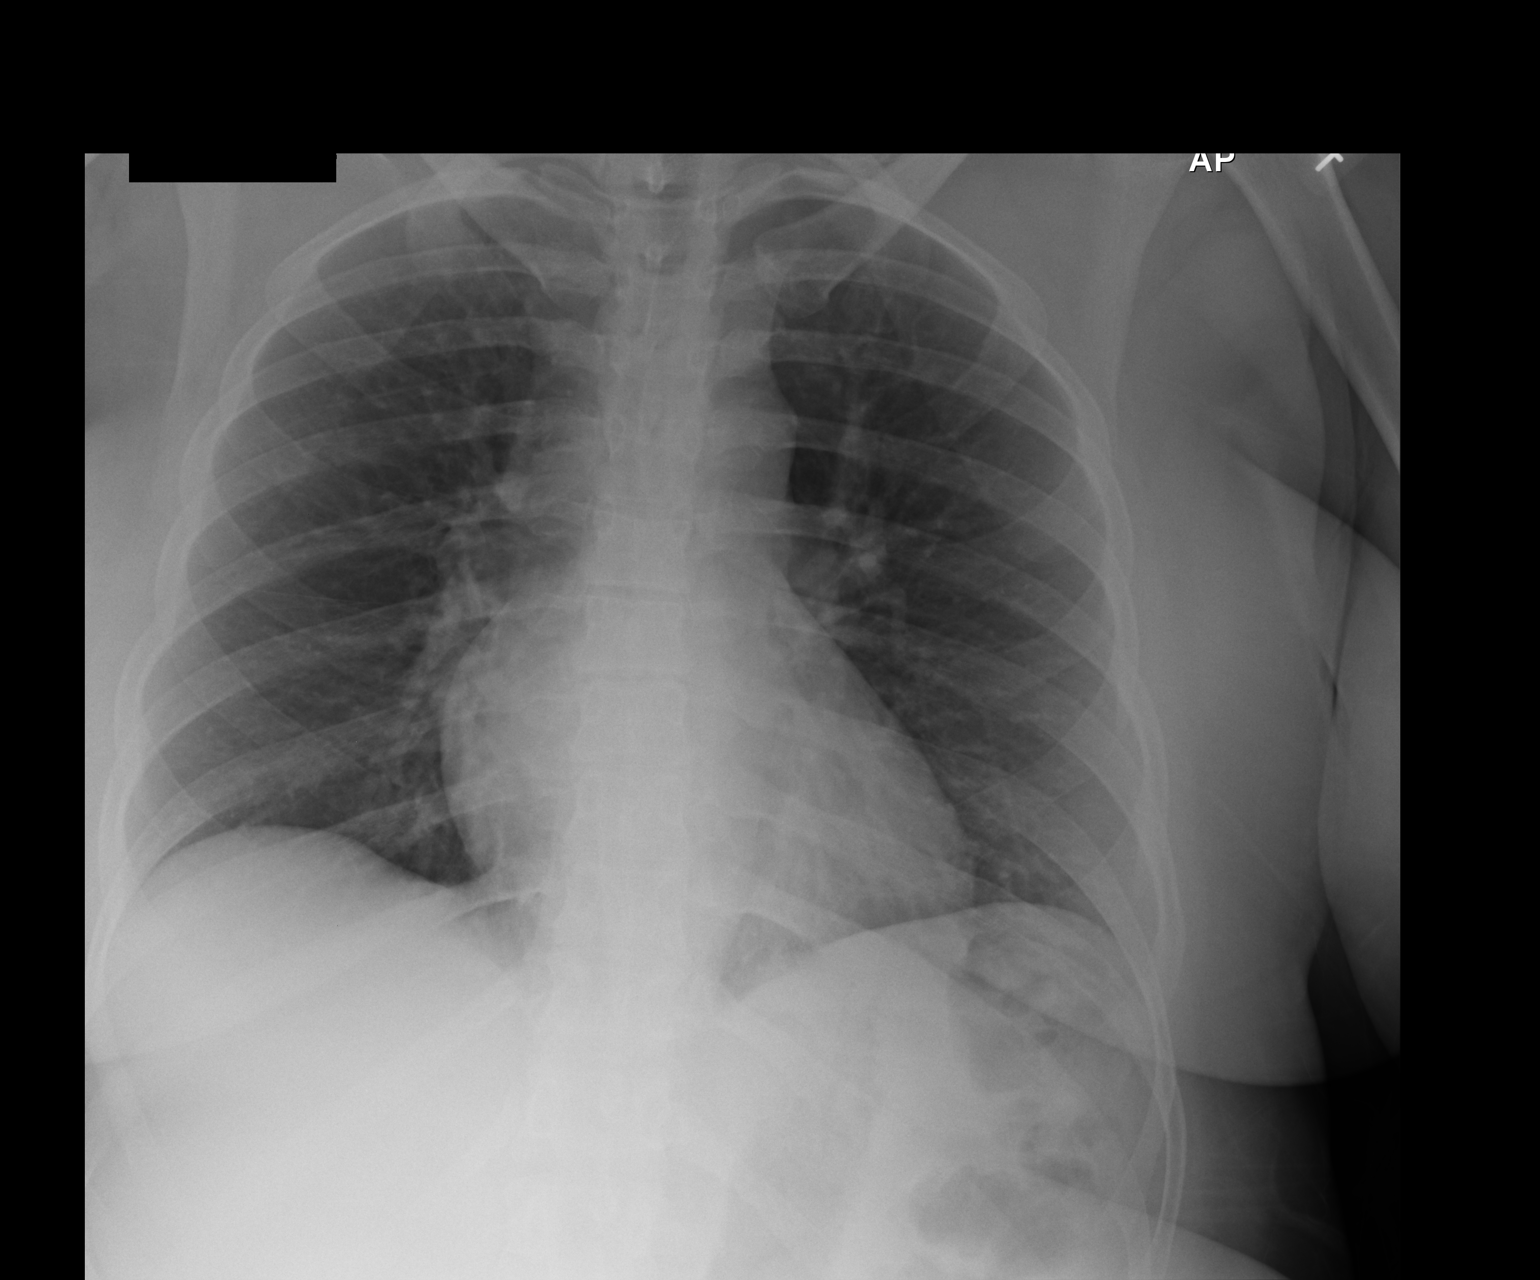

[1 of 1 positions shown; findings below may reference images not displayed]

FINDINGS: A single AP portable view of the chest demonstrates no focal
airspace consolidation or alveolar edema. The lungs are grossly
clear. There is no large effusion or pneumothorax. Cardiac and
mediastinal contours appear unremarkable.
IMPRESSION: No active disease.

## 2017-03-24 ENCOUNTER — Ambulatory Visit (INDEPENDENT_AMBULATORY_CARE_PROVIDER_SITE_OTHER): Payer: Medicaid Other

## 2017-03-24 ENCOUNTER — Other Ambulatory Visit (HOSPITAL_COMMUNITY)
Admission: RE | Admit: 2017-03-24 | Discharge: 2017-03-24 | Disposition: A | Discharge status: Home / Self Care | Payer: Medicaid Other | Source: Ambulatory Visit | Attending: Obstetrics | Admitting: Obstetrics

## 2017-03-24 VITALS — BP 128/85 | HR 81 | Wt 226.0 lb

## 2017-03-24 DIAGNOSIS — Z3202 Encounter for pregnancy test, result negative: Secondary | ICD-10-CM | POA: Diagnosis not present

## 2017-03-24 DIAGNOSIS — Z3200 Encounter for pregnancy test, result unknown: Secondary | ICD-10-CM | POA: Diagnosis not present

## 2017-03-24 DIAGNOSIS — L298 Other pruritus: Secondary | ICD-10-CM | POA: Insufficient documentation

## 2017-03-24 DIAGNOSIS — N898 Other specified noninflammatory disorders of vagina: Secondary | ICD-10-CM

## 2017-03-24 LAB — POCT URINE PREGNANCY: PREG TEST UR: NEGATIVE

## 2017-03-24 NOTE — Progress Notes (Signed)
Patient came in for a pregnancy test. And results are negative. Patient stated she had taken a home pregnancy test that resulted positive. Which concerned her because she has had a tuba ligation for 9 yrs. She states she is having concerns about a e-topic  pregnancy.. Then patient begins to states concern about BV. Lauren Garza had patient do a self swab & advised to make a office visit for any other concerns.

## 2017-03-25 ENCOUNTER — Other Ambulatory Visit: Payer: Self-pay | Admitting: Obstetrics and Gynecology

## 2017-03-25 LAB — CERVICOVAGINAL ANCILLARY ONLY
BACTERIAL VAGINITIS: POSITIVE — AB
Candida vaginitis: NEGATIVE

## 2017-03-26 ENCOUNTER — Other Ambulatory Visit: Payer: Self-pay | Admitting: *Deleted

## 2017-03-26 ENCOUNTER — Other Ambulatory Visit: Payer: Self-pay | Admitting: Obstetrics

## 2017-03-26 DIAGNOSIS — N760 Acute vaginitis: Principal | ICD-10-CM

## 2017-03-26 DIAGNOSIS — B9689 Other specified bacterial agents as the cause of diseases classified elsewhere: Secondary | ICD-10-CM

## 2017-03-26 MED ORDER — TINIDAZOLE 500 MG PO TABS: 1.0000 g | ORAL_TABLET | Freq: Every day | ORAL | 0 refills | Status: DC

## 2017-03-26 MED ORDER — METRONIDAZOLE 500 MG PO TABS: 500.0000 mg | ORAL_TABLET | Freq: Two times a day (BID) | ORAL | 2 refills | Status: DC

## 2017-03-26 NOTE — Progress Notes (Unsigned)
Patient notified of her results- she has reaction to metronidazole- so she request tidanidazole instead. Rx sent to pharmacy per patient request.

## 2017-04-23 ENCOUNTER — Encounter: Payer: Self-pay | Admitting: Obstetrics and Gynecology

## 2017-04-27 ENCOUNTER — Other Ambulatory Visit: Payer: Self-pay

## 2017-04-27 DIAGNOSIS — B9689 Other specified bacterial agents as the cause of diseases classified elsewhere: Secondary | ICD-10-CM

## 2017-04-27 DIAGNOSIS — N760 Acute vaginitis: Principal | ICD-10-CM

## 2017-04-27 MED ORDER — TINIDAZOLE 500 MG PO TABS: 1.0000 g | ORAL_TABLET | Freq: Every day | ORAL | 0 refills | Status: AC

## 2017-04-27 NOTE — Telephone Encounter (Signed)
Please provide refill. If symptoms persists, she will have to come in for evaluation  Bentley Fissel

## 2017-05-12 ENCOUNTER — Encounter (HOSPITAL_BASED_OUTPATIENT_CLINIC_OR_DEPARTMENT_OTHER): Payer: Self-pay | Admitting: *Deleted

## 2017-05-12 ENCOUNTER — Emergency Department (HOSPITAL_BASED_OUTPATIENT_CLINIC_OR_DEPARTMENT_OTHER)
Admission: EM | Admit: 2017-05-12 | Discharge: 2017-05-12 | Disposition: A | Discharge status: Home / Self Care | Payer: Medicaid Other | Attending: Emergency Medicine | Admitting: Emergency Medicine

## 2017-05-12 DIAGNOSIS — J4541 Moderate persistent asthma with (acute) exacerbation: Secondary | ICD-10-CM | POA: Insufficient documentation

## 2017-05-12 DIAGNOSIS — J0100 Acute maxillary sinusitis, unspecified: Secondary | ICD-10-CM | POA: Diagnosis not present

## 2017-05-12 DIAGNOSIS — Z79899 Other long term (current) drug therapy: Secondary | ICD-10-CM | POA: Diagnosis not present

## 2017-05-12 DIAGNOSIS — I10 Essential (primary) hypertension: Secondary | ICD-10-CM | POA: Diagnosis not present

## 2017-05-12 DIAGNOSIS — Z87891 Personal history of nicotine dependence: Secondary | ICD-10-CM | POA: Insufficient documentation

## 2017-05-12 DIAGNOSIS — R0602 Shortness of breath: Secondary | ICD-10-CM | POA: Diagnosis present

## 2017-05-12 HISTORY — DX: Essential (primary) hypertension: I10

## 2017-05-12 HISTORY — DX: Unspecified asthma, uncomplicated: J45.909

## 2017-05-12 MED ORDER — DOXYCYCLINE HYCLATE 100 MG PO CAPS: 100.0000 mg | ORAL_CAPSULE | Freq: Two times a day (BID) | ORAL | 0 refills | Status: DC

## 2017-05-12 MED ORDER — IPRATROPIUM-ALBUTEROL 0.5-2.5 (3) MG/3ML IN SOLN
3.0000 mL | Freq: Four times a day (QID) | RESPIRATORY_TRACT | Status: DC
  Administered 2017-05-12: 3 mL via RESPIRATORY_TRACT
  Filled 2017-05-12: qty 3

## 2017-05-12 MED FILL — DOXYCYCLINE HYC 100 MG CAP: 100 | 7 days supply | Qty: 14 | Fill #0

## 2017-05-12 NOTE — ED Triage Notes (Signed)
Pt has asthma. States she woke up SOB today. Has used her maintenance inhaler. Also c/o Sinus symptoms

## 2017-05-12 NOTE — Discharge Instructions (Signed)
Take antibiotic as directed. Work note provided. Usual albuterol inhaler 2 puffs every 6 hours next 7 days. Return for any new or worse symptoms. Make an appointment follow your regular doctor.

## 2017-05-12 NOTE — ED Provider Notes (Signed)
[facility]PT [facility] Provider Note   CSN: [id number] Arrival date & time: 05/12/17  4709     History   Chief Complaint Chief Complaint  Patient presents with  . Shortness of Breath    HPI Lauren Garza is a 36 y.o. female.  Patient with 2 complaints. Patient with congestion and sinus pressures had a history of sinusitis in the past and feels like she's having acute sinus infection. Patient also with wheezing. Does have albuterol at home. Patient denies any fevers. Short of breath when she awoke this morning. Associated with cough headache and sinus pressure.      Past Medical History:  Diagnosis Date  . Abnormal Pap smear   . Asthma   . Bacterial vaginosis   . Chlamydia trachomatis infection 2012  . Family history of adverse reaction to anesthesia    mom has n and V past surgery  . Hypertension     Patient Active Problem List   Diagnosis Date Noted  . Septic joint of right wrist ([place]) 04/04/2015  . Septic arthritis of wrist, right ([place]) 04/04/2015  . Intramural leiomyoma of uterus 01/02/2015  . Pelvic pain in female 01/02/2015    Past Surgical History:  Procedure Laterality Date  . GYNECOLOGIC CRYOSURGERY    . I&D EXTREMITY Right 04/04/2015   Procedure: IRRIGATION AND DEBRIDEMENT RIGHT WRIST;  Surgeon: Leanora Cover, MD;  Location: [facility];  Service: Orthopedics;  Laterality: Right;  . SINUS ENDO WITH FUSION N/A 01/25/2017   Procedure: SINUS ENDO WITH FUSION;  Surgeon: Melida Quitter, MD;  Location: [facility];  Service: ENT;  Laterality: N/A;  . TUBAL LIGATION    . WISDOM TOOTH EXTRACTION    . WRIST SURGERY      OB History    Gravida Para Term Preterm AB Living   3 3 3  0 0 3   SAB TAB Ectopic Multiple Live Births   0 0 0 0         Home Medications    Prior to Admission medications   Medication Sig Start Date End Date Taking? Authorizing Provider  Albuterol Sulfate (PROAIR HFA IN) Inhale into the lungs as needed.   Yes [provider]  budesonide-formoterol (SYMBICORT) 160-4.5 MCG/ACT inhaler Inhale 2 puffs into the lungs 2 (two) times daily.   Yes [provider]  EPINEPHrine 0.3 mg/0.3 mL IJ SOAJ injection Inject into the muscle as needed.   Yes [provider]  triamterene-hydrochlorothiazide (DYAZIDE) 37.5-25 MG capsule Take 1 capsule by mouth daily.   Yes [provider]  amoxicillin-clavulanate (AUGMENTIN) 875-125 MG tablet Take 1 tablet by mouth 2 (two) times daily. 01/25/17   Melida Quitter, MD  doxycycline (VIBRAMYCIN) 100 MG capsule Take 1 capsule (100 mg total) by mouth 2 (two) times daily. 05/12/17   Fredia Sorrow, MD  HYDROcodone-acetaminophen (NORCO/VICODIN) 5-325 MG tablet Take 1-2 tablets by mouth every 6 (six) hours as needed for moderate pain. 01/25/17   Melida Quitter, MD  promethazine (PHENERGAN) 12.5 MG tablet Take 1 tablet (12.5 mg total) by mouth every 6 (six) hours as needed for nausea or vomiting. 04/06/15   Mendel Corning, MD    Family History Family History  Problem Relation Age of Onset  . Heart attack Father   . Hypertension Mother   . Diabetes Maternal Grandmother     Social History Social History  Substance Use Topics  . Smoking status: Former Research scientist (life sciences)  . Smokeless tobacco: Never Used  . Alcohol use  Yes     Comment: Rare, 1x year     Allergies   Clindamycin/lincomycin and Flagyl [metronidazole hcl]   Review of Systems Review of Systems  Constitutional: Negative for fever.  HENT: Positive for sinus pain and sinus pressure.   Eyes: Negative for redness.  Respiratory: Positive for cough, shortness of breath and wheezing.   Cardiovascular: Negative for chest pain.  Gastrointestinal: Negative for abdominal pain.  Endocrine: Negative for polydipsia.  Musculoskeletal: Negative for back pain.  Skin: Negative for rash.  Neurological: Positive for headaches.  Hematological: Does not bruise/bleed easily.  Psychiatric/Behavioral: Negative for  confusion.     Physical Exam Updated Vital Signs BP 128/85 (BP Location: Right Arm)   Pulse 72   Temp 97.9 F (36.6 C) (Oral)   Resp 18   Ht 1.575 m (5\' 2" )   Wt 99.3 kg (219 lb)   LMP 04/28/2017 (Approximate)   SpO2 100%   BMI 40.06 kg/m   Physical Exam  Constitutional: She is oriented to person, place, and time. She appears well-developed and well-nourished. No distress.  HENT:  Head: Normocephalic and atraumatic.  Mouth/Throat: Oropharynx is clear and moist.  Pressure in the maxillary sinus area.  Eyes: Conjunctivae and EOM are normal. Pupils are equal, round, and reactive to light.  Neck: Normal range of motion. Neck supple.  Cardiovascular: Normal rate, regular rhythm and normal heart sounds.   Pulmonary/Chest: Effort normal. No respiratory distress. She has wheezes.  Abdominal: Soft. Bowel sounds are normal. There is no tenderness.  Musculoskeletal: Normal range of motion.  Neurological: She is alert and oriented to person, place, and time. No cranial nerve deficit. She exhibits normal muscle tone. Coordination normal.  Skin: Skin is warm.  Nursing note and vitals reviewed.    ED Treatments / Results  Labs (all labs ordered are listed, but only abnormal results are displayed) Labs Reviewed - No data to display  EKG  EKG Interpretation None       Radiology No results found.  Procedures Procedures (including critical care time)  Medications Ordered in ED Medications  ipratropium-albuterol (DUONEB) 0.5-2.5 (3) MG/3ML nebulizer solution 3 mL (3 mLs Nebulization Given 05/12/17 1018)     Initial Impression / Assessment and Plan / ED Course  I have reviewed the triage vital signs and the nursing notes.  Pertinent labs & imaging results that were available during my care of the patient were reviewed by me and considered in my medical decision making (see chart for details).     Patient with concern about sinusitis. Has bilateral maxillary pain. History  of sinusitis in the past. Patient states she usually needs antibiotics. Also an exacerbation of her asthma. States she has inhalers at home but is using it for maintenance not using it for rescue. Patient with some wheezing when she first arrived given the lies her treatment here with resolution of the wheezing and breathing feels better. Will start on antibiotics for the sinusitis and follow-up with her doctors  Work note provided.  Final Clinical Impressions(s) / ED Diagnoses   Final diagnoses:  Acute non-recurrent maxillary sinusitis  Moderate persistent asthma with exacerbation    New Prescriptions New Prescriptions   DOXYCYCLINE (VIBRAMYCIN) 100 MG CAPSULE    Take 1 capsule (100 mg total) by mouth 2 (two) times daily.     Fredia Sorrow, MD 05/12/17 1057

## 2017-05-12 NOTE — ED Notes (Signed)
ED Provider at bedside. 

## 2017-05-21 ENCOUNTER — Other Ambulatory Visit: Payer: Self-pay | Admitting: Obstetrics

## 2017-05-21 DIAGNOSIS — N760 Acute vaginitis: Principal | ICD-10-CM

## 2017-05-21 DIAGNOSIS — B9689 Other specified bacterial agents as the cause of diseases classified elsewhere: Secondary | ICD-10-CM

## 2017-05-24 ENCOUNTER — Other Ambulatory Visit: Payer: Self-pay | Admitting: Obstetrics

## 2017-05-29 ENCOUNTER — Emergency Department (HOSPITAL_BASED_OUTPATIENT_CLINIC_OR_DEPARTMENT_OTHER)
Admission: EM | Admit: 2017-05-29 | Discharge: 2017-05-29 | Disposition: A | Discharge status: Home / Self Care | Payer: Medicaid Other | Attending: Emergency Medicine | Admitting: Emergency Medicine

## 2017-05-29 ENCOUNTER — Encounter (HOSPITAL_BASED_OUTPATIENT_CLINIC_OR_DEPARTMENT_OTHER): Payer: Self-pay | Admitting: *Deleted

## 2017-05-29 DIAGNOSIS — Y9389 Activity, other specified: Secondary | ICD-10-CM | POA: Insufficient documentation

## 2017-05-29 DIAGNOSIS — J45909 Unspecified asthma, uncomplicated: Secondary | ICD-10-CM | POA: Diagnosis not present

## 2017-05-29 DIAGNOSIS — Z87891 Personal history of nicotine dependence: Secondary | ICD-10-CM | POA: Insufficient documentation

## 2017-05-29 DIAGNOSIS — Y929 Unspecified place or not applicable: Secondary | ICD-10-CM | POA: Insufficient documentation

## 2017-05-29 DIAGNOSIS — I10 Essential (primary) hypertension: Secondary | ICD-10-CM | POA: Insufficient documentation

## 2017-05-29 DIAGNOSIS — M791 Myalgia: Secondary | ICD-10-CM | POA: Diagnosis not present

## 2017-05-29 DIAGNOSIS — S4992XA Unspecified injury of left shoulder and upper arm, initial encounter: Secondary | ICD-10-CM | POA: Diagnosis present

## 2017-05-29 DIAGNOSIS — S46812A Strain of other muscles, fascia and tendons at shoulder and upper arm level, left arm, initial encounter: Secondary | ICD-10-CM | POA: Diagnosis not present

## 2017-05-29 DIAGNOSIS — Y999 Unspecified external cause status: Secondary | ICD-10-CM | POA: Diagnosis not present

## 2017-05-29 DIAGNOSIS — Z79899 Other long term (current) drug therapy: Secondary | ICD-10-CM | POA: Insufficient documentation

## 2017-05-29 DIAGNOSIS — M7918 Myalgia, other site: Secondary | ICD-10-CM

## 2017-05-29 HISTORY — DX: Gastro-esophageal reflux disease without esophagitis: K21.9

## 2017-05-29 MED ORDER — METHOCARBAMOL 500 MG PO TABS: 500.0000 mg | ORAL_TABLET | Freq: Four times a day (QID) | ORAL | 0 refills | Status: DC | PRN
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
## 2017-05-29 NOTE — ED Notes (Signed)
Pt ambulatory unassisted, in NAD. 

## 2017-06-11 ENCOUNTER — Other Ambulatory Visit: Payer: Self-pay | Admitting: Obstetrics

## 2017-06-28 ENCOUNTER — Encounter: Payer: Self-pay | Admitting: Obstetrics and Gynecology

## 2017-07-01 ENCOUNTER — Encounter: Payer: Self-pay | Admitting: Obstetrics and Gynecology

## 2017-07-01 ENCOUNTER — Ambulatory Visit (INDEPENDENT_AMBULATORY_CARE_PROVIDER_SITE_OTHER): Payer: Medicaid Other | Admitting: Obstetrics and Gynecology

## 2017-07-01 ENCOUNTER — Encounter: Payer: Self-pay | Admitting: Obstetrics

## 2017-07-01 ENCOUNTER — Other Ambulatory Visit (HOSPITAL_COMMUNITY)
Admission: RE | Admit: 2017-07-01 | Discharge: 2017-07-01 | Disposition: A | Discharge status: Home / Self Care | Payer: Medicaid Other | Source: Ambulatory Visit | Attending: Obstetrics and Gynecology | Admitting: Obstetrics and Gynecology

## 2017-07-01 VITALS — BP 131/92 | HR 103 | Ht 62.0 in | Wt 220.5 lb

## 2017-07-01 DIAGNOSIS — N938 Other specified abnormal uterine and vaginal bleeding: Secondary | ICD-10-CM

## 2017-07-01 DIAGNOSIS — N760 Acute vaginitis: Secondary | ICD-10-CM | POA: Diagnosis present

## 2017-07-01 DIAGNOSIS — D251 Intramural leiomyoma of uterus: Secondary | ICD-10-CM | POA: Diagnosis not present

## 2017-07-01 DIAGNOSIS — Z8619 Personal history of other infectious and parasitic diseases: Secondary | ICD-10-CM | POA: Insufficient documentation

## 2017-07-01 NOTE — Progress Notes (Signed)
Pt c/o AUB x 15 days. VB is not heavy; pt wears panty liners. Pt denies discharge but states VB has an odor.

## 2017-07-01 NOTE — Patient Instructions (Signed)

## 2017-07-01 NOTE — Progress Notes (Signed)
36 yo G3P3 here for the evaluation of DUB and vaginal odor. Patient reports a history of a monthly 7-day cycle but over the course of the past 6 months she has occasionally experienced a 14-day period. She reports some mild cramping pain. The bleeding is not described as heavy but rather an additional 7 days of spotting. She also reports the presence of a vaginal odor with some pruritis.  Past Medical History:  Diagnosis Date  . Abnormal Pap smear   . Acid reflux   . Asthma   . Bacterial vaginosis   . Chlamydia trachomatis infection 2012  . Family history of adverse reaction to anesthesia    mom has n and V past surgery  . Hypertension    Past Surgical History:  Procedure Laterality Date  . GYNECOLOGIC CRYOSURGERY    . I&D EXTREMITY Right 04/04/2015   Procedure: IRRIGATION AND DEBRIDEMENT RIGHT WRIST;  Surgeon: Leanora Cover, MD;  Location: [place];  Service: Orthopedics;  Laterality: Right;  . SINUS ENDO WITH FUSION N/A 01/25/2017   Procedure: SINUS ENDO WITH FUSION;  Surgeon: Melida Quitter, MD;  Location: [place];  Service: ENT;  Laterality: N/A;  . TUBAL LIGATION    . WISDOM TOOTH EXTRACTION    . WRIST SURGERY     Family History  Problem Relation Age of Onset  . Heart attack Father   . Hypertension Mother   . Diabetes Maternal Grandmother    Social History  Substance Use Topics  . Smoking status: Former Research scientist (life sciences)  . Smokeless tobacco: Never Used  . Alcohol use Yes     Comment: Rare, 1x year   ROS See pertinent in HPI  Blood pressure (!) 131/92, pulse (!) 103, height 5\' 2"  (1.575 m), weight 220 lb 8 oz (100 kg). GENERAL: Well-developed, well-nourished female in no acute distress.  ABDOMEN: Soft, nontender, nondistended. PELVIC: Normal external female genitalia. Vagina is pink and rugated.  Normal discharge. Normal appearing cervix. Uterus is normal in size. No adnexal mass or tenderness. EXTREMITIES: No cyanosis, clubbing, or edema, 2+ distal pulses.  A/P 36 yo  with DUB and vaginitis - Discussed medical management of bleeding with contraception. Patient is interested in Mirena IUD - Wet prep and cultures collected - patient will be contacted with any abnormal results - patient will return for IUD insertion

## 2017-07-02 ENCOUNTER — Encounter: Payer: Self-pay | Admitting: Obstetrics and Gynecology

## 2017-07-02 LAB — CERVICOVAGINAL ANCILLARY ONLY
BACTERIAL VAGINITIS: NEGATIVE
CANDIDA VAGINITIS: NEGATIVE
CHLAMYDIA, DNA PROBE: NEGATIVE
NEISSERIA GONORRHEA: NEGATIVE
TRICH (WINDOWPATH): NEGATIVE

## 2017-07-06 ENCOUNTER — Telehealth: Payer: Self-pay | Admitting: *Deleted

## 2017-07-06 ENCOUNTER — Other Ambulatory Visit: Payer: Self-pay | Admitting: Obstetrics and Gynecology

## 2017-07-06 MED ORDER — ETONOGESTREL-ETHINYL ESTRADIOL 0.12-0.015 MG/24HR VA RING: VAGINAL_RING | VAGINAL | 6 refills | Status: DC

## 2017-07-06 NOTE — Telephone Encounter (Signed)
Pt called to office for results of last visit. Pt made aware of negative results.  Pt made aware of Dr Elly Modena recommendation for management with Birth Control. Pt states she is not sure that she wants to get a Mirena at this time. Pt request Rx for Nuva Ring. Pt would like to try this method first and then may consider Mirena.  Pt made aware request to be sent to provider for approval and Rx to be sent. Pt states she is currently on day 20 of bleeding.  Pt would like to know if she should start Nuva Ring now or wait until bleeding may subside.    Please advise.

## 2017-07-06 NOTE — Telephone Encounter (Signed)
Pt made aware of Rx and recommendations. Pt advised to start [facility] and if bleeding continues to contact office. Pt advised that she may want to schedule a f/u appt with Dr Elly Modena in order to discuss cycle control since she was only given a 6 month supply. Pt states understanding.

## 2017-07-06 NOTE — Telephone Encounter (Signed)
Rx NuvaRing has been sent to [facility] in [place]. I only gave her a 6 month supply due to her hypertension. Long term use of birth control containing estrogen (like the nuvaring) combined with high blood pressure increases your risk of stroke. A 6 month supply is provided to see if it helps control her bleeding. She will have to consider progesterone only birth controls which are progesterone pills, depo-provera, Nexplanon or IUD  Lauren Garza

## 2017-07-09 ENCOUNTER — Encounter: Payer: Self-pay | Admitting: Obstetrics and Gynecology

## 2017-07-14 ENCOUNTER — Encounter: Payer: Self-pay | Admitting: Obstetrics and Gynecology

## 2017-07-15 ENCOUNTER — Encounter: Payer: Self-pay | Admitting: *Deleted

## 2017-07-15 ENCOUNTER — Other Ambulatory Visit: Payer: Self-pay | Admitting: Obstetrics and Gynecology

## 2017-07-15 ENCOUNTER — Other Ambulatory Visit: Payer: Self-pay | Admitting: *Deleted

## 2017-07-15 MED ORDER — TINIDAZOLE 500 MG PO TABS: 2.0000 g | ORAL_TABLET | Freq: Every day | ORAL | 2 refills | Status: DC

## 2017-07-15 NOTE — Progress Notes (Signed)
Patient called - Rx sent to wrong pharmacy- rerouted to pharmacy of choice.

## 2017-08-03 ENCOUNTER — Encounter (HOSPITAL_BASED_OUTPATIENT_CLINIC_OR_DEPARTMENT_OTHER): Payer: Self-pay | Admitting: Emergency Medicine

## 2017-08-03 ENCOUNTER — Emergency Department (HOSPITAL_BASED_OUTPATIENT_CLINIC_OR_DEPARTMENT_OTHER): Payer: Medicaid Other

## 2017-08-03 ENCOUNTER — Emergency Department (HOSPITAL_BASED_OUTPATIENT_CLINIC_OR_DEPARTMENT_OTHER)
Admission: EM | Admit: 2017-08-03 | Discharge: 2017-08-03 | Disposition: A | Discharge status: Home / Self Care | Payer: Medicaid Other | Attending: Emergency Medicine | Admitting: Emergency Medicine

## 2017-08-03 DIAGNOSIS — R102 Pelvic and perineal pain: Secondary | ICD-10-CM | POA: Diagnosis present

## 2017-08-03 DIAGNOSIS — Z87891 Personal history of nicotine dependence: Secondary | ICD-10-CM | POA: Insufficient documentation

## 2017-08-03 DIAGNOSIS — Z79899 Other long term (current) drug therapy: Secondary | ICD-10-CM | POA: Diagnosis not present

## 2017-08-03 DIAGNOSIS — D259 Leiomyoma of uterus, unspecified: Secondary | ICD-10-CM | POA: Diagnosis not present

## 2017-08-03 DIAGNOSIS — J45909 Unspecified asthma, uncomplicated: Secondary | ICD-10-CM | POA: Insufficient documentation

## 2017-08-03 DIAGNOSIS — R1030 Lower abdominal pain, unspecified: Secondary | ICD-10-CM | POA: Insufficient documentation

## 2017-08-03 DIAGNOSIS — I10 Essential (primary) hypertension: Secondary | ICD-10-CM | POA: Insufficient documentation

## 2017-08-03 HISTORY — DX: Other specified abnormal uterine and vaginal bleeding: N93.8

## 2017-08-03 LAB — CBC WITH DIFFERENTIAL/PLATELET
Basophils Absolute: 0 10*3/uL (ref 0.0–0.1)
Basophils Relative: 0 %
EOS ABS: 0.3 10*3/uL (ref 0.0–0.7)
EOS PCT: 3 %
HCT: 38.9 % (ref 36.0–46.0)
Hemoglobin: 12.2 g/dL (ref 12.0–15.0)
LYMPHS ABS: 1.7 10*3/uL (ref 0.7–4.0)
Lymphocytes Relative: 18 %
MCH: 25.6 pg — AB (ref 26.0–34.0)
MCHC: 31.4 g/dL (ref 30.0–36.0)
MCV: 81.7 fL (ref 78.0–100.0)
MONOS PCT: 6 %
Monocytes Absolute: 0.5 10*3/uL (ref 0.1–1.0)
Neutro Abs: 7 10*3/uL (ref 1.7–7.7)
Neutrophils Relative %: 73 %
PLATELETS: 371 10*3/uL (ref 150–400)
RBC: 4.76 MIL/uL (ref 3.87–5.11)
RDW: 15.7 % — ABNORMAL HIGH (ref 11.5–15.5)
WBC: 9.5 10*3/uL (ref 4.0–10.5)

## 2017-08-03 LAB — COMPREHENSIVE METABOLIC PANEL
ALT: 12 U/L — ABNORMAL LOW (ref 14–54)
AST: 17 U/L (ref 15–41)
Albumin: 3.5 g/dL (ref 3.5–5.0)
Alkaline Phosphatase: 101 U/L (ref 38–126)
Anion gap: 8 (ref 5–15)
BUN: 11 mg/dL (ref 6–20)
CHLORIDE: 104 mmol/L (ref 101–111)
CO2: 26 mmol/L (ref 22–32)
Calcium: 9 mg/dL (ref 8.9–10.3)
Creatinine, Ser: 0.93 mg/dL (ref 0.44–1.00)
GFR calc non Af Amer: >60 mL/min (ref >60)
Glucose, Bld: 91 mg/dL (ref 65–99)
POTASSIUM: 3.6 mmol/L (ref 3.5–5.1)
SODIUM: 138 mmol/L (ref 135–145)
Total Bilirubin: 0.2 mg/dL — ABNORMAL LOW (ref 0.3–1.2)
Total Protein: 8 g/dL (ref 6.5–8.1)

## 2017-08-03 LAB — WET PREP, GENITAL
Clue Cells Wet Prep HPF POC: NONE SEEN
SPERM: NONE SEEN
Trich, Wet Prep: NONE SEEN
YEAST WET PREP: NONE SEEN

## 2017-08-03 LAB — LIPASE, BLOOD: Lipase: 23 U/L (ref 11–51)

## 2017-08-03 LAB — URINALYSIS, ROUTINE W REFLEX MICROSCOPIC
BILIRUBIN URINE: NEGATIVE
Glucose, UA: NEGATIVE mg/dL
Ketones, ur: NEGATIVE mg/dL
Leukocytes, UA: NEGATIVE
NITRITE: NEGATIVE
Protein, ur: NEGATIVE mg/dL
SPECIFIC GRAVITY, URINE: 1.01 (ref 1.005–1.030)
pH: 6.5 (ref 5.0–8.0)

## 2017-08-03 LAB — URINALYSIS, MICROSCOPIC (REFLEX)

## 2017-08-03 LAB — PREGNANCY, URINE: PREG TEST UR: NEGATIVE

## 2017-08-03 MED ORDER — SODIUM CHLORIDE 0.9 % IV BOLUS (SEPSIS)
1000.0000 mL | Freq: Once | INTRAVENOUS | Status: AC
  Administered 2017-08-03: 1000 mL via INTRAVENOUS

## 2017-08-03 MED ORDER — KETOROLAC TROMETHAMINE 30 MG/ML IJ SOLN
30.0000 mg | Freq: Once | INTRAMUSCULAR | Status: AC
  Administered 2017-08-03: 30 mg via INTRAVENOUS
  Filled 2017-08-03: qty 1

## 2017-08-03 MED ORDER — HYDROCODONE-ACETAMINOPHEN 5-325 MG PO TABS: 1.0000 | ORAL_TABLET | ORAL | 0 refills | Status: DC | PRN

## 2017-08-03 MED ORDER — IOPAMIDOL (ISOVUE-300) INJECTION 61%
100.0000 mL | Freq: Once | INTRAVENOUS | Status: AC | PRN
Start: 2017-08-03 — End: 2017-08-03
  Administered 2017-08-03: 100 mL via INTRAVENOUS

## 2017-08-03 MED ORDER — MORPHINE SULFATE (PF) 4 MG/ML IV SOLN
4.0000 mg | Freq: Once | INTRAVENOUS | Status: AC
  Administered 2017-08-03: 4 mg via INTRAVENOUS
  Filled 2017-08-03: qty 1

## 2017-08-03 MED ORDER — ONDANSETRON HCL 4 MG/2ML IJ SOLN
4.0000 mg | Freq: Once | INTRAMUSCULAR | Status: AC
  Administered 2017-08-03: 4 mg via INTRAVENOUS
  Filled 2017-08-03: qty 2

## 2017-08-03 MED ORDER — IBUPROFEN 600 MG PO TABS: 600.0000 mg | ORAL_TABLET | Freq: Four times a day (QID) | ORAL | 0 refills | Status: DC | PRN

## 2017-08-03 MED FILL — IBUPROFEN 600 MG TABLET: 600 | 7 days supply | Qty: 30 | Fill #0

## 2017-08-03 MED FILL — HYDROCODON-APAP 5-325: 5-325 | 1 days supply | Qty: 10 | Fill #0

## 2017-08-03 NOTE — ED Triage Notes (Signed)
Pt having lower back pain and lower pelvic pain.  Pt states it started when she had her period.  Pt states her flow is a little heavier with blood clots.  Pt states the pain is a dull ache and she cannot get comfortable.  No fever.  No dysuria.

## 2017-08-03 NOTE — ED Notes (Signed)
Patient transported to CT 

## 2017-08-03 NOTE — ED Provider Notes (Signed)
[place] DEPT [facility] Provider Note   CSN: [id number] Arrival date & time: 08/03/17  0809     History   Chief Complaint Chief Complaint  Patient presents with  . Back Pain  . Pelvic Pain    HPI Lauren Garza is a 36 y.o. female.  Pt presents to the ED today with right sided back and pelvic pain.  Pt has a hx of fibroids and pain with her periods.  She was supposed to get set up for an IUD, but has not yet as she opted for the nuva ring.  The pt started her period on Saturday, Sept. 1.  Pt said she can't get comfortable.  No n/v.  No f/c.      Past Medical History:  Diagnosis Date  . Abnormal Pap smear   . Acid reflux   . Asthma   . Bacterial vaginosis   . Chlamydia trachomatis infection 2012  . DUB (dysfunctional uterine bleeding)   . Family history of adverse reaction to anesthesia    mom has n and V past surgery  . Hypertension     Patient Active Problem List   Diagnosis Date Noted  . Septic joint of right wrist ([place]) 04/04/2015  . Septic arthritis of wrist, right ([name]) 04/04/2015  . Intramural leiomyoma of uterus 01/02/2015  . Pelvic pain in female 01/02/2015    Past Surgical History:  Procedure Laterality Date  . GYNECOLOGIC CRYOSURGERY    . I&D EXTREMITY Right 04/04/2015   Procedure: IRRIGATION AND DEBRIDEMENT RIGHT WRIST;  Surgeon: Leanora Cover, MD;  Location: [place];  Service: Orthopedics;  Laterality: Right;  . SINUS ENDO WITH FUSION N/A 01/25/2017   Procedure: SINUS ENDO WITH FUSION;  Surgeon: Melida Quitter, MD;  Location: [place];  Service: ENT;  Laterality: N/A;  . TUBAL LIGATION    . WISDOM TOOTH EXTRACTION    . WRIST SURGERY      OB History    Gravida Para Term Preterm AB Living   3 3 3  0 0 3   SAB TAB Ectopic Multiple Live Births   0 0 0 0         Home Medications    Prior to Admission medications   Medication Sig Start Date End Date Taking? Authorizing Provider  etonogestrel-ethinyl estradiol (NUVARING)  0.12-0.015 MG/24HR vaginal ring Insert vaginally and leave in place for 3 consecutive weeks, then remove for 1 week. 07/06/17  Yes Constant, Peggy, MD  phentermine (ADIPEX-P) 37.5 MG tablet Take 37.5 mg by mouth daily. 06/08/17  Yes [provider]  Albuterol Sulfate (PROAIR HFA IN) Inhale into the lungs as needed.    [provider]  amoxicillin-clavulanate (AUGMENTIN) 875-125 MG tablet Take 1 tablet by mouth 2 (two) times daily. Patient not taking: Reported on 07/01/2017 01/25/17   Melida Quitter, MD  budesonide-formoterol [facility]) 160-4.5 MCG/ACT inhaler Inhale 2 puffs into the lungs 2 (two) times daily.    [provider]  cetirizine (ZYRTEC) 10 MG tablet  06/09/17   [provider]  EPINEPHrine 0.3 mg/0.3 mL IJ SOAJ injection Inject into the muscle as needed.    [provider]  HYDROcodone-acetaminophen (NORCO/VICODIN) 5-325 MG tablet Take 1 tablet by mouth every 4 (four) hours as needed. 08/03/17   Isla Pence, MD  ibuprofen (ADVIL,MOTRIN) 600 MG tablet Take 1 tablet (600 mg total) by mouth every 6 (six) hours as needed. 08/03/17   Isla Pence, MD  methocarbamol (ROBAXIN) 500 MG tablet Take 1 tablet (500  mg total) by mouth every 6 (six) hours as needed for muscle spasms. 05/29/17   Shary Decamp, PA-C  montelukast (SINGULAIR) 10 MG tablet  04/27/17   [provider]  tinidazole (TINDAMAX) 500 MG tablet Take 4 tablets (2,000 mg total) by mouth daily with breakfast. For two days 07/15/17   Constant, Peggy, MD  triamterene-hydrochlorothiazide (DYAZIDE) 37.5-25 MG capsule Take 1 capsule by mouth daily.    [provider]    Family History Family History  Problem Relation Age of Onset  . Heart attack Father   . Hypertension Mother   . Diabetes Maternal Grandmother     Social History Social History  Substance Use Topics  . Smoking status: Former Research scientist (life sciences)  . Smokeless tobacco: Never Used  . Alcohol use Yes     Comment: Rare, 1x year      Allergies   Clindamycin/lincomycin; Flagyl [metronidazole hcl]; and Lincomycin hcl   Review of Systems Review of Systems  Gastrointestinal: Positive for abdominal pain.  Genitourinary: Positive for flank pain, pelvic pain and vaginal bleeding.  All other systems reviewed and are negative.    Physical Exam Updated Vital Signs BP (!) 131/105 (BP Location: Right Arm)   Pulse 96   Temp 98.3 F (36.8 C) (Oral)   Resp 18   Ht 5\' 2"  (1.575 m)   Wt 98 kg (216 lb)   LMP 07/31/2017   SpO2 100%   BMI 39.51 kg/m   Physical Exam  Constitutional: She is oriented to person, place, and time. She appears well-developed. She appears distressed.  HENT:  Head: Normocephalic and atraumatic.  Right Ear: External ear normal.  Left Ear: External ear normal.  Nose: Nose normal.  Mouth/Throat: Oropharynx is clear and moist.  Eyes: Pupils are equal, round, and reactive to light. Conjunctivae and EOM are normal.  Neck: Normal range of motion. Neck supple.  Cardiovascular: Normal rate, regular rhythm, normal heart sounds and intact distal pulses.   Pulmonary/Chest: Effort normal and breath sounds normal.  Abdominal: Soft. There is tenderness in the right lower quadrant, suprapubic area and left lower quadrant.  Genitourinary: Uterus normal. Right adnexum displays tenderness. Left adnexum displays tenderness. There is bleeding in the vagina.  Musculoskeletal: Normal range of motion.  Neurological: She is alert and oriented to person, place, and time.  Skin: Skin is warm.  Psychiatric: She has a normal mood and affect. Her behavior is normal. Judgment and thought content normal.  Nursing note and vitals reviewed.    ED Treatments / Results  Labs (all labs ordered are listed, but only abnormal results are displayed) Labs Reviewed  WET PREP, GENITAL - Abnormal; Notable for the following:       Result Value   WBC, Wet Prep HPF POC MODERATE (*)    All other components within normal limits   CBC WITH DIFFERENTIAL/PLATELET - Abnormal; Notable for the following:    MCH 25.6 (*)    RDW 15.7 (*)    All other components within normal limits  COMPREHENSIVE METABOLIC PANEL - Abnormal; Notable for the following:    ALT 12 (*)    Total Bilirubin 0.2 (*)    All other components within normal limits  URINALYSIS, ROUTINE W REFLEX MICROSCOPIC - Abnormal; Notable for the following:    Hgb urine dipstick MODERATE (*)    All other components within normal limits  URINALYSIS, MICROSCOPIC (REFLEX) - Abnormal; Notable for the following:    Bacteria, UA RARE (*)    Squamous Epithelial / LPF  0-5 (*)    All other components within normal limits  LIPASE, BLOOD  PREGNANCY, URINE  GC/CHLAMYDIA PROBE AMP ([place]) NOT AT [facility]    EKG  EKG Interpretation None       Radiology Ct Abdomen Pelvis W Contrast  Result Date: 08/03/2017 CLINICAL DATA:  Lower abdominal pain. EXAM: CT ABDOMEN AND PELVIS WITH CONTRAST TECHNIQUE: Multidetector CT imaging of the abdomen and pelvis was performed using the standard protocol following bolus administration of intravenous contrast. CONTRAST:  116mL ISOVUE-300 IOPAMIDOL (ISOVUE-300) INJECTION 61% COMPARISON:  None. FINDINGS: Lower chest: Normal. Hepatobiliary: No focal liver abnormality is seen. No gallstones, gallbladder wall thickening, or biliary dilatation. Pancreas: Unremarkable. No pancreatic ductal dilatation or surrounding inflammatory changes. Spleen: Normal in size without focal abnormality. Adrenals/Urinary Tract: Adrenal glands are unremarkable. Kidneys are normal, without renal calculi, focal lesion, or hydronephrosis. Bladder is unremarkable. Stomach/Bowel: Stomach is within normal limits. Appendix appears normal. No evidence of bowel wall thickening, distention, or inflammatory changes. Vascular/Lymphatic: No significant vascular findings are present. No enlarged abdominal or pelvic lymph nodes. Reproductive: 4.2 cm probable fibroid in the right side  of the uterus. Ovaries appear normal. Other: No abdominal wall hernia or abnormality. No abdominopelvic ascites. Musculoskeletal: No acute or significant osseous findings. IMPRESSION: Uterine fibroid.  Otherwise benign appearing abdomen and pelvis. Electronically Signed   By: Lorriane Shire M.D.   On: 08/03/2017 10:25    Procedures Procedures (including critical care time)  Medications Ordered in ED Medications  morphine 4 MG/ML injection 4 mg (not administered)  morphine 4 MG/ML injection 4 mg (4 mg Intravenous Given 08/03/17 0854)  ondansetron (ZOFRAN) injection 4 mg (4 mg Intravenous Given 08/03/17 0854)  sodium chloride 0.9 % bolus 1,000 mL (0 mLs Intravenous Stopped 08/03/17 1003)  ketorolac (TORADOL) 30 MG/ML injection 30 mg (30 mg Intravenous Given 08/03/17 0955)  iopamidol (ISOVUE-300) 61 % injection 100 mL (100 mLs Intravenous Contrast Given 08/03/17 1004)     Initial Impression / Assessment and Plan / ED Course  I have reviewed the triage vital signs and the nursing notes.  Pertinent labs & imaging results that were available during my care of the patient were reviewed by me and considered in my medical decision making (see chart for details).    Pt is feeling better.  I think the source of her pain is related to her large fibroid.  The pt is instructed to f/u with Dr. Elly Modena (obgyn) and to return if worse.  Final Clinical Impressions(s) / ED Diagnoses   Final diagnoses:  Lower abdominal pain  Uterine leiomyoma, unspecified location    New Prescriptions New Prescriptions   HYDROCODONE-ACETAMINOPHEN (NORCO/VICODIN) 5-325 MG TABLET    Take 1 tablet by mouth every 4 (four) hours as needed.   IBUPROFEN (ADVIL,MOTRIN) 600 MG TABLET    Take 1 tablet (600 mg total) by mouth every 6 (six) hours as needed.     Isla Pence, MD 08/03/17 [phone]

## 2017-08-04 ENCOUNTER — Ambulatory Visit (INDEPENDENT_AMBULATORY_CARE_PROVIDER_SITE_OTHER): Payer: Medicaid Other | Admitting: Obstetrics and Gynecology

## 2017-08-04 VITALS — BP 136/91 | Temp 99.9°F | Ht 62.0 in | Wt 222.3 lb

## 2017-08-04 DIAGNOSIS — Z30430 Encounter for insertion of intrauterine contraceptive device: Secondary | ICD-10-CM

## 2017-08-04 DIAGNOSIS — N938 Other specified abnormal uterine and vaginal bleeding: Secondary | ICD-10-CM

## 2017-08-04 MED ORDER — LEVONORGESTREL 20 MCG/24HR IU IUD
INTRAUTERINE_SYSTEM | Freq: Once | INTRAUTERINE | Status: AC
  Administered 2017-08-04: 16:00:00 via INTRAUTERINE

## 2017-08-04 NOTE — Progress Notes (Signed)
36 yo G3P3 here as an ED follow up for evaluation of pelvic pain. Patient has been using NuvaRing for cycle control and was doing well until it removal of the nuvaring and onset of her period. She describes severe dysmenorrhea. She reports improvement in her pain with ibuprofen. Patient denies any abnormal discharge.  Past Medical History:  Diagnosis Date  . Abnormal Pap smear   . Acid reflux   . Asthma   . Bacterial vaginosis   . Chlamydia trachomatis infection 2012  . DUB (dysfunctional uterine bleeding)   . Family history of adverse reaction to anesthesia    mom has n and V past surgery  . Hypertension    Past Surgical History:  Procedure Laterality Date  . GYNECOLOGIC CRYOSURGERY    . I&D EXTREMITY Right 04/04/2015   Procedure: IRRIGATION AND DEBRIDEMENT RIGHT WRIST;  Surgeon: Leanora Cover, MD;  Location: [place];  Service: Orthopedics;  Laterality: Right;  . SINUS ENDO WITH FUSION N/A 01/25/2017   Procedure: SINUS ENDO WITH FUSION;  Surgeon: Melida Quitter, MD;  Location: [place];  Service: ENT;  Laterality: N/A;  . TUBAL LIGATION    . WISDOM TOOTH EXTRACTION    . WRIST SURGERY     Family History  Problem Relation Age of Onset  . Heart attack Father   . Hypertension Mother   . Diabetes Maternal Grandmother    Social History  Substance Use Topics  . Smoking status: Former Research scientist (life sciences)  . Smokeless tobacco: Never Used  . Alcohol use Yes     Comment: Rare, 1x year   ROS See pertinent in HPI Blood pressure (!) 136/91, temperature 99.9 F (37.7 C), height 5\' 2"  (1.575 m), weight 222 lb 4.8 oz (100.8 kg), last menstrual period 07/31/2017. GENERAL: Well-developed, well-nourished female in no acute distress.  ABDOMEN: Soft, nontender, nondistended.  PELVIC: Normal external female genitalia. Vagina is pink and rugated.  Normal discharge. Normal appearing cervix. Uterus is normal in size. No adnexal mass or tenderness. EXTREMITIES: No cyanosis, clubbing, or edema, 2+  distal pulses.  FINDINGS: Lower chest: Normal.  Hepatobiliary: No focal liver abnormality is seen. No gallstones, gallbladder wall thickening, or biliary dilatation.  Pancreas: Unremarkable. No pancreatic ductal dilatation or surrounding inflammatory changes.  Spleen: Normal in size without focal abnormality.  Adrenals/Urinary Tract: Adrenal glands are unremarkable. Kidneys are normal, without renal calculi, focal lesion, or hydronephrosis. Bladder is unremarkable.  Stomach/Bowel: Stomach is within normal limits. Appendix appears normal. No evidence of bowel wall thickening, distention, or inflammatory changes.  Vascular/Lymphatic: No significant vascular findings are present. No enlarged abdominal or pelvic lymph nodes.  Reproductive: 4.2 cm probable fibroid in the right side of the uterus. Ovaries appear normal.  Other: No abdominal wall hernia or abnormality. No abdominopelvic ascites.  Musculoskeletal: No acute or significant osseous findings.  IMPRESSION: Uterine fibroid.  Otherwise benign appearing abdomen and pelvis.   Electronically Signed   By: Lorriane Shire M.D.   On: 08/03/2017 10:25  A/P 36 yo G3P3 with dysmenorrhea - reviewed CT scan results consistent with ultrasound 09/2016 - discussed again medical management with contraception such as continuous use of COC or progesterone only options. Patient opted for Mirena IUD and agreed to have it placed today IUD Procedure Note Patient identified, informed consent performed, signed copy in chart, time out was performed.  Urine pregnancy test negative on 9/4 and thus not repeated  Speculum placed in the vagina.  Cervix visualized.  Cleaned with Betadine x 2.  Grasped  anteriorly with a single tooth tenaculum.  Uterus sounded to 10 cm.  Mirena IUD placed per manufacturer's recommendations.  Strings trimmed to 3 cm. Tenaculum was removed, good hemostasis noted.  Patient tolerated procedure well.    Patient given post procedure instructions and Mirena care card with expiration date.  Patient is asked to check IUD strings periodically and follow up in 4-6 weeks for IUD check.  - Patient reports normal pap smear 10/2016. Will obtain records

## 2017-08-04 NOTE — Patient Instructions (Signed)

## 2017-08-04 NOTE — Progress Notes (Signed)
Pt presents to f/u from hospital visit yesterday for pelvic pain.

## 2017-08-05 ENCOUNTER — Encounter: Payer: Self-pay | Admitting: *Deleted

## 2017-08-05 LAB — GC/CHLAMYDIA PROBE AMP (~~LOC~~) NOT AT ARMC
Chlamydia: NEGATIVE
Neisseria Gonorrhea: NEGATIVE

## 2017-09-01 ENCOUNTER — Encounter: Payer: Self-pay | Admitting: Obstetrics and Gynecology

## 2017-09-01 ENCOUNTER — Ambulatory Visit (INDEPENDENT_AMBULATORY_CARE_PROVIDER_SITE_OTHER): Payer: Medicaid Other | Admitting: Obstetrics and Gynecology

## 2017-09-01 DIAGNOSIS — Z30431 Encounter for routine checking of intrauterine contraceptive device: Secondary | ICD-10-CM | POA: Diagnosis not present

## 2017-09-01 DIAGNOSIS — N719 Inflammatory disease of uterus, unspecified: Secondary | ICD-10-CM | POA: Diagnosis not present

## 2017-09-01 MED ORDER — DOXYCYCLINE HYCLATE 100 MG PO CAPS: 100.0000 mg | ORAL_CAPSULE | Freq: Two times a day (BID) | ORAL | 0 refills | Status: DC

## 2017-09-01 NOTE — Progress Notes (Signed)
Pt being seen for IUD check today.  Mirena placed 08/04/17. Pt reports doing well. Still with some bleeding Has not been sexual active since insertion. No bowel or bladder dysfunction.  PE AF VSS Lungs clear Heart RRR Abd soft + BS GU IUD strings noted @ 2-3 cm form os, vaginal bleeding noted, uterus small mobile non tende, no masses  A/P IUD check         Endometritis Will give Doxycycline x 10 days. Pt reassured. Instructed to call back if notices no change in bleeding after another 1-2 months. O/W follow up with yearly exam or PRN

## 2017-09-01 NOTE — Patient Instructions (Signed)

## 2017-09-03 ENCOUNTER — Telehealth: Payer: Self-pay | Admitting: Pediatrics

## 2017-09-03 MED ORDER — FLUCONAZOLE 150 MG PO TABS: 150.0000 mg | ORAL_TABLET | Freq: Once | ORAL | 0 refills | Status: AC

## 2017-09-03 MED ORDER — TERCONAZOLE 0.4 % VA CREA
1.0000 | TOPICAL_CREAM | Freq: Every day | VAGINAL | 0 refills | Status: DC
Start: 2017-09-03 — End: 2017-09-03

## 2017-09-03 NOTE — Addendum Note (Signed)
Addended by: Dorothyann Gibbs on: 09/03/2017 09:19 AM   Modules accepted: Orders

## 2017-09-03 NOTE — Telephone Encounter (Signed)
Pt sent MyChart message requesting treatment for yeast d/t ATB rx.  I sent Terazol to pharmacy (per protocol) and responded to MyChart message.

## 2017-09-03 NOTE — Telephone Encounter (Signed)
LATE ENTRY:  Terazol rx cancelled, Diflucan rx sent to pharmacy.

## 2017-09-07 ENCOUNTER — Other Ambulatory Visit: Payer: Self-pay

## 2017-09-07 DIAGNOSIS — N939 Abnormal uterine and vaginal bleeding, unspecified: Secondary | ICD-10-CM

## 2017-09-07 MED ORDER — DESOGESTREL-ETHINYL ESTRADIOL 0.15-30 MG-MCG PO TABS: 1.0000 | ORAL_TABLET | Freq: Every day | ORAL | 11 refills | Status: DC

## 2017-09-07 NOTE — Progress Notes (Unsigned)
Rx sent 

## 2017-09-20 ENCOUNTER — Encounter: Payer: Self-pay | Admitting: Obstetrics and Gynecology

## 2017-09-21 ENCOUNTER — Ambulatory Visit (INDEPENDENT_AMBULATORY_CARE_PROVIDER_SITE_OTHER): Payer: Medicaid Other | Admitting: Obstetrics

## 2017-09-21 ENCOUNTER — Other Ambulatory Visit (HOSPITAL_COMMUNITY)
Admission: RE | Admit: 2017-09-21 | Discharge: 2017-09-21 | Disposition: A | Discharge status: Home / Self Care | Payer: Medicaid Other | Source: Ambulatory Visit | Attending: Obstetrics | Admitting: Obstetrics

## 2017-09-21 ENCOUNTER — Encounter: Payer: Self-pay | Admitting: Obstetrics

## 2017-09-21 ENCOUNTER — Encounter: Payer: Self-pay | Admitting: *Deleted

## 2017-09-21 VITALS — BP 138/79 | HR 90 | Wt 225.0 lb

## 2017-09-21 DIAGNOSIS — N760 Acute vaginitis: Secondary | ICD-10-CM | POA: Diagnosis not present

## 2017-09-21 DIAGNOSIS — B9689 Other specified bacterial agents as the cause of diseases classified elsewhere: Secondary | ICD-10-CM | POA: Diagnosis not present

## 2017-09-21 DIAGNOSIS — A5909 Other urogenital trichomoniasis: Secondary | ICD-10-CM | POA: Diagnosis not present

## 2017-09-21 DIAGNOSIS — B373 Candidiasis of vulva and vagina: Secondary | ICD-10-CM | POA: Diagnosis not present

## 2017-09-21 DIAGNOSIS — B3731 Acute candidiasis of vulva and vagina: Secondary | ICD-10-CM

## 2017-09-21 MED ORDER — TINIDAZOLE 500 MG PO TABS: 1000.0000 mg | ORAL_TABLET | Freq: Every day | ORAL | 2 refills | Status: DC

## 2017-09-21 MED ORDER — FLUCONAZOLE 150 MG PO TABS: 150.0000 mg | ORAL_TABLET | Freq: Once | ORAL | 0 refills | Status: AC

## 2017-09-21 NOTE — Progress Notes (Signed)
Patient ID: Lauren Garza, female   DOB: 1981/02/10, 36 y.o.   MRN: [id number]  Chief Complaint  Patient presents with  . Vaginal Discharge    w/ foul odor since this past Sunday-Mirena placed 2 months ago    HPI Lauren Garza is a 36 y.o. female.  Foul smelling vaginal discharge. HPI  Past Medical History:  Diagnosis Date  . Abnormal Pap smear   . Acid reflux   . Asthma   . Bacterial vaginosis   . Chlamydia trachomatis infection 2012  . DUB (dysfunctional uterine bleeding)   . Family history of adverse reaction to anesthesia    mom has n and V past surgery  . Hypertension     Past Surgical History:  Procedure Laterality Date  . GYNECOLOGIC CRYOSURGERY    . I&D EXTREMITY Right 04/04/2015   Procedure: IRRIGATION AND DEBRIDEMENT RIGHT WRIST;  Surgeon: Leanora Cover, MD;  Location: [place];  Service: Orthopedics;  Laterality: Right;  . SINUS ENDO WITH FUSION N/A 01/25/2017   Procedure: SINUS ENDO WITH FUSION;  Surgeon: Melida Quitter, MD;  Location: [place];  Service: ENT;  Laterality: N/A;  . TUBAL LIGATION    . WISDOM TOOTH EXTRACTION    . WRIST SURGERY      Family History  Problem Relation Age of Onset  . Heart attack Father   . Hypertension Mother   . Diabetes Maternal Grandmother     Social History Social History  Substance Use Topics  . Smoking status: Former Research scientist (life sciences)  . Smokeless tobacco: Never Used  . Alcohol use Yes     Comment: Rare, 1x year    Allergies  Allergen Reactions  . Clindamycin/Lincomycin Hives  . Flagyl [Metronidazole Hcl] Hives  . Lincomycin Hcl Hives    Current Outpatient Prescriptions  Medication Sig Dispense Refill  . Albuterol Sulfate (PROAIR HFA IN) Inhale into the lungs as needed.    . budesonide-formoterol (SYMBICORT) 160-4.5 MCG/ACT inhaler Inhale 2 puffs into the lungs 2 (two) times daily.    . cetirizine (ZYRTEC) 10 MG tablet   0  . desogestrel-ethinyl estradiol (APRI) 0.15-30 MG-MCG tablet Take 1 tablet by  mouth daily. 1 Package 11  . doxycycline (VIBRAMYCIN) 100 MG capsule Take 1 capsule (100 mg total) by mouth 2 (two) times daily. 10 capsule 0  . EPINEPHrine 0.3 mg/0.3 mL IJ SOAJ injection Inject into the muscle as needed.    . etonogestrel-ethinyl estradiol (NUVARING) 0.12-0.015 MG/24HR vaginal ring Insert vaginally and leave in place for 3 consecutive weeks, then remove for 1 week. 1 each 6  . HYDROcodone-acetaminophen (NORCO/VICODIN) 5-325 MG tablet Take 1 tablet by mouth every 4 (four) hours as needed. 10 tablet 0  . ibuprofen (ADVIL,MOTRIN) 600 MG tablet Take 1 tablet (600 mg total) by mouth every 6 (six) hours as needed. 30 tablet 0  . methocarbamol (ROBAXIN) 500 MG tablet Take 1 tablet (500 mg total) by mouth every 6 (six) hours as needed for muscle spasms. 30 tablet 0  . montelukast (SINGULAIR) 10 MG tablet   0  . phentermine (ADIPEX-P) 37.5 MG tablet Take 37.5 mg by mouth daily.  0  . triamterene-hydrochlorothiazide (DYAZIDE) 37.5-25 MG capsule Take 1 capsule by mouth daily.     No current facility-administered medications for this visit.     Review of Systems Review of Systems Constitutional: negative for fatigue and weight loss Respiratory: negative for cough and wheezing Cardiovascular: negative for chest pain, fatigue and palpitations Gastrointestinal: negative for abdominal pain  and change in bowel habits Genitourinary:positive for malodorous vaginal discharge Integument/breast: negative for nipple discharge Musculoskeletal:negative for myalgias Neurological: negative for gait problems and tremors Behavioral/Psych: negative for abusive relationship, depression Endocrine: negative for temperature intolerance      Blood pressure 138/79, pulse 90, weight 225 lb (102.1 kg), last menstrual period 08/27/2017.  Physical Exam Physical Exam           General:  Alert and no distress Abdomen:  normal findings: no organomegaly, soft, non-tender and no hernia  Pelvis:  External  genitalia: normal general appearance Urinary system: urethral meatus normal and bladder without fullness, nontender Vaginal: normal without tenderness, induration or masses Cervix: normal appearance Adnexa: normal bimanual exam Uterus: anteverted and non-tender, normal size    50% of 15 min visit spent on counseling and coordination of care.    Data Reviewed Wet Prep  Assessment     1. Acute vaginitis Rx: - Cervicovaginal ancillary only  2. BV (bacterial vaginosis) Rx: - tinidazole (TINDAMAX) 500 MG tablet; Take 2 tablets (1,000 mg total) by mouth daily with breakfast.  Dispense: 10 tablet; Refill: 2  3. Candida vaginitis Rx: - fluconazole (DIFLUCAN) 150 MG tablet; Take 1 tablet (150 mg total) by mouth once.  Dispense: 1 tablet; Refill: 0    Plan    Follow up in 2 months for Annual.  No orders of the defined types were placed in this encounter.  No orders of the defined types were placed in this encounter.

## 2017-09-21 NOTE — Patient Instructions (Addendum)
Bacterial Vaginosis Bacterial vaginosis is a vaginal infection that occurs when the normal balance of bacteria in the vagina is disrupted. It results from an overgrowth of certain bacteria. This is the most common vaginal infection among women ages 15-44. Because bacterial vaginosis increases your risk for STIs (sexually transmitted infections), getting treated can help reduce your risk for chlamydia, gonorrhea, herpes, and HIV (human immunodeficiency virus). Treatment is also important for preventing complications in pregnant women, because this condition can cause an early (premature) delivery. What are the causes? This condition is caused by an increase in harmful bacteria that are normally present in small amounts in the vagina. However, the reason that the condition develops is not fully understood. What increases the risk? The following factors may make you more likely to develop this condition:  Having a new sexual partner or multiple sexual partners.  Having unprotected sex.  Douching.  Having an intrauterine device (IUD).  Smoking.  Drug and alcohol abuse.  Taking certain antibiotic medicines.  Being pregnant.  You cannot get bacterial vaginosis from toilet seats, bedding, swimming pools, or contact with objects around you. What are the signs or symptoms? Symptoms of this condition include:  Grey or white vaginal discharge. The discharge can also be watery or foamy.  A fish-like odor with discharge, especially after sexual intercourse or during menstruation.  Itching in and around the vagina.  Burning or pain with urination.  Some women with bacterial vaginosis have no signs or symptoms. How is this diagnosed? This condition is diagnosed based on:  Your medical history.  A physical exam of the vagina.  Testing a sample of vaginal fluid under a microscope to look for a large amount of bad bacteria or abnormal cells. Your health care provider may use a cotton swab  or a small wooden spatula to collect the sample.  How is this treated? This condition is treated with antibiotics. These may be given as a pill, a vaginal cream, or a medicine that is put into the vagina (suppository). If the condition comes back after treatment, a second round of antibiotics may be needed. Follow these instructions at home: Medicines  Take over-the-counter and prescription medicines only as told by your health care provider.  Take or use your antibiotic as told by your health care provider. Do not stop taking or using the antibiotic even if you start to feel better. General instructions  If you have a female sexual partner, tell her that you have a vaginal infection. She should see her health care provider and be treated if she has symptoms. If you have a female sexual partner, he does not need treatment.  During treatment: ? Avoid sexual activity until you finish treatment. ? Do not douche. ? Avoid alcohol as directed by your health care provider. ? Avoid breastfeeding as directed by your health care provider.  Drink enough water and fluids to keep your urine clear or pale yellow.  Keep the area around your vagina and rectum clean. ? Wash the area daily with warm water. ? Wipe yourself from front to back after using the toilet.  Keep all follow-up visits as told by your health care provider. This is important. How is this prevented?  Do not douche.  Wash the outside of your vagina with warm water only.  Use protection when having sex. This includes latex condoms and dental dams.  Limit how many sexual partners you have. To help prevent bacterial vaginosis, it is best to have sex with just   one partner (monogamous).  Make sure you and your sexual partner are tested for STIs.  Wear cotton or cotton-lined underwear.  Avoid wearing tight pants and pantyhose, especially during summer.  Limit the amount of alcohol that you drink.  Do not use any products that  contain nicotine or tobacco, such as cigarettes and e-cigarettes. If you need help quitting, ask your health care provider.  Do not use illegal drugs. Where to find more information:  Centers for Disease Control and Prevention: www.cdc.gov/std  American Sexual Health Association (ASHA): www.ashastd.org  U.S. Department of Health and Human Services, Office on Women's Health: www.womenshealth.gov/ or https://www.womenshealth.gov/a-z-topics/bacterial-vaginosis Contact a health care provider if:  Your symptoms do not improve, even after treatment.  You have more discharge or pain when urinating.  You have a fever.  You have pain in your abdomen.  You have pain during sex.  You have vaginal bleeding between periods. Summary  Bacterial vaginosis is a vaginal infection that occurs when the normal balance of bacteria in the vagina is disrupted.  Because bacterial vaginosis increases your risk for STIs (sexually transmitted infections), getting treated can help reduce your risk for chlamydia, gonorrhea, herpes, and HIV (human immunodeficiency virus). Treatment is also important for preventing complications in pregnant women, because the condition can cause an early (premature) delivery.  This condition is treated with antibiotic medicines. These may be given as a pill, a vaginal cream, or a medicine that is put into the vagina (suppository). This information is not intended to replace advice given to you by your health care provider. Make sure you discuss any questions you have with your health care provider. Document Released: 11/16/2005 Document Revised: 08/01/2016 Document Reviewed: 08/01/2016 Elsevier Interactive Patient Education  2017 Elsevier Inc.  

## 2017-09-22 ENCOUNTER — Other Ambulatory Visit: Payer: Self-pay | Admitting: Obstetrics

## 2017-09-22 LAB — CERVICOVAGINAL ANCILLARY ONLY
BACTERIAL VAGINITIS: POSITIVE — AB
CANDIDA VAGINITIS: NEGATIVE
CHLAMYDIA, DNA PROBE: NEGATIVE
Neisseria Gonorrhea: NEGATIVE
TRICH (WINDOWPATH): POSITIVE — AB

## 2017-09-23 ENCOUNTER — Other Ambulatory Visit: Payer: Self-pay | Admitting: Obstetrics

## 2017-09-24 ENCOUNTER — Other Ambulatory Visit: Payer: Self-pay | Admitting: Obstetrics

## 2017-09-25 ENCOUNTER — Other Ambulatory Visit: Payer: Self-pay | Admitting: Obstetrics

## 2017-09-28 ENCOUNTER — Telehealth: Payer: Self-pay | Admitting: *Deleted

## 2017-09-28 ENCOUNTER — Other Ambulatory Visit: Payer: Self-pay | Admitting: Obstetrics

## 2017-09-28 DIAGNOSIS — N760 Acute vaginitis: Principal | ICD-10-CM

## 2017-09-28 DIAGNOSIS — B9689 Other specified bacterial agents as the cause of diseases classified elsewhere: Secondary | ICD-10-CM

## 2017-09-28 MED ORDER — TINIDAZOLE 500 MG PO TABS: 2.0000 g | ORAL_TABLET | Freq: Once | ORAL | 0 refills | Status: AC

## 2017-09-28 NOTE — Telephone Encounter (Signed)
Pt is requesting a call back, please advise.Marland KitchenMarland Kitchen

## 2017-09-28 NOTE — Telephone Encounter (Signed)
Pt called into office stating she had intercourse this past Saturday. She states she finished Tindamax for BV/Trich on Friday, partner has not been treated.  She is requesting new rx.  Pt also states she is currently taking Augmentin (from PCP) for sinus infection.  Rx to be sent to [facility] Please advise

## 2017-10-06 ENCOUNTER — Encounter: Payer: Self-pay | Admitting: Obstetrics

## 2017-10-12 ENCOUNTER — Ambulatory Visit: Payer: Medicaid Other | Admitting: Obstetrics and Gynecology

## 2017-10-13 ENCOUNTER — Ambulatory Visit: Payer: Medicaid Other | Admitting: Obstetrics

## 2017-10-25 ENCOUNTER — Other Ambulatory Visit (HOSPITAL_COMMUNITY)
Admission: RE | Admit: 2017-10-25 | Discharge: 2017-10-25 | Disposition: A | Discharge status: Home / Self Care | Payer: Medicaid Other | Source: Ambulatory Visit | Attending: Obstetrics and Gynecology | Admitting: Obstetrics and Gynecology

## 2017-10-25 ENCOUNTER — Encounter: Payer: Self-pay | Admitting: Obstetrics and Gynecology

## 2017-10-25 ENCOUNTER — Ambulatory Visit: Payer: Medicaid Other | Admitting: Obstetrics and Gynecology

## 2017-10-25 ENCOUNTER — Encounter: Payer: Self-pay | Admitting: *Deleted

## 2017-10-25 VITALS — BP 135/87 | HR 94 | Wt 224.0 lb

## 2017-10-25 DIAGNOSIS — Z23 Encounter for immunization: Secondary | ICD-10-CM

## 2017-10-25 DIAGNOSIS — Z202 Contact with and (suspected) exposure to infections with a predominantly sexual mode of transmission: Secondary | ICD-10-CM | POA: Diagnosis not present

## 2017-10-25 DIAGNOSIS — R51 Headache: Secondary | ICD-10-CM | POA: Diagnosis not present

## 2017-10-25 DIAGNOSIS — R519 Headache, unspecified: Secondary | ICD-10-CM | POA: Insufficient documentation

## 2017-10-25 MED ORDER — ESTRADIOL 0.025 MG/24HR TD PTTW: 1.0000 | MEDICATED_PATCH | TRANSDERMAL | 12 refills | Status: DC

## 2017-10-25 NOTE — Progress Notes (Signed)
Pt c/o: headaches w/ Birth Control pills.

## 2017-10-25 NOTE — Progress Notes (Signed)
Patient ID: DAFINA SUK, female   DOB: 16-Jan-1981, 36 y.o.   MRN: [id number] Ms Shinall presents today for [facility] fron tric noted on last OV.  She at that time had some bleeding associated with recent IUD insertion. She was started on OCP's x 1 month. Bleeding has now stopped. But since stopping the OCP's, she has daily frontal HA. No relief with OTC meds.  H/O BP. On medication and stable  She denies any visual changes, weakness or numbness  PE AF VSS Lungs clear Heart RRR Abd soft + BS  A/P TOC for Tric        Desires for Tdap        Headaches  Suspect HA are from estrogen withdraw. Discussed Tx options including restarting OCP's or estrogen patch. Pt desired estrogen patch. U/R/B reviewed with pt. Will reevaluate in 2 months. Pt instructed to call back if strength of estrogen patch was to low.

## 2017-10-26 LAB — CERVICOVAGINAL ANCILLARY ONLY: TRICH (WINDOWPATH): NEGATIVE

## 2017-11-01 ENCOUNTER — Encounter: Payer: Self-pay | Admitting: Obstetrics and Gynecology

## 2017-11-10 ENCOUNTER — Encounter: Payer: Self-pay | Admitting: Obstetrics and Gynecology

## 2017-11-24 ENCOUNTER — Other Ambulatory Visit (HOSPITAL_COMMUNITY)
Admission: RE | Admit: 2017-11-24 | Discharge: 2017-11-24 | Disposition: A | Discharge status: Home / Self Care | Payer: Medicaid Other | Source: Ambulatory Visit | Attending: Obstetrics | Admitting: Obstetrics

## 2017-11-24 ENCOUNTER — Telehealth: Payer: Self-pay | Admitting: Pediatrics

## 2017-11-24 ENCOUNTER — Ambulatory Visit: Payer: Medicaid Other | Admitting: Obstetrics

## 2017-11-24 VITALS — BP 124/87 | HR 74 | Wt 227.0 lb

## 2017-11-24 DIAGNOSIS — N898 Other specified noninflammatory disorders of vagina: Secondary | ICD-10-CM

## 2017-11-24 DIAGNOSIS — B3731 Acute candidiasis of vulva and vagina: Secondary | ICD-10-CM

## 2017-11-24 DIAGNOSIS — Z01419 Encounter for gynecological examination (general) (routine) without abnormal findings: Secondary | ICD-10-CM

## 2017-11-24 DIAGNOSIS — B373 Candidiasis of vulva and vagina: Secondary | ICD-10-CM

## 2017-11-24 DIAGNOSIS — N944 Primary dysmenorrhea: Secondary | ICD-10-CM

## 2017-11-24 DIAGNOSIS — Z0000 Encounter for general adult medical examination without abnormal findings: Secondary | ICD-10-CM | POA: Diagnosis not present

## 2017-11-24 DIAGNOSIS — N921 Excessive and frequent menstruation with irregular cycle: Secondary | ICD-10-CM

## 2017-11-24 DIAGNOSIS — Z975 Presence of (intrauterine) contraceptive device: Secondary | ICD-10-CM

## 2017-11-24 MED ORDER — IBUPROFEN 800 MG PO TABS: 800.0000 mg | ORAL_TABLET | Freq: Three times a day (TID) | ORAL | 5 refills | Status: DC | PRN

## 2017-11-24 MED ORDER — FLUCONAZOLE 150 MG PO TABS: 150.0000 mg | ORAL_TABLET | Freq: Once | ORAL | 0 refills | Status: DC

## 2017-11-24 NOTE — Patient Instructions (Addendum)
Dysfunctional Uterine Bleeding °Dysfunctional uterine bleeding is abnormal bleeding from the uterus. Dysfunctional uterine bleeding includes: °· A period that comes earlier or later than usual. °· A period that is lighter, heavier, or has blood clots. °· Bleeding between periods. °· Skipping one or more periods. °· Bleeding after sexual intercourse. °· Bleeding after menopause. ° °Follow these instructions at home: °Pay attention to any changes in your symptoms. Follow these instructions to help with your condition: °Eating and drinking °· Eat well-balanced meals. Include foods that are high in iron, such as liver, meat, shellfish, green leafy vegetables, and eggs. °· If you become constipated: °? Drink plenty of water. °? Eat fruits and vegetables that are high in water and fiber, such as spinach, carrots, raspberries, apples, and mango. °Medicines °· Take over-the-counter and prescription medicines only as told by your health care provider. °· Do not change medicines without talking with your health care provider. °· Aspirin or medicines that contain aspirin may make the bleeding worse. Do not take those medicines: °? During the week before your period. °? During your period. °· If you were prescribed iron pills, take them as told by your health care provider. Iron pills help to replace iron that your body loses because of this condition. °Activity °· If you need to change your sanitary pad or tampon more than one time every 2 hours: °? Lie in bed with your feet raised (elevated). °? Place a cold pack on your lower abdomen. °? Rest as much as possible until the bleeding stops or slows down. °· Do not try to lose weight until the bleeding has stopped and your blood iron level is back to normal. °Other Instructions °· For two months, write down: °? When your period starts. °? When your period ends. °? When any abnormal bleeding occurs. °? What problems you notice. °· Keep all follow up visits as told by your health  care provider. This is important. °Contact a health care provider if: °· You get light-headed or weak. °· You have nausea and vomiting. °· You cannot eat or drink without vomiting. °· You feel dizzy or have diarrhea while you are taking medicines. °· You are taking birth control pills or hormones, and you want to change them or stop taking them. °Get help right away if: °· You develop a fever or chills. °· You need to change your sanitary pad or tampon more than one time per hour. °· Your bleeding becomes heavier, or your flow contains clots more often. °· You develop pain in your abdomen. °· You lose consciousness. °· You develop a rash. °This information is not intended to replace advice given to you by your health care provider. Make sure you discuss any questions you have with your health care provider. °Document Released: 11/13/2000 Document Revised: 04/23/2016 Document Reviewed: 02/11/2015 °Elsevier Interactive Patient Education © 2018 Elsevier Inc. ° °

## 2017-11-24 NOTE — Telephone Encounter (Signed)
Pt reports you were going to send rx for Provera to pharmacy for her. She states you all spoke about it at today's office visit.  She states the fluconazole and ibuprofen are at the pharmacy.  Please advise.

## 2017-11-25 LAB — CERVICOVAGINAL ANCILLARY ONLY
Bacterial vaginitis: POSITIVE — AB
CANDIDA VAGINITIS: NEGATIVE
Chlamydia: NEGATIVE
NEISSERIA GONORRHEA: NEGATIVE
TRICH (WINDOWPATH): NEGATIVE

## 2017-11-25 MED ORDER — MEDROXYPROGESTERONE ACETATE 10 MG PO TABS: 10.0000 mg | ORAL_TABLET | Freq: Every day | ORAL | 0 refills | Status: DC

## 2017-11-25 NOTE — Telephone Encounter (Signed)
I spoke to patient and advised of rx being sent. She voiced understanding and thanks.

## 2017-11-25 NOTE — Telephone Encounter (Signed)
Provera Rx

## 2017-11-26 ENCOUNTER — Encounter: Payer: Self-pay | Admitting: Obstetrics

## 2017-11-26 ENCOUNTER — Telehealth: Payer: Self-pay

## 2017-11-26 ENCOUNTER — Other Ambulatory Visit: Payer: Self-pay | Admitting: Obstetrics

## 2017-11-26 DIAGNOSIS — N760 Acute vaginitis: Principal | ICD-10-CM

## 2017-11-26 DIAGNOSIS — B9689 Other specified bacterial agents as the cause of diseases classified elsewhere: Secondary | ICD-10-CM

## 2017-11-26 LAB — CYTOLOGY - PAP
DIAGNOSIS: NEGATIVE
HPV: NOT DETECTED

## 2017-11-26 MED ORDER — SECNIDAZOLE 2 G PO PACK: 1.0000 | PACK | Freq: Once | ORAL | 2 refills | Status: AC

## 2017-11-26 NOTE — Telephone Encounter (Signed)
Solosec 2 GM Approved @ 4:27pm  11/26/17 PA # [id number] Pt made aware.  [facility] CMA

## 2017-11-26 NOTE — Progress Notes (Addendum)
Subjective:        Lauren Garza is a 36 y.o. female here for a routine exam.  Current complaints: Irregular vaginal bleeding.    Personal health questionnaire:  Is patient Ashkenazi Jewish, have a family history of breast and/or ovarian cancer: no Is there a family history of uterine cancer diagnosed at age < 2, gastrointestinal cancer, urinary tract cancer, family member who is a Field seismologist syndrome-associated carrier: no Is the patient overweight and hypertensive, family history of diabetes, personal history of gestational diabetes, preeclampsia or PCOS: no Is patient over 51, have PCOS,  family history of premature CHD under age 24, diabetes, smoke, have hypertension or peripheral artery disease:  no At any time, has a partner hit, kicked or otherwise hurt or frightened you?: no Over the past 2 weeks, have you felt down, depressed or hopeless?: no Over the past 2 weeks, have you felt little interest or pleasure in doing things?:no   Gynecologic History Patient's last menstrual period was 10/30/2017. Contraception: IUD Last Pap: 2014. Results were: normal Last mammogram: n/a. Results were: n/a  Obstetric History OB History  Gravida Para Term Preterm AB Living  3 3 3  0 0 3  SAB TAB Ectopic Multiple Live Births  0 0 0 0      # Outcome Date GA Lbr Len/2nd Weight Sex Delivery Anes PTL Lv  3 Term      Vag-Spont     2 Term      Vag-Spont     1 Term      Vag-Spont         Past Medical History:  Diagnosis Date  . Abnormal Pap smear   . Acid reflux   . Asthma   . Bacterial vaginosis   . Chlamydia trachomatis infection 2012  . DUB (dysfunctional uterine bleeding)   . Family history of adverse reaction to anesthesia    mom has n and V past surgery  . Frequent headaches   . Hypertension     Past Surgical History:  Procedure Laterality Date  . GYNECOLOGIC CRYOSURGERY    . I&D EXTREMITY Right 04/04/2015   Procedure: IRRIGATION AND DEBRIDEMENT RIGHT WRIST;  Surgeon: Leanora Cover, MD;  Location: [place];  Service: Orthopedics;  Laterality: Right;  . SINUS ENDO WITH FUSION N/A 01/25/2017   Procedure: SINUS ENDO WITH FUSION;  Surgeon: Melida Quitter, MD;  Location: [place];  Service: ENT;  Laterality: N/A;  . TUBAL LIGATION    . WISDOM TOOTH EXTRACTION    . WRIST SURGERY       Current Outpatient Medications:  .  budesonide-formoterol (SYMBICORT) 160-4.5 MCG/ACT inhaler, Inhale 2 puffs into the lungs 2 (two) times daily., Disp: , Rfl:  .  cetirizine (ZYRTEC) 10 MG tablet, , Disp: , Rfl: 0 .  montelukast (SINGULAIR) 10 MG tablet, , Disp: , Rfl: 0 .  Albuterol Sulfate (PROAIR HFA IN), Inhale into the lungs as needed., Disp: , Rfl:  .  EPINEPHrine 0.3 mg/0.3 mL IJ SOAJ injection, Inject into the muscle as needed., Disp: , Rfl:  .  estradiol (VIVELLE-DOT) 0.025 MG/24HR, Place 1 patch onto the skin 2 (two) times a week. (Patient not taking: Reported on 11/24/2017), Disp: 8 patch, Rfl: 12 .  ibuprofen (ADVIL,MOTRIN) 600 MG tablet, Take 1 tablet (600 mg total) by mouth every 6 (six) hours as needed. (Patient not taking: Reported on 11/24/2017), Disp: 30 tablet, Rfl: 0 .  ibuprofen (ADVIL,MOTRIN) 800 MG tablet, Take 1 tablet (800  mg total) by mouth every 8 (eight) hours as needed., Disp: 30 tablet, Rfl: 5 .  medroxyPROGESTERone (PROVERA) 10 MG tablet, Take 1 tablet (10 mg total) by mouth daily., Disp: 30 tablet, Rfl: 0 .  phentermine (ADIPEX-P) 37.5 MG tablet, Take 37.5 mg by mouth daily., Disp: , Rfl: 0 .  Secnidazole (SOLOSEC) 2 g PACK, Take 1 packet by mouth once for 1 dose. Mix as directed with yogurt, pudding, applesauce, etc., Disp: 1 each, Rfl: 2 .  triamterene-hydrochlorothiazide (DYAZIDE) 37.5-25 MG capsule, Take 1 capsule by mouth daily., Disp: , Rfl:  Allergies  Allergen Reactions  . Clindamycin/Lincomycin Hives  . Flagyl [Metronidazole Hcl] Hives  . Lincomycin Hcl Hives    Social History   Tobacco Use  . Smoking status: Former Research scientist (life sciences)  .  Smokeless tobacco: Never Used  Substance Use Topics  . Alcohol use: Yes    Comment: Rare, 1x year    Family History  Problem Relation Age of Onset  . Heart attack Father   . Hypertension Mother   . Diabetes Maternal Grandmother       Review of Systems  Constitutional: negative for fatigue and weight loss Respiratory: negative for cough and wheezing Cardiovascular: negative for chest pain, fatigue and palpitations Gastrointestinal: negative for abdominal pain and change in bowel habits Musculoskeletal:negative for myalgias Neurological: negative for gait problems and tremors Behavioral/Psych: negative for abusive relationship, depression Endocrine: negative for temperature intolerance    Genitourinary:positive for abnormal menstrual periods Integument/breast: negative for breast lump, breast tenderness, nipple discharge and skin lesion(s)    Objective:       BP 124/87   Pulse 74   Wt 227 lb (103 kg)   LMP 10/30/2017   BMI 41.52 kg/m  General:   alert  Skin:   no rash or abnormalities  Lungs:   clear to auscultation bilaterally  Heart:   regular rate and rhythm, S1, S2 normal, no murmur, click, rub or gallop  Breasts:   normal without suspicious masses, skin or nipple changes or axillary nodes  Abdomen:  normal findings: no organomegaly, soft, non-tender and no hernia  Pelvis:  External genitalia: normal general appearance Urinary system: urethral meatus normal and bladder without fullness, nontender Vaginal: normal without tenderness, induration or masses Cervix: normal appearance.  IUD string visible Adnexa: normal bimanual exam Uterus: anteverted and non-tender, normal size   Lab Review Urine pregnancy test Labs reviewed yes Radiologic studies reviewed no  50% of 20 min visit spent on counseling and coordination of care.   Assessment:     1. Encounter for routine gynecological examination with Papanicolaou smear of cervix Rx: - Cytology - PAP - HIV  antibody - Hepatitis B surface antigen - RPR - Hepatitis C antibody  2. Breakthrough bleeding with IUD Rx: - US PELVIC COMPLETE WITH TRANSVAGINAL; Future - medroxyPROGESTERone (PROVERA) 10 MG tablet; Take 1 tablet (10 mg total) by mouth daily.  Dispense: 30 tablet; Refill: 0  3. Candida vaginitis Rx: - fluconazole (DIFLUCAN) 150 MG tablet; Take 1 tablet (150 mg total) by mouth once for 1 dose.  Dispense: 1 tablet; Refill: 0  4. Vaginal discharge Rx: - Cervicovaginal ancillary only  5. Primary dysmenorrhea Rx: - ibuprofen (ADVIL,MOTRIN) 800 MG tablet; Take 1 tablet (800 mg total) by mouth every 8 (eight) hours as needed.  Dispense: 30 tablet; Refill: 5   Plan:    Education reviewed: calcium supplements, depression evaluation, low fat, low cholesterol diet, safe sex/STD prevention, self breast exams and weight bearing exercise.  Contraception: IUD. Follow up in: 1 year.   Meds ordered this encounter  Medications  . fluconazole (DIFLUCAN) 150 MG tablet    Sig: Take 1 tablet (150 mg total) by mouth once for 1 dose.    Dispense:  1 tablet    Refill:  0  . ibuprofen (ADVIL,MOTRIN) 800 MG tablet    Sig: Take 1 tablet (800 mg total) by mouth every 8 (eight) hours as needed.    Dispense:  30 tablet    Refill:  5  . medroxyPROGESTERone (PROVERA) 10 MG tablet    Sig: Take 1 tablet (10 mg total) by mouth daily.    Dispense:  30 tablet    Refill:  0   Orders Placed This Encounter  Procedures  . US PELVIC COMPLETE WITH TRANSVAGINAL    Standing Status:   Future    Standing Expiration Date:   01/25/2019    Order Specific Question:   Reason for Exam (SYMPTOM  OR DIAGNOSIS REQUIRED)    Answer:   AUB.  IUD Placement.    Order Specific Question:   Preferred imaging location?    Answer:   [facility]  . HIV antibody  . Hepatitis B surface antigen  . RPR  . Hepatitis C antibody

## 2017-12-01 ENCOUNTER — Ambulatory Visit (HOSPITAL_COMMUNITY)
Admission: RE | Admit: 2017-12-01 | Discharge: 2017-12-01 | Disposition: A | Discharge status: Home / Self Care | Payer: Medicaid Other | Source: Ambulatory Visit | Attending: Obstetrics | Admitting: Obstetrics

## 2017-12-01 DIAGNOSIS — Z975 Presence of (intrauterine) contraceptive device: Secondary | ICD-10-CM | POA: Diagnosis not present

## 2017-12-01 DIAGNOSIS — N921 Excessive and frequent menstruation with irregular cycle: Secondary | ICD-10-CM

## 2017-12-01 DIAGNOSIS — N939 Abnormal uterine and vaginal bleeding, unspecified: Secondary | ICD-10-CM | POA: Diagnosis present

## 2017-12-01 DIAGNOSIS — D252 Subserosal leiomyoma of uterus: Secondary | ICD-10-CM | POA: Insufficient documentation

## 2017-12-02 ENCOUNTER — Other Ambulatory Visit: Payer: Self-pay | Admitting: Obstetrics

## 2017-12-15 ENCOUNTER — Other Ambulatory Visit: Payer: Self-pay | Admitting: Obstetrics

## 2017-12-15 DIAGNOSIS — B373 Candidiasis of vulva and vagina: Secondary | ICD-10-CM

## 2017-12-15 DIAGNOSIS — B3731 Acute candidiasis of vulva and vagina: Secondary | ICD-10-CM

## 2017-12-21 ENCOUNTER — Encounter: Payer: Self-pay | Admitting: *Deleted

## 2017-12-21 ENCOUNTER — Encounter: Payer: Self-pay | Admitting: Obstetrics

## 2017-12-21 ENCOUNTER — Ambulatory Visit: Payer: Medicaid Other | Admitting: Obstetrics

## 2017-12-21 ENCOUNTER — Other Ambulatory Visit: Payer: Self-pay

## 2017-12-21 VITALS — BP 117/75 | HR 78 | Ht 62.0 in | Wt 221.0 lb

## 2017-12-21 DIAGNOSIS — N921 Excessive and frequent menstruation with irregular cycle: Secondary | ICD-10-CM

## 2017-12-21 DIAGNOSIS — Z975 Presence of (intrauterine) contraceptive device: Secondary | ICD-10-CM

## 2017-12-21 DIAGNOSIS — Z30432 Encounter for removal of intrauterine contraceptive device: Secondary | ICD-10-CM

## 2017-12-21 NOTE — Progress Notes (Signed)
    GYNECOLOGY OFFICE PROCEDURE NOTE  Lauren Garza is a 37 y.o. [phone] here for Mirena IUD removal. No GYN concerns.  Last pap smear was on November 24, 2017 and was normal.  IUD Removal  Patient identified, informed consent performed, consent signed.  Patient was in the dorsal lithotomy position, normal external genitalia was noted.  A speculum was placed in the patient's vagina, normal discharge was noted, no lesions. The cervix was visualized, no lesions, no abnormal discharge.  The strings of the IUD were grasped and pulled using ring forceps. The IUD was removed in its entirety.   Patient tolerated the procedure well.    Patient wants surgical consultation for possible hysterectomy.  Routine preventative health maintenance measures emphasized.   Shelly Bombard, MD, [place] for [employer], [place]

## 2017-12-21 NOTE — Progress Notes (Signed)
Presents for the removal of misplaced IUD (Mirena).

## 2017-12-21 NOTE — Patient Instructions (Signed)
Vaginal Hysterectomy A vaginal hysterectomy is a procedure to remove all or part of the uterus through a small incision in the vagina. In this procedure, your health care provider may remove your entire uterus, including the lower end (cervix). You may need a vaginal hysterectomy to treat:  Uterine fibroids.  A condition that causes the lining of the uterus to grow in other areas (endometriosis).  Problems with pelvic support.  Cancer of the cervix, ovaries, uterus, or tissue that lines the uterus (endometrium).  Excessive (dysfunctional) uterine bleeding.  When removing your uterus, your health care provider may also remove the organs that produce eggs (ovaries) and the tubes that carry eggs to your uterus (fallopian tubes). After a vaginal hysterectomy, you will no longer be able to have a baby. You will also no longer get your menstrual period. Tell a health care provider about:  Any allergies you have.  All medicines you are taking, including vitamins, herbs, eye drops, creams, and over-the-counter medicines.  Any problems you or family members have had with anesthetic medicines.  Any blood disorders you have.  Any surgeries you have had.  Any medical conditions you have.  Whether you are pregnant or may be pregnant. What are the risks? Generally, this is a safe procedure. However, problems may occur, including:  Bleeding.  Infection.  A blood clot that forms in your leg and travels to your lungs (pulmonary embolism).  Damage to surrounding organs.  Pain during sex.  What happens before the procedure?  Ask your health care provider what organs will be removed during surgery.  Ask your health care provider about: ? Changing or stopping your regular medicines. This is especially important if you are taking diabetes medicines or blood thinners. ? Taking medicines such as aspirin and ibuprofen. These medicines can thin your blood. Do not take these medicines before  your procedure if your health care provider instructs you not to.  Follow instructions from your health care provider about eating or drinking restrictions.  Do not use any tobacco products, such as cigarettes, chewing tobacco, and e-cigarettes. If you need help quitting, ask your health care provider.  Plan to have someone take you home after discharge from the hospital. What happens during the procedure?  To reduce your risk of infection: ? Your health care team will wash or sanitize their hands. ? Your skin will be washed with soap.  An IV tube will be inserted into one of your veins.  You may be given antibiotic medicine to help prevent infection.  You will be given one or more of the following: ? A medicine to help you relax (sedative). ? A medicine to numb the area (local anesthetic). ? A medicine to make you fall asleep (general anesthetic). ? A medicine that is injected into an area of your body to numb everything beyond the injection site (regional anesthetic).  Your surgeon will make an incision in your vagina.  Your surgeon will locate and remove all or part of your uterus.  Your ovaries and fallopian tubes may be removed at the same time.  The incision will be closed with stitches (sutures) that dissolve over time. The procedure may vary among health care providers and hospitals. What happens after the procedure?  Your blood pressure, heart rate, breathing rate, and blood oxygen level will be monitored often until the medicines you were given have worn off.  You will be encouraged to get up and walk around after a few hours to help prevent   complications.  You may have IV tubes in place for a few days.  You will be given pain medicine as needed.  Do not drive for 24 hours if you were given a sedative. This information is not intended to replace advice given to you by your health care provider. Make sure you discuss any questions you have with your health care  provider. Document Released: 03/09/2016 Document Revised: 04/23/2016 Document Reviewed: 12/01/2015 Elsevier Interactive Patient Education  2018 [employer]  Vaginal Hysterectomy, Care After Refer to this sheet in the next few weeks. These instructions provide you with information about caring for yourself after your procedure. Your health care provider may also give you more specific instructions. Your treatment has been planned according to current medical practices, but problems sometimes occur. Call your health care provider if you have any problems or questions after your procedure. What can I expect after the procedure? After the procedure, it is common to have:  Pain.  Soreness and numbness in your incision areas.  Vaginal bleeding and discharge.  Constipation.  Temporary problems emptying the bladder.  Feelings of sadness or other emotions.  Follow these instructions at home: Medicines  Take over-the-counter and prescription medicines only as told by your health care provider.  If you were prescribed an antibiotic medicine, take it as told by your health care provider. Do not stop taking the antibiotic even if you start to feel better.  Do not drive or operate heavy machinery while taking prescription pain medicine. Activity  Return to your normal activities as told by your health care provider. Ask your health care provider what activities are safe for you.  Get regular exercise as told by your health care provider. You may be told to take short walks every day and go farther each time.  Do not lift anything that is heavier than 10 lb (4.5 kg). General instructions   Do not put anything in your vagina for 6 weeks after your surgery or as told by your health care provider. This includes tampons and douches.  Do not have sex until your health care provider says you can.  Do not take baths, swim, or use a hot tub until your health care provider approves.  Drink  enough fluid to keep your urine clear or pale yellow.  Do not drive for 24 hours if you were given a sedative.  Keep all follow-up visits as told by your health care provider. This is important. Contact a health care provider if:  Your pain medicine is not helping.  You have a fever.  You have redness, swelling, or pain at your incision site.  You have blood, pus, or a bad-smelling discharge from your vagina.  You continue to have difficulty urinating. Get help right away if:  You have severe abdominal or back pain.  You have heavy bleeding from your vagina.  You have chest pain or shortness of breath. This information is not intended to replace advice given to you by your health care provider. Make sure you discuss any questions you have with your health care provider. Document Released: 03/09/2016 Document Revised: 04/23/2016 Document Reviewed: 12/01/2015 Elsevier Interactive Patient Education  2018 [employer] Inc.  

## 2017-12-22 ENCOUNTER — Ambulatory Visit (INDEPENDENT_AMBULATORY_CARE_PROVIDER_SITE_OTHER): Payer: Medicaid Other | Admitting: Obstetrics and Gynecology

## 2017-12-22 ENCOUNTER — Encounter: Payer: Self-pay | Admitting: Obstetrics and Gynecology

## 2017-12-22 VITALS — BP 120/83 | HR 90 | Wt 225.0 lb

## 2017-12-22 DIAGNOSIS — N938 Other specified abnormal uterine and vaginal bleeding: Secondary | ICD-10-CM | POA: Insufficient documentation

## 2017-12-22 DIAGNOSIS — D219 Benign neoplasm of connective and other soft tissue, unspecified: Secondary | ICD-10-CM | POA: Diagnosis not present

## 2017-12-22 NOTE — Progress Notes (Signed)
Patient ID: Lauren Garza, female   DOB: August 12, 1981, 37 y.o.   MRN: [id number] [name] presents in referral from Dr. Jodi Mourning to discuss hysterectomy d/t uterine fibroids and DUB. Pt has a long standing history of DUB. She has tried various OCP's without relief.  She last tried Mirena IUD but continued to have heavy irregular bleeding and malposition of the IUD requiring removal.  U/S 1/19 demonstrated a 4 x 4 x 4 cm subserosal fibroid.  She is s/p BTL for contraception and has completed her desire for children  She desires definite therapy.   PE AF VSS Lungs clear Heart RRR Abd soft + BS GU nl EGBUS uterus small < 10 week size mobile non tender no adnexal masses   A/P DUB        Uterine fibroids  Tx options reviewed with pt. Including additional attempts at medication. Ablation and hysterectomy. Following discussion pt desires hysterectomy. R/B/Post op care reviewed with pt. Pt will need to complete financial aid package. Will schedule once completed. F/U with post op visit

## 2017-12-22 NOTE — Patient Instructions (Signed)
Vaginal Hysterectomy A vaginal hysterectomy is a procedure to remove all or part of the uterus through a small incision in the vagina. In this procedure, your health care provider may remove your entire uterus, including the lower end (cervix). You may need a vaginal hysterectomy to treat:  Uterine fibroids.  A condition that causes the lining of the uterus to grow in other areas (endometriosis).  Problems with pelvic support.  Cancer of the cervix, ovaries, uterus, or tissue that lines the uterus (endometrium).  Excessive (dysfunctional) uterine bleeding.  When removing your uterus, your health care provider may also remove the organs that produce eggs (ovaries) and the tubes that carry eggs to your uterus (fallopian tubes). After a vaginal hysterectomy, you will no longer be able to have a baby. You will also no longer get your menstrual period. Tell a health care provider about:  Any allergies you have.  All medicines you are taking, including vitamins, herbs, eye drops, creams, and over-the-counter medicines.  Any problems you or family members have had with anesthetic medicines.  Any blood disorders you have.  Any surgeries you have had.  Any medical conditions you have.  Whether you are pregnant or may be pregnant. What are the risks? Generally, this is a safe procedure. However, problems may occur, including:  Bleeding.  Infection.  A blood clot that forms in your leg and travels to your lungs (pulmonary embolism).  Damage to surrounding organs.  Pain during sex.  What happens before the procedure?  Ask your health care provider what organs will be removed during surgery.  Ask your health care provider about: ? Changing or stopping your regular medicines. This is especially important if you are taking diabetes medicines or blood thinners. ? Taking medicines such as aspirin and ibuprofen. These medicines can thin your blood. Do not take these medicines before  your procedure if your health care provider instructs you not to.  Follow instructions from your health care provider about eating or drinking restrictions.  Do not use any tobacco products, such as cigarettes, chewing tobacco, and e-cigarettes. If you need help quitting, ask your health care provider.  Plan to have someone take you home after discharge from the hospital. What happens during the procedure?  To reduce your risk of infection: ? Your health care team will wash or sanitize their hands. ? Your skin will be washed with soap.  An IV tube will be inserted into one of your veins.  You may be given antibiotic medicine to help prevent infection.  You will be given one or more of the following: ? A medicine to help you relax (sedative). ? A medicine to numb the area (local anesthetic). ? A medicine to make you fall asleep (general anesthetic). ? A medicine that is injected into an area of your body to numb everything beyond the injection site (regional anesthetic).  Your surgeon will make an incision in your vagina.  Your surgeon will locate and remove all or part of your uterus.  Your ovaries and fallopian tubes may be removed at the same time.  The incision will be closed with stitches (sutures) that dissolve over time. The procedure may vary among health care providers and hospitals. What happens after the procedure?  Your blood pressure, heart rate, breathing rate, and blood oxygen level will be monitored often until the medicines you were given have worn off.  You will be encouraged to get up and walk around after a few hours to help prevent   complications.  You may have IV tubes in place for a few days.  You will be given pain medicine as needed.  Do not drive for 24 hours if you were given a sedative. This information is not intended to replace advice given to you by your health care provider. Make sure you discuss any questions you have with your health care  provider. Document Released: 03/09/2016 Document Revised: 04/23/2016 Document Reviewed: 12/01/2015 Elsevier Interactive Patient Education  2018 [employer] Inc.  

## 2017-12-22 NOTE — Progress Notes (Signed)
Pt is in the office for surgery consult. Pt reports vaginal bleeding twice a month that will last about 15-20 days.

## 2017-12-23 ENCOUNTER — Encounter (HOSPITAL_COMMUNITY): Payer: Self-pay

## 2018-01-17 ENCOUNTER — Other Ambulatory Visit: Payer: Self-pay | Admitting: Obstetrics

## 2018-01-17 DIAGNOSIS — B3731 Acute candidiasis of vulva and vagina: Secondary | ICD-10-CM

## 2018-01-17 DIAGNOSIS — B373 Candidiasis of vulva and vagina: Secondary | ICD-10-CM

## 2018-01-17 MED ORDER — FLUCONAZOLE 150 MG PO TABS: 1.0000 mg | ORAL_TABLET | Freq: Once | ORAL | 0 refills | Status: DC

## 2018-01-17 NOTE — Patient Instructions (Addendum)
Your procedure is scheduled on: Tuesday, February 01, 2018 at 3:15  Enter through the Main Entrance of [facility] at: 1:45 pm  Pick up the phone at the desk and dial [phone].  Call this number if you have problems the morning of surgery: [phone].  Remember: Do NOT eat food after midnight Monday.  Do NOT drink clear liquids (including water) after: 9 am Tuesday, day of surgery.  Take these medicines the morning of surgery with a SIP OF WATER:  Ok to use symbicort inhaler.    Bring albuterol inhaler with you on day of surgery.  Do Not smoke on the day of surgery.  Stop herbal medications and supplements at this time.  Do NOT wear jewelry (body piercing), metal hair clips/bobby pins, make-up, or nail polish. Do NOT wear lotions, powders, or perfumes.  You may wear deoderant. Do NOT shave for 48 hours prior to surgery. Do NOT bring valuables to the hospital. Contacts, dentures, or bridgework may not be worn into surgery.  Leave suitcase in car.  After surgery it may be brought to your room.   For patients admitted to the hospital, checkout time is 11:00 AM the day of discharge.

## 2018-01-26 ENCOUNTER — Other Ambulatory Visit: Payer: Self-pay

## 2018-01-26 ENCOUNTER — Encounter (HOSPITAL_COMMUNITY)
Admission: RE | Admit: 2018-01-26 | Discharge: 2018-01-26 | Disposition: A | Discharge status: Home / Self Care | Payer: Medicaid Other | Source: Ambulatory Visit | Attending: Obstetrics and Gynecology | Admitting: Obstetrics and Gynecology

## 2018-01-26 ENCOUNTER — Encounter (HOSPITAL_COMMUNITY): Payer: Self-pay

## 2018-01-26 DIAGNOSIS — Z01818 Encounter for other preprocedural examination: Secondary | ICD-10-CM | POA: Insufficient documentation

## 2018-01-26 DIAGNOSIS — R9439 Abnormal result of other cardiovascular function study: Secondary | ICD-10-CM | POA: Insufficient documentation

## 2018-01-26 DIAGNOSIS — I499 Cardiac arrhythmia, unspecified: Secondary | ICD-10-CM | POA: Insufficient documentation

## 2018-01-26 LAB — CBC
HCT: 41.1 % (ref 36.0–46.0)
Hemoglobin: 13.1 g/dL (ref 12.0–15.0)
MCH: 26.3 pg (ref 26.0–34.0)
MCHC: 31.9 g/dL (ref 30.0–36.0)
MCV: 82.4 fL (ref 78.0–100.0)
Platelets: 433 10*3/uL — ABNORMAL HIGH (ref 150–400)
RBC: 4.99 MIL/uL (ref 3.87–5.11)
RDW: 15.6 % — AB (ref 11.5–15.5)
WBC: 14.4 10*3/uL — AB (ref 4.0–10.5)

## 2018-01-26 LAB — BASIC METABOLIC PANEL
Anion gap: 8 (ref 5–15)
BUN: 7 mg/dL (ref 6–20)
CO2: 24 mmol/L (ref 22–32)
Calcium: 9.2 mg/dL (ref 8.9–10.3)
Chloride: 100 mmol/L — ABNORMAL LOW (ref 101–111)
Creatinine, Ser: 0.71 mg/dL (ref 0.44–1.00)
Glucose, Bld: 130 mg/dL — ABNORMAL HIGH (ref 65–99)
POTASSIUM: 3.3 mmol/L — AB (ref 3.5–5.1)
SODIUM: 132 mmol/L — AB (ref 135–145)

## 2018-02-01 ENCOUNTER — Ambulatory Visit (HOSPITAL_COMMUNITY): Payer: BLUE CROSS/BLUE SHIELD | Admitting: Certified Registered Nurse Anesthetist

## 2018-02-01 ENCOUNTER — Other Ambulatory Visit: Payer: Self-pay

## 2018-02-01 ENCOUNTER — Encounter (HOSPITAL_COMMUNITY)
Admission: AD | Disposition: A | Discharge status: Home / Self Care | Payer: Self-pay | Source: Ambulatory Visit | Attending: Obstetrics and Gynecology

## 2018-02-01 ENCOUNTER — Encounter (HOSPITAL_COMMUNITY): Payer: Self-pay | Admitting: Emergency Medicine

## 2018-02-01 ENCOUNTER — Observation Stay (HOSPITAL_COMMUNITY)
Admission: AD | Admit: 2018-02-01 | Discharge: 2018-02-02 | Disposition: A | Discharge status: Home / Self Care | Payer: BLUE CROSS/BLUE SHIELD | Source: Ambulatory Visit | Attending: Obstetrics and Gynecology | Admitting: Obstetrics and Gynecology

## 2018-02-01 DIAGNOSIS — I10 Essential (primary) hypertension: Secondary | ICD-10-CM | POA: Diagnosis not present

## 2018-02-01 DIAGNOSIS — Z87891 Personal history of nicotine dependence: Secondary | ICD-10-CM | POA: Diagnosis not present

## 2018-02-01 DIAGNOSIS — D259 Leiomyoma of uterus, unspecified: Secondary | ICD-10-CM | POA: Diagnosis not present

## 2018-02-01 DIAGNOSIS — N920 Excessive and frequent menstruation with regular cycle: Secondary | ICD-10-CM | POA: Insufficient documentation

## 2018-02-01 DIAGNOSIS — K219 Gastro-esophageal reflux disease without esophagitis: Secondary | ICD-10-CM | POA: Diagnosis not present

## 2018-02-01 DIAGNOSIS — Z881 Allergy status to other antibiotic agents status: Secondary | ICD-10-CM | POA: Insufficient documentation

## 2018-02-01 DIAGNOSIS — N800 Endometriosis of uterus: Secondary | ICD-10-CM | POA: Diagnosis not present

## 2018-02-01 DIAGNOSIS — Z79899 Other long term (current) drug therapy: Secondary | ICD-10-CM | POA: Diagnosis not present

## 2018-02-01 DIAGNOSIS — Z90710 Acquired absence of both cervix and uterus: Secondary | ICD-10-CM

## 2018-02-01 DIAGNOSIS — N938 Other specified abnormal uterine and vaginal bleeding: Secondary | ICD-10-CM | POA: Diagnosis not present

## 2018-02-01 DIAGNOSIS — J45909 Unspecified asthma, uncomplicated: Secondary | ICD-10-CM | POA: Insufficient documentation

## 2018-02-01 HISTORY — PX: VAGINAL HYSTERECTOMY: SHX2639

## 2018-02-01 LAB — PREGNANCY, URINE: PREG TEST UR: NEGATIVE

## 2018-02-01 SURGERY — HYSTERECTOMY, VAGINAL
Anesthesia: General | Site: Vagina | Laterality: Bilateral

## 2018-02-01 MED ORDER — CEFAZOLIN SODIUM-DEXTROSE 2-4 GM/100ML-% IV SOLN
INTRAVENOUS | Status: AC
  Filled 2018-02-01: qty 100

## 2018-02-01 MED ORDER — HYDROMORPHONE HCL 1 MG/ML IJ SOLN
INTRAMUSCULAR | Status: AC
  Filled 2018-02-01: qty 1

## 2018-02-01 MED ORDER — TRIAMTERENE-HCTZ 37.5-25 MG PO TABS
1.0000 | ORAL_TABLET | Freq: Every day | ORAL | Status: DC
  Administered 2018-02-02: 1 via ORAL
  Filled 2018-02-01 (×2): qty 1

## 2018-02-01 MED ORDER — ONDANSETRON HCL 4 MG PO TABS: 4.0000 mg | ORAL_TABLET | Freq: Four times a day (QID) | ORAL | Status: DC | PRN

## 2018-02-01 MED ORDER — FENTANYL CITRATE (PF) 100 MCG/2ML IJ SOLN
25.0000 ug | INTRAMUSCULAR | Status: DC | PRN
  Administered 2018-02-01 (×3): 50 ug via INTRAVENOUS

## 2018-02-01 MED ORDER — ALBUTEROL SULFATE (2.5 MG/3ML) 0.083% IN NEBU: 3.0000 mL | INHALATION_SOLUTION | RESPIRATORY_TRACT | Status: DC | PRN

## 2018-02-01 MED ORDER — ONDANSETRON HCL 4 MG/2ML IJ SOLN
INTRAMUSCULAR | Status: AC
  Filled 2018-02-01: qty 2

## 2018-02-01 MED ORDER — ROCURONIUM BROMIDE 100 MG/10ML IV SOLN
INTRAVENOUS | Status: AC
  Filled 2018-02-01: qty 1

## 2018-02-01 MED ORDER — LIDOCAINE HCL (CARDIAC) 20 MG/ML IV SOLN
INTRAVENOUS | Status: DC | PRN
  Administered 2018-02-01: 60 mg via INTRAVENOUS

## 2018-02-01 MED ORDER — MONTELUKAST SODIUM 10 MG PO TABS
10.0000 mg | ORAL_TABLET | Freq: Every day | ORAL | Status: DC
  Administered 2018-02-02: 10 mg via ORAL
  Filled 2018-02-01 (×2): qty 1

## 2018-02-01 MED ORDER — FENTANYL CITRATE (PF) 250 MCG/5ML IJ SOLN
INTRAMUSCULAR | Status: DC | PRN
  Administered 2018-02-01: 100 ug via INTRAVENOUS
  Administered 2018-02-01 (×3): 50 ug via INTRAVENOUS

## 2018-02-01 MED ORDER — KETOROLAC TROMETHAMINE 30 MG/ML IJ SOLN
INTRAMUSCULAR | Status: AC
  Filled 2018-02-01: qty 1

## 2018-02-01 MED ORDER — SCOPOLAMINE 1 MG/3DAYS TD PT72
1.0000 | MEDICATED_PATCH | Freq: Once | TRANSDERMAL | Status: DC
  Administered 2018-02-01: 1.5 mg via TRANSDERMAL

## 2018-02-01 MED ORDER — HYDROMORPHONE HCL 1 MG/ML IJ SOLN
1.0000 mg | INTRAMUSCULAR | Status: DC | PRN
  Administered 2018-02-02 (×2): 1 mg via INTRAVENOUS
  Filled 2018-02-01 (×2): qty 1

## 2018-02-01 MED ORDER — FENTANYL CITRATE (PF) 100 MCG/2ML IJ SOLN
INTRAMUSCULAR | Status: AC
  Filled 2018-02-01: qty 2

## 2018-02-01 MED ORDER — KETOROLAC TROMETHAMINE 30 MG/ML IJ SOLN
INTRAMUSCULAR | Status: DC | PRN
  Administered 2018-02-01: 30 mg via INTRAVENOUS

## 2018-02-01 MED ORDER — SENNA 8.6 MG PO TABS
1.0000 | ORAL_TABLET | Freq: Two times a day (BID) | ORAL | Status: DC
  Administered 2018-02-01 – 2018-02-02 (×2): 8.6 mg via ORAL
  Filled 2018-02-01 (×4): qty 1

## 2018-02-01 MED ORDER — ONDANSETRON HCL 4 MG/2ML IJ SOLN: 4.0000 mg | Freq: Four times a day (QID) | INTRAMUSCULAR | Status: DC | PRN

## 2018-02-01 MED ORDER — FENTANYL CITRATE (PF) 250 MCG/5ML IJ SOLN
INTRAMUSCULAR | Status: AC
  Filled 2018-02-01: qty 5

## 2018-02-01 MED ORDER — ACETAMINOPHEN 10 MG/ML IV SOLN
INTRAVENOUS | Status: AC
  Filled 2018-02-01: qty 100

## 2018-02-01 MED ORDER — ZOLPIDEM TARTRATE 5 MG PO TABS: 5.0000 mg | ORAL_TABLET | Freq: Every evening | ORAL | Status: DC | PRN

## 2018-02-01 MED ORDER — KETOROLAC TROMETHAMINE 30 MG/ML IJ SOLN
30.0000 mg | Freq: Four times a day (QID) | INTRAMUSCULAR | Status: AC
  Administered 2018-02-01 – 2018-02-02 (×3): 30 mg via INTRAVENOUS
  Filled 2018-02-01 (×3): qty 1

## 2018-02-01 MED ORDER — MIDAZOLAM HCL 2 MG/2ML IJ SOLN
INTRAMUSCULAR | Status: DC | PRN
  Administered 2018-02-01: 2 mg via INTRAVENOUS

## 2018-02-01 MED ORDER — ROCURONIUM BROMIDE 100 MG/10ML IV SOLN
INTRAVENOUS | Status: DC | PRN
  Administered 2018-02-01: 50 mg via INTRAVENOUS

## 2018-02-01 MED ORDER — LACTATED RINGERS IV SOLN
INTRAVENOUS | Status: DC
  Administered 2018-02-01 – 2018-02-02 (×2): via INTRAVENOUS

## 2018-02-01 MED ORDER — SCOPOLAMINE 1 MG/3DAYS TD PT72
MEDICATED_PATCH | TRANSDERMAL | Status: AC
  Filled 2018-02-01: qty 1

## 2018-02-01 MED ORDER — MIDAZOLAM HCL 2 MG/2ML IJ SOLN
INTRAMUSCULAR | Status: AC
  Filled 2018-02-01: qty 2

## 2018-02-01 MED ORDER — CEFAZOLIN SODIUM-DEXTROSE 2-4 GM/100ML-% IV SOLN
2.0000 g | INTRAVENOUS | Status: AC
  Administered 2018-02-01: 2 g via INTRAVENOUS

## 2018-02-01 MED ORDER — HYDROMORPHONE HCL 1 MG/ML IJ SOLN
0.2500 mg | INTRAMUSCULAR | Status: DC | PRN
  Administered 2018-02-01 (×4): 0.5 mg via INTRAVENOUS

## 2018-02-01 MED ORDER — HYDROMORPHONE HCL 1 MG/ML IJ SOLN
INTRAMUSCULAR | Status: DC | PRN
  Administered 2018-02-01: 1 mg via INTRAVENOUS

## 2018-02-01 MED ORDER — PROPOFOL 10 MG/ML IV BOLUS
INTRAVENOUS | Status: AC
  Filled 2018-02-01: qty 20

## 2018-02-01 MED ORDER — LIDOCAINE HCL (CARDIAC) 20 MG/ML IV SOLN
INTRAVENOUS | Status: AC
  Filled 2018-02-01: qty 5

## 2018-02-01 MED ORDER — IBUPROFEN 800 MG PO TABS: 800.0000 mg | ORAL_TABLET | Freq: Three times a day (TID) | ORAL | Status: DC

## 2018-02-01 MED ORDER — SUGAMMADEX SODIUM 200 MG/2ML IV SOLN
INTRAVENOUS | Status: DC | PRN
  Administered 2018-02-01: 200 mg via INTRAVENOUS

## 2018-02-01 MED ORDER — DEXAMETHASONE SODIUM PHOSPHATE 4 MG/ML IJ SOLN
INTRAMUSCULAR | Status: DC | PRN
  Administered 2018-02-01: 4 mg via INTRAVENOUS

## 2018-02-01 MED ORDER — MOMETASONE FURO-FORMOTEROL FUM 200-5 MCG/ACT IN AERO
2.0000 | INHALATION_SPRAY | Freq: Two times a day (BID) | RESPIRATORY_TRACT | Status: DC
  Administered 2018-02-02: 2 via RESPIRATORY_TRACT
  Filled 2018-02-01: qty 8.8

## 2018-02-01 MED ORDER — SIMETHICONE 80 MG PO CHEW: 80.0000 mg | CHEWABLE_TABLET | Freq: Four times a day (QID) | ORAL | Status: DC | PRN

## 2018-02-01 MED ORDER — 0.9 % SODIUM CHLORIDE (POUR BTL) OPTIME
TOPICAL | Status: DC | PRN
  Administered 2018-02-01: 1000 mL

## 2018-02-01 MED ORDER — DEXAMETHASONE SODIUM PHOSPHATE 4 MG/ML IJ SOLN
INTRAMUSCULAR | Status: AC
  Filled 2018-02-01: qty 1

## 2018-02-01 MED ORDER — PROPOFOL 10 MG/ML IV BOLUS
INTRAVENOUS | Status: DC | PRN
  Administered 2018-02-01: 200 mg via INTRAVENOUS

## 2018-02-01 MED ORDER — OXYCODONE-ACETAMINOPHEN 5-325 MG PO TABS
2.0000 | ORAL_TABLET | ORAL | Status: DC | PRN
  Administered 2018-02-02: 2 via ORAL
  Filled 2018-02-01: qty 2

## 2018-02-01 MED ORDER — ACETAMINOPHEN 10 MG/ML IV SOLN
1000.0000 mg | Freq: Once | INTRAVENOUS | Status: AC
  Administered 2018-02-01: 1000 mg via INTRAVENOUS

## 2018-02-01 MED ORDER — LACTATED RINGERS IV SOLN
INTRAVENOUS | Status: DC
  Administered 2018-02-01 (×2): via INTRAVENOUS

## 2018-02-01 MED ORDER — SOD CITRATE-CITRIC ACID 500-334 MG/5ML PO SOLN
ORAL | Status: AC
  Filled 2018-02-01: qty 15

## 2018-02-01 MED ORDER — SOD CITRATE-CITRIC ACID 500-334 MG/5ML PO SOLN
30.0000 mL | ORAL | Status: AC
  Administered 2018-02-01: 30 mL via ORAL

## 2018-02-01 MED ORDER — SUGAMMADEX SODIUM 200 MG/2ML IV SOLN
INTRAVENOUS | Status: AC
  Filled 2018-02-01: qty 2

## 2018-02-01 MED ORDER — ONDANSETRON HCL 4 MG/2ML IJ SOLN
INTRAMUSCULAR | Status: DC | PRN
  Administered 2018-02-01: 4 mg via INTRAVENOUS

## 2018-02-01 MED ORDER — METOCLOPRAMIDE HCL 5 MG/ML IJ SOLN: 10.0000 mg | Freq: Once | INTRAMUSCULAR | Status: DC | PRN

## 2018-02-01 MED ORDER — LACTATED RINGERS IV SOLN: INTRAVENOUS | Status: DC

## 2018-02-01 MED ORDER — MEPERIDINE HCL 25 MG/ML IJ SOLN: 6.2500 mg | INTRAMUSCULAR | Status: DC | PRN

## 2018-02-01 SURGICAL SUPPLY — 25 items
BLADE SURG 10 STRL SS (BLADE) ×2 IMPLANT
CANISTER SUCT 3000ML PPV (MISCELLANEOUS) ×2 IMPLANT
CONT PATH 16OZ SNAP LID 3702 (MISCELLANEOUS) IMPLANT
DECANTER SPIKE VIAL GLASS SM (MISCELLANEOUS) IMPLANT
DRAPE HYSTEROSCOPY (DRAPE) ×1 IMPLANT
GLOVE BIO SURGEON STRL SZ7.5 (GLOVE) ×2 IMPLANT
GLOVE BIO SURGEON STRL SZ8 (GLOVE) ×2 IMPLANT
GLOVE BIOGEL PI IND STRL 6.5 (GLOVE) ×1 IMPLANT
GLOVE BIOGEL PI IND STRL 7.0 (GLOVE) ×2 IMPLANT
GLOVE BIOGEL PI INDICATOR 6.5 (GLOVE) ×1
GLOVE BIOGEL PI INDICATOR 7.0 (GLOVE) ×2
GOWN STRL REUS W/TWL LRG LVL3 (GOWN DISPOSABLE) ×6 IMPLANT
GOWN STRL REUS W/TWL XL LVL3 (GOWN DISPOSABLE) ×2 IMPLANT
LEGGING LITHOTOMY PAIR STRL (DRAPES) ×1 IMPLANT
NS IRRIG 1000ML POUR BTL (IV SOLUTION) ×2 IMPLANT
PACK VAGINAL WOMENS (CUSTOM PROCEDURE TRAY) ×2 IMPLANT
PAD OB MATERNITY 4.3X12.25 (PERSONAL CARE ITEMS) ×2 IMPLANT
SUT VIC AB 2-0 CT1 18 (SUTURE) ×2 IMPLANT
SUT VIC AB 2-0 CT1 27 (SUTURE)
SUT VIC AB 2-0 CT1 TAPERPNT 27 (SUTURE) IMPLANT
SUT VIC AB PLUS 45CM 1-MO-4 (SUTURE) ×4 IMPLANT
SUT VICRYL 0 ENDOLOOP (SUTURE) IMPLANT
SUT VICRYL 1 TIES 12X18 (SUTURE) ×2 IMPLANT
TOWEL OR 17X24 6PK STRL BLUE (TOWEL DISPOSABLE) ×4 IMPLANT
TRAY FOLEY CATH SILVER 14FR (SET/KITS/TRAYS/PACK) ×2 IMPLANT

## 2018-02-01 NOTE — H&P (Signed)
Lauren Garza is an 37 y.o. G3P3 with long standing H/O DUB, refactory to medical therapy. Known uterine fibroids as well. She desires definite therapy.    Menstrual History: Menarche age: 62 No LMP recorded.    Past Medical History:  Diagnosis Date  . Abnormal Pap smear   . Acid reflux   . Asthma   . Bacterial vaginosis   . Blood dyscrasia   . Chlamydia trachomatis infection 2012  . DUB (dysfunctional uterine bleeding)   . Family history of adverse reaction to anesthesia    mom has n and V past surgery  . Frequent headaches    migraines  . Hypertension     Past Surgical History:  Procedure Laterality Date  . GYNECOLOGIC CRYOSURGERY    . I&D EXTREMITY Right 04/04/2015   Procedure: IRRIGATION AND DEBRIDEMENT RIGHT WRIST;  Surgeon: Leanora Cover, MD;  Location: [place];  Service: Orthopedics;  Laterality: Right;  . SINUS ENDO WITH FUSION N/A 01/25/2017   Procedure: SINUS ENDO WITH FUSION;  Surgeon: Melida Quitter, MD;  Location: [place];  Service: ENT;  Laterality: N/A;  . TUBAL LIGATION    . WISDOM TOOTH EXTRACTION    . WRIST SURGERY      Family History  Problem Relation Age of Onset  . Heart attack Father   . Hypertension Mother   . Diabetes Maternal Grandmother     Social History:  reports that she has quit smoking. she has never used smokeless tobacco. She reports that she drinks alcohol. She reports that she does not use drugs.  Allergies:  Allergies  Allergen Reactions  . Clindamycin/Lincomycin Hives  . Dust Mite Extract   . Flagyl [Metronidazole Hcl] Hives  . Lincomycin Hcl Hives    No medications prior to admission.    Review of Systems  Constitutional: Negative.   Respiratory: Negative.   Cardiovascular: Negative.   Gastrointestinal: Negative.   Genitourinary: Negative.     There were no vitals taken for this visit. Physical Exam  Constitutional: She appears well-developed and well-nourished.  Cardiovascular: Normal rate and  regular rhythm.  Respiratory: Effort normal and breath sounds normal.  GI: Soft. Bowel sounds are normal.  Genitourinary:  Genitourinary Comments: Nl EGBUS uterus < 10 weeks size, mobile non tender no adnexal mass or tenderness    No results found for this or any previous visit (from the past 24 hour(s)).  No results found.  Assessment/Plan: DUB Uterine fibroids  TVH/BS has been reviewed with pt. R/B/Post-OP care have been reviewed. Pt has verbalized understanding and desires to proceed.   Chancy Milroy 02/01/2018, 1:23 PM

## 2018-02-01 NOTE — Op Note (Signed)
Shirlean Kelly PROCEDURE DATE: 02/01/2018  PREOPERATIVE DIAGNOSIS:  Symptomatic fibroids, DUB POSTOPERATIVE DIAGNOSIS:  Symptomatic fibroids, DUB SURGEON:   Chancy Milroy M.D., FACOG ASSISTANT: Clovia Cuff, M.D. OPERATION:  Total Vaginal hysterectomy with bilateral salpingectomy ANESTHESIA:  General endotracheal.  INDICATIONS: The patient is a 37 y.o. P6P9509 with history of symptomatic uterine fibroids/menorrhagia. The patient made a decision to undergo definite surgical treatment. On the preoperative visit, the risks, benefits, indications, and alternatives of the procedure were reviewed with the patient.  On the day of surgery, the risks of surgery were again discussed with the patient including but not limited to: bleeding which may require transfusion or reoperation; infection which may require antibiotics; injury to bowel, bladder, ureters or other surrounding organs; need for additional procedures; thromboembolic phenomenon, incisional problems and other postoperative/anesthesia complications. Written informed consent was obtained.    OPERATIVE FINDINGS: A 10 week size uterus with normal tubes and ovaries bilaterally.  ESTIMATED BLOOD LOSS: 100 ml FLUIDS:  As recorded URINE OUTPUT:  As recorded SPECIMENS:  Uterus and cervix and tubes sent to pathology COMPLICATIONS:  None immediate.  DESCRIPTION OF PROCEDURE:  The patient received intravenous antibiotics and had sequential compression devices applied to her lower extremities while in the preoperative area.  She was then taken to the operating room where general anesthesia was administered and was found to be adequate.  She was placed in the dorsal lithotomy position, and was prepped and draped in a sterile manner.  The patients bladder was drained with a foley and it was left in dwelling.  After an adequate timeout was performed, attention was turned to her pelvis.  A weighted speculum was then placed in the vagina. The posterior lip of  the cervix was grasped and entry was gained posteriorly. Long bill weighted speculum was placed. Attention was then directed to the  anterior lip of the cervix which was grasped. The anterior vaginal mucosa was sharply circumscribed. The endopelvic fascia was identified and grasp, cut, anterior peritonenum was grasped and entry anteriorly was obtained.  Ziplin clamps were then used to clamp the uterosacral ligaments on either side.  They were then cut and sutured ligated with 0 Vicryl, and the ligated uterosacral ligaments were transfixed to the posterior lateral vaginal epithelium to further support the vagina and provide hemostasis. Of note, all sutures used in this case were 0 Vicryl unless otherwise noted.  The cardinal ligaments were then clamped, cut and ligated. The uterine vessels and broad ligaments were then serially clamped with the Ziplin clamps, cut, and suture ligated on both sides. The final pedicle involving the uteroovarian was clamped on both sides. The pedicule was tie with a free tie and then in a [place] fashion. The specimen was then passed off for pathology. The fallopian tubes on the each side grasped with a Kelly clamp, transected and suture ligated.   All pedicles from the uterosacral ligament to the cornua were examined hemostasis was confirmed. The peritoneum was then closed in a purse string fashion.  The vaginal cuff was closed with 2/0 Vicryl.  All instruments were then removed from the pelvis and a vaginal packing saturated with estrogen cream was placed.  The patient tolerated the procedure well.  All instruments, needles, and sponge counts were correct x 2. The patient was taken to the recovery room in stable condition.

## 2018-02-01 NOTE — Anesthesia Preprocedure Evaluation (Signed)
Anesthesia Evaluation  Patient identified by MRN, date of birth, ID band Patient awake    Reviewed: Allergy & Precautions, NPO status , Patient's Chart, lab work & pertinent test results  Airway Mallampati: I  TM Distance: >3 FB Neck ROM: Full    Dental no notable dental hx.    Pulmonary asthma , former smoker,    Pulmonary exam normal breath sounds clear to auscultation       Cardiovascular hypertension, Pt. on medications Normal cardiovascular exam Rhythm:Regular Rate:Normal     Neuro/Psych negative neurological ROS  negative psych ROS   GI/Hepatic negative GI ROS, Neg liver ROS,   Endo/Other  negative endocrine ROS  Renal/GU negative Renal ROS  negative genitourinary   Musculoskeletal negative musculoskeletal ROS (+)   Abdominal   Peds negative pediatric ROS (+)  Hematology negative hematology ROS (+)   Anesthesia Other Findings   Reproductive/Obstetrics negative OB ROS                             Anesthesia Physical Anesthesia Plan  ASA: II  Anesthesia Plan: General   Post-op Pain Management:    Induction: Intravenous  PONV Risk Score and Plan: 4 or greater and Ondansetron, Dexamethasone, Scopolamine patch - Pre-op, Midazolam and Treatment may vary due to age or medical condition  Airway Management Planned: Oral ETT  Additional Equipment:   Intra-op Plan:   Post-operative Plan: Extubation in OR  Informed Consent: I have reviewed the patients History and Physical, chart, labs and discussed the procedure including the risks, benefits and alternatives for the proposed anesthesia with the patient or authorized representative who has indicated his/her understanding and acceptance.   Dental advisory given  Plan Discussed with: CRNA  Anesthesia Plan Comments:         Anesthesia Quick Evaluation

## 2018-02-01 NOTE — Transfer of Care (Signed)
Immediate Anesthesia Transfer of Care Note  Patient: Lauren Garza  Procedure(s) Performed: HYSTERECTOMY VAGINAL WITH SALPINGECTOMY (Bilateral Vagina )  Patient Location: PACU  Anesthesia Type:General  Level of Consciousness: awake, alert  and oriented  Airway & Oxygen Therapy: Patient Spontanous Breathing and Patient connected to nasal cannula oxygen  Post-op Assessment: Report given to RN and Post -op Vital signs reviewed and stable  Post vital signs: Reviewed and stable  Last Vitals:  Vitals:   02/01/18 1422  BP: 133/89  Pulse: 85  Resp: 16  Temp: 36.6 C    Last Pain:  Vitals:   02/01/18 1422  TempSrc: Oral  PainSc: 2       Patients Stated Pain Goal: 2 (32/20/25 4270)  Complications: No apparent anesthesia complications

## 2018-02-01 NOTE — Progress Notes (Signed)
Recvd report from [place], Therapist, sports.  Courtney in Lab called with patient's negative pregnancy results.

## 2018-02-01 NOTE — Plan of Care (Signed)
Education Initiated

## 2018-02-01 NOTE — Anesthesia Procedure Notes (Signed)
Procedure Name: Intubation Date/Time: 02/01/2018 4:11 PM Performed by: Raenette Rover, CRNA Pre-anesthesia Checklist: Patient identified, Emergency Drugs available, Suction available and Patient being monitored Patient Re-evaluated:Patient Re-evaluated prior to induction Oxygen Delivery Method: Circle system utilized Preoxygenation: Pre-oxygenation with 100% oxygen Induction Type: IV induction Ventilation: Mask ventilation without difficulty Laryngoscope Size: Mac and 3 Grade View: Grade I Tube type: Oral Tube size: 7.0 mm Number of attempts: 1 Airway Equipment and Method: Stylet Placement Confirmation: ETT inserted through vocal cords under direct vision,  positive ETCO2,  CO2 detector and breath sounds checked- equal and bilateral Secured at: 20 cm Tube secured with: Tape Dental Injury: Teeth and Oropharynx as per pre-operative assessment

## 2018-02-02 ENCOUNTER — Encounter (HOSPITAL_COMMUNITY): Payer: Self-pay

## 2018-02-02 ENCOUNTER — Encounter (HOSPITAL_COMMUNITY): Payer: Self-pay | Admitting: Obstetrics and Gynecology

## 2018-02-02 DIAGNOSIS — N800 Endometriosis of uterus: Secondary | ICD-10-CM | POA: Diagnosis not present

## 2018-02-02 LAB — BASIC METABOLIC PANEL
Anion gap: 7 (ref 5–15)
BUN: 7 mg/dL (ref 6–20)
CALCIUM: 8.4 mg/dL — AB (ref 8.9–10.3)
CHLORIDE: 102 mmol/L (ref 101–111)
CO2: 26 mmol/L (ref 22–32)
Creatinine, Ser: 0.78 mg/dL (ref 0.44–1.00)
GFR calc non Af Amer: >60 mL/min (ref >60)
Glucose, Bld: 112 mg/dL — ABNORMAL HIGH (ref 65–99)
Potassium: 4.1 mmol/L (ref 3.5–5.1)
SODIUM: 135 mmol/L (ref 135–145)

## 2018-02-02 LAB — CBC
HEMATOCRIT: 35.9 % — AB (ref 36.0–46.0)
HEMOGLOBIN: 11.4 g/dL — AB (ref 12.0–15.0)
MCH: 25.9 pg — AB (ref 26.0–34.0)
MCHC: 31.8 g/dL (ref 30.0–36.0)
MCV: 81.4 fL (ref 78.0–100.0)
Platelets: 401 10*3/uL — ABNORMAL HIGH (ref 150–400)
RBC: 4.41 MIL/uL (ref 3.87–5.11)
RDW: 15.5 % (ref 11.5–15.5)
WBC: 11 10*3/uL — ABNORMAL HIGH (ref 4.0–10.5)

## 2018-02-02 MED ORDER — SENNA 8.6 MG PO TABS: 1.0000 | ORAL_TABLET | Freq: Two times a day (BID) | ORAL | 0 refills | Status: DC

## 2018-02-02 MED ORDER — OXYCODONE-ACETAMINOPHEN 5-325 MG PO TABS: 2.0000 | ORAL_TABLET | ORAL | 0 refills | Status: DC | PRN

## 2018-02-02 NOTE — Discharge Instructions (Signed)
Vaginal Hysterectomy, Care After °Refer to this sheet in the next few weeks. These instructions provide you with information about caring for yourself after your procedure. Your health care provider may also give you more specific instructions. Your treatment has been planned according to current medical practices, but problems sometimes occur. Call your health care provider if you have any problems or questions after your procedure. °What can I expect after the procedure? °After the procedure, it is common to have: °· Pain. °· Soreness and numbness in your incision areas. °· Vaginal bleeding and discharge. °· Constipation. °· Temporary problems emptying the bladder. °· Feelings of sadness or other emotions. ° °Follow these instructions at home: °Medicines °· Take over-the-counter and prescription medicines only as told by your health care provider. °· If you were prescribed an antibiotic medicine, take it as told by your health care provider. Do not stop taking the antibiotic even if you start to feel better. °· Do not drive or operate heavy machinery while taking prescription pain medicine. °Activity °· Return to your normal activities as told by your health care provider. Ask your health care provider what activities are safe for you. °· Get regular exercise as told by your health care provider. You may be told to take short walks every day and go farther each time. °· Do not lift anything that is heavier than 10 lb (4.5 kg). °General instructions ° °· Do not put anything in your vagina for 6 weeks after your surgery or as told by your health care provider. This includes tampons and douches. °· Do not have sex until your health care provider says you can. °· Do not take baths, swim, or use a hot tub until your health care provider approves. °· Drink enough fluid to keep your urine clear or pale yellow. °· Do not drive for 24 hours if you were given a sedative. °· Keep all follow-up visits as told by your health  care provider. This is important. °Contact a health care provider if: °· Your pain medicine is not helping. °· You have a fever. °· You have redness, swelling, or pain at your incision site. °· You have blood, pus, or a bad-smelling discharge from your vagina. °· You continue to have difficulty urinating. °Get help right away if: °· You have severe abdominal or back pain. °· You have heavy bleeding from your vagina. °· You have chest pain or shortness of breath. °This information is not intended to replace advice given to you by your health care provider. Make sure you discuss any questions you have with your health care provider. °Document Released: 03/09/2016 Document Revised: 04/23/2016 Document Reviewed: 12/01/2015 °Elsevier Interactive Patient Education © 2018 [employer] Inc. ° °

## 2018-02-02 NOTE — Discharge Summary (Signed)
Physician Discharge Summary  Patient ID: Lauren Garza MRN: [id number] DOB/AGE: [birth year] 37 y.o.  Admit date: 02/01/2018 Discharge date: 02/02/2018  Admission Diagnoses: DUB and uterine fibroids  Discharge Diagnoses:  Active Problems:   Post-operative state   Discharged Condition: good  Hospital Course: Pt was admitted with above Dx. Underwent TVH/BS without problems. See OP note for additional information. Post op course was unremarkable. Pt progressed to ambulating, voiding,  flatus, tolerating diet and good oral pain control. Felt amendable for discharge home. Discharge instructions and medications reviewed.   Consults: None  Significant Diagnostic Studies: labs  Treatments: surgery: TVH/BS  Discharge Exam: Blood pressure 113/76, pulse 79, temperature 99.1 F (37.3 C), temperature source Oral, resp. rate 20, last menstrual period 01/04/2018, SpO2 98 %.   Lungs clear  Heart RRR Abd soft + BS GU no bleeding Ext non tedner  Disposition: 01-Home or Self Care  Discharge Instructions    Call MD for:  difficulty breathing, headache or visual disturbances   Complete by:  As directed    Call MD for:  extreme fatigue   Complete by:  As directed    Call MD for:  hives   Complete by:  As directed    Call MD for:  persistant dizziness or light-headedness   Complete by:  As directed    Call MD for:  persistant nausea and vomiting   Complete by:  As directed    Call MD for:  redness, tenderness, or signs of infection (pain, swelling, redness, odor or green/yellow discharge around incision site)   Complete by:  As directed    Call MD for:  severe uncontrolled pain   Complete by:  As directed    Call MD for:  temperature >100.4   Complete by:  As directed    Diet - low sodium heart healthy   Complete by:  As directed    Increase activity slowly   Complete by:  As directed    Sexual Activity Restrictions   Complete by:  As directed    Pelvic rest x 6 weeks      Allergies as of 02/02/2018      Reactions   Clindamycin/lincomycin Hives   Dust Mite Extract    Flagyl [metronidazole Hcl] Hives   Lincomycin Hcl Hives      Medication List    TAKE these medications   Biotin 10000 MCG Tabs Take 1 tablet by mouth daily.   budesonide-formoterol 160-4.5 MCG/ACT inhaler Commonly known as:  SYMBICORT Inhale 2 puffs into the lungs 2 (two) times daily.   cetirizine 10 MG tablet Commonly known as:  ZYRTEC Take 10 mg by mouth daily.   EPINEPHrine 0.3 mg/0.3 mL Soaj injection Commonly known as:  EPI-PEN Inject 0.3 mg into the muscle once as needed.   montelukast 10 MG tablet Commonly known as:  SINGULAIR   Omega-3 1000 MG Caps Take 1 capsule by mouth daily.   oxyCODONE-acetaminophen 5-325 MG tablet Commonly known as:  PERCOCET/ROXICET Take 2 tablets by mouth every 4 (four) hours as needed for severe pain ((when tolerating fluids)).   predniSONE 10 MG tablet Commonly known as:  DELTASONE Take 10 mg by mouth See admin instructions. 40mg  x 7 days, then decrease to 30mg  x 3 days, 20mg  x 3 days, 10mg  x 3 days.   PROAIR HFA 108 (90 Base) MCG/ACT inhaler Generic drug:  albuterol Inhale 1 puff into the lungs every 4 (four) hours as needed for wheezing or shortness of breath.  senna 8.6 MG Tabs tablet Commonly known as:  SENOKOT Take 1 tablet (8.6 mg total) by mouth 2 (two) times daily.   triamterene-hydrochlorothiazide 37.5-25 MG capsule Commonly known as:  DYAZIDE Take 1 capsule by mouth daily.      Follow-up Information    [facility]. Schedule an appointment as soon as possible for a visit in 4 week(s).   Why:  Post op appt with Dr. Gardiner Fanti Contact information: [address] [address] [phone]          Signed: Chancy Milroy 02/02/2018, 1:31 PM

## 2018-02-02 NOTE — Progress Notes (Signed)

## 2018-02-03 NOTE — Anesthesia Postprocedure Evaluation (Signed)
Anesthesia Post Note  Patient: [name]  Procedure(s) Performed: HYSTERECTOMY VAGINAL WITH SALPINGECTOMY (Bilateral Vagina )     Patient location during evaluation: PACU Anesthesia Type: General Level of consciousness: awake and alert Pain management: pain level controlled Vital Signs Assessment: post-procedure vital signs reviewed and stable Respiratory status: spontaneous breathing, nonlabored ventilation, respiratory function stable and patient connected to nasal cannula oxygen Cardiovascular status: blood pressure returned to baseline and stable Postop Assessment: no apparent nausea or vomiting Anesthetic complications: no    Last Vitals:  Vitals:   02/02/18 0830 02/02/18 1148  BP: 128/69 113/76  Pulse: 99 79  Resp: 20 20  Temp: 37.2 C 37.3 C  SpO2: 99% 98%    Last Pain:  Vitals:   02/02/18 1148  TempSrc: Oral  PainSc:                  Montez Hageman

## 2018-02-04 ENCOUNTER — Encounter: Payer: Self-pay | Admitting: Obstetrics and Gynecology

## 2018-02-04 ENCOUNTER — Other Ambulatory Visit: Payer: Self-pay

## 2018-02-04 ENCOUNTER — Telehealth: Payer: Self-pay

## 2018-02-04 ENCOUNTER — Ambulatory Visit: Payer: BLUE CROSS/BLUE SHIELD

## 2018-02-04 DIAGNOSIS — R300 Dysuria: Secondary | ICD-10-CM

## 2018-02-04 MED ORDER — PHENAZOPYRIDINE HCL 200 MG PO TABS: 200.0000 mg | ORAL_TABLET | Freq: Two times a day (BID) | ORAL | 0 refills | Status: DC

## 2018-02-04 NOTE — Telephone Encounter (Signed)
Pt c/o dysuria and feeling like she's not completely emptying her bladder. Consulted with the doctor. Bring pt for urine culture and pt to take Pyridium 200 mg bid x 7 days until urine culture returns. Pt has transportation issues and will contact the office once she finds a ride.

## 2018-02-04 NOTE — Telephone Encounter (Signed)
Pt called back. She is on her way now.

## 2018-02-04 NOTE — Progress Notes (Signed)
py

## 2018-02-04 NOTE — Progress Notes (Signed)
Pt is in the office to leave urine sample for culture, per provider orders. Notified pt of rx sent to pharmacy by provider.

## 2018-02-06 ENCOUNTER — Inpatient Hospital Stay (HOSPITAL_COMMUNITY): Payer: BLUE CROSS/BLUE SHIELD

## 2018-02-06 ENCOUNTER — Inpatient Hospital Stay (EMERGENCY_DEPARTMENT_HOSPITAL)
Admission: AD | Admit: 2018-02-06 | Discharge: 2018-02-06 | Disposition: A | Discharge status: Home / Self Care | Payer: BLUE CROSS/BLUE SHIELD | Source: Ambulatory Visit | Attending: Emergency Medicine | Admitting: Emergency Medicine

## 2018-02-06 ENCOUNTER — Encounter (HOSPITAL_COMMUNITY): Payer: Self-pay | Admitting: *Deleted

## 2018-02-06 DIAGNOSIS — Z90710 Acquired absence of both cervix and uterus: Secondary | ICD-10-CM

## 2018-02-06 DIAGNOSIS — B9689 Other specified bacterial agents as the cause of diseases classified elsewhere: Secondary | ICD-10-CM | POA: Diagnosis not present

## 2018-02-06 DIAGNOSIS — G8918 Other acute postprocedural pain: Secondary | ICD-10-CM

## 2018-02-06 DIAGNOSIS — Z79899 Other long term (current) drug therapy: Secondary | ICD-10-CM

## 2018-02-06 DIAGNOSIS — Z9851 Tubal ligation status: Secondary | ICD-10-CM

## 2018-02-06 DIAGNOSIS — B9620 Unspecified Escherichia coli [E. coli] as the cause of diseases classified elsewhere: Secondary | ICD-10-CM | POA: Diagnosis not present

## 2018-02-06 DIAGNOSIS — I10 Essential (primary) hypertension: Secondary | ICD-10-CM

## 2018-02-06 DIAGNOSIS — N3000 Acute cystitis without hematuria: Secondary | ICD-10-CM

## 2018-02-06 DIAGNOSIS — M25572 Pain in left ankle and joints of left foot: Secondary | ICD-10-CM | POA: Insufficient documentation

## 2018-02-06 DIAGNOSIS — X58XXXA Exposure to other specified factors, initial encounter: Secondary | ICD-10-CM

## 2018-02-06 DIAGNOSIS — Z87891 Personal history of nicotine dependence: Secondary | ICD-10-CM | POA: Diagnosis not present

## 2018-02-06 DIAGNOSIS — N200 Calculus of kidney: Secondary | ICD-10-CM | POA: Diagnosis not present

## 2018-02-06 DIAGNOSIS — S99912A Unspecified injury of left ankle, initial encounter: Secondary | ICD-10-CM

## 2018-02-06 DIAGNOSIS — N12 Tubulo-interstitial nephritis, not specified as acute or chronic: Secondary | ICD-10-CM | POA: Diagnosis present

## 2018-02-06 DIAGNOSIS — R55 Syncope and collapse: Secondary | ICD-10-CM | POA: Insufficient documentation

## 2018-02-06 DIAGNOSIS — E860 Dehydration: Secondary | ICD-10-CM

## 2018-02-06 DIAGNOSIS — J45909 Unspecified asthma, uncomplicated: Secondary | ICD-10-CM | POA: Diagnosis not present

## 2018-02-06 LAB — COMPREHENSIVE METABOLIC PANEL
ALK PHOS: 108 U/L (ref 38–126)
ALT: 44 U/L (ref 14–54)
AST: 64 U/L — AB (ref 15–41)
Albumin: 2.9 g/dL — ABNORMAL LOW (ref 3.5–5.0)
Anion gap: 9 (ref 5–15)
BUN: 8 mg/dL (ref 6–20)
CHLORIDE: 99 mmol/L — AB (ref 101–111)
CO2: 25 mmol/L (ref 22–32)
CREATININE: 0.97 mg/dL (ref 0.44–1.00)
Calcium: 8.3 mg/dL — ABNORMAL LOW (ref 8.9–10.3)
GFR calc non Af Amer: >60 mL/min (ref >60)
Glucose, Bld: 125 mg/dL — ABNORMAL HIGH (ref 65–99)
Potassium: 3.9 mmol/L (ref 3.5–5.1)
SODIUM: 133 mmol/L — AB (ref 135–145)
TOTAL PROTEIN: 6.6 g/dL (ref 6.5–8.1)
Total Bilirubin: 0.6 mg/dL (ref 0.3–1.2)

## 2018-02-06 LAB — URINALYSIS, ROUTINE W REFLEX MICROSCOPIC
BACTERIA UA: NONE SEEN
BILIRUBIN URINE: NEGATIVE
Glucose, UA: NEGATIVE mg/dL
Hgb urine dipstick: NEGATIVE
Ketones, ur: NEGATIVE mg/dL
LEUKOCYTES UA: NEGATIVE
Nitrite: POSITIVE — AB
Protein, ur: NEGATIVE mg/dL
SPECIFIC GRAVITY, URINE: 1.013 (ref 1.005–1.030)
pH: 7 (ref 5.0–8.0)

## 2018-02-06 LAB — URINE CULTURE

## 2018-02-06 LAB — CBC WITH DIFFERENTIAL/PLATELET
Basophils Absolute: 0 10*3/uL (ref 0.0–0.1)
Basophils Relative: 0 %
EOS PCT: 0 %
Eosinophils Absolute: 0 10*3/uL (ref 0.0–0.7)
HEMATOCRIT: 34.7 % — AB (ref 36.0–46.0)
Hemoglobin: 11.2 g/dL — ABNORMAL LOW (ref 12.0–15.0)
LYMPHS ABS: 1.4 10*3/uL (ref 0.7–4.0)
LYMPHS PCT: 10 %
MCH: 26.4 pg (ref 26.0–34.0)
MCHC: 32.3 g/dL (ref 30.0–36.0)
MCV: 81.6 fL (ref 78.0–100.0)
MONO ABS: 0.4 10*3/uL (ref 0.1–1.0)
MONOS PCT: 2 %
NEUTROS ABS: 13.1 10*3/uL — AB (ref 1.7–7.7)
Neutrophils Relative %: 88 %
PLATELETS: 294 10*3/uL (ref 150–400)
RBC: 4.25 MIL/uL (ref 3.87–5.11)
RDW: 15.4 % (ref 11.5–15.5)
WBC: 14.9 10*3/uL — ABNORMAL HIGH (ref 4.0–10.5)

## 2018-02-06 LAB — I-STAT CG4 LACTIC ACID, ED: LACTIC ACID, VENOUS: 1.83 mmol/L (ref 0.5–1.9)

## 2018-02-06 MED ORDER — CEPHALEXIN 500 MG PO CAPS: 500.0000 mg | ORAL_CAPSULE | Freq: Three times a day (TID) | ORAL | 0 refills | Status: DC

## 2018-02-06 MED ORDER — LACTATED RINGERS IV BOLUS (SEPSIS)
1000.0000 mL | Freq: Once | INTRAVENOUS | Status: AC
  Administered 2018-02-06: 1000 mL via INTRAVENOUS

## 2018-02-06 MED ORDER — TAMSULOSIN HCL 0.4 MG PO CAPS: 0.4000 mg | ORAL_CAPSULE | Freq: Every day | ORAL | 0 refills | Status: DC

## 2018-02-06 MED ORDER — KETOROLAC TROMETHAMINE 30 MG/ML IJ SOLN
30.0000 mg | Freq: Once | INTRAMUSCULAR | Status: AC
  Administered 2018-02-06: 30 mg via INTRAVENOUS
  Filled 2018-02-06: qty 1

## 2018-02-06 MED ORDER — AMOXICILLIN-POT CLAVULANATE 875-125 MG PO TABS: 1.0000 | ORAL_TABLET | Freq: Two times a day (BID) | ORAL | 0 refills | Status: DC

## 2018-02-06 MED ORDER — ONDANSETRON 8 MG PO TBDP: 8.0000 mg | ORAL_TABLET | Freq: Three times a day (TID) | ORAL | 0 refills | Status: DC | PRN

## 2018-02-06 MED ORDER — SODIUM CHLORIDE 0.9 % IV SOLN
1.0000 g | Freq: Once | INTRAVENOUS | Status: AC
  Administered 2018-02-06: 1 g via INTRAVENOUS
  Filled 2018-02-06: qty 10

## 2018-02-06 MED ORDER — LACTATED RINGERS IV SOLN
INTRAVENOUS | Status: DC
  Administered 2018-02-06: 11:00:00 via INTRAVENOUS

## 2018-02-06 MED ORDER — IOPAMIDOL (ISOVUE-300) INJECTION 61%
100.0000 mL | Freq: Once | INTRAVENOUS | Status: AC | PRN
  Administered 2018-02-06: 100 mL via INTRAVENOUS

## 2018-02-06 NOTE — MAU Note (Addendum)
Pt reports pain in lower abdomen since discharge. Pt last took pain meds yesterday afternoon. Pt reports that before she left the hospital she felt like she was getting a UTI. But, no antibiotic was at the pharmacy only pain meds. Pt fell around fell around 3:30. Her parents called EMS. Pt reports that she hit her head on the floor. Pt also reports L Ankle pain

## 2018-02-06 NOTE — MAU Provider Note (Addendum)
History     CSN: [id number]  Arrival date and time: 02/06/18 6333   First Provider Initiated Contact with Patient 02/06/18 [phone]      Chief Complaint  Patient presents with  . Abdominal Pain  . Fall   HPI   Lauren Garza is a 37 y.o. female [id number] non- pregnant female status post total vaginal hysterectomy with bilateral salpingectomy on 3/5 here with lower abdominal pain and dizziness. States the pain has been there since the surgery. Last time she took her pain medication was yesterday. Currently rates her pain 8/10. Says she was standing in the kitchen at 0300 making oatmeal and she became dizzy and fell. No one witnessed the fall. Her father came into the kitchen and says that the patient was on her back. Thinks she may have hit her head on the floor. Has some pain in her left ankle, however is able to walk on it.   OB History    Gravida Para Term Preterm AB Living   _0 0 0 3   SAB TAB Ectopic Multiple Live Births   0 0 0 0        Past Medical History:  Diagnosis Date  . Abnormal Pap smear   . Acid reflux   . Asthma   . Bacterial vaginosis   . Chlamydia trachomatis infection 2012  . DUB (dysfunctional uterine bleeding)   . Family history of adverse reaction to anesthesia    mom has n and V past surgery  . Frequent headaches    migraines  . Hypertension     Past Surgical History:  Procedure Laterality Date  . GYNECOLOGIC CRYOSURGERY    . I&D EXTREMITY Right 04/04/2015   Procedure: IRRIGATION AND DEBRIDEMENT RIGHT WRIST;  Surgeon: Leanora Cover, MD;  Location: [place];  Service: Orthopedics;  Laterality: Right;  . SINUS ENDO WITH FUSION N/A 01/25/2017   Procedure: SINUS ENDO WITH FUSION;  Surgeon: Melida Quitter, MD;  Location: [place];  Service: ENT;  Laterality: N/A;  . TUBAL LIGATION    . VAGINAL HYSTERECTOMY Bilateral 02/01/2018   Procedure: HYSTERECTOMY VAGINAL WITH SALPINGECTOMY;  Surgeon: Chancy Milroy, MD;  Location: [facility];  Service:  Gynecology;  Laterality: Bilateral;  . WISDOM TOOTH EXTRACTION    . WRIST SURGERY      Family History  Problem Relation Age of Onset  . Heart attack Father   . Hypertension Mother   . Diabetes Maternal Grandmother     Social History   Tobacco Use  . Smoking status: Former Research scientist (life sciences)  . Smokeless tobacco: Never Used  . Tobacco comment: rare smoker 9 years ago  Substance Use Topics  . Alcohol use: Yes    Comment: Rare, 1x year  . Drug use: No    Allergies:  Allergies  Allergen Reactions  . Clindamycin/Lincomycin Hives  . Dust Mite Extract   . Flagyl [Metronidazole Hcl] Hives  . Lincomycin Hcl Hives    Medications Prior to Admission  Medication Sig Dispense Refill Last Dose  . oxyCODONE-acetaminophen (PERCOCET/ROXICET) 5-325 MG tablet Take 2 tablets by mouth every 4 (four) hours as needed for severe pain ((when tolerating fluids)). 30 tablet 0 02/05/2018 at Unknown time  . phenazopyridine (PYRIDIUM) 200 MG tablet Take 1 tablet (200 mg total) by mouth 2 (two) times daily. 14 tablet 0 02/05/2018 at Unknown time  . senna (SENOKOT) 8.6 MG TABS tablet Take 1 tablet (8.6 mg total) by mouth 2 (two) times daily. 120 each  0 Past Week at Unknown time  . albuterol (PROAIR HFA) 108 (90 Base) MCG/ACT inhaler Inhale 1 puff into the lungs every 4 (four) hours as needed for wheezing or shortness of breath.    More than a month at Unknown time  . Biotin 10000 MCG TABS Take 1 tablet by mouth daily.   Past Week at Unknown time  . budesonide-formoterol (SYMBICORT) 160-4.5 MCG/ACT inhaler Inhale 2 puffs into the lungs 2 (two) times daily.   02/01/2018 at Unknown time  . cetirizine (ZYRTEC) 10 MG tablet Take 10 mg by mouth daily.   0 01/31/2018 at Unknown time  . EPINEPHrine 0.3 mg/0.3 mL IJ SOAJ injection Inject 0.3 mg into the muscle once as needed.    Taking  . montelukast (SINGULAIR) 10 MG tablet   0 Past Month at Unknown time  . Omega-3 1000 MG CAPS Take 1 capsule by mouth daily.   Past Week at Unknown  time  . predniSONE (DELTASONE) 10 MG tablet Take 10 mg by mouth See admin instructions. 45m x 7 days, then decrease to 363mx 3 days, 2024m 3 days, 7m27m3 days.   Past Week at Unknown time  . triamterene-hydrochlorothiazide (DYAZIDE) 37.5-25 MG capsule Take 1 capsule by mouth daily.   01/31/2018 at Unknown time   Results for orders placed or performed during the hospital encounter of 02/06/18 (from the past 48 hour(s))  Urinalysis, Routine w reflex microscopic     Status: Abnormal   Collection Time: 02/06/18  5:59 AM  Result Value Ref Range   Color, Urine AMBER (A) YELLOW    Comment: BIOCHEMICALS MAY BE AFFECTED BY COLOR   APPearance CLEAR CLEAR   Specific Gravity, Urine 1.013 1.005 - 1.030   pH 7.0 5.0 - 8.0   Glucose, UA NEGATIVE NEGATIVE mg/dL   Hgb urine dipstick NEGATIVE NEGATIVE   Bilirubin Urine NEGATIVE NEGATIVE   Ketones, ur NEGATIVE NEGATIVE mg/dL   Protein, ur NEGATIVE NEGATIVE mg/dL   Nitrite POSITIVE (A) NEGATIVE   Leukocytes, UA NEGATIVE NEGATIVE   RBC / HPF 0-5 0 - 5 RBC/hpf   WBC, UA 0-5 0 - 5 WBC/hpf   Bacteria, UA NONE SEEN NONE SEEN   Squamous Epithelial / LPF 0-5 (A) NONE SEEN   Mucus PRESENT     Comment: Performed at [facility] [address] [id number] with Differential     Status: Abnormal   Collection Time: 02/06/18  6:02 AM  Result Value Ref Range   WBC 14.9 (H) 4.0 - 10.5 K/uL   RBC 4.25 3.87 - 5.11 MIL/uL   Hemoglobin 11.2 (L) 12.0 - 15.0 g/dL   HCT 34.7 (L) 36.0 - 46.0 %   MCV 81.6 78.0 - 100.0 fL   MCH 26.4 26.0 - 34.0 pg   MCHC 32.3 30.0 - 36.0 g/dL   RDW 15.4 11.5 - 15.5 %   Platelets 294 150 - 400 K/uL   Neutrophils Relative % 88 %   Neutro Abs 13.1 (H) 1.7 - 7.7 K/uL   Lymphocytes Relative 10 %   Lymphs Abs 1.4 0.7 - 4.0 K/uL   Monocytes Relative 2 %   Monocytes Absolute 0.4 0.1 - 1.0 K/uL   Eosinophils Relative 0 %   Eosinophils Absolute 0.0 0.0 - 0.7 K/uL   Basophils Relative 0 %   Basophils Absolute 0.0  0.0 - 0.1 K/uL    Comment: Performed at [facility] [address] [id number]  Comprehensive metabolic panel     Status: Abnormal   Collection Time: 02/06/18  6:02 AM  Result Value Ref Range   Sodium 133 (L) 135 - 145 mmol/L   Potassium 3.9 3.5 - 5.1 mmol/L   Chloride 99 (L) 101 - 111 mmol/L   CO2 25 22 - 32 mmol/L   Glucose, Bld 125 (H) 65 - 99 mg/dL   BUN 8 6 - 20 mg/dL   Creatinine, Ser 0.97 0.44 - 1.00 mg/dL   Calcium 8.3 (L) 8.9 - 10.3 mg/dL   Total Protein 6.6 6.5 - 8.1 g/dL   Albumin 2.9 (L) 3.5 - 5.0 g/dL   AST 64 (H) 15 - 41 U/L   ALT 44 14 - 54 U/L   Alkaline Phosphatase 108 38 - 126 U/L   Total Bilirubin 0.6 0.3 - 1.2 mg/dL   GFR calc non Af Amer >60 >60 mL/min   GFR calc Af Amer >60 >60 mL/min    Comment: (NOTE) The eGFR has been calculated using the CKD EPI equation. This calculation has not been validated in all clinical situations. eGFR's persistently <60 mL/min signify possible Chronic Kidney Disease.    Anion gap 9 5 - 15    Comment: Performed at [facility], [address], [place],  [address]   Review of Systems  Constitutional: Negative for fever.  Gastrointestinal: Positive for nausea and vomiting (Yesterday only ). Negative for constipation.  Genitourinary: Positive for dysuria (Pressure and Pain ).  Neurological: Positive for dizziness, syncope, light-headedness and headaches.   Physical Exam   Blood pressure 114/66, pulse (!) 103, temperature 98.1 F (36.7 C), temperature source Oral, resp. rate 20, SpO2 98 %.  Physical Exam  Constitutional: She is oriented to person, place, and time. She appears well-developed and well-nourished. No distress.  HENT:  Head: Normocephalic.  GI: Soft. Normal appearance and bowel sounds are normal. There is tenderness in the right lower quadrant, suprapubic area and left lower quadrant. There is guarding. There is no rigidity, no rebound and no CVA tenderness.  Musculoskeletal:        Left ankle: She exhibits no swelling. Tenderness.  Neurological: She is alert and oriented to person, place, and time. She has normal strength. GCS eye subscore is 4. GCS verbal subscore is 5. GCS motor subscore is 6.  Skin: She is diaphoretic.  Psychiatric: She is slowed.   MAU Course  Procedures  EKG CT, abdomen & pelvis  MDM  UA- Cath Urine  Urine culture  CBC, CMP   LR bolus Toradol 30 mg IV  Fever with elevated WBC, will send patient for CT scan.  Report given to [place] who resumes care of the patient. Patient awaiting CT scan.   Rasch, Artist Pais, NP  Results for orders placed or performed during the hospital encounter of 02/06/18 (from the past 24 hour(s))  Urinalysis, Routine w reflex microscopic     Status: Abnormal   Collection Time: 02/06/18  5:59 AM  Result Value Ref Range   Color, Urine AMBER (A) YELLOW   APPearance CLEAR CLEAR   Specific Gravity, Urine 1.013 1.005 - 1.030   pH 7.0 5.0 - 8.0   Glucose, UA NEGATIVE NEGATIVE mg/dL   Hgb urine dipstick NEGATIVE NEGATIVE   Bilirubin Urine NEGATIVE NEGATIVE   Ketones, ur NEGATIVE NEGATIVE mg/dL   Protein, ur NEGATIVE NEGATIVE mg/dL   Nitrite POSITIVE (A) NEGATIVE   Leukocytes, UA NEGATIVE NEGATIVE   RBC / HPF 0-5 0 - 5  RBC/hpf   WBC, UA 0-5 0 - 5 WBC/hpf   Bacteria, UA NONE SEEN NONE SEEN   Squamous Epithelial / LPF 0-5 (A) NONE SEEN   Mucus PRESENT   CBC with Differential     Status: Abnormal   Collection Time: 02/06/18  6:02 AM  Result Value Ref Range   WBC 14.9 (H) 4.0 - 10.5 K/uL   RBC 4.25 3.87 - 5.11 MIL/uL   Hemoglobin 11.2 (L) 12.0 - 15.0 g/dL   HCT 34.7 (L) 36.0 - 46.0 %   MCV 81.6 78.0 - 100.0 fL   MCH 26.4 26.0 - 34.0 pg   MCHC 32.3 30.0 - 36.0 g/dL   RDW 15.4 11.5 - 15.5 %   Platelets 294 150 - 400 K/uL   Neutrophils Relative % 88 %   Neutro Abs 13.1 (H) 1.7 - 7.7 K/uL   Lymphocytes Relative 10 %   Lymphs Abs 1.4 0.7 - 4.0 K/uL   Monocytes Relative 2 %   Monocytes Absolute  0.4 0.1 - 1.0 K/uL   Eosinophils Relative 0 %   Eosinophils Absolute 0.0 0.0 - 0.7 K/uL   Basophils Relative 0 %   Basophils Absolute 0.0 0.0 - 0.1 K/uL  Comprehensive metabolic panel     Status: Abnormal   Collection Time: 02/06/18  6:02 AM  Result Value Ref Range   Sodium 133 (L) 135 - 145 mmol/L   Potassium 3.9 3.5 - 5.1 mmol/L   Chloride 99 (L) 101 - 111 mmol/L   CO2 25 22 - 32 mmol/L   Glucose, Bld 125 (H) 65 - 99 mg/dL   BUN 8 6 - 20 mg/dL   Creatinine, Ser 0.97 0.44 - 1.00 mg/dL   Calcium 8.3 (L) 8.9 - 10.3 mg/dL   Total Protein 6.6 6.5 - 8.1 g/dL   Albumin 2.9 (L) 3.5 - 5.0 g/dL   AST 64 (H) 15 - 41 U/L   ALT 44 14 - 54 U/L   Alkaline Phosphatase 108 38 - 126 U/L   Total Bilirubin 0.6 0.3 - 1.2 mg/dL   GFR calc non Af Amer >60 >60 mL/min   GFR calc Af Amer >60 >60 mL/min   Anion gap 9 5 - 15   Ct Abdomen Pelvis W Contrast  Result Date: 02/06/2018 CLINICAL DATA:  Status post vaginal hysterectomy with bilateral salpingectomy on 03/05. Lower abdominal pain and dizziness. Fever. EXAM: CT ABDOMEN AND PELVIS WITH CONTRAST TECHNIQUE: Multidetector CT imaging of the abdomen and pelvis was performed using the standard protocol following bolus administration of intravenous contrast. CONTRAST:  134m ISOVUE-300 IOPAMIDOL (ISOVUE-300) INJECTION 61% COMPARISON:  08/03/2017 FINDINGS: Lower chest: Clear lung bases. Normal heart size without pericardial or pleural effusion. Hepatobiliary: Mild hepatomegaly at 18.3 cm craniocaudal. Normal gallbladder, without biliary ductal dilatation. Pancreas: Normal, without mass or ductal dilatation. Spleen: Normal in size, without focal abnormality. Adrenals/Urinary Tract: Normal adrenal glands. Punctate lower pole left renal collecting system calculus. Normal right kidney, without hydronephrosis or hydroureter. Mild bladder wall thickening. Stomach/Bowel: Normal stomach, without wall thickening. Colonic stool burden suggests constipation. Normal terminal  ileum. Normal small bowel. Vascular/Lymphatic: Normal caliber of the aorta and branch vessels. No abdominopelvic adenopathy. Reproductive: Hysterectomy.  No well-defined adnexal mass. Other: Edema and small volume ill-defined complex fluid throughout the pelvis. Ill-defined complex fluid measures on the order of 4.1 x 3.7 cm on image 61/2. No free intraperitoneal air. Musculoskeletal: Degenerative sclerosis about the bilateral sacroiliac joints. IMPRESSION: 1. Status post hysterectomy. Edema and ill-defined complex fluid  within the pelvis, likely due to postoperative chronic hemorrhage/hematoma. No drainable abscess. 2. No bowel obstruction or other acute complication. 3.  Possible constipation. 4. Mild bladder wall thickening could represent cystitis. 5. Left nephrolithiasis. Electronically Signed   By: Abigail Miyamoto M.D.   On: 02/06/2018 10:28   *Consult with Dr. Nilda Riggs @ 1045 - notified of patient's complaints, assessments, lab & CT results, recommended tx plan EKG, Rx Augmentin 875 mg BID x 7 days, transfer to [facility] for evaluation of head and ankle; cleared GYN    Assessment and Plan  Postoperative Pain  Acute cystitis without hematuria - Augmentin 875 mg BID x 7 days  Kidney stone on left side - Rx for Flomax 0.4 mg daily x 4 days  Syncope, unspecified syncope type - EKG - Transfer to [facility]   Injury of left ankle, initial encounter - Transfer to [place], MSN, CNM 02/06/2018 11:19 AM

## 2018-02-06 NOTE — ED Notes (Signed)
ED Provider at bedside. 

## 2018-02-06 NOTE — ED Notes (Signed)
Patient ambulated without difficulty to the restroom 

## 2018-02-06 NOTE — ED Notes (Signed)
Pt ambulated to RR with steady gait

## 2018-02-07 ENCOUNTER — Inpatient Hospital Stay (HOSPITAL_COMMUNITY)
Admission: AD | Admit: 2018-02-07 | Discharge: 2018-02-10 | DRG: 690 | Disposition: A | Discharge status: Home / Self Care | Payer: BLUE CROSS/BLUE SHIELD | Source: Ambulatory Visit | Attending: Obstetrics and Gynecology | Admitting: Obstetrics and Gynecology

## 2018-02-07 ENCOUNTER — Telehealth: Payer: Self-pay | Admitting: *Deleted

## 2018-02-07 ENCOUNTER — Other Ambulatory Visit: Payer: Self-pay

## 2018-02-07 ENCOUNTER — Encounter (HOSPITAL_COMMUNITY): Payer: Self-pay

## 2018-02-07 DIAGNOSIS — A419 Sepsis, unspecified organism: Secondary | ICD-10-CM | POA: Diagnosis not present

## 2018-02-07 DIAGNOSIS — B9620 Unspecified Escherichia coli [E. coli] as the cause of diseases classified elsewhere: Secondary | ICD-10-CM | POA: Diagnosis present

## 2018-02-07 DIAGNOSIS — Z90710 Acquired absence of both cervix and uterus: Secondary | ICD-10-CM | POA: Diagnosis not present

## 2018-02-07 DIAGNOSIS — N12 Tubulo-interstitial nephritis, not specified as acute or chronic: Secondary | ICD-10-CM | POA: Diagnosis present

## 2018-02-07 DIAGNOSIS — I10 Essential (primary) hypertension: Secondary | ICD-10-CM | POA: Diagnosis present

## 2018-02-07 DIAGNOSIS — N200 Calculus of kidney: Secondary | ICD-10-CM

## 2018-02-07 DIAGNOSIS — J45909 Unspecified asthma, uncomplicated: Secondary | ICD-10-CM | POA: Diagnosis present

## 2018-02-07 DIAGNOSIS — B9689 Other specified bacterial agents as the cause of diseases classified elsewhere: Secondary | ICD-10-CM | POA: Diagnosis present

## 2018-02-07 DIAGNOSIS — Z87891 Personal history of nicotine dependence: Secondary | ICD-10-CM

## 2018-02-07 LAB — CBC WITH DIFFERENTIAL/PLATELET
BASOS PCT: 0 %
Basophils Absolute: 0 10*3/uL (ref 0.0–0.1)
EOS ABS: 0.1 10*3/uL (ref 0.0–0.7)
EOS PCT: 0 %
HCT: 36.6 % (ref 36.0–46.0)
HEMOGLOBIN: 11.4 g/dL — AB (ref 12.0–15.0)
Lymphocytes Relative: 12 %
Lymphs Abs: 2.1 10*3/uL (ref 0.7–4.0)
MCH: 25.7 pg — ABNORMAL LOW (ref 26.0–34.0)
MCHC: 31.1 g/dL (ref 30.0–36.0)
MCV: 82.6 fL (ref 78.0–100.0)
MONO ABS: 0.5 10*3/uL (ref 0.1–1.0)
MONOS PCT: 3 %
NEUTROS PCT: 85 %
Neutro Abs: 15.3 10*3/uL — ABNORMAL HIGH (ref 1.7–7.7)
PLATELETS: 301 10*3/uL (ref 150–400)
RBC: 4.43 MIL/uL (ref 3.87–5.11)
RDW: 15.6 % — AB (ref 11.5–15.5)
WBC: 18 10*3/uL — ABNORMAL HIGH (ref 4.0–10.5)

## 2018-02-07 LAB — LACTIC ACID, PLASMA: Lactic Acid, Venous: 1.2 mmol/L (ref 0.5–1.9)

## 2018-02-07 LAB — C DIFFICILE QUICK SCREEN W PCR REFLEX
C DIFFICILE (CDIFF) INTERP: NOT DETECTED
C Diff antigen: NEGATIVE
C Diff toxin: NEGATIVE

## 2018-02-07 LAB — CULTURE, OB URINE
CULTURE: NO GROWTH
SPECIAL REQUESTS: NORMAL

## 2018-02-07 LAB — INFLUENZA PANEL BY PCR (TYPE A & B)
INFLAPCR: NEGATIVE
INFLBPCR: NEGATIVE

## 2018-02-07 MED ORDER — ENOXAPARIN SODIUM 40 MG/0.4ML ~~LOC~~ SOLN
40.0000 mg | SUBCUTANEOUS | Status: DC
  Administered 2018-02-07 – 2018-02-09 (×3): 40 mg via SUBCUTANEOUS
  Filled 2018-02-07 (×3): qty 0.4

## 2018-02-07 MED ORDER — CALCIUM CARBONATE ANTACID 500 MG PO CHEW: 2.0000 | CHEWABLE_TABLET | ORAL | Status: DC | PRN

## 2018-02-07 MED ORDER — HYDROMORPHONE HCL 1 MG/ML IJ SOLN
0.5000 mg | INTRAMUSCULAR | Status: DC | PRN
  Administered 2018-02-07 – 2018-02-09 (×8): 0.5 mg via INTRAVENOUS
  Filled 2018-02-07 (×9): qty 0.5

## 2018-02-07 MED ORDER — DOCUSATE SODIUM 100 MG PO CAPS: 100.0000 mg | ORAL_CAPSULE | Freq: Every day | ORAL | Status: DC

## 2018-02-07 MED ORDER — HYDROMORPHONE HCL 1 MG/ML IJ SOLN
0.5000 mg | Freq: Once | INTRAMUSCULAR | Status: AC
  Administered 2018-02-07: 0.5 mg via INTRAVENOUS
  Filled 2018-02-07: qty 0.5

## 2018-02-07 MED ORDER — PRENATAL MULTIVITAMIN CH: 1.0000 | ORAL_TABLET | Freq: Every day | ORAL | Status: DC

## 2018-02-07 MED ORDER — ZOLPIDEM TARTRATE 5 MG PO TABS: 5.0000 mg | ORAL_TABLET | Freq: Every evening | ORAL | Status: DC | PRN

## 2018-02-07 MED ORDER — ENOXAPARIN SODIUM 40 MG/0.4ML ~~LOC~~ SOLN: 40.0000 mg | SUBCUTANEOUS | Status: DC

## 2018-02-07 MED ORDER — ZOLPIDEM TARTRATE 5 MG PO TABS
5.0000 mg | ORAL_TABLET | Freq: Every evening | ORAL | Status: DC | PRN
  Administered 2018-02-07 – 2018-02-09 (×3): 5 mg via ORAL
  Filled 2018-02-07 (×4): qty 1

## 2018-02-07 MED ORDER — PIPERACILLIN-TAZOBACTAM 3.375 G IVPB
3.3750 g | Freq: Three times a day (TID) | INTRAVENOUS | Status: DC
  Administered 2018-02-07 – 2018-02-10 (×8): 3.375 g via INTRAVENOUS
  Filled 2018-02-07 (×9): qty 50

## 2018-02-07 MED ORDER — ACETAMINOPHEN 325 MG PO TABS
650.0000 mg | ORAL_TABLET | ORAL | Status: DC | PRN
  Administered 2018-02-07 – 2018-02-10 (×9): 650 mg via ORAL
  Filled 2018-02-07 (×10): qty 2

## 2018-02-07 MED ORDER — SODIUM CHLORIDE 0.9 % IV BOLUS (SEPSIS)
1000.0000 mL | Freq: Once | INTRAVENOUS | Status: AC
  Administered 2018-02-07: 1000 mL via INTRAVENOUS

## 2018-02-07 NOTE — H&P (Addendum)
FACULTY PRACTICE ADMISSION HISTORY AND PHYSICAL NOTE   History of Present Illness: Lauren Garza is a 37 y.o. G3P3003 at Unknown admitted for pyelonephritis. Patient is POD#6 from total vaginal hysterectomy with bilateral salpingectomy. She was seen yesterday in [facility]U for dizziness. Blood cultures normal, CT abdomen unremarkable except for left kidney stone, and was started on augmentin for UTI. Patient started to have fever up to 102 deg F at home starting last night. She also admits to diarrhea and abdominal pain that is diffuse. Urine culture from 02/04/18 positive for E.coli.   Patient Active Problem List   Diagnosis Date Noted  . UTI (urinary tract infection) 02/06/2018  . Kidney stone on left side 02/06/2018  . Post-operative state 02/01/2018  . DUB (dysfunctional uterine bleeding) 12/22/2017  . Generalized headaches 10/25/2017  . Fibroid 01/02/2015    Past Medical History:  Diagnosis Date  . Abnormal Pap smear   . Acid reflux   . Asthma   . Bacterial vaginosis   . Chlamydia trachomatis infection 2012  . DUB (dysfunctional uterine bleeding)   . Family history of adverse reaction to anesthesia    mom has n and V past surgery  . Frequent headaches    migraines  . Hypertension     Past Surgical History:  Procedure Laterality Date  . GYNECOLOGIC CRYOSURGERY    . I&D EXTREMITY Right 04/04/2015   Procedure: IRRIGATION AND DEBRIDEMENT RIGHT WRIST;  Surgeon: Leanora Cover, MD;  Location: [facility];  Service: Orthopedics;  Laterality: Right;  . SINUS ENDO WITH FUSION N/A 01/25/2017   Procedure: SINUS ENDO WITH FUSION;  Surgeon: Melida Quitter, MD;  Location: [place];  Service: ENT;  Laterality: N/A;  . TUBAL LIGATION    . VAGINAL HYSTERECTOMY Bilateral 02/01/2018   Procedure: HYSTERECTOMY VAGINAL WITH SALPINGECTOMY;  Surgeon: Chancy Milroy, MD;  Location: [facility];  Service: Gynecology;  Laterality: Bilateral;  . WISDOM TOOTH EXTRACTION    . WRIST SURGERY      OB  History  Gravida Para Term Preterm AB Living  3 3 3  0 0 3  SAB TAB Ectopic Multiple Live Births  0 0 0 0      # Outcome Date GA Lbr Len/2nd Weight Sex Delivery Anes PTL Lv  3 Term      Vag-Spont     2 Term      Vag-Spont     1 Term      Vag-Spont         Social History   Socioeconomic History  . Marital status: Legally Separated    Spouse name: Not on file  . Number of children: Not on file  . Years of education: Not on file  . Highest education level: Not on file  Social Needs  . Financial resource strain: Not on file  . Food insecurity - worry: Not on file  . Food insecurity - inability: Not on file  . Transportation needs - medical: Not on file  . Transportation needs - non-medical: Not on file  Occupational History  . Not on file  Tobacco Use  . Smoking status: Former Research scientist (life sciences)  . Smokeless tobacco: Never Used  . Tobacco comment: rare smoker 9 years ago  Substance and Sexual Activity  . Alcohol use: Yes    Comment: Rare, 1x year  . Drug use: No  . Sexual activity: Yes    Birth control/protection: Surgical  Other Topics Concern  . Not on file  Social History Narrative  .  Not on file    Family History  Problem Relation Age of Onset  . Heart attack Father   . Hypertension Mother   . Diabetes Maternal Grandmother     Allergies  Allergen Reactions  . Clindamycin/Lincomycin Hives  . Dust Mite Extract     Unknown reaction  . Flagyl [Metronidazole Hcl] Hives  . Lincomycin Hcl Hives    Medications Prior to Admission  Medication Sig Dispense Refill Last Dose  . albuterol (PROAIR HFA) 108 (90 Base) MCG/ACT inhaler Inhale 1 puff into the lungs every 4 (four) hours as needed for wheezing or shortness of breath.    Past Week at Unknown time  . Biotin 10000 MCG TABS Take 1 tablet by mouth daily.   Past Week at Unknown time  . budesonide-formoterol (SYMBICORT) 160-4.5 MCG/ACT inhaler Inhale 2 puffs into the lungs 2 (two) times daily.   Past Week at Unknown time  .  cephALEXin (KEFLEX) 500 MG capsule Take 1 capsule (500 mg total) by mouth 3 (three) times daily. 21 capsule 0   . cetirizine (ZYRTEC) 10 MG tablet Take 10 mg by mouth daily.   0 Past Week at Unknown time  . EPINEPHrine 0.3 mg/0.3 mL IJ SOAJ injection Inject 0.3 mg into the muscle once as needed.    Taking  . montelukast (SINGULAIR) 10 MG tablet Take 10 mg by mouth daily.   0 Past Week at Unknown time  . Omega-3 1000 MG CAPS Take 1 capsule by mouth daily.   Past Week at Unknown time  . ondansetron (ZOFRAN ODT) 8 MG disintegrating tablet Take 1 tablet (8 mg total) by mouth every 8 (eight) hours as needed for nausea or vomiting. 10 tablet 0   . oxyCODONE-acetaminophen (PERCOCET/ROXICET) 5-325 MG tablet Take 2 tablets by mouth every 4 (four) hours as needed for severe pain ((when tolerating fluids)). 30 tablet 0 02/05/2018 at Unknown time  . phenazopyridine (PYRIDIUM) 200 MG tablet Take 1 tablet (200 mg total) by mouth 2 (two) times daily. 14 tablet 0 02/05/2018 at Unknown time  . senna (SENOKOT) 8.6 MG TABS tablet Take 1 tablet (8.6 mg total) by mouth 2 (two) times daily. 120 each 0 Past Week at Unknown time  . triamterene-hydrochlorothiazide (DYAZIDE) 37.5-25 MG capsule Take 1 capsule by mouth daily.   02/05/2018 at Unknown time    Review of Systems - General ROS: positive for  - fever Psychological ROS: negative for - anxiety or depression ENT ROS: negative for - headaches or nasal congestion Respiratory ROS: no cough, shortness of breath, or wheezing Cardiovascular ROS: no chest pain or dyspnea on exertion Gastrointestinal ROS: positive for abdominal pain and nausea and diarrhea. negative for vomiting Genito-Urinary ROS: no dysuria, trouble voiding, or hematuria Musculoskeletal ROS: negative for - joint pain or joint swelling Neurological ROS: no TIA or stroke symptoms  Vitals:  BP 123/76 (BP Location: Right Arm)   Pulse (!) 114   Temp (!) 101.8 F (38.8 C) (Oral)   Resp 18   Ht 5\' 2"  (1.575 m)    Wt 98.4 kg (217 lb)   LMP 01/04/2018   SpO2 98%   BMI 39.69 kg/m  Physical Examination: CONSTITUTIONAL: Well-developed, well-nourished female in no acute distress.  HENT:  Normocephalic, atraumatic, External right and left ear normal. Oropharynx is clear and moist EYES: Conjunctivae and EOM are normal. Pupils are equal, round, and reactive to light. No scleral icterus.  NECK: Normal range of motion, supple, no masses SKIN: Skin is warm and dry.  No rash noted. Not diaphoretic. No erythema. No pallor. Simms: Alert and oriented to person, place, and time. Normal reflexes, muscle tone coordination. No cranial nerve deficit noted. PSYCHIATRIC: Normal mood and affect. Normal behavior. Normal judgment and thought content. CARDIOVASCULAR: Normal heart rate noted, regular rhythm RESPIRATORY: Effort and breath sounds normal, no problems with respiration noted ABDOMEN: Soft, diffusely tender to palpation but most apparent around umbilicus with some rebound tenderness MUSCULOSKELETAL: Normal range of motion. No edema. Left sided CVA tenderness to palpation   Labs:  Results for orders placed or performed during the hospital encounter of 02/07/18 (from the past 24 hour(s))  CBC with Differential/Platelet   Collection Time: 02/07/18  4:45 PM  Result Value Ref Range   WBC 18.0 (H) 4.0 - 10.5 K/uL   RBC 4.43 3.87 - 5.11 MIL/uL   Hemoglobin 11.4 (L) 12.0 - 15.0 g/dL   HCT 36.6 36.0 - 46.0 %   MCV 82.6 78.0 - 100.0 fL   MCH 25.7 (L) 26.0 - 34.0 pg   MCHC 31.1 30.0 - 36.0 g/dL   RDW 15.6 (H) 11.5 - 15.5 %   Platelets 301 150 - 400 K/uL   Neutrophils Relative % 85 %   Neutro Abs 15.3 (H) 1.7 - 7.7 K/uL   Lymphocytes Relative 12 %   Lymphs Abs 2.1 0.7 - 4.0 K/uL   Monocytes Relative 3 %   Monocytes Absolute 0.5 0.1 - 1.0 K/uL   Eosinophils Relative 0 %   Eosinophils Absolute 0.1 0.0 - 0.7 K/uL   Basophils Relative 0 %   Basophils Absolute 0.0 0.0 - 0.1 K/uL    Imaging Studies: Dg Ankle  Complete Left  Result Date: 02/06/2018 CLINICAL DATA:  Fall, left ankle pain. EXAM: LEFT ANKLE COMPLETE - 3+ VIEW COMPARISON:  None. FINDINGS: There is no evidence of fracture, dislocation, or joint effusion. There is no evidence of arthropathy or other focal bone abnormality. Soft tissues are unremarkable. IMPRESSION: Negative. Electronically Signed   By: Rolm Baptise M.D.   On: 02/06/2018 12:45   Ct Abdomen Pelvis W Contrast  Result Date: 02/06/2018 CLINICAL DATA:  Status post vaginal hysterectomy with bilateral salpingectomy on 03/05. Lower abdominal pain and dizziness. Fever. EXAM: CT ABDOMEN AND PELVIS WITH CONTRAST TECHNIQUE: Multidetector CT imaging of the abdomen and pelvis was performed using the standard protocol following bolus administration of intravenous contrast. CONTRAST:  15mL ISOVUE-300 IOPAMIDOL (ISOVUE-300) INJECTION 61% COMPARISON:  08/03/2017 FINDINGS: Lower chest: Clear lung bases. Normal heart size without pericardial or pleural effusion. Hepatobiliary: Mild hepatomegaly at 18.3 cm craniocaudal. Normal gallbladder, without biliary ductal dilatation. Pancreas: Normal, without mass or ductal dilatation. Spleen: Normal in size, without focal abnormality. Adrenals/Urinary Tract: Normal adrenal glands. Punctate lower pole left renal collecting system calculus. Normal right kidney, without hydronephrosis or hydroureter. Mild bladder wall thickening. Stomach/Bowel: Normal stomach, without wall thickening. Colonic stool burden suggests constipation. Normal terminal ileum. Normal small bowel. Vascular/Lymphatic: Normal caliber of the aorta and branch vessels. No abdominopelvic adenopathy. Reproductive: Hysterectomy.  No well-defined adnexal mass. Other: Edema and small volume ill-defined complex fluid throughout the pelvis. Ill-defined complex fluid measures on the order of 4.1 x 3.7 cm on image 61/2. No free intraperitoneal air. Musculoskeletal: Degenerative sclerosis about the bilateral  sacroiliac joints. IMPRESSION: 1. Status post hysterectomy. Edema and ill-defined complex fluid within the pelvis, likely due to postoperative chronic hemorrhage/hematoma. No drainable abscess. 2. No bowel obstruction or other acute complication. 3.  Possible constipation. 4. Mild bladder wall thickening could represent cystitis.  5. Left nephrolithiasis. Electronically Signed   By: Abigail Miyamoto M.D.   On: 02/06/2018 10:28     Assessment and Plan: Patient Active Problem List   Diagnosis Date Noted  . Pyelonephritis 02/07/2018  . UTI (urinary tract infection) 02/06/2018  . Kidney stone on left side 02/06/2018  . Post-operative state 02/01/2018  . DUB (dysfunctional uterine bleeding) 12/22/2017  . Generalized headaches 10/25/2017  . Fibroid 01/02/2015   Admit for pyelonephritis. Zosyn for antibiotic coverage. Dilaudid for pain until she can tolerate po. Will get lactic acid and start IV fluids CBC today showed wbc 18, increased from yesterday. Will not repeat CBC or blood cultures.    Dannielle Huh, DO  GYN Attending  Pt well known to me. I performed TVH/BS last week.  Operation and post op course was unremarkable Events from yesterday noted as well.  Pt reports was doing well until Sat evening, at that time had a single episode of N/V. Slept through the night until yesterdays events occurred. She reports started having abd pain, colic like this morning with loose stools and fever. Pt has had 8 loose stools today thus far Labs as noted Exam diffuse abd tenderness no rebound LCVA tenderness Suspect pt's abd pain is GI related. Checking C. Diff. BC negative x 24 hrs. LA normal thus do not think sepsis at this time. + UC with E. Coli and with CVA tenderness pyelonephritis seems most likely. No evidence of obstruction via kidney stone yesterday. Will continue to follow, may need urology follow up as outpt. Zosyn for antibiotic coverage. Repeat labs in AM. POC reviewed with pt and family.  Verbalized understanding and agreement.

## 2018-02-07 NOTE — Telephone Encounter (Signed)
Pt called to office stating she is post hyst x  1 week and is now running a 101.5 temp. Pt stated she was seen at [facility] yesterday and given abx for UTI and kidney infection.    Pt made aware will have provider in office look at notes and determine plan of care. Reviewed with Dr Elly Modena- she reviewed notes and would like pt to be seen at [facility] for eval since now running temp.  Pt made aware and states she will return to [facility] to be seen. Pt advised to call office if any other concerns.

## 2018-02-07 NOTE — MAU Note (Signed)
Pt returns with complaint of temp 101.5, s/p hyst one week ago

## 2018-02-08 ENCOUNTER — Inpatient Hospital Stay (HOSPITAL_COMMUNITY): Payer: BLUE CROSS/BLUE SHIELD

## 2018-02-08 DIAGNOSIS — N12 Tubulo-interstitial nephritis, not specified as acute or chronic: Principal | ICD-10-CM

## 2018-02-08 DIAGNOSIS — A419 Sepsis, unspecified organism: Secondary | ICD-10-CM

## 2018-02-08 LAB — CBC WITH DIFFERENTIAL/PLATELET
BASOS PCT: 0 %
Basophils Absolute: 0 10*3/uL (ref 0.0–0.1)
Basophils Absolute: 0 10*3/uL (ref 0.0–0.1)
Basophils Relative: 0 %
EOS PCT: 1 %
Eosinophils Absolute: 0.2 10*3/uL (ref 0.0–0.7)
Eosinophils Absolute: 0.1 10*3/uL (ref 0.0–0.7)
Eosinophils Relative: 1 %
HCT: 32.4 % — ABNORMAL LOW (ref 36.0–46.0)
HEMATOCRIT: 32.9 % — AB (ref 36.0–46.0)
Hemoglobin: 10.2 g/dL — ABNORMAL LOW (ref 12.0–15.0)
Hemoglobin: 10.1 g/dL — ABNORMAL LOW (ref 12.0–15.0)
LYMPHS ABS: 1.7 10*3/uL (ref 0.7–4.0)
LYMPHS PCT: 10 %
Lymphocytes Relative: 11 %
Lymphs Abs: 1.8 10*3/uL (ref 0.7–4.0)
MCH: 25.7 pg — ABNORMAL LOW (ref 26.0–34.0)
MCH: 26 pg (ref 26.0–34.0)
MCHC: 31 g/dL (ref 30.0–36.0)
MCHC: 31.2 g/dL (ref 30.0–36.0)
MCV: 82.9 fL (ref 78.0–100.0)
MCV: 83.3 fL (ref 78.0–100.0)
MONO ABS: 0.7 10*3/uL (ref 0.1–1.0)
MONOS PCT: 4 %
MONOS PCT: 5 %
Monocytes Absolute: 0.8 10*3/uL (ref 0.1–1.0)
NEUTROS ABS: 14.6 10*3/uL — AB (ref 1.7–7.7)
Neutro Abs: 13.9 10*3/uL — ABNORMAL HIGH (ref 1.7–7.7)
Neutrophils Relative %: 84 %
Neutrophils Relative %: 84 %
PLATELETS: 297 10*3/uL (ref 150–400)
Platelets: 292 10*3/uL (ref 150–400)
RBC: 3.97 MIL/uL (ref 3.87–5.11)
RBC: 3.89 MIL/uL (ref 3.87–5.11)
RDW: 15.5 % (ref 11.5–15.5)
RDW: 15.8 % — AB (ref 11.5–15.5)
WBC: 17.3 10*3/uL — ABNORMAL HIGH (ref 4.0–10.5)
WBC: 16.6 10*3/uL — ABNORMAL HIGH (ref 4.0–10.5)

## 2018-02-08 LAB — LACTIC ACID, PLASMA
LACTIC ACID, VENOUS: 1 mmol/L (ref 0.5–1.9)
Lactic Acid, Venous: 0.8 mmol/L (ref 0.5–1.9)

## 2018-02-08 LAB — BASIC METABOLIC PANEL
Anion gap: 8 (ref 5–15)
BUN: 7 mg/dL (ref 6–20)
CALCIUM: 8.1 mg/dL — AB (ref 8.9–10.3)
CO2: 23 mmol/L (ref 22–32)
CREATININE: 0.81 mg/dL (ref 0.44–1.00)
Chloride: 103 mmol/L (ref 101–111)
GFR calc non Af Amer: >60 mL/min (ref >60)
GLUCOSE: 135 mg/dL — AB (ref 65–99)
Potassium: 3.3 mmol/L — ABNORMAL LOW (ref 3.5–5.1)
Sodium: 134 mmol/L — ABNORMAL LOW (ref 135–145)

## 2018-02-08 MED ORDER — ALUM & MAG HYDROXIDE-SIMETH 200-200-20 MG/5ML PO SUSP: 30.0000 mL | Freq: Four times a day (QID) | ORAL | Status: DC | PRN

## 2018-02-08 MED ORDER — SODIUM CHLORIDE 0.9 % IV BOLUS (SEPSIS)
1000.0000 mL | Freq: Once | INTRAVENOUS | Status: AC
  Administered 2018-02-08: 1000 mL via INTRAVENOUS

## 2018-02-08 MED ORDER — SODIUM CHLORIDE 0.9 % IV BOLUS (SEPSIS): 1000.0000 mL | Freq: Once | INTRAVENOUS | Status: DC

## 2018-02-08 MED ORDER — ONDANSETRON HCL 4 MG/2ML IJ SOLN
4.0000 mg | Freq: Four times a day (QID) | INTRAMUSCULAR | Status: DC | PRN
  Administered 2018-02-08: 4 mg via INTRAVENOUS
  Filled 2018-02-08: qty 2

## 2018-02-08 MED ORDER — MOMETASONE FURO-FORMOTEROL FUM 200-5 MCG/ACT IN AERO
2.0000 | INHALATION_SPRAY | Freq: Two times a day (BID) | RESPIRATORY_TRACT | Status: DC
  Administered 2018-02-09 – 2018-02-10 (×2): 2 via RESPIRATORY_TRACT

## 2018-02-08 MED ORDER — SODIUM CHLORIDE 0.9 % IV BOLUS (SEPSIS): 1000.0000 mL | Freq: Once | INTRAVENOUS | Status: AC

## 2018-02-08 NOTE — Progress Notes (Signed)
[facility] OB/GYN Attending Note  Subjective:  Called to evaluate patient with increased pulse and continued fevers, also increased RR all concerning for sepsis. Patient report having colicky abdominal pain especially around her umbilicus.  Feels very weak and tired.   Admitted on 02/07/2018 for Pyelonephritis.    Objective:  Blood pressure 121/75, pulse (!) 133, temperature (!) 100.9 F (38.3 C), temperature source Oral, resp. rate (!) 24, height 5\' 2"  (1.575 m), weight 217 lb (98.4 kg), last menstrual period 01/04/2018, SpO2 97 %.  Patient Vitals for the past 24 hrs:  BP Temp Temp src Pulse Resp SpO2 Height Weight  02/08/18 1217 121/75 (!) 100.9 F (38.3 C) Oral (!) 133 (!) 24 97 % - -  02/08/18 0956 - (!) 101.9 F (38.8 C) - - - - - -  02/08/18 0817 112/71 98.9 F (37.2 C) Oral (!) 111 18 98 % - -  02/08/18 0720 - 100.2 F (37.9 C) - - - - - -  02/08/18 0605 117/65 99 F (37.2 C) Oral (!) 106 20 98 % - -  02/08/18 0151 128/78 99.5 F (37.5 C) Oral (!) 110 18 98 % - -  02/07/18 2158 - 98.7 F (37.1 C) - - - - - -  02/07/18 2025 - 100.1 F (37.8 C) Oral - - - - -  02/07/18 1856 127/74 (!) 101.8 F (38.8 C) Oral (!) 115 20 96 % - -  02/07/18 1854 - - - - - 96 % - -  02/07/18 1701 - - - (!) 114 - 98 % - -  02/07/18 1634 123/76 (!) 101.8 F (38.8 C) Oral (!) 112 18 100 % 5\' 2"  (1.575 m) 217 lb (98.4 kg)    Gen: In some pain HENT: Normocephalic, atraumatic Lungs:CTAB, normal respiratory effort Heart: Elevated rate noted, regular rhythm Abdomen: soft, +BS, +point moderate tenderness around umbilical region Cervix: Deferred Ext: 2+ DTRs, no edema, no cyanosis, negative Homan's sign  Assessment & Plan:  37 y.o. [id number] admitted for pyelonephritis s/p TVH on 02/01/18; now with signs and symptoms consistent with sepsis.  Sepsis protocol initiated, NS boluses ongoing, awaiting lactic acid levels.  Continue Zosyn for now.  [place] notified of patient. If patient  continues to get worse, she may need transfer to [facility] or [facility]; patient and family are aware of this plan.   Verita Schneiders, MD, [place] for [employer], [place]

## 2018-02-08 NOTE — Progress Notes (Signed)
HD # 1 POD # 7 TVH/BS Pyelonephritis/AGE  Subjective: Patient reports feeling a little better. Still with colic like abd pain. No diarrhea since last night.   No N/V. Objective: Tm 101.8 Tc 100.2  Lungs clear Heart RRR Abd soft hyperactive BS diffuse tenderness less from yesterday Decreased L CVA tenderness Ext Non tender  Assessment/Plan: Pyelonephritis, UC E. Coli AGE, C diff neg POD # 7 TVH/BS  Improved some since admission. Fever curve trending down as is WBC. Continue with Zosyn and clear liquid diet.    LOS: 1 day    Lauren Garza 02/08/2018, 7:58 AM

## 2018-02-08 NOTE — Plan of Care (Signed)
  Progressing Clinical Measurements: Ability to maintain clinical measurements within normal limits will improve 02/08/2018 0213 - Progressing by Mikey College, RN Will remain free from infection 02/08/2018 0213 - Progressing by Mikey College, RN Diagnostic test results will improve 02/08/2018 0213 - Progressing by Mikey College, RN Nutrition: Adequate nutrition will be maintained 02/08/2018 0213 - Progressing by Mikey [employer], RN

## 2018-02-09 LAB — CBC WITH DIFFERENTIAL/PLATELET
Basophils Absolute: 0 10*3/uL (ref 0.0–0.1)
Basophils Relative: 0 %
EOS ABS: 0.4 10*3/uL (ref 0.0–0.7)
Eosinophils Relative: 3 %
HCT: 32.3 % — ABNORMAL LOW (ref 36.0–46.0)
HEMOGLOBIN: 10.1 g/dL — AB (ref 12.0–15.0)
LYMPHS ABS: 1.4 10*3/uL (ref 0.7–4.0)
LYMPHS PCT: 11 %
MCH: 25.8 pg — AB (ref 26.0–34.0)
MCHC: 31.3 g/dL (ref 30.0–36.0)
MCV: 82.6 fL (ref 78.0–100.0)
Monocytes Absolute: 0.6 10*3/uL (ref 0.1–1.0)
Monocytes Relative: 5 %
NEUTROS ABS: 10.2 10*3/uL — AB (ref 1.7–7.7)
NEUTROS PCT: 81 %
Platelets: 353 10*3/uL (ref 150–400)
RBC: 3.91 MIL/uL (ref 3.87–5.11)
RDW: 15.8 % — ABNORMAL HIGH (ref 11.5–15.5)
WBC: 12.6 10*3/uL — AB (ref 4.0–10.5)

## 2018-02-09 LAB — C DIFFICILE QUICK SCREEN W PCR REFLEX
C DIFFICILE (CDIFF) INTERP: NOT DETECTED
C Diff antigen: NEGATIVE
C Diff toxin: NEGATIVE

## 2018-02-09 NOTE — Progress Notes (Signed)
  HD # 2  POD # 8 TVH/BS Pyelonephritis AGE  Subjective: Patient reports feeling better today. Wants something to eat. Had 8-10 loose stool yesterday. 3-4 today so far. Still has colic like abd pain  Events of yesterday noted   Objective: I have reviewed patient's vital signs.  Lungs clear  Heart RRR Abd soft + bs not as hyperactive, decreased tenderness, no rebound CVA tenderness decreased Ext non tender   Assessment/Plan: As above  Sepsis w/u remains negative. WBC decreasing. Will advance diet. Recheck C. Diff. Labs in AM. Continue with Zosyn Pyelonephritis is improving. Feel that most of pt's Sx are related to AGE.   LOS: 2 days    Lauren Garza 02/09/2018, 4:59 PM

## 2018-02-10 ENCOUNTER — Ambulatory Visit: Payer: Medicaid Other | Admitting: Neurology

## 2018-02-10 LAB — COMPREHENSIVE METABOLIC PANEL
ALBUMIN: 2.4 g/dL — AB (ref 3.5–5.0)
ALK PHOS: 165 U/L — AB (ref 38–126)
ALT: 68 U/L — AB (ref 14–54)
ANION GAP: 8 (ref 5–15)
AST: 81 U/L — AB (ref 15–41)
BUN: 6 mg/dL (ref 6–20)
CALCIUM: 8.1 mg/dL — AB (ref 8.9–10.3)
CO2: 24 mmol/L (ref 22–32)
CREATININE: 0.74 mg/dL (ref 0.44–1.00)
Chloride: 104 mmol/L (ref 101–111)
GFR calc Af Amer: >60 mL/min (ref >60)
GFR calc non Af Amer: >60 mL/min (ref >60)
GLUCOSE: 100 mg/dL — AB (ref 65–99)
Potassium: 3.1 mmol/L — ABNORMAL LOW (ref 3.5–5.1)
SODIUM: 136 mmol/L (ref 135–145)
Total Bilirubin: 1.2 mg/dL (ref 0.3–1.2)
Total Protein: 6.5 g/dL (ref 6.5–8.1)

## 2018-02-10 LAB — CBC WITH DIFFERENTIAL/PLATELET
Basophils Absolute: 0 10*3/uL (ref 0.0–0.1)
Basophils Relative: 0 %
EOS PCT: 6 %
Eosinophils Absolute: 0.7 10*3/uL (ref 0.0–0.7)
HEMATOCRIT: 29.3 % — AB (ref 36.0–46.0)
Hemoglobin: 9.3 g/dL — ABNORMAL LOW (ref 12.0–15.0)
Lymphocytes Relative: 19 %
Lymphs Abs: 2 10*3/uL (ref 0.7–4.0)
MCH: 26 pg (ref 26.0–34.0)
MCHC: 31.7 g/dL (ref 30.0–36.0)
MCV: 81.8 fL (ref 78.0–100.0)
MONO ABS: 0.4 10*3/uL (ref 0.1–1.0)
MONOS PCT: 4 %
NEUTROS ABS: 7.7 10*3/uL (ref 1.7–7.7)
Neutrophils Relative %: 71 %
PLATELETS: 370 10*3/uL (ref 150–400)
RBC: 3.58 MIL/uL — ABNORMAL LOW (ref 3.87–5.11)
RDW: 15.4 % (ref 11.5–15.5)
WBC: 10.7 10*3/uL — ABNORMAL HIGH (ref 4.0–10.5)

## 2018-02-10 NOTE — Progress Notes (Signed)
Pt out in wheelchair  Teaching complete   

## 2018-02-10 NOTE — Discharge Instructions (Signed)

## 2018-02-10 NOTE — Discharge Summary (Signed)
Physician Discharge Summary  Patient ID: Lauren Garza MRN: [id number] DOB/AGE: Jan 31, 1981 37 y.o.  Admit date: 02/07/2018 Discharge date: 02/10/2018  Admission Diagnoses: Post op state Pyelonephritis, AGE and kidney stone  Discharge Diagnoses:  Principal Problem:   Pyelonephritis Active Problems:   S/P vaginal hysterectomy   Kidney stone on left side   Discharged Condition: good  Hospital Course: Pt was admitted with above DX. It appeared pt had 2 principle issues. Pyelonephritis and AGE. She received IV antibiotics for her pyelonephritis and responded well to this. She has resolution of her CVA tenderness, fever and elevated WBC. The AGE slowly improved with supportive care. W/U for C. Diff was negative  X 2. At time of discharge she was have soft formed stool, min abd pain and tolerating diet. As of noted she did have slight elevated LFT's which I suspect is related to her AGE. We will follow these up on an outpt basis .  Felt pt was amendable for discharge for home. Discharge medications and follow up reviewed with pt.   Consults: None  Significant Diagnostic Studies: labs:   Treatments: IV hydration and antibiotics: Zosyn  Discharge Exam: Blood pressure 112/69, pulse (!) 102, temperature 98.7 F (37.1 C), temperature source Oral, resp. rate 18, height 5\' 2"  (1.575 m), weight 98.4 kg (217 lb), last menstrual period 01/04/2018, SpO2 100 %. Lungs clear Heart RRR Abd soft + bs min tenderness, no rebound No CVA tenderness Ext non tender  Disposition: 01-Home or Self Care  Discharge Instructions    Call MD for:  difficulty breathing, headache or visual disturbances   Complete by:  As directed    Call MD for:  extreme fatigue   Complete by:  As directed    Call MD for:  hives   Complete by:  As directed    Call MD for:  persistant dizziness or light-headedness   Complete by:  As directed    Call MD for:  persistant nausea and vomiting   Complete by:  As directed     Call MD for:  redness, tenderness, or signs of infection (pain, swelling, redness, odor or green/yellow discharge around incision site)   Complete by:  As directed    Call MD for:  severe uncontrolled pain   Complete by:  As directed    Call MD for:  temperature >100.4   Complete by:  As directed    Diet - low sodium heart healthy   Complete by:  As directed    Increase activity slowly   Complete by:  As directed      Allergies as of 02/10/2018      Reactions   Clindamycin/lincomycin Hives   Dust Mite Extract    Unknown reaction   Flagyl [metronidazole Hcl] Hives   Lincomycin Hcl Hives      Medication List    STOP taking these medications   phenazopyridine 200 MG tablet Commonly known as:  PYRIDIUM   senna 8.6 MG Tabs tablet Commonly known as:  SENOKOT     TAKE these medications   Biotin 10000 MCG Tabs Take 1 tablet by mouth daily.   budesonide-formoterol 160-4.5 MCG/ACT inhaler Commonly known as:  SYMBICORT Inhale 2 puffs into the lungs 2 (two) times daily.   cephALEXin 500 MG capsule Commonly known as:  KEFLEX Take 1 capsule (500 mg total) by mouth 3 (three) times daily.   cetirizine 10 MG tablet Commonly known as:  ZYRTEC Take 10 mg by mouth daily.   EPINEPHrine  0.3 mg/0.3 mL Soaj injection Commonly known as:  EPI-PEN Inject 0.3 mg into the muscle once as needed.   montelukast 10 MG tablet Commonly known as:  SINGULAIR Take 10 mg by mouth daily.   Omega-3 1000 MG Caps Take 1 capsule by mouth daily.   ondansetron 8 MG disintegrating tablet Commonly known as:  ZOFRAN ODT Take 1 tablet (8 mg total) by mouth every 8 (eight) hours as needed for nausea or vomiting.   oxyCODONE-acetaminophen 5-325 MG tablet Commonly known as:  PERCOCET/ROXICET Take 2 tablets by mouth every 4 (four) hours as needed for severe pain ((when tolerating fluids)).   PROAIR HFA 108 (90 Base) MCG/ACT inhaler Generic drug:  albuterol Inhale 1 puff into the lungs every 4 (four)  hours as needed for wheezing or shortness of breath.   triamterene-hydrochlorothiazide 37.5-25 MG capsule Commonly known as:  DYAZIDE Take 1 capsule by mouth daily.      Follow-up Information    [place] Follow up.   Why:  Pt already has follow up appt Contact information: [address] [place] [address] [phone]          Signed: Chancy Milroy 02/10/2018, 8:17 AM

## 2018-02-11 LAB — CULTURE, BLOOD (ROUTINE X 2)
CULTURE: NO GROWTH
Culture: NO GROWTH
Special Requests: ADEQUATE
Special Requests: ADEQUATE

## 2018-02-17 ENCOUNTER — Encounter: Payer: Self-pay | Admitting: Obstetrics & Gynecology

## 2018-02-17 ENCOUNTER — Ambulatory Visit (INDEPENDENT_AMBULATORY_CARE_PROVIDER_SITE_OTHER): Payer: BLUE CROSS/BLUE SHIELD | Admitting: Obstetrics & Gynecology

## 2018-02-17 ENCOUNTER — Other Ambulatory Visit: Payer: Self-pay

## 2018-02-17 VITALS — BP 110/73 | HR 96 | Ht 62.0 in | Wt 216.7 lb

## 2018-02-17 DIAGNOSIS — R945 Abnormal results of liver function studies: Secondary | ICD-10-CM

## 2018-02-17 DIAGNOSIS — R7989 Other specified abnormal findings of blood chemistry: Secondary | ICD-10-CM

## 2018-02-17 DIAGNOSIS — Z90710 Acquired absence of both cervix and uterus: Secondary | ICD-10-CM

## 2018-02-17 DIAGNOSIS — Z9889 Other specified postprocedural states: Secondary | ICD-10-CM

## 2018-02-17 DIAGNOSIS — Z09 Encounter for follow-up examination after completed treatment for conditions other than malignant neoplasm: Secondary | ICD-10-CM

## 2018-02-17 DIAGNOSIS — N12 Tubulo-interstitial nephritis, not specified as acute or chronic: Secondary | ICD-10-CM

## 2018-02-17 NOTE — Patient Instructions (Signed)
Return to clinic for any scheduled appointments or for any gynecologic concerns as needed.   

## 2018-02-17 NOTE — Progress Notes (Signed)
GYNECOLOGY OFFICE POST-OP VISIT NOTE  History:  37 y.o. [id number] here today for follow up after recent re-admission for pyelonephritis, gastroenteritis after TVH on 02/01/18.  She is doing well, no acute concerns. Pain controlled on Ibuprofen.  Having mild spotting. She denies any abnormal vaginal discharge, pelvic pain or other concerns.   Past Medical History:  Diagnosis Date  . Abnormal Pap smear   . Acid reflux   . Asthma   . Bacterial vaginosis   . Chlamydia trachomatis infection 2012  . DUB (dysfunctional uterine bleeding)   . Family history of adverse reaction to anesthesia    mom has n and V past surgery  . Frequent headaches    migraines  . Hypertension     Past Surgical History:  Procedure Laterality Date  . GYNECOLOGIC CRYOSURGERY    . I&D EXTREMITY Right 04/04/2015   Procedure: IRRIGATION AND DEBRIDEMENT RIGHT WRIST;  Surgeon: Leanora Cover, MD;  Location: [facility];  Service: Orthopedics;  Laterality: Right;  . SINUS ENDO WITH FUSION N/A 01/25/2017   Procedure: SINUS ENDO WITH FUSION;  Surgeon: Melida Quitter, MD;  Location: [facility];  Service: ENT;  Laterality: N/A;  . TUBAL LIGATION    . VAGINAL HYSTERECTOMY Bilateral 02/01/2018   Procedure: HYSTERECTOMY VAGINAL WITH SALPINGECTOMY;  Surgeon: Chancy Milroy, MD;  Location: [facility];  Service: Gynecology;  Laterality: Bilateral;  . WISDOM TOOTH EXTRACTION    . WRIST SURGERY      The following portions of the patient's history were reviewed and updated as appropriate: allergies, current medications, past family history, past medical history, past social history, past surgical history and problem list.    Review of Systems:  Pertinent items noted in HPI and remainder of comprehensive ROS otherwise negative.  Objective:  Physical Exam BP 110/73   Pulse 96   Ht 5\' 2"  (1.575 m)   Wt 216 lb 11.2 oz (98.3 kg)   LMP 02/11/2018 (Exact Date)   BMI 39.63 kg/m  CONSTITUTIONAL: Well-developed, well-nourished  female in no acute distress.  HENT:  Normocephalic, atraumatic. External right and left ear normal. Oropharynx is clear and moist EYES: Conjunctivae and EOM are normal. Pupils are equal, round, and reactive to light. No scleral icterus.  NECK: Normal range of motion, supple, no masses SKIN: Skin is warm and dry. No rash noted. Not diaphoretic. No erythema. No pallor. NEUROLOGIC: Alert and oriented to person, place, and time. Normal reflexes, muscle tone coordination. No cranial nerve deficit noted. PSYCHIATRIC: Normal mood and affect. Normal behavior. Normal judgment and thought content. CARDIOVASCULAR: Normal heart rate noted RESPIRATORY: Effort and breath sounds normal, no problems with respiration noted ABDOMEN: Soft, no distention noted.  Mild LLQ tenderness. BACK: No CVAT PELVIC: Deferred MUSCULOSKELETAL: Normal range of motion. No edema noted.  Labs and Imaging Dg Ankle Complete Left  Result Date: 02/06/2018 CLINICAL DATA:  Fall, left ankle pain. EXAM: LEFT ANKLE COMPLETE - 3+ VIEW COMPARISON:  None. FINDINGS: There is no evidence of fracture, dislocation, or joint effusion. There is no evidence of arthropathy or other focal bone abnormality. Soft tissues are unremarkable. IMPRESSION: Negative. Electronically Signed   By: Rolm Baptise M.D.   On: 02/06/2018 12:45   Ct Abdomen Pelvis W Contrast  Result Date: 02/06/2018 CLINICAL DATA:  Status post vaginal hysterectomy with bilateral salpingectomy on 03/05. Lower abdominal pain and dizziness. Fever. EXAM: CT ABDOMEN AND PELVIS WITH CONTRAST TECHNIQUE: Multidetector CT imaging of the abdomen and pelvis was performed using the standard protocol  following bolus administration of intravenous contrast. CONTRAST:  188mL ISOVUE-300 IOPAMIDOL (ISOVUE-300) INJECTION 61% COMPARISON:  08/03/2017 FINDINGS: Lower chest: Clear lung bases. Normal heart size without pericardial or pleural effusion. Hepatobiliary: Mild hepatomegaly at 18.3 cm craniocaudal.  Normal gallbladder, without biliary ductal dilatation. Pancreas: Normal, without mass or ductal dilatation. Spleen: Normal in size, without focal abnormality. Adrenals/Urinary Tract: Normal adrenal glands. Punctate lower pole left renal collecting system calculus. Normal right kidney, without hydronephrosis or hydroureter. Mild bladder wall thickening. Stomach/Bowel: Normal stomach, without wall thickening. Colonic stool burden suggests constipation. Normal terminal ileum. Normal small bowel. Vascular/Lymphatic: Normal caliber of the aorta and branch vessels. No abdominopelvic adenopathy. Reproductive: Hysterectomy.  No well-defined adnexal mass. Other: Edema and small volume ill-defined complex fluid throughout the pelvis. Ill-defined complex fluid measures on the order of 4.1 x 3.7 cm on image 61/2. No free intraperitoneal air. Musculoskeletal: Degenerative sclerosis about the bilateral sacroiliac joints. IMPRESSION: 1. Status post hysterectomy. Edema and ill-defined complex fluid within the pelvis, likely due to postoperative chronic hemorrhage/hematoma. No drainable abscess. 2. No bowel obstruction or other acute complication. 3.  Possible constipation. 4. Mild bladder wall thickening could represent cystitis. 5. Left nephrolithiasis. Electronically Signed   By: Abigail Miyamoto M.D.   On: 02/06/2018 10:28   Dg Chest Port 1 View  Result Date: 02/08/2018 CLINICAL DATA:  Sepsis EXAM: PORTABLE CHEST 1 VIEW COMPARISON:  09/25/2015 chest radiograph. FINDINGS: Stable cardiomediastinal silhouette with normal heart size. No pneumothorax. No pleural effusion. Lung volumes are slightly low. No overt pulmonary edema. No acute consolidative airspace disease. IMPRESSION: Slightly low lung volumes.  No active cardiopulmonary disease. Electronically Signed   By: Ilona Sorrel M.D.   On: 02/08/2018 14:12    Assessment & Plan:  1. Pyelonephritis Resolved, no issues  2. Elevated LFTs Likely secondary to gastroenteritis.  Will follow up.  - Comprehensive metabolic panel  3. Postop check 4. S/P vaginal hysterectomy Doing well.  Continue pelvic rest instructed. Follow up for pelvic exam in 3 weeks as scheduled.   Routine preventative health maintenance measures emphasized. Please refer to After Visit Summary for other counseling recommendations.   Return in about 3 weeks (around 03/10/2018) for Postoperative exam with Dr. Rip Harbour.   Verita Schneiders, MD, [place] for [employer], [place]

## 2018-02-17 NOTE — Progress Notes (Signed)
Post Op FU. Reports no problems today.

## 2018-02-18 LAB — COMPREHENSIVE METABOLIC PANEL
ALBUMIN: 3.8 g/dL (ref 3.5–5.5)
ALT: 41 IU/L — ABNORMAL HIGH (ref 0–32)
AST: 19 IU/L (ref 0–40)
Albumin/Globulin Ratio: 1 — ABNORMAL LOW (ref 1.2–2.2)
Alkaline Phosphatase: 172 IU/L — ABNORMAL HIGH (ref 39–117)
BUN / CREAT RATIO: 13 (ref 9–23)
BUN: 11 mg/dL (ref 6–20)
Bilirubin Total: <0.2 mg/dL (ref 0.0–1.2)
CALCIUM: 9.6 mg/dL (ref 8.7–10.2)
CO2: 25 mmol/L (ref 20–29)
CREATININE: 0.87 mg/dL (ref 0.57–1.00)
Chloride: 100 mmol/L (ref 96–106)
GFR calc Af Amer: 101 mL/min/{1.73_m2} (ref >59)
GFR, EST NON AFRICAN AMERICAN: 87 mL/min/{1.73_m2} (ref >59)
GLOBULIN, TOTAL: 3.7 g/dL (ref 1.5–4.5)
Glucose: 76 mg/dL (ref 65–99)
Potassium: 4.9 mmol/L (ref 3.5–5.2)
Sodium: 143 mmol/L (ref 134–144)
TOTAL PROTEIN: 7.5 g/dL (ref 6.0–8.5)

## 2018-02-21 NOTE — ED Provider Notes (Signed)
[facility] EMERGENCY DEPARTMENT Provider Note   CSN: [id number] Arrival date & time: 02/06/18  0518     History   Chief Complaint Chief Complaint  Patient presents with  . Abdominal Pain  . Fall    HPI Lauren Garza is a 37 y.o. female.  HPI Patient is a 37 year old female who presents to the emergency department as a transfer from the maternity assessment unit.  The patient underwent recent hysterectomy and presented there after 24 hours of nausea vomiting and a syncopal episode which occurred today while in the kitchen.  She was standing up felt lightheaded and the next thing she knew she was on the ground.  She had no preceding chest pain or palpitations.  She has had nausea vomiting and urinary frequency of the past 24 hours.  She was found to have a urinary tract infection at the maternity assessment unit but given her possible head injury and mild headache at this time they were concerned and sent her to the emergency department for evaluation of head injury.  No antibiotics were given for her urinary tract infection.  At this time patient denies significant headache.  She denies neck pain.  She denies weakness of her arms or legs.  No chest pain.  No preceding palpitations.  She reports feeling weak and nauseated.  She is a decreased oral intake over the past 24 hours.     Past Medical History:  Diagnosis Date  . Abnormal Pap smear   . Acid reflux   . Asthma   . Bacterial vaginosis   . Chlamydia trachomatis infection 2012  . DUB (dysfunctional uterine bleeding)   . Family history of adverse reaction to anesthesia    mom has n and V past surgery  . Frequent headaches    migraines  . Hypertension     Patient Active Problem List   Diagnosis Date Noted  . Pyelonephritis 02/07/2018  . Kidney stone on left side 02/06/2018  . S/P vaginal hysterectomy 02/01/2018  . Generalized headaches 10/25/2017    Past Surgical History:  Procedure Laterality  Date  . GYNECOLOGIC CRYOSURGERY    . I&D EXTREMITY Right 04/04/2015   Procedure: IRRIGATION AND DEBRIDEMENT RIGHT WRIST;  Surgeon: Leanora Cover, MD;  Location: [facility];  Service: Orthopedics;  Laterality: Right;  . SINUS ENDO WITH FUSION N/A 01/25/2017   Procedure: SINUS ENDO WITH FUSION;  Surgeon: Melida Quitter, MD;  Location: [facility];  Service: ENT;  Laterality: N/A;  . TUBAL LIGATION    . VAGINAL HYSTERECTOMY Bilateral 02/01/2018   Procedure: HYSTERECTOMY VAGINAL WITH SALPINGECTOMY;  Surgeon: Chancy Milroy, MD;  Location: [facility];  Service: Gynecology;  Laterality: Bilateral;  . WISDOM TOOTH EXTRACTION    . WRIST SURGERY       OB History    Gravida  3   Para  3   Term  3   Preterm  0   AB  0   Living  3     SAB  0   TAB  0   Ectopic  0   Multiple  0   Live Births               Home Medications    Prior to Admission medications   Medication Sig Start Date End Date Taking? Authorizing Provider  albuterol (PROAIR HFA) 108 (90 Base) MCG/ACT inhaler Inhale 1 puff into the lungs every 4 (four) hours as needed for wheezing or shortness of  breath.    Yes [provider]  Biotin 10000 MCG TABS Take 1 tablet by mouth daily.   Yes [provider]  budesonide-formoterol (SYMBICORT) 160-4.5 MCG/ACT inhaler Inhale 2 puffs into the lungs 2 (two) times daily.   Yes [provider]  cetirizine (ZYRTEC) 10 MG tablet Take 10 mg by mouth daily.  06/09/17  Yes [provider]  montelukast (SINGULAIR) 10 MG tablet Take 10 mg by mouth daily.  04/27/17  Yes [provider]  Omega-3 1000 MG CAPS Take 1 capsule by mouth daily.   Yes [provider]  oxyCODONE-acetaminophen (PERCOCET/ROXICET) 5-325 MG tablet Take 2 tablets by mouth every 4 (four) hours as needed for severe pain ((when tolerating fluids)). 02/02/18  Yes Chancy Milroy, MD  triamterene-hydrochlorothiazide (DYAZIDE) 37.5-25 MG capsule Take 1 capsule by mouth  daily.   Yes [provider]  cephALEXin (KEFLEX) 500 MG capsule Take 1 capsule (500 mg total) by mouth 3 (three) times daily. 02/06/18   Jola Schmidt, MD  EPINEPHrine 0.3 mg/0.3 mL IJ SOAJ injection Inject 0.3 mg into the muscle once as needed.     [provider]  ondansetron (ZOFRAN ODT) 8 MG disintegrating tablet Take 1 tablet (8 mg total) by mouth every 8 (eight) hours as needed for nausea or vomiting. 02/06/18   Jola Schmidt, MD    Family History Family History  Problem Relation Age of Onset  . Heart attack Father   . Hypertension Mother   . Diabetes Maternal Grandmother     Social History Social History   Tobacco Use  . Smoking status: Former Research scientist (life sciences)  . Smokeless tobacco: Never Used  . Tobacco comment: rare smoker 9 years ago  Substance Use Topics  . Alcohol use: Yes    Comment: Rare, 1x year  . Drug use: No     Allergies   Clindamycin/lincomycin; Dust mite extract; Flagyl [metronidazole hcl]; and Lincomycin hcl   Review of Systems Review of Systems  All other systems reviewed and are negative.    Physical Exam Updated Vital Signs BP 131/80   Pulse (!) 107   Temp 98.7 F (37.1 C) (Oral)   Resp 16   SpO2 100%   Physical Exam  Constitutional: She is oriented to person, place, and time. She appears well-developed and well-nourished. No distress.  HENT:  Head: Normocephalic and atraumatic.  Eyes: EOM are normal.  Neck: Normal range of motion.  Cardiovascular: Normal rate, regular rhythm and normal heart sounds.  Pulmonary/Chest: Effort normal and breath sounds normal.  Abdominal: Soft. She exhibits no distension. There is no tenderness.  Musculoskeletal: Normal range of motion.  Full range of motion of major joints.  Mild pain with range of motion of the left ankle without obvious deformity.  Normal pulses in left foot.  Compartments left lower extremity soft.  Neurological: She is alert and oriented to person, place, and time.  Skin:  Skin is warm and dry.  Psychiatric: She has a normal mood and affect. Judgment normal.  Nursing note and vitals reviewed.    ED Treatments / Results  Labs (all labs ordered are listed, but only abnormal results are displayed) Labs Reviewed  CBC WITH DIFFERENTIAL/PLATELET - Abnormal; Notable for the following components:      Result Value   WBC 14.9 (*)    Hemoglobin 11.2 (*)    HCT 34.7 (*)    Neutro Abs 13.1 (*)    All other components within normal limits  URINALYSIS, ROUTINE W  REFLEX MICROSCOPIC - Abnormal; Notable for the following components:   Color, Urine AMBER (*)    Nitrite POSITIVE (*)    Squamous Epithelial / LPF 0-5 (*)    All other components within normal limits  COMPREHENSIVE METABOLIC PANEL - Abnormal; Notable for the following components:   Sodium 133 (*)    Chloride 99 (*)    Glucose, Bld 125 (*)    Calcium 8.3 (*)    Albumin 2.9 (*)    AST 64 (*)    All other components within normal limits  CULTURE, OB URINE  CULTURE, BLOOD (ROUTINE X 2)  CULTURE, BLOOD (ROUTINE X 2)  I-STAT CG4 LACTIC ACID, ED    EKG  ECG interpretation   Date: 02/06/2018  Rate: 111  Rhythm: sinus tachycardia  QRS Axis: normal  Intervals: normal  ST/T Wave abnormalities: normal  Conduction Disutrbances: none  Narrative Interpretation:   Old EKG Reviewed: No significant changes noted except for tachycardia     Radiology Ct abd/pelvis for MAU personally reviewed demonstrating no drainable abscess or bowel obstruction. Bladder wall thickening noted    Procedures Procedures (including critical care time)  Medications Ordered in ED Medications  lactated ringers bolus 1,000 mL (0 mLs Intravenous Stopped 02/06/18 0630)  ketorolac (TORADOL) 30 MG/ML injection 30 mg (30 mg Intravenous Given 02/06/18 0615)  lactated ringers bolus 1,000 mL (0 mLs Intravenous Stopped 02/06/18 0900)  iopamidol (ISOVUE-300) 61 % injection 100 mL (100 mLs Intravenous Contrast Given 02/06/18 0931)    cefTRIAXone (ROCEPHIN) 1 g in sodium chloride 0.9 % 100 mL IVPB (0 g Intravenous Stopped 02/06/18 1346)     Initial Impression / Assessment and Plan / ED Course  I have reviewed the triage vital signs and the nursing notes.  Pertinent labs & imaging results that were available during my care of the patient were reviewed by me and considered in my medical decision making (see chart for details).     Patient hydrated at the MAU and this continued in the emergency department.  IV Rocephin given in the emergency department.  Urine culture sent.  Patient feels much better.  Headache improving.  Ambulatory in the ER.  No lightheadedness.  No orthostasis.  Plan for discharge home with outpatient antibiotics and instructions to return to the ER for new or worsening symptoms.  Final Clinical Impressions(s) / ED Diagnoses   Final diagnoses:  Pyelonephritis  Syncope, unspecified syncope type  Acute left ankle pain  Dehydration    ED Discharge Orders        Ordered    amoxicillin-clavulanate (AUGMENTIN) 875-125 MG tablet  2 times daily,   Status:  Discontinued     02/06/18 1104    tamsulosin (FLOMAX) 0.4 MG CAPS capsule  Daily,   Status:  Discontinued     02/06/18 1104    cephALEXin (KEFLEX) 500 MG capsule  3 times daily     02/06/18 1551    ondansetron (ZOFRAN ODT) 8 MG disintegrating tablet  Every 8 hours PRN     02/06/18 1551       Jola Schmidt, MD 02/21/18 2202

## 2018-02-28 ENCOUNTER — Encounter: Payer: Self-pay | Admitting: Obstetrics and Gynecology

## 2018-03-01 ENCOUNTER — Other Ambulatory Visit: Payer: Self-pay

## 2018-03-01 MED ORDER — FLUCONAZOLE 150 MG PO TABS: 150.0000 mg | ORAL_TABLET | Freq: Once | ORAL | 0 refills | Status: DC

## 2018-03-09 ENCOUNTER — Ambulatory Visit (INDEPENDENT_AMBULATORY_CARE_PROVIDER_SITE_OTHER): Payer: BLUE CROSS/BLUE SHIELD | Admitting: Obstetrics and Gynecology

## 2018-03-09 ENCOUNTER — Other Ambulatory Visit (HOSPITAL_COMMUNITY)
Admission: RE | Admit: 2018-03-09 | Discharge: 2018-03-09 | Disposition: A | Discharge status: Home / Self Care | Payer: BLUE CROSS/BLUE SHIELD | Source: Ambulatory Visit | Attending: Obstetrics and Gynecology | Admitting: Obstetrics and Gynecology

## 2018-03-09 ENCOUNTER — Encounter: Payer: Self-pay | Admitting: Obstetrics and Gynecology

## 2018-03-09 VITALS — BP 125/84 | HR 80 | Ht 62.0 in | Wt 222.1 lb

## 2018-03-09 DIAGNOSIS — Z90710 Acquired absence of both cervix and uterus: Secondary | ICD-10-CM

## 2018-03-09 DIAGNOSIS — R899 Unspecified abnormal finding in specimens from other organs, systems and tissues: Secondary | ICD-10-CM

## 2018-03-09 NOTE — Progress Notes (Signed)
Ms. Culhane is here for post op appt. S/P TVH/BS on 02/01/18 Post op course complicated by hospitalization for pyelonephritis and AGE from 3/11-3/14. Doing well today. No complaints except for some vaginal itching and discharge No bowel or bladder dysfunction. No pain. Tolerating diet  PE AF VSS Lungs clear Heart RRR Abd soft + BS GU cuff healing well some tannish discharge no masses or tenderness  A/P Post Op TVH/BS        Abnormal LFT, suspect related to AGE  Will repeat CMP today. Vaginal cultures today. Return to work note. F/U in 1 yr or PRN.

## 2018-03-09 NOTE — Patient Instructions (Signed)

## 2018-03-09 NOTE — Progress Notes (Signed)
Patient is in the office for post op follow up, hysterectomy on 02-01-18. Pt reports vaginal itching/irritation, despite taking diflucan.

## 2018-03-10 LAB — COMPREHENSIVE METABOLIC PANEL
A/G RATIO: 1.2 (ref 1.2–2.2)
ALK PHOS: 128 IU/L — AB (ref 39–117)
ALT: 13 IU/L (ref 0–32)
AST: 15 IU/L (ref 0–40)
Albumin: 3.8 g/dL (ref 3.5–5.5)
BUN / CREAT RATIO: 17 (ref 9–23)
BUN: 15 mg/dL (ref 6–20)
Bilirubin Total: 0.2 mg/dL (ref 0.0–1.2)
CO2: 27 mmol/L (ref 20–29)
CREATININE: 0.87 mg/dL (ref 0.57–1.00)
Calcium: 9.1 mg/dL (ref 8.7–10.2)
Chloride: 100 mmol/L (ref 96–106)
GFR calc Af Amer: 101 mL/min/{1.73_m2} (ref >59)
GFR, EST NON AFRICAN AMERICAN: 87 mL/min/{1.73_m2} (ref >59)
GLOBULIN, TOTAL: 3.1 g/dL (ref 1.5–4.5)
Glucose: 80 mg/dL (ref 65–99)
Potassium: 3.9 mmol/L (ref 3.5–5.2)
SODIUM: 140 mmol/L (ref 134–144)
Total Protein: 6.9 g/dL (ref 6.0–8.5)

## 2018-03-10 LAB — CERVICOVAGINAL ANCILLARY ONLY
BACTERIAL VAGINITIS: NEGATIVE
Candida vaginitis: NEGATIVE

## 2018-03-11 ENCOUNTER — Telehealth: Payer: Self-pay

## 2018-03-11 NOTE — Telephone Encounter (Signed)
Patient called to get her lab results. 

## 2018-03-12 ENCOUNTER — Encounter: Payer: Self-pay | Admitting: Obstetrics and Gynecology

## 2018-03-21 ENCOUNTER — Encounter: Payer: Self-pay | Admitting: Obstetrics and Gynecology

## 2018-03-24 ENCOUNTER — Other Ambulatory Visit: Payer: Self-pay | Admitting: Obstetrics

## 2018-04-21 ENCOUNTER — Encounter: Payer: Self-pay | Admitting: Obstetrics

## 2018-04-21 ENCOUNTER — Other Ambulatory Visit: Payer: Self-pay | Admitting: Obstetrics

## 2018-04-22 ENCOUNTER — Other Ambulatory Visit: Payer: Self-pay | Admitting: Obstetrics

## 2018-04-22 MED ORDER — FLUCONAZOLE 150 MG PO TABS: 150.0000 mg | ORAL_TABLET | Freq: Once | ORAL | 0 refills | Status: DC

## 2018-04-22 NOTE — Telephone Encounter (Signed)
Sent to [facility].

## 2018-04-22 NOTE — Telephone Encounter (Signed)
Sent to Dr. Jodi Mourning

## 2018-04-28 ENCOUNTER — Other Ambulatory Visit: Payer: Self-pay

## 2018-04-28 NOTE — Progress Notes (Unsigned)
Pt still having sx's after completing Diflucan pill on Friday. She is requesting the cream. She said usually when she takes the pill and the cream, sx's will subside. Taking the pill alone does not subside sx's per pt. Informed pt I will consult with provider and c/b.

## 2018-04-29 ENCOUNTER — Other Ambulatory Visit: Payer: Self-pay

## 2018-04-29 MED ORDER — TERCONAZOLE 0.8 % VA CREA: 1.0000 | TOPICAL_CREAM | Freq: Every day | VAGINAL | 0 refills | Status: DC

## 2018-08-06 ENCOUNTER — Other Ambulatory Visit: Payer: Self-pay

## 2018-08-08 MED ORDER — TERCONAZOLE 0.8 % VA CREA: 1.0000 | TOPICAL_CREAM | Freq: Every day | VAGINAL | 0 refills | Status: DC

## 2018-08-08 NOTE — Telephone Encounter (Signed)
Please review for refill.  

## 2018-08-24 ENCOUNTER — Other Ambulatory Visit: Payer: Self-pay | Admitting: Obstetrics and Gynecology

## 2018-10-01 ENCOUNTER — Other Ambulatory Visit: Payer: Self-pay

## 2018-10-12 MED ORDER — TINIDAZOLE 500 MG PO TABS: 2.0000 g | ORAL_TABLET | Freq: Every day | ORAL | 0 refills | Status: DC

## 2018-10-12 MED ORDER — FLUCONAZOLE 150 MG PO TABS: 150.0000 mg | ORAL_TABLET | Freq: Once | ORAL | 0 refills | Status: AC

## 2019-08-04 ENCOUNTER — Emergency Department (HOSPITAL_BASED_OUTPATIENT_CLINIC_OR_DEPARTMENT_OTHER)
Admission: EM | Admit: 2019-08-04 | Discharge: 2019-08-04 | Disposition: A | Discharge status: Home / Self Care | Payer: BLUE CROSS/BLUE SHIELD | Attending: Emergency Medicine | Admitting: Emergency Medicine

## 2019-08-04 ENCOUNTER — Other Ambulatory Visit: Payer: Self-pay

## 2019-08-04 ENCOUNTER — Emergency Department (HOSPITAL_BASED_OUTPATIENT_CLINIC_OR_DEPARTMENT_OTHER): Payer: BLUE CROSS/BLUE SHIELD

## 2019-08-04 ENCOUNTER — Encounter (HOSPITAL_BASED_OUTPATIENT_CLINIC_OR_DEPARTMENT_OTHER): Payer: Self-pay | Admitting: Adult Health

## 2019-08-04 DIAGNOSIS — S99929A Unspecified injury of unspecified foot, initial encounter: Secondary | ICD-10-CM

## 2019-08-04 DIAGNOSIS — M79672 Pain in left foot: Secondary | ICD-10-CM | POA: Insufficient documentation

## 2019-08-04 DIAGNOSIS — Z87891 Personal history of nicotine dependence: Secondary | ICD-10-CM | POA: Diagnosis not present

## 2019-08-04 NOTE — ED Provider Notes (Signed)
[facility] EMERGENCY DEPARTMENT Provider Note   CSN: [id number] Arrival date & time: 08/04/19  1832     History   Chief Complaint Chief Complaint  Patient presents with  . Foot Pain    HPI Lauren Garza is a 38 y.o. female. Patient is a 38 year old female with several weeks of left foot pain that abruptly worsened today when she was walking across the parking lot and felt a pop in her left foot.  Patient states the pain is constant and worse with movement or weightbearing.  Patient denies hearing a pop during the incident.  No prior surgeries or ankle problems.  Patient has not used any medication for pain since the pain began today but has used ibuprofen for the ongoing pain she has had in her foot prior to today.     HPI  Past Medical History:  Diagnosis Date  . Abnormal Pap smear   . Acid reflux   . Asthma   . Bacterial vaginosis   . Chlamydia trachomatis infection 2012  . DUB (dysfunctional uterine bleeding)   . Family history of adverse reaction to anesthesia    mom has n and V past surgery  . Frequent headaches    migraines  . Hypertension     Patient Active Problem List   Diagnosis Date Noted  . Abnormal laboratory test 03/09/2018  . Kidney stone on left side 02/06/2018  . S/P vaginal hysterectomy 02/01/2018  . Generalized headaches 10/25/2017    Past Surgical History:  Procedure Laterality Date  . GYNECOLOGIC CRYOSURGERY    . I&D EXTREMITY Right 04/04/2015   Procedure: IRRIGATION AND DEBRIDEMENT RIGHT WRIST;  Surgeon: Leanora Cover, MD;  Location: [facility];  Service: Orthopedics;  Laterality: Right;  . SINUS ENDO WITH FUSION N/A 01/25/2017   Procedure: SINUS ENDO WITH FUSION;  Surgeon: Melida Quitter, MD;  Location: [facility];  Service: ENT;  Laterality: N/A;  . TUBAL LIGATION    . VAGINAL HYSTERECTOMY Bilateral 02/01/2018   Procedure: HYSTERECTOMY VAGINAL WITH SALPINGECTOMY;  Surgeon: Chancy Milroy, MD;  Location: [facility];   Service: Gynecology;  Laterality: Bilateral;  . WISDOM TOOTH EXTRACTION    . WRIST SURGERY       OB History    Gravida  3   Para  3   Term  3   Preterm  0   AB  0   Living  3     SAB  0   TAB  0   Ectopic  0   Multiple  0   Live Births               Home Medications    Prior to Admission medications   Medication Sig Start Date End Date Taking? Authorizing Provider  albuterol (PROAIR HFA) 108 (90 Base) MCG/ACT inhaler Inhale 1 puff into the lungs every 4 (four) hours as needed for wheezing or shortness of breath.     [provider]  Biotin 10000 MCG TABS Take 1 tablet by mouth daily.    [provider]  budesonide-formoterol (SYMBICORT) 160-4.5 MCG/ACT inhaler Inhale 2 puffs into the lungs 2 (two) times daily.    [provider]  cetirizine (ZYRTEC) 10 MG tablet Take 10 mg by mouth daily.  06/09/17   [provider]  EPINEPHrine 0.3 mg/0.3 mL IJ SOAJ injection Inject 0.3 mg into the muscle once as needed.     [provider]  montelukast (SINGULAIR) 10 MG tablet Take  10 mg by mouth daily.  04/27/17   [provider]  Omega-3 1000 MG CAPS Take 1 capsule by mouth daily.    [provider]  terconazole (TERAZOL 3) 0.8 % vaginal cream Place 1 applicator vaginally at bedtime. Apply nightly for three nights. 08/08/18   Shelly Bombard, MD  tinidazole (TINDAMAX) 500 MG tablet Take 4 tablets (2,000 mg total) by mouth daily with breakfast. 10/12/18   Shelly Bombard, MD  triamterene-hydrochlorothiazide (DYAZIDE) 37.5-25 MG capsule Take 1 capsule by mouth daily.    [provider]    Family History Family History  Problem Relation Age of Onset  . Heart attack Father   . Hypertension Mother   . Diabetes Maternal Grandmother     Social History Social History   Tobacco Use  . Smoking status: Former Research scientist (life sciences)  . Smokeless tobacco: Never Used  . Tobacco comment: rare smoker 9 years ago  Substance  Use Topics  . Alcohol use: Yes    Comment: Rare, 1x year  . Drug use: No     Allergies   Clindamycin/lincomycin, Dust mite extract, Flagyl [metronidazole hcl], and Lincomycin hcl   Review of Systems Review of Systems  Constitutional: Negative for fever.  HENT: Negative for congestion.   Respiratory: Negative for cough.   Gastrointestinal: Negative for nausea and vomiting.  Genitourinary: Negative for dysuria.  Musculoskeletal: Positive for joint swelling. Negative for arthralgias, back pain, myalgias and neck pain.  Skin: Negative for color change and rash.  Neurological: Negative for headaches.  All other systems reviewed and are negative.    Physical Exam Updated Vital Signs BP 133/78   Pulse 84   Temp 98.8 F (37.1 C) (Oral)   Resp 18   Ht 5\' 3"  (1.6 m)   Wt 104.3 kg   LMP 02/11/2018 (Exact Date)   SpO2 98%   BMI 40.74 kg/m   Physical Exam Vitals signs and nursing note reviewed.  Constitutional:      General: She is not in acute distress.    Appearance: Normal appearance. She is not ill-appearing.  HENT:     Head: Normocephalic and atraumatic.  Eyes:     General: No scleral icterus.       Right eye: No discharge.        Left eye: No discharge.     Conjunctiva/sclera: Conjunctivae normal.  Cardiovascular:     Pulses: Normal pulses.     Comments: DP and PT pulses intact bilateral lower extremities Pulmonary:     Effort: Pulmonary effort is normal.     Breath sounds: No stridor.  Musculoskeletal:     Comments: Left lower extremity: No appreciable swelling or bruising of the left plantar surface of the foot, or ankle.  Plantar and dorsiflexion is weak against resistance and ROM is decreased secondary to pain.  Patient unable to bear weight comfortably.  Patient able to move toes.  Left ankle without tenderness to palpation over the ATFL, anterior posterior or medial ankle.  Skin:    General: Skin is warm and dry.     Findings: No bruising or erythema.   Neurological:     Mental Status: She is alert and oriented to person, place, and time. Mental status is at baseline.     Comments: Sensation to light touch intact to bilateral lower extremities, toes, plantar surfaces of feet, dorsal surfaces of feet      ED Treatments / Results  Labs (all labs ordered are listed, but only abnormal  results are displayed) Labs Reviewed - No data to display  EKG None  Radiology Dg Foot Complete Left  Result Date: 08/04/2019 CLINICAL DATA:  Pt's foot has been hurting while walking x 1 week; today walking across parking lot she heard a "pop"; pain medial side of foot; no prior injury EXAM: LEFT FOOT - COMPLETE 3+ VIEW COMPARISON:  None. FINDINGS: There is no evidence of fracture or dislocation. There is no evidence of arthropathy or other focal bone abnormality. Soft tissues are unremarkable. IMPRESSION: Negative. Electronically Signed   By: Nolon Nations M.D.   On: 08/04/2019 19:10    Procedures Procedures (including critical care time)  Medications Ordered in ED Medications - No data to display   Initial Impression / Assessment and Plan / ED Course  I have reviewed the triage vital signs and the nursing notes.  ertinent labs & imaging results that were available during my care of the patient were reviewed by me and considered in my medical decision making (see chart for details).  Patient is a healthy 38 year old female presenting today for left foot pain that began after she felt a pop earlier today.  This pain is a worsening of pain she has had over the past several weeks.  Patient has good sensation and pulses on physical exam and is likely experiencing a sprain or strain.  Patient given follow-up with podiatrist and counseled to use NSAID for pain until she is able to make an appointment.  Patient given return precautions.  Patient is understanding of plan at this time and provided postop shoe and crutches.           Final Clinical  Impressions(s) / ED Diagnoses   Final diagnoses:  Foot pain, left    ED Discharge Orders    None       Tedd Sias, [place] 08/04/19 2126    Fredia Sorrow, MD 08/10/19 [facility]

## 2019-08-04 NOTE — ED Triage Notes (Signed)
Presents with left medial foot pain that began a few weeks ago but became worse at 5 pm today while walking. She reports that she was walking ans heard a pop in her foot and now her foot is extremely painful. She is unable to bear weight and states that depending on how she sits the pain is owrse or better.

## 2019-08-04 NOTE — Discharge Instructions (Addendum)
Follow up with podiatrist as discussed.  Use Tylenol ibuprofen for pain and swelling.  Rest, ice, elevate limb and apply Ace wrap for swelling.

## 2019-12-08 IMAGING — DX DG CHEST 1V PORT
1 series · 1 of 1 positions shown · non-contrast
Comparison: 09/25/2015 chest radiograph.

CLINICAL DATA: Sepsis

EXAM:
PORTABLE CHEST 1 VIEW

[chest ap]
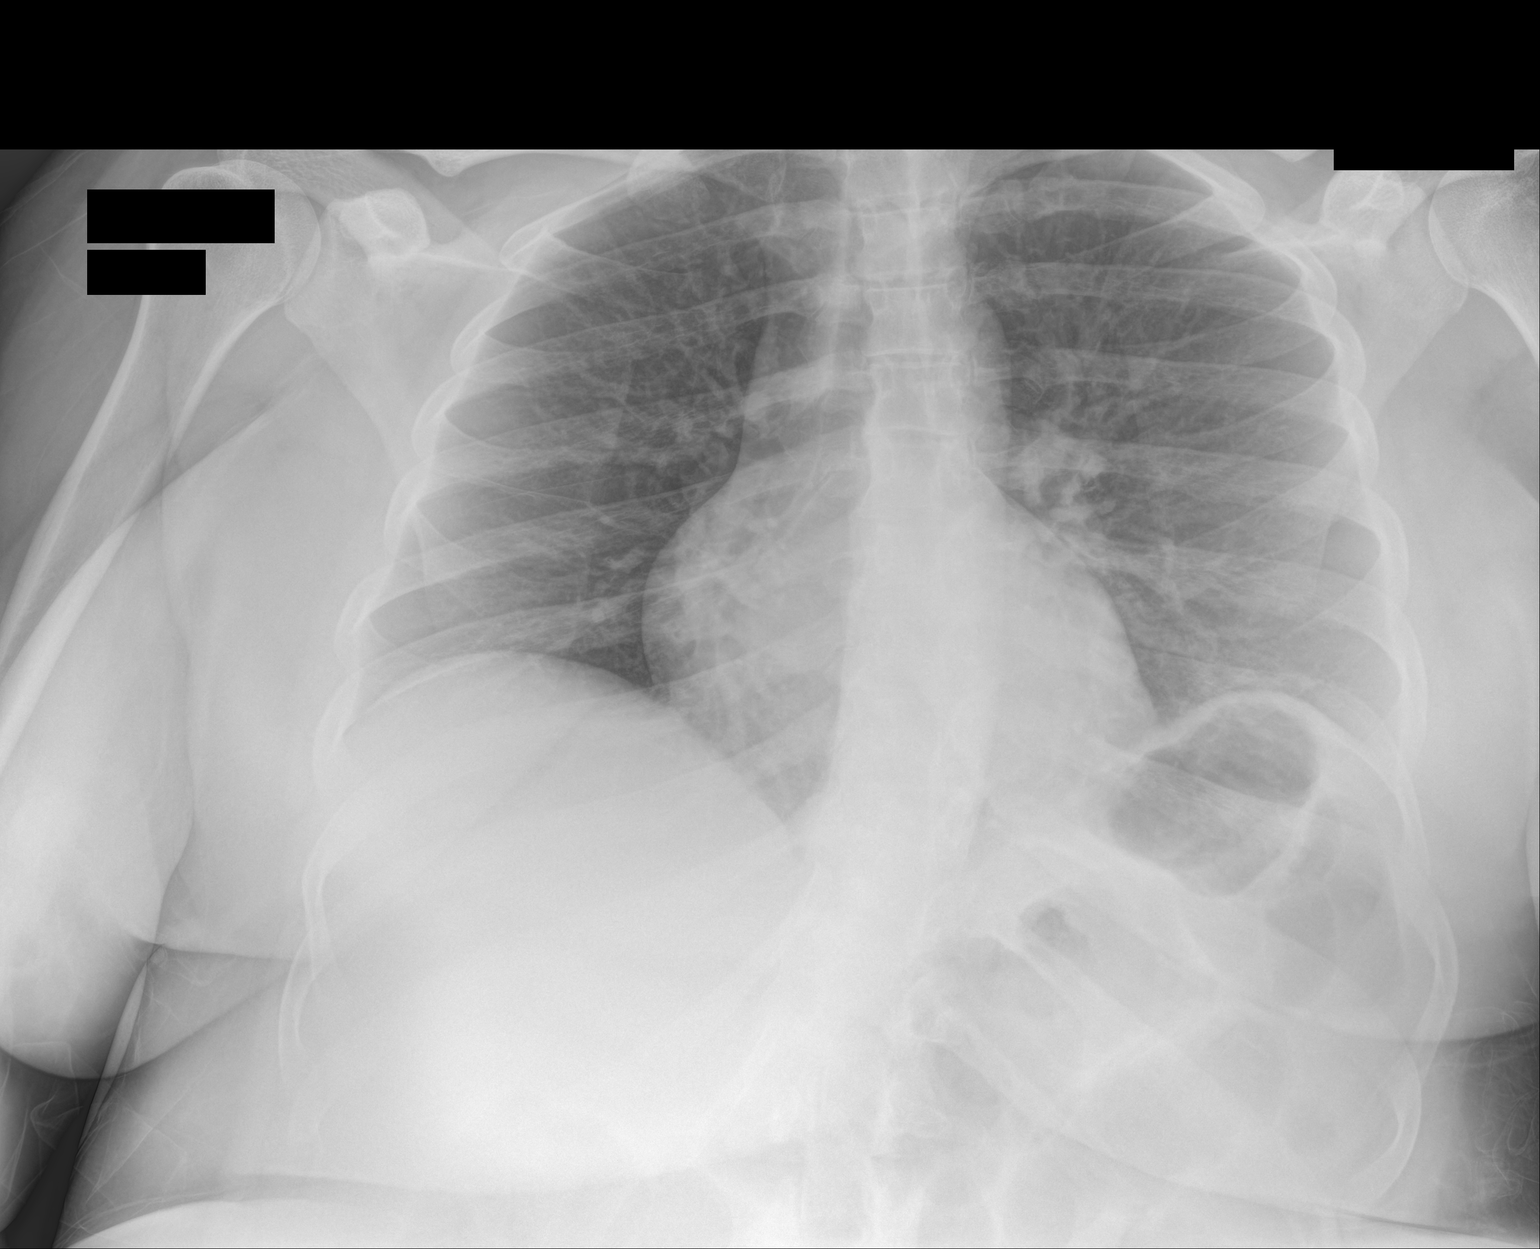

[1 of 1 positions shown; findings below may reference images not displayed]

FINDINGS: Stable cardiomediastinal silhouette with normal heart size. No
pneumothorax. No pleural effusion. Lung volumes are slightly low. No
overt pulmonary edema. No acute consolidative airspace disease.
IMPRESSION: Slightly low lung volumes.  No active cardiopulmonary disease.

## 2020-01-16 ENCOUNTER — Ambulatory Visit (INDEPENDENT_AMBULATORY_CARE_PROVIDER_SITE_OTHER): Payer: 59 | Admitting: Neurology

## 2020-01-16 ENCOUNTER — Other Ambulatory Visit: Payer: Self-pay

## 2020-01-16 ENCOUNTER — Encounter: Payer: Self-pay | Admitting: Neurology

## 2020-01-16 VITALS — BP 104/77 | HR 74 | Temp 96.9°F | Ht 63.0 in | Wt 238.0 lb

## 2020-01-16 DIAGNOSIS — G5713 Meralgia paresthetica, bilateral lower limbs: Secondary | ICD-10-CM

## 2020-01-16 DIAGNOSIS — M79604 Pain in right leg: Secondary | ICD-10-CM

## 2020-01-16 DIAGNOSIS — Z6841 Body Mass Index (BMI) 40.0 and over, adult: Secondary | ICD-10-CM | POA: Insufficient documentation

## 2020-01-16 DIAGNOSIS — M5416 Radiculopathy, lumbar region: Secondary | ICD-10-CM | POA: Diagnosis not present

## 2020-01-16 DIAGNOSIS — E669 Obesity, unspecified: Secondary | ICD-10-CM | POA: Insufficient documentation

## 2020-01-16 DIAGNOSIS — R635 Abnormal weight gain: Secondary | ICD-10-CM | POA: Diagnosis not present

## 2020-01-16 DIAGNOSIS — R202 Paresthesia of skin: Secondary | ICD-10-CM | POA: Diagnosis not present

## 2020-01-16 DIAGNOSIS — R350 Frequency of micturition: Secondary | ICD-10-CM

## 2020-01-16 DIAGNOSIS — M79605 Pain in left leg: Secondary | ICD-10-CM

## 2020-01-16 DIAGNOSIS — M4807 Spinal stenosis, lumbosacral region: Secondary | ICD-10-CM

## 2020-01-16 DIAGNOSIS — R29898 Other symptoms and signs involving the musculoskeletal system: Secondary | ICD-10-CM

## 2020-01-16 MED ORDER — LIDOCAINE 5 % EX PTCH: 2.0000 | MEDICATED_PATCH | CUTANEOUS | 0 refills | Status: DC

## 2020-01-16 NOTE — Progress Notes (Signed)
[facility]    Provider:  Dr Jaynee Eagles Requesting Provider: Gerald Leitz Primary Care Provider:  Lin Landsman, MD  CC:  Thigh pain  HPI:  Lauren Garza is a 39 y.o. female here as requested by Andria Frames, PA-C for pain in the right thigh, merlgi paresthetica, severe obesity BMI of 40-44. PMHx plantar fasciitis, hypertension, frequent headaches, asthma, kidney stones, hysterectomy.  I reviewed Lauren Garza's notes, patient came in for pain in her thighs, bilateral thigh pain, been ongoing at least several months but stable, thighs are very tender to touch, she has not seen PT or chiropractor, she is obese and asked for phentermine, thigh pain positive for numbness, on examination tenderness to palpation about her thighs bilaterally, she was given a shot of Depo-Medrol, she was started on gabapentin, suspicion for meralgia paresthetica, weight loss was recommended for her severe obesity class III.  Symptoms started years ago with numbness in the outer thighs. No inciting events, no accidents or trauma or anything that preceded. In October/Novemebr of last year she was seen for plantar fasciitis and she was walking differently and the pain in the thigh worsened it is burning, throbbing, severe, radiating, in the lateral thigh area, doesn't move, doesn't radiate past the knees, no low back pain associated, symmetric and bilateral, rolling over in bed makes it worse. tanding for long periods of time make it worse. Sitting makes it worse. Can be severe. Also buttocks pain. She went to physical therapy and it did not help. She has had insurance issues. Gabapentin doesn't help. This is everyday, severe, burning, nothing helps, comes and goes all day. She is going to the bathroom more, feels stream is weaker. No other focal neurologic deficits, associated symptoms, inciting events or modifiable factors.No abdominal pain.   Reviewed notes, labs and imaging from  outside physicians, which showed:  CT Abdomen Pelvis 02/06/2018 reviewed report: 1. Status post hysterectomy. Edema and ill-defined complex fluid within the pelvis, likely due to postoperative chronic hemorrhage/hematoma. No drainable abscess. 2. No bowel obstruction or other acute complication. 3.  Possible constipation. 4. Mild bladder wall thickening could represent cystitis. 5. Left nephrolithiasis.   Review of Systems: Patient complains of symptoms per HPI as well as the following symptoms: weight gain. Pertinent negatives and positives per HPI. All others negative.   Social History   Socioeconomic History  . Marital status: Divorced    Spouse name: Not on file  . Number of children: 3  . Years of education: Not on file  . Highest education level: 11th grade  Occupational History  . Not on file  Tobacco Use  . Smoking status: Former Research scientist (life sciences)  . Smokeless tobacco: Never Used  . Tobacco comment: rare smoker 9 years ago  Substance and Sexual Activity  . Alcohol use: Yes    Comment: Rare, 1x year  . Drug use: No  . Sexual activity: Yes    Birth control/protection: Surgical  Other Topics Concern  . Not on file  Social History Narrative   Lives at home with her children and parents    Right handed   Caffeine: coffee, 1 cup/day   Social Determinants of Health   Financial Resource Strain:   . Difficulty of Paying Living Expenses: Not on file  Food Insecurity:   . Worried About Charity fundraiser in the Last Year: Not on file  . Ran Out of Food in the Last Year: Not on file  Transportation Needs:   .  Lack of Transportation (Medical): Not on file  . Lack of Transportation (Non-Medical): Not on file  Physical Activity:   . Days of Exercise per Week: Not on file  . Minutes of Exercise per Session: Not on file  Stress:   . Feeling of Stress : Not on file  Social Connections:   . Frequency of Communication with Friends and Family: Not on file  . Frequency of Social  Gatherings with Friends and Family: Not on file  . Attends Religious Services: Not on file  . Active Member of Clubs or Organizations: Not on file  . Attends Archivist Meetings: Not on file  . Marital Status: Not on file  Intimate Partner Violence:   . Fear of Current or Ex-Partner: Not on file  . Emotionally Abused: Not on file  . Physically Abused: Not on file  . Sexually Abused: Not on file    Family History  Problem Relation Age of Onset  . Heart attack Father   . Hypertension Mother   . Diabetes Mother   . Other Mother        questionable nerve issue, has numbness in thighs too  . Diabetes Maternal Grandmother     Past Medical History:  Diagnosis Date  . Abnormal Pap smear   . Acid reflux   . Asthma   . Bacterial vaginosis   . Chlamydia trachomatis infection 2012  . DUB (dysfunctional uterine bleeding)   . Family history of adverse reaction to anesthesia    mom has n and V past surgery  . Frequent headaches    migraines  . Hypertension    "not anymore", taken off BP meds per pt report  . Plantar fasciitis    right     Patient Active Problem List   Diagnosis Date Noted  . Meralgia paresthetica of both lower extremities 01/16/2020  . Morbid obesity ([place]) 01/16/2020  . Abnormal laboratory test 03/09/2018  . Kidney stone on left side 02/06/2018  . S/P vaginal hysterectomy 02/01/2018  . Generalized headaches 10/25/2017    Past Surgical History:  Procedure Laterality Date  . GYNECOLOGIC CRYOSURGERY    . I & D EXTREMITY Right 04/04/2015   Procedure: IRRIGATION AND DEBRIDEMENT RIGHT WRIST;  Surgeon: Leanora Cover, MD;  Location: [facility];  Service: Orthopedics;  Laterality: Right;  . SINUS ENDO WITH FUSION N/A 01/25/2017   Procedure: SINUS ENDO WITH FUSION;  Surgeon: Melida Quitter, MD;  Location: [facility] [facility];  Service: ENT;  Laterality: N/A;  . TUBAL LIGATION    . VAGINAL HYSTERECTOMY Bilateral 02/01/2018   Procedure: HYSTERECTOMY VAGINAL WITH  SALPINGECTOMY;  Surgeon: Chancy Milroy, MD;  Location: [facility];  Service: Gynecology;  Laterality: Bilateral;  . WISDOM TOOTH EXTRACTION    . WRIST SURGERY      Current Outpatient Medications  Medication Sig Dispense Refill  . albuterol (ACCUNEB) 0.63 MG/3ML nebulizer solution Take by nebulization.    Marland Kitchen albuterol (PROAIR HFA) 108 (90 Base) MCG/ACT inhaler Inhale 1 puff into the lungs every 4 (four) hours as needed for wheezing or shortness of breath.     . budesonide-formoterol (SYMBICORT) 80-4.5 MCG/ACT inhaler Inhale 2 puffs into the lungs 2 (two) times daily.    . cetirizine (ZYRTEC) 10 MG tablet Take 10 mg by mouth daily.   0  . EPINEPHrine 0.3 mg/0.3 mL IJ SOAJ injection Inject 0.3 mg into the muscle once as needed.     . fluticasone (FLONASE) 50 MCG/ACT nasal spray 1 spray  by Each Nare route daily.    Marland Kitchen gabapentin (NEURONTIN) 100 MG capsule Take 100 mg by mouth 3 (three) times daily as needed.    . linaclotide (LINZESS) 145 MCG CAPS capsule Take by mouth.    . montelukast (SINGULAIR) 10 MG tablet Take 10 mg by mouth daily.   0  . omeprazole (PRILOSEC) 40 MG capsule Take 40 mg by mouth daily.    Marland Kitchen spironolactone (ALDACTONE) 100 MG tablet Take 100 mg by mouth daily.    Marland Kitchen tinidazole (TINDAMAX) 500 MG tablet Take 4 tablets (2,000 mg total) by mouth daily with breakfast. 8 tablet 0  . tretinoin (RETIN-A) 0.025 % cream Apply nightly as directed    . valACYclovir (VALTREX) 1000 MG tablet Take 1,000 mg by mouth as needed.    . lidocaine (LIDODERM) 5 % Place 2 patches onto the skin daily. Remove & Discard patch within 12 hours or as directed by MD 30 patch 0   No current facility-administered medications for this visit.    Allergies as of 01/16/2020 - Review Complete 01/16/2020  Allergen Reaction Noted  . Clindamycin/lincomycin Hives 08/15/2011  . Dust mite extract  01/26/2018  . Flagyl [metronidazole hcl] Hives 08/15/2011  . Lincomycin hcl Hives 08/15/2011    Vitals: BP 104/77 (BP  Location: Left Arm, Patient Position: Sitting, Cuff Size: Large)   Pulse 74   Temp (!) 96.9 F (36.1 C) Comment: taken at front  Ht 5\' 3"  (1.6 m)   Wt 238 lb (108 kg)   LMP 02/11/2018 (Exact Date)   BMI 42.16 kg/m  Last Weight:  Wt Readings from Last 1 Encounters:  01/16/20 238 lb (108 kg)   Last Height:   Ht Readings from Last 1 Encounters:  01/16/20 5\' 3"  (1.6 m)     Physical exam: Exam: Gen: NAD, conversant, well nourised, morbidly obese, well groomed                     CV: RRR, no MRG. No Carotid Bruits. No peripheral edema, warm, nontender Eyes: Conjunctivae clear without exudates or hemorrhage  Neuro: Detailed Neurologic Exam  Speech:    Speech is normal; fluent and spontaneous with normal comprehension.  Cognition:    The patient is oriented to person, place, and time;     recent and remote memory intact;     language fluent;     normal attention, concentration,     fund of knowledge Cranial Nerves:    The pupils are equal, round, and reactive to light. Could not viualize fundi. Visual fields are full to finger confrontation. Extraocular movements are intact. Trigeminal sensation is intact and the muscles of mastication are normal. The face is symmetric. The palate elevates in the midline. Hearing intact. Voice is normal. Shoulder shrug is normal. The tongue has normal motion without fasciculations.   Coordination:    Normal finger to nose and heel to shin. Normal rapid alternating movements.   Gait:    Heel-toe and tandem gait are normal.   Motor Observation:    No asymmetry, no atrophy, and no involuntary movements noted. Tone:    Normal muscle tone.    Posture:    Posture is normal. normal erect    Strength: left leg hip flexion 4+/5 otherwise strength is V/V in the upper and lower limbs.      Sensation: intact to LT     Reflex Exam:  DTR's:    Deep tendon reflexes in the upper and lower extremities  are normal bilaterally.   Toes:    The toes  are downgoing bilaterally.   Clonus:    Clonus is absent.    Assessment/Plan: This is a 39 year old patient with likely meralgia paresthetica, morbid obesity.  However she needs a thorough examination to rule out lumbar radiculopathy given some concerning symptoms including some low back pain with radiation into the buttocks, lateral thigh paresthesias, left leg proximal weakness on examination, reports of possible urinary problems, need to ensure there is no etiology in the low back.  However it is likely meralgia paresthetica and we did discuss the risk factors including tight clothing's but especially morbid obesity which is probably highly contributory in this case.  She had a CT of the abdomen pelvis in 2019 which did not show compressive etiology of the nerves.  She has been under the care of a doctor for several years, failed conservative treatments, physical therapy I do think MRI of the lumbar spine is indicated.  - MRI lumbar spine - Not tolerating Gabapentin, she declines Lyrica, discussed other medications - [facility] - Topical patches will try to get 5% approved through insurance if not can get OTC 4% lidocaine patches - Nerve blocks - lateral femoral cutaneous nerve blocks: Patient has likely meralgia paresthetica, she needs lateral femoral cutaneous nerve blocks, she can go to orthopedics or neurosurgery or any pain clinic whoever provides these please place referral. - Can consider more PT for stretching and other but can find exercises online.   Orders Placed This Encounter  Procedures  . MR LUMBAR SPINE WO CONTRAST  . TSH  . Ambulatory referral to [facility]  . Ambulatory referral to Pain Clinic   Meds ordered this encounter  Medications  . lidocaine (LIDODERM) 5 %    Sig: Place 2 patches onto the skin daily. Remove & Discard patch within 12 hours or as directed by MD    Dispense:  30 patch    Refill:  0    Cc: Gerald Leitz,   Lin Landsman, MD  Sarina Ill, MD  [facility] [address] [place], [place] [address]  Phone [phone] Fax [phone]

## 2020-01-16 NOTE — Patient Instructions (Addendum)
- MRI lumbar spine - [facility] - Topical patches will try to get 5% approved through insurance if not can get OTC 4% lidocaine patches - Nerve blocks - lateral femoral cutaneous nerve blocks - Can consider more PT for stretching and other but can find exercises online. - check TSH   Lateral Femoral Cutaneous Nerve Block Patient Information  Description: The lateral femoral cutaneous nerve of the thigh is a purely sensory nerve that can become entrapped or irritated for a number of reasons.  The pain associated with this condition is called meralgia paraesthetica.  Patients affected with this syndrome have burning pain or abnormal sensation along the lateral aspect of the thigh.  The pain can be worsened by prolonged walking, standing, or constrictive garments around the house.   The diagnosis can be confirmed and treatment initiated by blocking the nerve with local anesthetic (like Novocaine).  At times, a steroid solution may be injected at the same time.  The site of injection is through a tiny needle in the left, lower quadrant of the abdomen.   The entire block usually lasts less than 5 minutes.  Conditions which may be treated by lateral femoral cutaneous nerve block:   Meralagia paraesthetica  Preparation for the injection:  1. Do not eat any solid food or dairy products within 8 hours of your appointment.  2. You may drink clear liquids up to 3 hours before appointment.  Clear liquids include water, black coffee, juice or soda. No milk or cream please. 3. You may take your regular medication, including pain medications, with a sip of water before your appointment.  Diabetics should hold regular insulin (if taken separately) and take 1/2 normal NPH dose the morning of the procedure.  Carry some sugar containing items with you to your appointment. 4. A driver must accompany you and be prepared to drive you home after your procedure. 5. Bring all you current  medications with you 6. An IV may be inserted and sedation may be given at the discretion of the physician. 7. A blood pressure cuff, EKG and other monitors will often be applied during the procedure.  Some patients may need to have extra oxygen administered for a short period. 8. You will be asked to provide medical information, including your allergies and medications, prior to the procedure.  We must know immediately if you are taking blood thinners (like Coumadin/Warfarin) or if you allergic to IV iodine contrast (dye)  We must know if you could possible be pregnant.   Possible side-effects:   Bleeding from needle site  Infection (rate, may require surgery)  Nerve injury (rare)  Numbness and Tingling (temporary)  Light-headedness (temporary)  Pain at injection site (several day)  Decreased blood pressure (rare, temporary)  Weakness in leg (temporary)  Call if you experience:  Hives or difficulty breathing (go to the emergency room)  Inflammation or drainage at the injection site(s)  Please note:  Although the local anesthetic injected can often make your leg feel good for several hours after the injection,  The pain may return.  It takes 3-7 days for steroids to work.  You may not notice any pain relief for at least one week.  If effective, we will often do a series of injections spaced 3-6 weeks apart to maximally decrease your pain.  If you have any questions, please cll [phone] [facility]   Lidocaine dermal patch What is this medicine? LIDOCAINE (LYE doe  kane) causes loss of feeling in the skin and surrounding area. The medicine helps treat pain, including nerve pain. This medicine may be used for other purposes; ask your health care provider or pharmacist if you have questions. COMMON BRAND NAME(S): Aspercreme with Lidocaine, Blue-Emu, GEN7T, Lidocare, Lidoderm, Salonpas Lidocaine, ZTlido What should I tell my health care  provider before I take this medicine? They need to know if you have any of these conditions:  heart disease  history of irregular heart beat  liver disease  skin conditions or sensitivity  skin infection  an unusual or allergic reaction to lidocaine, parabens, other medicines, foods, dyes, or preservatives  pregnant or trying to get pregnant  breast-feeding How should I use this medicine? This medicine is for external use only. Follow the directions on the prescription label or package. Talk to your pediatrician regarding the use of this medicine in children. Special care may be needed. Overdosage: If you think you have taken too much of this medicine contact a poison control center or emergency room at once. NOTE: This medicine is only for you. Do not share this medicine with others. What if I miss a dose? If you miss a dose, take it as soon as you can. If it is almost time for your next dose, take only that dose. Do not take double or extra doses. What may interact with this medicine? Do not take this medicine with any of the following medications:  certain medicines for irregular heart beat  MAOIs like Carbex, Eldepryl, Marplan, Nardil, and Parnate This medicine may also interact with the following medications:  other local anesthetics like pramoxine, tetracaine Do not use any other skin products on the affected area without asking your doctor or health care professional. This list may not describe all possible interactions. Give your health care provider a list of all the medicines, herbs, non-prescription drugs, or dietary supplements you use. Also tell them if you smoke, drink alcohol, or use illegal drugs. Some items may interact with your medicine. What should I watch for while using this medicine? Tell your doctor or healthcare professional if your symptoms do not start to get better or if they get worse. Be careful to avoid injury while the area is numb from the  medicine, and you are not aware of pain. If you are going to need surgery, a MRI, CT scan, or other procedure, tell your doctor that you are using this medicine. You may need to remove this patch before the procedure. Do not get this medicine in your eyes. If you do, rinse out with plenty of cool tap water. This medicine can make certain skin conditions worse. Only use it for conditions for which your doctor or health care professional has prescribed. What side effects may I notice from receiving this medicine? Side effects that you should report to your doctor or health care professional as soon as possible:  allergic reactions like skin rash, itching or hives, swelling of the face, lips, or tongue  breathing problems  chest pain or chest tightness  dizzines Side effects that usually do not require medical attention (report to your doctor or health care professional if they continue or are bothersome):  tingling, numbness at site where applied This list may not describe all possible side effects. Call your doctor for medical advice about side effects. You may report side effects to FDA at 1-800-FDA-1088. Where should I keep my medicine? Keep out of the reach of children. See product for storage  instructions. Each product may have different instructions. NOTE: This sheet is a summary. It may not cover all possible information. If you have questions about this medicine, talk to your doctor, pharmacist, or health care provider.  2020 Elsevier/Gold Standard (2017-08-18 22:50:48)

## 2020-01-17 LAB — TSH: TSH: 1.45 u[IU]/mL (ref 0.450–4.500)

## 2020-01-18 ENCOUNTER — Telehealth: Payer: Self-pay | Admitting: *Deleted

## 2020-01-18 ENCOUNTER — Encounter: Payer: Self-pay | Admitting: *Deleted

## 2020-01-18 NOTE — Telephone Encounter (Signed)
Completed Lidocaine 5% patch on [employer]. [id number]. Awaiting determination from [employer].   Per plan: If you have any questions, please contact [employer] at [phone].

## 2020-01-18 NOTE — Telephone Encounter (Signed)
Per [employer], Lidocaine patch denied. I messaged pt in mychart so she knows to purchase OTC 4% patches per office note.

## 2020-01-30 ENCOUNTER — Telehealth: Payer: Self-pay | Admitting: Neurology

## 2020-01-30 NOTE — Telephone Encounter (Signed)
[facility] Auth: [id number] (exp. 01/30/20 to 04/29/20) order sent to GI. They will reach out to the patient to schedule.

## 2020-02-01 ENCOUNTER — Ambulatory Visit (INDEPENDENT_AMBULATORY_CARE_PROVIDER_SITE_OTHER): Payer: 59 | Admitting: Physical Medicine and Rehabilitation

## 2020-02-01 ENCOUNTER — Encounter: Payer: Self-pay | Admitting: Physical Medicine and Rehabilitation

## 2020-02-01 ENCOUNTER — Other Ambulatory Visit: Payer: Self-pay

## 2020-02-01 VITALS — BP 118/81 | HR 79 | Ht 63.0 in | Wt 236.0 lb

## 2020-02-01 DIAGNOSIS — G5713 Meralgia paresthetica, bilateral lower limbs: Secondary | ICD-10-CM | POA: Diagnosis not present

## 2020-02-01 DIAGNOSIS — M7062 Trochanteric bursitis, left hip: Secondary | ICD-10-CM

## 2020-02-01 DIAGNOSIS — R202 Paresthesia of skin: Secondary | ICD-10-CM | POA: Diagnosis not present

## 2020-02-01 DIAGNOSIS — M7061 Trochanteric bursitis, right hip: Secondary | ICD-10-CM

## 2020-02-01 NOTE — Progress Notes (Signed)
 .  Numeric Pain Rating Scale and Functional Assessment Average Pain 8 Pain Right Now 7 My pain is constant, burning and numbness Pain is worse with: standing Pain improves with: rest   In the last MONTH (on 0-10 scale) has pain interfered with the following?  1. General activity like being  able to carry out your everyday physical activities such as walking, climbing stairs, carrying groceries, or moving a chair?  Rating(8)  2. Relation with others like being able to carry out your usual social activities and roles such as  activities at home, at work and in your community. Rating(8)  3. Enjoyment of life such that you have  been bothered by emotional problems such as feeling anxious, depressed or irritable?  Rating(4)

## 2020-02-16 ENCOUNTER — Ambulatory Visit (INDEPENDENT_AMBULATORY_CARE_PROVIDER_SITE_OTHER): Payer: 59 | Admitting: Physical Medicine and Rehabilitation

## 2020-02-16 ENCOUNTER — Ambulatory Visit: Payer: Self-pay

## 2020-02-16 ENCOUNTER — Other Ambulatory Visit: Payer: Self-pay

## 2020-02-16 ENCOUNTER — Encounter: Payer: Self-pay | Admitting: Physical Medicine and Rehabilitation

## 2020-02-16 VITALS — BP 112/73 | HR 75 | Ht 63.0 in | Wt 239.0 lb

## 2020-02-16 DIAGNOSIS — M7061 Trochanteric bursitis, right hip: Secondary | ICD-10-CM

## 2020-02-16 DIAGNOSIS — M7062 Trochanteric bursitis, left hip: Secondary | ICD-10-CM

## 2020-02-16 NOTE — Progress Notes (Signed)
   Numeric Pain Rating Scale and Functional Assessment Average Pain 6/7 out of 10   In the last MONTH (on 0-10 scale) has pain interfered with the following?  1. General activity like being  able to carry out your everyday physical activities such as walking, climbing stairs, carrying groceries, or moving a chair?  Rating(6.5/7 out of 10)   +Driver, -BT, -Dye Allergies.

## 2020-02-20 DIAGNOSIS — M7062 Trochanteric bursitis, left hip: Secondary | ICD-10-CM

## 2020-02-20 DIAGNOSIS — M7061 Trochanteric bursitis, right hip: Secondary | ICD-10-CM

## 2020-02-20 MED ORDER — BUPIVACAINE HCL 0.25 % IJ SOLN
4.0000 mL | INTRAMUSCULAR | Status: AC | PRN
  Administered 2020-02-20: 4 mL via INTRA_ARTICULAR

## 2020-02-20 MED ORDER — TRIAMCINOLONE ACETONIDE 40 MG/ML IJ SUSP
40.0000 mg | INTRAMUSCULAR | Status: AC | PRN
  Administered 2020-02-20: 40 mg via INTRA_ARTICULAR

## 2020-02-20 NOTE — Progress Notes (Signed)
   [name] - 39 y.o. female MRN [id number]  Date of birth: 1981/09/21  Office Visit Note: Visit Date: 02/16/2020 PCP: Lin Landsman, MD Referred by: Lin Landsman, MD  Subjective: Chief Complaint  Patient presents with  . Right Hip - Pain  . Left Hip - Pain   HPI:  Lauren Garza is a 39 y.o. female who comes in today For planned bilateral greater trochanteric bursa injections with fluoroscopic guidance.  Guidance needed [employer].  The patient has failed conservative care including home exercise, medications, time and activity modification.  This injection will be diagnostic and hopefully therapeutic.  Please see requesting physician notes for further details and justification.   ROS Otherwise per HPI.  Assessment & Plan: Visit Diagnoses:  1. Trochanteric bursitis, left hip   2. Trochanteric bursitis, right hip     Plan: No additional findings.   Meds & Orders: No orders of the defined types were placed in this encounter.   Orders Placed This Encounter  Procedures  . Large Joint Inj  . XR C-ARM NO REPORT    Follow-up: No follow-ups on file.   Procedures: Large Joint Inj: bilateral greater trochanter on 02/20/2020 8:43 AM Indications: pain and diagnostic evaluation Details: 22 G 3.5 in needle, fluoroscopy-guided lateral approach  Arthrogram: No  Medications (Right): 40 mg triamcinolone acetonide 40 MG/ML; 4 mL bupivacaine 0.25 % Medications (Left): 40 mg triamcinolone acetonide 40 MG/ML; 4 mL bupivacaine 0.25 % Outcome: tolerated well, no immediate complications  There was excellent flow of contrast outlined the greater trochanteric bursa without vascular uptake. Procedure, treatment alternatives, risks and benefits explained, specific risks discussed. Consent was given by the patient. Immediately prior to procedure a time out was called to verify the correct patient, procedure, equipment, support staff and site/side marked as required. Patient was  prepped and draped in the usual sterile fashion.      No notes on file   Clinical History: No specialty comments available.     Objective:  VS:  HT:5\' 3"  (160 cm)   WT:239 lb (108.4 kg)  BMI:42.35    BP:112/73  HR:75bpm  TEMP: ( )  RESP:  Physical Exam  Ortho Exam Imaging: No results found.

## 2020-02-22 ENCOUNTER — Other Ambulatory Visit: Payer: Self-pay | Admitting: Neurology

## 2020-02-24 ENCOUNTER — Ambulatory Visit
Admission: RE | Admit: 2020-02-24 | Discharge: 2020-02-24 | Disposition: A | Discharge status: Home / Self Care | Payer: 59 | Source: Ambulatory Visit | Attending: Neurology | Admitting: Neurology

## 2020-02-24 ENCOUNTER — Other Ambulatory Visit: Payer: Self-pay

## 2020-02-24 DIAGNOSIS — R635 Abnormal weight gain: Secondary | ICD-10-CM

## 2020-02-24 DIAGNOSIS — R350 Frequency of micturition: Secondary | ICD-10-CM

## 2020-02-24 DIAGNOSIS — M4807 Spinal stenosis, lumbosacral region: Secondary | ICD-10-CM

## 2020-02-24 DIAGNOSIS — M79604 Pain in right leg: Secondary | ICD-10-CM

## 2020-02-24 DIAGNOSIS — M79605 Pain in left leg: Secondary | ICD-10-CM

## 2020-02-24 DIAGNOSIS — R202 Paresthesia of skin: Secondary | ICD-10-CM

## 2020-02-24 DIAGNOSIS — M5416 Radiculopathy, lumbar region: Secondary | ICD-10-CM

## 2020-02-24 DIAGNOSIS — R29898 Other symptoms and signs involving the musculoskeletal system: Secondary | ICD-10-CM

## 2020-03-01 ENCOUNTER — Encounter: Payer: Self-pay | Admitting: Physical Medicine and Rehabilitation

## 2020-03-01 NOTE — Progress Notes (Signed)
Lauren Garza - 39 y.o. female MRN [id number]  Date of birth: 28-Sep-1981  Office Visit Note: Visit Date: 02/01/2020 PCP: Lin Landsman, MD Referred by: Lin Landsman, MD  Subjective: Chief Complaint  Patient presents with  . Lower Back - Pain  . Right Thigh - Pain, Numbness, Tingling  . Left Thigh - Tingling, Pain, Numbness   HPI: Lauren Garza is a 39 y.o. female who comes in today Has new patient consultation and evaluation management of bilateral thigh pain with numbness and tingling.  She comes in today at the request of Dr. Sarina Ill for possible treatment for what is been diagnosed as meralgia paresthetica.  Patient is morbidly obese.  She has had several years of bilateral thigh pain but also endorses some level of numbness and tingling in both thighs.  Nothing really past the knee.  She also endorses lower back pain and buttock pain.  She says the pain really started in October 2020 with no specific injury.  She has not had physical therapy or chiropractic care at this point.  This has been arranged by Dr. Lavell Anchors.  MRI of the lumbar spine is also ordered.  She has had CT scan of the pelvis which was not really worrisome.  She has not had electrodiagnostic study to confirm meralgia paresthetica but this likely would be a fairly hard study in her.  Her symptoms are fairly constant with burning type pain some tingling but also worse with standing.  She rates her pain as an 8 out of 10.  No focal weakness or foot drop.  No prior history of lumbar surgery.  No balance difficulties or fever chills or night sweats.  Review of Systems  Musculoskeletal: Positive for back pain.       Bilateral thigh pain  Neurological: Positive for tingling.  All other systems reviewed and are negative.  Otherwise per HPI.  Assessment & Plan: Visit Diagnoses:  1. Greater trochanteric bursitis, left   2. Greater trochanteric bursitis, right   3. Paresthesia of skin   4. Meralgia paresthetica  of both lower extremities     Plan: Findings:  Chronic worsening severe bilateral low back buttock and thigh pain with some paresthesia.  Differential diagnosis would be meralgia paresthetica which can happen in someone who is obese.  She has a lot of tenderness over the greater trochanters bilaterally I think this could just be really exacerbated greater trochanteric pain syndrome or bursitis.  Lastly on the list and we do not have the MRI of the look at but this could be more of a radicular type pain.  I think the first step is diagnostic fluoroscopically guided greater trochanteric injections do to her body habitus.  If she were to get a lot of relief of that and then at least diagnostically treatment could pursue in that area.  We will await MRI findings to see if there is anything from a lumbar standpoint.  Lastly could complete fluoroscopically guided nerve block of the lateral femoral cutaneous nerves or even consider L1 or 2 transforaminal block.    Meds & Orders: No orders of the defined types were placed in this encounter.  No orders of the defined types were placed in this encounter.   Follow-up: Return for Bilateral greater trochanteric injections with fluoroscopic guidance.   Procedures: No procedures performed  No notes on file   Clinical History: No specialty comments available.   She reports that she has quit smoking. She has never used smokeless  tobacco. No results for input(s): HGBA1C, LABURIC in the last 8760 hours.  Objective:  VS:  HT:5\' 3"  (160 cm)   WT:236 lb (107 kg)  BMI:41.82    BP:118/81  HR:79bpm  TEMP: ( )  RESP:  Physical Exam Vitals and nursing note reviewed.  Constitutional:      General: She is not in acute distress.    Appearance: Normal appearance. She is well-developed. She is obese. She is not ill-appearing.  HENT:     Head: Normocephalic and atraumatic.  Eyes:     Conjunctiva/sclera: Conjunctivae normal.     Pupils: Pupils are equal, round,  and reactive to light.  Cardiovascular:     Rate and Rhythm: Normal rate.     Pulses: Normal pulses.  Pulmonary:     Effort: Pulmonary effort is normal.  Musculoskeletal:     Right lower leg: No edema.     Left lower leg: No edema.     Comments: Patient somewhat slow to rise from a seated position does have some back pain with facet loading.  She is exquisitely tender over the greater trochanters bilaterally.  She seems to have some subjective paresthesia with trying to assist sensation on the thighs.  No frank decrease in sensation.  She has good distal strength.  Skin:    General: Skin is warm and dry.     Findings: No erythema or rash.  Neurological:     General: No focal deficit present.     Mental Status: She is alert and oriented to person, place, and time.     Sensory: No sensory deficit.     Motor: No weakness or abnormal muscle tone.     Coordination: Coordination normal.     Gait: Gait normal.  Psychiatric:        Mood and Affect: Mood normal.        Behavior: Behavior normal.     Ortho Exam Imaging: No results found.  Past Medical/Family/Surgical/Social History: Medications & Allergies reviewed per EMR, new medications updated. Patient Active Problem List   Diagnosis Date Noted  . Meralgia paresthetica of both lower extremities 01/16/2020  . Morbid obesity ([place]) 01/16/2020  . Abnormal laboratory test 03/09/2018  . Kidney stone on left side 02/06/2018  . S/P vaginal hysterectomy 02/01/2018  . Generalized headaches 10/25/2017   Past Medical History:  Diagnosis Date  . Abnormal Pap smear   . Acid reflux   . Asthma   . Bacterial vaginosis   . Chlamydia trachomatis infection 2012  . DUB (dysfunctional uterine bleeding)   . Family history of adverse reaction to anesthesia    mom has n and V past surgery  . Frequent headaches    migraines  . Hypertension    "not anymore", taken off BP meds per pt report  . Plantar fasciitis    right    Family History    Problem Relation Age of Onset  . Heart attack Father   . Hypertension Mother   . Diabetes Mother   . Other Mother        questionable nerve issue, has numbness in thighs too  . Diabetes Maternal Grandmother    Past Surgical History:  Procedure Laterality Date  . GYNECOLOGIC CRYOSURGERY    . I & D EXTREMITY Right 04/04/2015   Procedure: IRRIGATION AND DEBRIDEMENT RIGHT WRIST;  Surgeon: Leanora Cover, MD;  Location: [facility];  Service: Orthopedics;  Laterality: Right;  . SINUS ENDO WITH FUSION N/A 01/25/2017   Procedure: SINUS  ENDO WITH FUSION;  Surgeon: Melida Quitter, MD;  Location: [facility];  Service: ENT;  Laterality: N/A;  . TUBAL LIGATION    . VAGINAL HYSTERECTOMY Bilateral 02/01/2018   Procedure: HYSTERECTOMY VAGINAL WITH SALPINGECTOMY;  Surgeon: Chancy Milroy, MD;  Location: [facility];  Service: Gynecology;  Laterality: Bilateral;  . WISDOM TOOTH EXTRACTION    . WRIST SURGERY     Social History   Occupational History  . Not on file  Tobacco Use  . Smoking status: Former Research scientist (life sciences)  . Smokeless tobacco: Never Used  . Tobacco comment: rare smoker 9 years ago  Substance and Sexual Activity  . Alcohol use: Yes    Comment: Rare, 1x year  . Drug use: No  . Sexual activity: Yes    Birth control/protection: Surgical

## 2020-03-19 ENCOUNTER — Encounter (INDEPENDENT_AMBULATORY_CARE_PROVIDER_SITE_OTHER): Payer: Self-pay | Admitting: Family Medicine

## 2020-03-19 ENCOUNTER — Other Ambulatory Visit: Payer: Self-pay

## 2020-03-19 ENCOUNTER — Ambulatory Visit (INDEPENDENT_AMBULATORY_CARE_PROVIDER_SITE_OTHER): Payer: 59 | Admitting: Family Medicine

## 2020-03-19 VITALS — BP 114/80 | HR 65 | Temp 97.5°F | Ht 63.0 in | Wt 237.0 lb

## 2020-03-19 DIAGNOSIS — F3289 Other specified depressive episodes: Secondary | ICD-10-CM

## 2020-03-19 DIAGNOSIS — R5383 Other fatigue: Secondary | ICD-10-CM

## 2020-03-19 DIAGNOSIS — J4520 Mild intermittent asthma, uncomplicated: Secondary | ICD-10-CM | POA: Diagnosis not present

## 2020-03-19 DIAGNOSIS — D508 Other iron deficiency anemias: Secondary | ICD-10-CM

## 2020-03-19 DIAGNOSIS — I10 Essential (primary) hypertension: Secondary | ICD-10-CM | POA: Diagnosis not present

## 2020-03-19 DIAGNOSIS — Z0289 Encounter for other administrative examinations: Secondary | ICD-10-CM

## 2020-03-19 DIAGNOSIS — G5713 Meralgia paresthetica, bilateral lower limbs: Secondary | ICD-10-CM

## 2020-03-19 DIAGNOSIS — Z9189 Other specified personal risk factors, not elsewhere classified: Secondary | ICD-10-CM | POA: Diagnosis not present

## 2020-03-19 DIAGNOSIS — R739 Hyperglycemia, unspecified: Secondary | ICD-10-CM

## 2020-03-19 DIAGNOSIS — Z6841 Body Mass Index (BMI) 40.0 and over, adult: Secondary | ICD-10-CM

## 2020-03-19 DIAGNOSIS — R0602 Shortness of breath: Secondary | ICD-10-CM | POA: Diagnosis not present

## 2020-03-20 LAB — HEMOGLOBIN A1C
Est. average glucose Bld gHb Est-mCnc: 108 mg/dL
Hgb A1c MFr Bld: 5.4 % (ref 4.8–5.6)

## 2020-03-20 LAB — LIPID PANEL WITH LDL/HDL RATIO
Cholesterol, Total: 183 mg/dL (ref 100–199)
HDL: 59 mg/dL (ref >39)
LDL Chol Calc (NIH): 110 mg/dL — ABNORMAL HIGH (ref 0–99)
LDL/HDL Ratio: 1.9 ratio (ref 0.0–3.2)
Triglycerides: 76 mg/dL (ref 0–149)
VLDL Cholesterol Cal: 14 mg/dL (ref 5–40)

## 2020-03-20 LAB — CBC WITH DIFFERENTIAL/PLATELET
Basophils Absolute: 0 10*3/uL (ref 0.0–0.2)
Basos: 0 %
EOS (ABSOLUTE): 0.2 10*3/uL (ref 0.0–0.4)
Eos: 2 %
Hematocrit: 40.9 % (ref 34.0–46.6)
Hemoglobin: 13.1 g/dL (ref 11.1–15.9)
Immature Grans (Abs): 0 10*3/uL (ref 0.0–0.1)
Immature Granulocytes: 0 %
Lymphocytes Absolute: 2.2 10*3/uL (ref 0.7–3.1)
Lymphs: 24 %
MCH: 27.3 pg (ref 26.6–33.0)
MCHC: 32 g/dL (ref 31.5–35.7)
MCV: 85 fL (ref 79–97)
Monocytes Absolute: 0.6 10*3/uL (ref 0.1–0.9)
Monocytes: 7 %
Neutrophils Absolute: 6.1 10*3/uL (ref 1.4–7.0)
Neutrophils: 67 %
Platelets: 345 10*3/uL (ref 150–450)
RBC: 4.8 x10E6/uL (ref 3.77–5.28)
RDW: 13.1 % (ref 11.7–15.4)
WBC: 9.2 10*3/uL (ref 3.4–10.8)

## 2020-03-20 LAB — COMPREHENSIVE METABOLIC PANEL
ALT: 11 IU/L (ref 0–32)
AST: 18 IU/L (ref 0–40)
Albumin/Globulin Ratio: 1.6 (ref 1.2–2.2)
Albumin: 4.2 g/dL (ref 3.8–4.8)
Alkaline Phosphatase: 107 IU/L (ref 39–117)
BUN/Creatinine Ratio: 12 (ref 9–23)
BUN: 9 mg/dL (ref 6–20)
Bilirubin Total: 0.2 mg/dL (ref 0.0–1.2)
CO2: 25 mmol/L (ref 20–29)
Calcium: 9.3 mg/dL (ref 8.7–10.2)
Chloride: 100 mmol/L (ref 96–106)
Creatinine, Ser: 0.78 mg/dL (ref 0.57–1.00)
GFR calc Af Amer: 113 mL/min/{1.73_m2} (ref >59)
GFR calc non Af Amer: 98 mL/min/{1.73_m2} (ref >59)
Globulin, Total: 2.6 g/dL (ref 1.5–4.5)
Glucose: 75 mg/dL (ref 65–99)
Potassium: 4 mmol/L (ref 3.5–5.2)
Sodium: 138 mmol/L (ref 134–144)
Total Protein: 6.8 g/dL (ref 6.0–8.5)

## 2020-03-20 LAB — TSH: TSH: 1.36 u[IU]/mL (ref 0.450–4.500)

## 2020-03-20 LAB — VITAMIN B12: Vitamin B-12: 795 pg/mL (ref 232–1245)

## 2020-03-20 LAB — T3: T3, Total: 93 ng/dL (ref 71–180)

## 2020-03-20 LAB — VITAMIN D 25 HYDROXY (VIT D DEFICIENCY, FRACTURES): Vit D, 25-Hydroxy: 34.8 ng/mL (ref 30.0–100.0)

## 2020-03-20 LAB — INSULIN, RANDOM: INSULIN: 9.9 u[IU]/mL (ref 2.6–24.9)

## 2020-03-20 LAB — T4, FREE: Free T4: 1.11 ng/dL (ref 0.82–1.77)

## 2020-03-20 LAB — FOLATE: Folate: 9.3 ng/mL (ref >3.0)
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
## 2020-03-21 NOTE — Progress Notes (Signed)
Chief Complaint:   Lauren Garza (MR# [id number]) is a 39 y.o. female who presents for evaluation and treatment of obesity and related comorbidities. Current BMI is Body mass index is 41.98 kg/m. Lauren Garza has been struggling with her weight for many years and has been unsuccessful in either losing weight, maintaining weight loss, or reaching her healthy weight goal.  Lauren Garza is currently in the action stage of change and ready to dedicate time achieving and maintaining a healthier weight. Lauren Garza is interested in becoming our patient and working on intensive lifestyle modifications including (but not limited to) diet and exercise for weight loss.  Lauren Garza's habits were reviewed today and are as follows: her desired weight loss is 37 lbs, she has been heavy most of her life, she started gaining weight last year, her heaviest weight ever was 265 pounds, she has significant food cravings issues, she skips meals frequently, she is frequently drinking liquids with calories, she frequently makes poor food choices, she has problems with excessive hunger and she struggles with emotional eating.  Depression Screen Lauren Garza's Food and Mood (modified PHQ-9) score was 10.  Depression screen PHQ 2/9 03/19/2020  Decreased Interest 1  Down, Depressed, Hopeless 1  PHQ - 2 Score 2  Altered sleeping 1  Tired, decreased energy 3  Change in appetite 1  Feeling bad or failure about yourself  2  Trouble concentrating 1  Moving slowly or fidgety/restless 0  Suicidal thoughts 0  PHQ-9 Score 10  Difficult doing work/chores Somewhat difficult   Subjective:   1. Other fatigue Lauren Garza admits to daytime somnolence and admits to waking up still tired. Patent has a history of symptoms of daytime fatigue and morning headache. Lauren Garza generally gets 6.5 or 7 hours of sleep per night, and states that she has generally restful sleep. Snoring is not present. Apneic episodes are not  present. Epworth Sleepiness Score is 4.  2. SOB (shortness of breath) on exertion Lauren Garza notes increasing shortness of breath with exercising and seems to be worsening over time with weight gain. She notes getting out of breath sooner with activity than she used to. This has not gotten worse recently. Lauren Garza denies shortness of breath at rest or orthopnea.  3. Mild intermittent asthma, unspecified whether complicated Lauren Garza has asthma with a history of allergic rhinitis. Her symptoms are well controlled overall.  4. Essential hypertension Lauren Garza's blood pressure is stable with weight loss. She is working on diet and exercise, and she is taking and OTC supplement.  5. Hyperglycemia Lauren Garza has a history of mildly elevated glucose readings in the past. She notes polyphagia.  6. Other iron deficiency anemia Lauren Garza has had a low hemoglobin in the past. She has no recent labs, and she notes fatigue.  7. Meralgia parethetica, both lower extremities Lauren Garza is being evaluated by sports medicine and Neurology. She notes her pain has improved with bilateral injections.  8. Other depression, with emotional eating Lauren Garza notes emotional eating, especially when she is bored or stressed.  9. At risk for deficient intake of food The patient is at a higher than average risk of deficient intake of food due to recent intermittent fasting.  Assessment/Plan:   1. Other fatigue Lauren Garza does feel that her weight is causing her energy to be lower than it should be. Fatigue may be related to obesity, depression or many other causes. Labs will be ordered, and in the meanwhile, Lauren Garza will focus on self care including making healthy food choices,  increasing physical activity and focusing on stress reduction.  - EKG 12-Lead - CBC with Differential/Platelet - VITAMIN D 25 Hydroxy (Vit-D Deficiency, Fractures) - Vitamin B12 - Folate - T3 - T4, free - TSH  2. SOB (shortness of  breath) on exertion Lauren Garza does feel that she gets out of breath more easily that she used to when she exercises. Clarity's shortness of breath appears to be obesity related and exercise induced. She has agreed to work on weight loss and gradually increase exercise to treat her exercise induced shortness of breath. Will continue to monitor closely.  - Lipid Panel With LDL/HDL Ratio  3. Mild intermittent asthma, unspecified whether complicated Lauren Garza's IC shows low VO2, and is likely not related to asthma. Will continue to monitor.  4. Essential hypertension Lauren Garza will continue with diet and exercise, and will continue to work on healthy weight loss to improve blood pressure control. We will watch for signs of hypotension as she continues her lifestyle modifications. We will check labs today. Lauren Garza is to bring in her ingredient list for her supplements to her next visit.   - Comprehensive metabolic panel  5. Hyperglycemia Fasting labs will be obtained today and results with be discussed with Lauren Garza in 2 weeks at her follow up visit. In the meanwhile Lauren Garza was started on a lower simple carbohydrate diet and will work on weight loss efforts.  - Hemoglobin A1c - Insulin, random  6. Other iron deficiency anemia We will check labs today. Orders and follow up as documented in patient record.  7. Meralgia parethetica, both lower extremities Lauren Garza was advised that pain with pinched nerve can improve with weight loss, and stretches were given to her to help prevent another exacerbation.  8. Other depression, with emotional eating Behavior modification techniques were discussed today to help Lauren Garza deal with her emotional/non-hunger eating behaviors. We will refer to Dr. Mallie Mussel, our Bariatric Psychologist for evaluation. Orders and follow up as documented in patient record.   9. At risk for deficient intake of food Lauren Garza was given approximately 30 minutes of  deficit intake of food prevention counseling today. Lauren Garza is at risk for eating too few calories based on current food recall. She was encouraged to focus on meeting caloric and protein goals according to her recommended meal plan.   10. Class 3 severe obesity with serious comorbidity and body mass index (BMI) of 40.0 to 44.9 in adult, unspecified obesity type (HCC) Lauren Garza is currently in the action stage of change and her goal is to continue with weight loss efforts. I recommend Lauren Garza begin the structured treatment plan as follows:  She has agreed to the Category 2 Plan.  Exercise goals: No exercise has been prescribed for now, while we concentrate on nutritional changes.   Behavioral modification strategies: no skipping meals.  She was informed of the importance of frequent follow-up visits to maximize her success with intensive lifestyle modifications for her multiple health conditions. She was informed we would discuss her lab results at her next visit unless there is a critical issue that needs to be addressed sooner. Lauren Garza agreed to keep her next visit at the agreed upon time to discuss these results.  Objective:   Blood pressure 114/80, pulse 65, temperature (!) 97.5 F (36.4 C), temperature source Oral, height 5\' 3"  (1.6 m), weight 237 lb (107.5 kg), last menstrual period 02/11/2018, SpO2 100 %. Body mass index is 41.98 kg/m.  EKG: Normal sinus rhythm, rate 72 BPM.  Indirect Calorimeter completed today  shows a VO2 of 192 and a REE of 1338.  Her calculated basal metabolic rate is [id number] thus her basal metabolic rate is worse than expected.  General: Cooperative, alert, well developed, in no acute distress. HEENT: Conjunctivae and lids unremarkable. Cardiovascular: Regular rhythm.  Lungs: Normal work of breathing. Neurologic: No focal deficits.   Lab Results  Component Value Date   CREATININE 0.78 03/19/2020   BUN 9 03/19/2020   NA 138 03/19/2020   K 4.0  03/19/2020   CL 100 03/19/2020   CO2 25 03/19/2020   Lab Results  Component Value Date   ALT 11 03/19/2020   AST 18 03/19/2020   ALKPHOS 107 03/19/2020   BILITOT 0.2 03/19/2020   Lab Results  Component Value Date   HGBA1C 5.4 03/19/2020   Lab Results  Component Value Date   INSULIN 9.9 03/19/2020   Lab Results  Component Value Date   TSH 1.360 03/19/2020   Lab Results  Component Value Date   CHOL 183 03/19/2020   HDL 59 03/19/2020   LDLCALC 110 (H) 03/19/2020   TRIG 76 03/19/2020   Lab Results  Component Value Date   WBC 9.2 03/19/2020   HGB 13.1 03/19/2020   HCT 40.9 03/19/2020   MCV 85 03/19/2020   PLT 345 03/19/2020   No results found for: IRON, TIBC, FERRITIN  Attestation Statements:   Reviewed by clinician on day of visit: allergies, medications, problem list, medical history, surgical history, family history, social history, and previous encounter notes.   I, Trixie Dredge, am acting as transcriptionist for Dennard Nip, MD.  I have reviewed the above documentation for accuracy and completeness, and I agree with the above. - Dennard Nip, MD

## 2020-04-02 ENCOUNTER — Ambulatory Visit (INDEPENDENT_AMBULATORY_CARE_PROVIDER_SITE_OTHER): Payer: 59 | Admitting: Family Medicine

## 2020-04-02 ENCOUNTER — Encounter (INDEPENDENT_AMBULATORY_CARE_PROVIDER_SITE_OTHER): Payer: Self-pay | Admitting: Family Medicine

## 2020-04-02 ENCOUNTER — Other Ambulatory Visit: Payer: Self-pay

## 2020-04-02 VITALS — HR 73 | Temp 98.3°F | Ht 63.0 in | Wt 231.0 lb

## 2020-04-02 DIAGNOSIS — Z6841 Body Mass Index (BMI) 40.0 and over, adult: Secondary | ICD-10-CM

## 2020-04-02 DIAGNOSIS — E7849 Other hyperlipidemia: Secondary | ICD-10-CM | POA: Diagnosis not present

## 2020-04-02 DIAGNOSIS — Z9189 Other specified personal risk factors, not elsewhere classified: Secondary | ICD-10-CM

## 2020-04-02 DIAGNOSIS — E559 Vitamin D deficiency, unspecified: Secondary | ICD-10-CM

## 2020-04-02 DIAGNOSIS — E8881 Metabolic syndrome: Secondary | ICD-10-CM | POA: Diagnosis not present

## 2020-04-02 MED ORDER — VITAMIN D (ERGOCALCIFEROL) 1.25 MG (50000 UNIT) PO CAPS: 50000.0000 [IU] | ORAL_CAPSULE | ORAL | 0 refills | Status: DC

## 2020-04-02 NOTE — Progress Notes (Signed)
Chief Complaint:   OBESITY Lauren Garza is here to discuss her progress with her obesity treatment plan along with follow-up of her obesity related diagnoses. Lauren Garza is on the Category 2 Plan and states she is following her eating plan approximately 80% of the time. Lauren Garza states she is doing 0 minutes 0 times per week.  Today's visit was #: 2 Starting weight: 237 lbs Starting date: 03/19/2020 Today's weight: 231 lbs Today's date: 04/02/2020 Total lbs lost to date: 6 Total lbs lost since last in-office visit: 6  Interim History: Lauren Garza states she loved the eating plan. She denies excessive hunger or difficulties with meal prepping. She notes at the beginning she felt that she was over consuming daily vegetables. She then researched proper servings of vegetables.  Subjective:   1. Other hyperlipidemia Lauren Garza's lipid panel on 03/19/2020 had total cholesterol of 183, triglycerides 76, HDL 59, and LDL 110. She is not on statin. I discussed labs with the patient today.  2. Vitamin D deficiency Lauren Garza has a new diagnosis of Vit D deficiency. Her Vit D level was 34 on 03/19/2020. She is currently taking OTC multivitamins, but no other vitamin supplementation. I discussed labs with the patient today.  3. Insulin resistance Lauren Garza has a new diagnosis of insulin resistance. Her insulin level on 03/19/2020 was 9.9 and A1c of 5.4. She is not on metformin. I discussed labs with the patient today.  4. At risk for osteoporosis Lauren Garza is at higher risk of osteopenia and osteoporosis due to Vitamin D deficiency.   Assessment/Plan:   1. Other hyperlipidemia Cardiovascular risk and specific lipid/LDL goals reviewed. We discussed several lifestyle modifications today. Lauren Garza will continue her Category 2 meal plan, and will continue to work on exercise and weight loss efforts. We will recheck labs in 3 months. Orders and follow up as documented in patient record.   2. Vitamin D  deficiency Low Vitamin D level contributes to fatigue and are associated with obesity, breast, and colon cancer. Lauren Garza agreed to start prescription Vitamin D 50,000 IU every week with no refills. She will follow-up for routine testing of Vitamin D, at least 2-3 times per year to avoid over-replacement.  - Vitamin D, Ergocalciferol, (DRISDOL) 1.25 MG (50000 UNIT) CAPS capsule; Take 1 capsule (50,000 Units total) by mouth every 7 (seven) days.  Dispense: 4 capsule; Refill: 0  3. Insulin resistance Lauren Garza will continue her Category 2 meal plan, and will continue to work on weight loss, exercise, and decreasing simple carbohydrates to help decrease the risk of diabetes. We will recheck labs in 3 months. Lauren Garza agreed to follow-up with [place] as directed to closely monitor her progress.  4. At risk for osteoporosis Lauren Garza was given approximately 30 minutes of osteoporosis prevention counseling today. Lauren Garza is at risk for osteopenia and osteoporosis due to her Vitamin D deficiency. She was encouraged to take her Vitamin D and follow her higher calcium diet and increase strengthening exercise to help strengthen her bones and decrease her risk of osteopenia and osteoporosis.  Repetitive spaced learning was employed today to elicit superior memory formation and behavioral change.  5. Class 3 severe obesity with serious comorbidity and body mass index (BMI) of 40.0 to 44.9 in adult, unspecified obesity type (HCC) Lauren Garza is currently in the action stage of change. As such, her goal is to continue with weight loss efforts. She has agreed to the Category 2 Plan.   Exercise goals: No exercise has been prescribed at this time.  Behavioral  modification strategies: decreasing simple carbohydrates.  Lauren Garza has agreed to follow-up with our clinic in 2 weeks. She was informed of the importance of frequent follow-up visits to maximize her success with intensive lifestyle modifications for her  multiple health conditions.   Objective:   Pulse 73, temperature 98.3 F (36.8 C), temperature source Oral, height 5\' 3"  (1.6 m), weight 231 lb (104.8 kg), last menstrual period 02/11/2018, SpO2 100 %. Body mass index is 40.92 kg/m.  General: Cooperative, alert, well developed, in no acute distress. HEENT: Conjunctivae and lids unremarkable. Cardiovascular: Regular rhythm.  Lungs: Normal work of breathing. Neurologic: No focal deficits.   Lab Results  Component Value Date   CREATININE 0.78 03/19/2020   BUN 9 03/19/2020   NA 138 03/19/2020   K 4.0 03/19/2020   CL 100 03/19/2020   CO2 25 03/19/2020   Lab Results  Component Value Date   ALT 11 03/19/2020   AST 18 03/19/2020   ALKPHOS 107 03/19/2020   BILITOT 0.2 03/19/2020   Lab Results  Component Value Date   HGBA1C 5.4 03/19/2020   Lab Results  Component Value Date   INSULIN 9.9 03/19/2020   Lab Results  Component Value Date   TSH 1.360 03/19/2020   Lab Results  Component Value Date   CHOL 183 03/19/2020   HDL 59 03/19/2020   LDLCALC 110 (H) 03/19/2020   TRIG 76 03/19/2020   Lab Results  Component Value Date   WBC 9.2 03/19/2020   HGB 13.1 03/19/2020   HCT 40.9 03/19/2020   MCV 85 03/19/2020   PLT 345 03/19/2020   No results found for: IRON, TIBC, FERRITIN  Attestation Statements:   Reviewed by clinician on day of visit: allergies, medications, problem list, medical history, surgical history, family history, social history, and previous encounter notes.   I, Trixie Dredge, am acting as transcriptionist for Dennard Nip, MD.  I have reviewed the above documentation for accuracy and completeness, and I agree with the above. -  Dennard Nip, MD

## 2020-04-03 ENCOUNTER — Telehealth (INDEPENDENT_AMBULATORY_CARE_PROVIDER_SITE_OTHER): Payer: 59 | Admitting: Psychology

## 2020-04-03 DIAGNOSIS — F5089 Other specified eating disorder: Secondary | ICD-10-CM

## 2020-04-17 ENCOUNTER — Ambulatory Visit (INDEPENDENT_AMBULATORY_CARE_PROVIDER_SITE_OTHER): Payer: 59 | Admitting: Family Medicine

## 2020-04-17 ENCOUNTER — Encounter (INDEPENDENT_AMBULATORY_CARE_PROVIDER_SITE_OTHER): Payer: Self-pay | Admitting: Family Medicine

## 2020-04-17 ENCOUNTER — Other Ambulatory Visit: Payer: Self-pay

## 2020-04-17 VITALS — BP 125/76 | HR 73 | Temp 98.6°F | Ht 63.0 in | Wt 224.0 lb

## 2020-04-17 DIAGNOSIS — E8881 Metabolic syndrome: Secondary | ICD-10-CM | POA: Diagnosis not present

## 2020-04-17 DIAGNOSIS — Z6839 Body mass index (BMI) 39.0-39.9, adult: Secondary | ICD-10-CM

## 2020-04-17 DIAGNOSIS — Z9189 Other specified personal risk factors, not elsewhere classified: Secondary | ICD-10-CM

## 2020-04-17 DIAGNOSIS — E559 Vitamin D deficiency, unspecified: Secondary | ICD-10-CM | POA: Diagnosis not present

## 2020-04-17 MED ORDER — VITAMIN D (ERGOCALCIFEROL) 1.25 MG (50000 UNIT) PO CAPS: 50000.0000 [IU] | ORAL_CAPSULE | ORAL | 0 refills | Status: DC

## 2020-04-17 NOTE — Progress Notes (Signed)
Chief Complaint:   OBESITY Lauren Garza is here to discuss her progress with her obesity treatment plan along with follow-up of her obesity related diagnoses. Lauren Garza is on the Category 2 Plan and states she is following her eating plan approximately 93% of the time. Lauren Garza states she is doing 0 minutes 0 times per week.  Today's visit was #: 3 Starting weight: 237 lbs Starting date: 03/19/2020 Today's weight: 224 lbs Today's date: 04/17/2020 Total lbs lost to date: 13 Total lbs lost since last in-office visit: 7  Interim History: Lauren Garza really likes the Category 2 meal plan and she has been really focused on consistently eating on the plan. She denies excessive hunger levels or cravings throughout the day.  Subjective:   1. Vitamin D deficiency Lauren Garza's Vit D level on 03/19/2020 was 34.8. She is on prescription strength Vit D supplementation, and is tolerating it well.  2. Insulin resistance Lauren Garza's insulin level on 03/19/2020 was 9.9. She is not on metformin. She has a significant family history of diabetes.   3. At risk for heart disease Lauren Garza is at a higher than average risk for cardiovascular disease due to obesity and insulin resistance. She has a history of hypertension, and she is not currently on anti-hypertensive.   Assessment/Plan:   1. Vitamin D deficiency Low Vitamin D level contributes to fatigue and are associated with obesity, breast, and colon cancer. We will refill prescription Vitamin D for 1 month. Lauren Garza will follow-up for routine testing of Vitamin D, at least 2-3 times per year to avoid over-replacement. We will recheck labs every 3 months.   - Vitamin D, Ergocalciferol, (DRISDOL) 1.25 MG (50000 UNIT) CAPS capsule; Take 1 capsule (50,000 Units total) by mouth every 7 (seven) days.  Dispense: 4 capsule; Refill: 0  2. Insulin resistance Lauren Garza will continue her Category 3 meal plan, and will continue to work on weight loss, exercise,  and decreasing simple carbohydrates to help decrease the risk of diabetes. We will recheck labs every 3 months. Lauren Garza agreed to follow-up with [place] as directed to closely monitor her progress.  3. At risk for heart disease Lauren Garza was given approximately 15 minutes of coronary artery disease prevention counseling today. She is 39 y.o. female and has risk factors for heart disease including obesity and insulin resistance. We discussed intensive lifestyle modifications today with an emphasis on specific weight loss instructions and strategies.   Repetitive spaced learning was employed today to elicit superior memory formation and behavioral change.  4. Class 2 severe obesity with serious comorbidity and body mass index (BMI) of 39.0 to 39.9 in adult, unspecified obesity type (HCC) Lauren Garza is currently in the action stage of change. As such, her goal is to continue with weight loss efforts. She has agreed to the Category 2 Plan.   Handout provided today: Additional breakfast options.  Exercise goals: No exercise has been prescribed at this time.  Behavioral modification strategies: increasing lean protein intake, decreasing simple carbohydrates, meal planning and cooking strategies and better snacking choices.  Lauren Garza has agreed to follow-up with our clinic in 2 weeks with [facility], FNP-C. She was informed of the importance of frequent follow-up visits to maximize her success with intensive lifestyle modifications for her multiple health conditions.   Objective:   Blood pressure 125/76, pulse 73, temperature 98.6 F (37 C), temperature source Oral, height 5\' 3"  (1.6 m), weight 224 lb (101.6 kg), last menstrual period 02/11/2018, SpO2 100 %. Body mass index is 39.68 kg/m.  General: Cooperative, alert, well developed, in no acute distress. HEENT: Conjunctivae and lids unremarkable. Cardiovascular: Regular rhythm.  Lungs: Normal work of breathing. Neurologic: No focal deficits.    Lab Results  Component Value Date   CREATININE 0.78 03/19/2020   BUN 9 03/19/2020   NA 138 03/19/2020   K 4.0 03/19/2020   CL 100 03/19/2020   CO2 25 03/19/2020   Lab Results  Component Value Date   ALT 11 03/19/2020   AST 18 03/19/2020   ALKPHOS 107 03/19/2020   BILITOT 0.2 03/19/2020   Lab Results  Component Value Date   HGBA1C 5.4 03/19/2020   Lab Results  Component Value Date   INSULIN 9.9 03/19/2020   Lab Results  Component Value Date   TSH 1.360 03/19/2020   Lab Results  Component Value Date   CHOL 183 03/19/2020   HDL 59 03/19/2020   LDLCALC 110 (H) 03/19/2020   TRIG 76 03/19/2020   Lab Results  Component Value Date   WBC 9.2 03/19/2020   HGB 13.1 03/19/2020   HCT 40.9 03/19/2020   MCV 85 03/19/2020   PLT 345 03/19/2020   No results found for: IRON, TIBC, FERRITIN  Attestation Statements:   Reviewed by clinician on day of visit: allergies, medications, problem list, medical history, surgical history, family history, social history, and previous encounter notes.   I, Trixie Dredge, am acting as transcriptionist for Dennard Nip, MD.  I have reviewed the above documentation for accuracy and completeness, and I agree with the above. -  Dennard Nip, MD

## 2020-04-30 ENCOUNTER — Other Ambulatory Visit (INDEPENDENT_AMBULATORY_CARE_PROVIDER_SITE_OTHER): Payer: Self-pay | Admitting: Family Medicine

## 2020-04-30 DIAGNOSIS — E559 Vitamin D deficiency, unspecified: Secondary | ICD-10-CM

## 2020-05-02 ENCOUNTER — Encounter (INDEPENDENT_AMBULATORY_CARE_PROVIDER_SITE_OTHER): Payer: Self-pay | Admitting: Family Medicine

## 2020-05-02 ENCOUNTER — Other Ambulatory Visit: Payer: Self-pay

## 2020-05-02 ENCOUNTER — Ambulatory Visit (INDEPENDENT_AMBULATORY_CARE_PROVIDER_SITE_OTHER): Payer: 59 | Admitting: Family Medicine

## 2020-05-02 VITALS — BP 105/72 | HR 79 | Temp 99.2°F | Ht 63.0 in | Wt 221.0 lb

## 2020-05-02 DIAGNOSIS — Z6839 Body mass index (BMI) 39.0-39.9, adult: Secondary | ICD-10-CM

## 2020-05-02 DIAGNOSIS — E8881 Metabolic syndrome: Secondary | ICD-10-CM | POA: Diagnosis not present

## 2020-05-06 ENCOUNTER — Encounter (INDEPENDENT_AMBULATORY_CARE_PROVIDER_SITE_OTHER): Payer: Self-pay | Admitting: Family Medicine

## 2020-05-06 DIAGNOSIS — E8881 Metabolic syndrome: Secondary | ICD-10-CM | POA: Insufficient documentation

## 2020-05-06 DIAGNOSIS — E88819 Insulin resistance, unspecified: Secondary | ICD-10-CM | POA: Insufficient documentation

## 2020-05-06 NOTE — Progress Notes (Signed)
Chief Complaint:   OBESITY Lauren Garza is here to discuss her progress with her obesity treatment plan along with follow-up of her obesity related diagnoses. Lauren Garza is on the Category 2 Plan and states she is following her eating plan approximately 95% of the time. Lauren Garza states she is doing 0 minutes 0 times per week.  Today's visit was #: 4 Starting weight: 224 lbs Starting date: 03/19/2020 Today's weight: 221 lbs Today's date: 05/02/2020 Total lbs lost to date: 3 Total lbs lost since last in-office visit: 3  Interim History: Lauren Garza is happy with the plan. She really likes the Special K protein cereal and [employer] milk. She notes some hunger but she tends to have a snack when this happens. She has mostly cut out sweet tea. She notes that she has skipped a few meals.  Subjective:   1. Insulin resistance Lauren Garza has a diagnosis of insulin resistance based on her elevated fasting insulin level >5. She is not on metformin. She notes mild polyphagia 1 hour before meals. She continues to work on diet and exercise to decrease her risk of diabetes.  Lab Results  Component Value Date   INSULIN 9.9 03/19/2020   Lab Results  Component Value Date   HGBA1C 5.4 03/19/2020   Assessment/Plan:   1. Insulin resistance Lauren Garza will continue her meal plan, and will continue to work on weight loss, exercise, and decreasing simple carbohydrates to help decrease the risk of diabetes. Lauren Garza agreed to follow-up with [place] as directed to closely monitor her progress.  2. Class 2 severe obesity with serious comorbidity and body mass index (BMI) of 39.0 to 39.9 in adult, unspecified obesity type (HCC) Lauren Garza is currently in the action stage of change. As such, her goal is to continue with weight loss efforts. She has agreed to the Category 2 Plan.   Exercise goals: Lauren Garza will consider starting exercise.  Behavioral modification strategies: increasing water intake and no skipping  meals.  Lauren Garza has agreed to follow-up with our clinic in 2 weeks. She was informed of the importance of frequent follow-up visits to maximize her success with intensive lifestyle modifications for her multiple health conditions.   Objective:   Blood pressure 105/72, pulse 79, temperature 99.2 F (37.3 C), temperature source Oral, height 5\' 3"  (1.6 m), weight 221 lb (100.2 kg), last menstrual period 02/11/2018, SpO2 100 %. Body mass index is 39.15 kg/m.  General: Cooperative, alert, well developed, in no acute distress. HEENT: Conjunctivae and lids unremarkable. Cardiovascular: Regular rhythm.  Lungs: Normal work of breathing. Neurologic: No focal deficits.   Lab Results  Component Value Date   CREATININE 0.78 03/19/2020   BUN 9 03/19/2020   NA 138 03/19/2020   K 4.0 03/19/2020   CL 100 03/19/2020   CO2 25 03/19/2020   Lab Results  Component Value Date   ALT 11 03/19/2020   AST 18 03/19/2020   ALKPHOS 107 03/19/2020   BILITOT 0.2 03/19/2020   Lab Results  Component Value Date   HGBA1C 5.4 03/19/2020   Lab Results  Component Value Date   INSULIN 9.9 03/19/2020   Lab Results  Component Value Date   TSH 1.360 03/19/2020   Lab Results  Component Value Date   CHOL 183 03/19/2020   HDL 59 03/19/2020   LDLCALC 110 (H) 03/19/2020   TRIG 76 03/19/2020   Lab Results  Component Value Date   WBC 9.2 03/19/2020   HGB 13.1 03/19/2020   HCT 40.9 03/19/2020  MCV 85 03/19/2020   PLT 345 03/19/2020   No results found for: IRON, TIBC, FERRITIN  Attestation Statements:   Reviewed by clinician on day of visit: allergies, medications, problem list, medical history, surgical history, family history, social history, and previous encounter notes.   Wilhemena Durie, am acting as Location manager for [employer], FNP-C.  I have reviewed the above documentation for accuracy and completeness, and I agree with the above. -  Georgianne Fick, FNP

## 2020-05-20 ENCOUNTER — Ambulatory Visit (INDEPENDENT_AMBULATORY_CARE_PROVIDER_SITE_OTHER): Payer: 59 | Admitting: Adult Health

## 2020-05-20 ENCOUNTER — Other Ambulatory Visit: Payer: Self-pay

## 2020-05-20 VITALS — BP 112/77 | HR 80 | Temp 98.5°F | Ht 63.0 in | Wt 216.0 lb

## 2020-05-20 DIAGNOSIS — Z6838 Body mass index (BMI) 38.0-38.9, adult: Secondary | ICD-10-CM

## 2020-05-20 DIAGNOSIS — Z9189 Other specified personal risk factors, not elsewhere classified: Secondary | ICD-10-CM | POA: Diagnosis not present

## 2020-05-20 DIAGNOSIS — E559 Vitamin D deficiency, unspecified: Secondary | ICD-10-CM

## 2020-05-20 DIAGNOSIS — J45909 Unspecified asthma, uncomplicated: Secondary | ICD-10-CM

## 2020-05-20 MED ORDER — VITAMIN D (ERGOCALCIFEROL) 1.25 MG (50000 UNIT) PO CAPS: 50000.0000 [IU] | ORAL_CAPSULE | ORAL | 0 refills | Status: DC

## 2020-05-21 DIAGNOSIS — E559 Vitamin D deficiency, unspecified: Secondary | ICD-10-CM | POA: Insufficient documentation

## 2020-05-21 DIAGNOSIS — J45909 Unspecified asthma, uncomplicated: Secondary | ICD-10-CM | POA: Insufficient documentation

## 2020-05-21 NOTE — Progress Notes (Signed)
Chief Complaint:   Lauren Garza is here to discuss her progress with her Lauren treatment plan along with follow-up of her Lauren related diagnoses. Lauren Garza is on the Category 2 Plan and states she is following her eating plan approximately 95% of the time. Lauren Garza states she is exercising 0 minutes 0 times per week.  Today's visit was #: 5 Starting weight: 237 lbs Starting date: 03/19/2020 Today's weight: 216 lbs Today's date: 05/20/2020 Total lbs lost to date: 21 Total lbs lost since last in-office visit: 5  Interim History: Lauren Garza continues to enjoy the protein cereal and will have it most mornings of the week. She has been consistently eating on plan with only one deviation with a potluck party at work last week.  Subjective:   Vitamin D deficiency. Lauren Garza is on prescription Vitamin D supplementation. Last Vitamin D was 34.8 on 03/19/2020.  Asthma, unspecified asthma severity, unspecified whether complicated, unspecified whether persistent. Asthma is well controlled on daily Symbicort. Lauren Garza denies tobacco or vape use.  At risk for activity intolerance. Lauren Garza is at risk of exercise intolerance due to asthma, which is well controlled.  Assessment/Plan:   Vitamin D deficiency. Low Vitamin D level contributes to fatigue and are associated with Lauren, breast, and colon cancer. She was given a refill on her Vitamin D, Ergocalciferol, (DRISDOL) 1.25 MG (50000 UNIT) CAPS capsule every week #4 with 0 refills and will follow-up for routine testing of Vitamin D, at least 2-3 times per year to avoid over-replacement.   Asthma, unspecified asthma severity, unspecified whether complicated, unspecified whether persistent. Lauren Garza will continue her current inhaler therapy.  At risk for activity intolerance. Lauren Garza was given approximately 15 minutes of exercise intolerance counseling today. She is 39 y.o. female and has risk factors exercise  intolerance including Lauren. We discussed intensive lifestyle modifications today with an emphasis on specific weight loss instructions and strategies. Lauren Garza will slowly increase activity as tolerated.  Repetitive spaced learning was employed today to elicit superior memory formation and behavioral change.  Class 2 severe Lauren with serious comorbidity and body mass index (BMI) of 38.0 to 38.9 in adult, unspecified Lauren type ([place]).  Lauren Garza is currently in the action stage of change. As such, her goal is to continue with weight loss efforts. She has agreed to the Category 2 Plan.   Handout was provided for Protein Equivalents.  Exercise goals: No exercise has been prescribed at this time.  Behavioral modification strategies: increasing lean protein intake, no skipping meals and meal planning and cooking strategies.  Lauren Garza has agreed to follow-up with our clinic in 2 weeks. She was informed of the importance of frequent follow-up visits to maximize her success with intensive lifestyle modifications for her multiple health conditions.   Objective:   Blood pressure 112/77, pulse 80, temperature 98.5 F (36.9 C), temperature source Oral, height 5\' 3"  (1.6 m), weight 216 lb (98 kg), last menstrual period 02/11/2018, SpO2 100 %. Body mass index is 38.26 kg/m.  General: Cooperative, alert, well developed, in no acute distress. HEENT: Conjunctivae and lids unremarkable. Cardiovascular: Regular rhythm.  Lungs: Normal work of breathing. Neurologic: No focal deficits.   Lab Results  Component Value Date   CREATININE 0.78 03/19/2020   BUN 9 03/19/2020   NA 138 03/19/2020   K 4.0 03/19/2020   CL 100 03/19/2020   CO2 25 03/19/2020   Lab Results  Component Value Date   ALT 11 03/19/2020   AST 18 03/19/2020  ALKPHOS 107 03/19/2020   BILITOT 0.2 03/19/2020   Lab Results  Component Value Date   HGBA1C 5.4 03/19/2020   Lab Results  Component Value Date   INSULIN 9.9  03/19/2020   Lab Results  Component Value Date   TSH 1.360 03/19/2020   Lab Results  Component Value Date   CHOL 183 03/19/2020   HDL 59 03/19/2020   LDLCALC 110 (H) 03/19/2020   TRIG 76 03/19/2020   Lab Results  Component Value Date   WBC 9.2 03/19/2020   HGB 13.1 03/19/2020   HCT 40.9 03/19/2020   MCV 85 03/19/2020   PLT 345 03/19/2020   No results found for: IRON, TIBC, FERRITIN  Attestation Statements:   Reviewed by clinician on day of visit: allergies, medications, problem list, medical history, surgical history, family history, social history, and previous encounter notes.  I, Michaelene Song, am acting as Location manager for [employer], NP-C   I have reviewed the above documentation for accuracy and completeness, and I agree with the above. -  Esaw Grandchild, NP

## 2020-05-24 ENCOUNTER — Other Ambulatory Visit (INDEPENDENT_AMBULATORY_CARE_PROVIDER_SITE_OTHER): Payer: Self-pay | Admitting: Family Medicine

## 2020-05-24 ENCOUNTER — Encounter (INDEPENDENT_AMBULATORY_CARE_PROVIDER_SITE_OTHER): Payer: Self-pay | Admitting: Adult Health

## 2020-05-24 DIAGNOSIS — E559 Vitamin D deficiency, unspecified: Secondary | ICD-10-CM

## 2020-06-04 ENCOUNTER — Ambulatory Visit (INDEPENDENT_AMBULATORY_CARE_PROVIDER_SITE_OTHER): Payer: 59 | Admitting: Adult Health

## 2020-06-05 ENCOUNTER — Encounter (INDEPENDENT_AMBULATORY_CARE_PROVIDER_SITE_OTHER): Payer: Self-pay | Admitting: Adult Health

## 2020-06-05 ENCOUNTER — Ambulatory Visit (INDEPENDENT_AMBULATORY_CARE_PROVIDER_SITE_OTHER): Payer: 59 | Admitting: Adult Health

## 2020-06-05 ENCOUNTER — Other Ambulatory Visit: Payer: Self-pay

## 2020-06-05 VITALS — BP 107/73 | HR 77 | Temp 98.0°F | Ht 63.0 in | Wt 216.0 lb

## 2020-06-05 DIAGNOSIS — E8881 Metabolic syndrome: Secondary | ICD-10-CM

## 2020-06-05 DIAGNOSIS — Z6838 Body mass index (BMI) 38.0-38.9, adult: Secondary | ICD-10-CM

## 2020-06-05 DIAGNOSIS — E559 Vitamin D deficiency, unspecified: Secondary | ICD-10-CM

## 2020-06-05 NOTE — Progress Notes (Signed)
Chief Complaint:   OBESITY Lauren Garza is here to discuss her progress with her obesity treatment plan along with follow-up of her obesity related diagnoses. Lauren Garza is on the Category 2 Plan and states she is following her eating plan approximately 95% of the time. Lauren Garza states she is doing weights 10-15 minutes 5 times per week.  Today's visit was #: 6 Starting weight: 237 lbs Starting date: 03/19/2020 Today's weight: 216 lbs Today's date: 06/05/2020 Total lbs lost to date: 21 Total lbs lost since last in-office visit: 0  Interim History: Lauren Garza traveled to [place] to celebrate [place] with her family and she was able to stay on plan by focusing on protein and limiting her carbohydrate/sugar intake. She is completing a course of Augmentin to treat sinusitis.  Subjective:   Vitamin D deficiency. Lauren Garza is on prescription strength Vitamin D supplementation and is tolerating it well. No nausea, vomiting, or muscle weakness.    Ref. Range 03/19/2020 11:40  Vitamin D, 25-Hydroxy Latest Ref Range: 30.0 - 100.0 ng/mL 34.8   Insulin resistance. Lauren Garza has a diagnosis of insulin resistance based on her elevated fasting insulin level >5. She continues to work on diet and exercise to decrease her risk of diabetes. Lauren Garza is not on metformin and denies polyphagia.  Lab Results  Component Value Date   INSULIN 9.9 03/19/2020   Lab Results  Component Value Date   HGBA1C 5.4 03/19/2020   Assessment/Plan:   Vitamin D deficiency. Low Vitamin D level contributes to fatigue and are associated with obesity, breast, and colon cancer. She agrees to continue to take prescription Vitamin D as directed (no medication refill today) and will follow-up for routine testing of Vitamin D at her next office visit.  Insulin resistance. Lauren Garza will continue to work on weight loss, exercise, and decreasing simple carbohydrates to help decrease the risk of diabetes. Lauren Garza  agreed to follow-up with [place] as directed to closely monitor her progress. She will continue to follow the Category 2 meal plan and will have labs checked at the time of her next office visit.  Class 2 severe obesity with serious comorbidity and body mass index (BMI) of 38.0 to 38.9 in adult, unspecified obesity type ([place]).  Lauren Garza is currently in the action stage of change. As such, her goal is to continue with weight loss efforts. She has agreed to the Category 2 Plan.   Exercise goals: Lauren Garza will continue her current exercise regimen.   Behavioral modification strategies: increasing lean protein intake, meal planning and cooking strategies, holiday eating strategies  and celebration eating strategies.  Lauren Garza has agreed to follow-up with our clinic in 2 weeks. She was informed of the importance of frequent follow-up visits to maximize her success with intensive lifestyle modifications for her multiple health conditions.   Objective:   Blood pressure 107/73, pulse 77, temperature 98 F (36.7 C), temperature source Oral, height 5\' 3"  (1.6 m), weight 216 lb (98 kg), last menstrual period 02/11/2018, SpO2 97 %. Body mass index is 38.26 kg/m.  General: Cooperative, alert, well developed, in no acute distress. HEENT: Conjunctivae and lids unremarkable. Cardiovascular: Regular rhythm.  Lungs: Normal work of breathing. Neurologic: No focal deficits.   Lab Results  Component Value Date   CREATININE 0.78 03/19/2020   BUN 9 03/19/2020   NA 138 03/19/2020   K 4.0 03/19/2020   CL 100 03/19/2020   CO2 25 03/19/2020   Lab Results  Component Value Date   ALT 11 03/19/2020  AST 18 03/19/2020   ALKPHOS 107 03/19/2020   BILITOT 0.2 03/19/2020   Lab Results  Component Value Date   HGBA1C 5.4 03/19/2020   Lab Results  Component Value Date   INSULIN 9.9 03/19/2020   Lab Results  Component Value Date   TSH 1.360 03/19/2020   Lab Results  Component Value Date   CHOL 183  03/19/2020   HDL 59 03/19/2020   LDLCALC 110 (H) 03/19/2020   TRIG 76 03/19/2020   Lab Results  Component Value Date   WBC 9.2 03/19/2020   HGB 13.1 03/19/2020   HCT 40.9 03/19/2020   MCV 85 03/19/2020   PLT 345 03/19/2020   No results found for: IRON, TIBC, FERRITIN  Attestation Statements:   Reviewed by clinician on day of visit: allergies, medications, problem list, medical history, surgical history, family history, social history, and previous encounter notes.  Time spent on visit including pre-visit chart review and post-visit charting and care was 24 minutes.   I, Michaelene Song, am acting as Location manager for [employer], NP-C   I have reviewed the above documentation for accuracy and completeness, and I agree with the above. -  Esaw Grandchild, NP

## 2020-06-19 ENCOUNTER — Ambulatory Visit (INDEPENDENT_AMBULATORY_CARE_PROVIDER_SITE_OTHER): Payer: 59 | Admitting: Family Medicine

## 2020-06-19 ENCOUNTER — Encounter (INDEPENDENT_AMBULATORY_CARE_PROVIDER_SITE_OTHER): Payer: Self-pay | Admitting: Family Medicine

## 2020-06-19 ENCOUNTER — Other Ambulatory Visit: Payer: Self-pay

## 2020-06-19 VITALS — BP 118/80 | HR 77 | Temp 98.3°F | Ht 63.0 in | Wt 214.0 lb

## 2020-06-19 DIAGNOSIS — E538 Deficiency of other specified B group vitamins: Secondary | ICD-10-CM

## 2020-06-19 DIAGNOSIS — Z6838 Body mass index (BMI) 38.0-38.9, adult: Secondary | ICD-10-CM

## 2020-06-19 DIAGNOSIS — E559 Vitamin D deficiency, unspecified: Secondary | ICD-10-CM

## 2020-06-19 DIAGNOSIS — E7849 Other hyperlipidemia: Secondary | ICD-10-CM | POA: Diagnosis not present

## 2020-06-19 DIAGNOSIS — Z9189 Other specified personal risk factors, not elsewhere classified: Secondary | ICD-10-CM | POA: Diagnosis not present

## 2020-06-19 DIAGNOSIS — E8881 Metabolic syndrome: Secondary | ICD-10-CM

## 2020-06-19 DIAGNOSIS — K5909 Other constipation: Secondary | ICD-10-CM | POA: Diagnosis not present

## 2020-06-19 MED ORDER — VITAMIN D (ERGOCALCIFEROL) 1.25 MG (50000 UNIT) PO CAPS: 50000.0000 [IU] | ORAL_CAPSULE | ORAL | 0 refills | Status: DC

## 2020-06-19 NOTE — Progress Notes (Signed)
Chief Complaint:   OBESITY Lauren Garza is here to discuss her progress with her obesity treatment plan along with follow-up of her obesity related diagnoses. Lauren Garza is on the Category 2 Plan and states she is following her eating plan approximately 95% of the time. [name] states she is exercising for 0 minutes 0 times per week.  Today's visit was #: 7 Starting weight: 237 lbs Starting date: 03/19/2020 Today's weight: 214 lbs Today's date: 06/20/2020 Total lbs lost to date: 23 lbs Total lbs lost since last in-office visit: 2 lbs  Interim History: [name] says she likes the plan.  She says it is going well.  If she goes off plan, it is when she goes out to dinner.  She says she went to [employer] and asked for a grilled chicken pita and asked for no mayo, add mustard.  She reports being very mindful of what she puts in her body and has a strong desire to get healthy and not become diabetic like others in her family.  Denies cravings, hunger, or concerns!  Subjective:   1. Chronic constipation [name] notes constipation.  She is on Linzess and says she occasionally takes it.  She drinks, on average, 2-3 bottles of water per day.  2. Vitamin D deficiency Lauren Garza's Vitamin D level was 34.8 on 03/19/2020. She is currently taking prescription vitamin D 50,000 IU each week. She denies nausea, vomiting or muscle weakness.  3. Other hyperlipidemia Lauren Garza has hyperlipidemia and has been trying to improve her cholesterol levels with intensive lifestyle modification including a low saturated fat diet, exercise and weight loss. She denies any chest pain, claudication or myalgias.  Lab Results  Component Value Date   ALT 11 03/19/2020   AST 18 03/19/2020   ALKPHOS 107 03/19/2020   BILITOT 0.2 03/19/2020   Lab Results  Component Value Date   CHOL 178 06/19/2020   HDL 47 06/19/2020   LDLCALC 122 (H) 06/19/2020   TRIG 46 06/19/2020   4. Vitamin B12 deficiency She is not a  vegetarian.  She does not have a previous diagnosis of pernicious anemia.  She does not have a history of weight loss surgery.  She says she is supposed to get an injection monthly, but she is not always taking it that way.  Lab Results  Component Value Date   VITAMINB12 1,587 (H) 06/19/2020   5. Insulin resistance Lauren Garza has a diagnosis of insulin resistance based on her elevated fasting insulin level >5. She continues to work on diet and exercise to decrease her risk of diabetes.  Lab Results  Component Value Date   INSULIN 8.7 06/19/2020   INSULIN 9.9 03/19/2020   Lab Results  Component Value Date   HGBA1C 5.4 03/19/2020   6. At risk for constipation Lauren Garza is at increased risk for constipation due to inadequate water intake, changes in diet, and/or use of medications such as GLP1 agonists. Lauren Garza denies hard, infrequent stools currently.   Assessment/Plan:   1. Chronic constipation Frank was informed that a decrease in bowel movement frequency is normal while losing weight, but stools should not be hard or painful. Orders and follow up as documented in patient record.  Goal is 100 ounces of no caffeine, salt-free, calorie free/low beverages per day.  Counseling Getting to Good Bowel Health: Your goal is to have one soft bowel movement each day. Drink at least 8 glasses of water each day. Eat plenty of fiber (goal is over 25 grams each day). It  is best to get most of your fiber from dietary sources which includes leafy green vegetables, fresh fruit, and whole grains. You may need to add fiber with the help of OTC fiber supplements. These include Metamucil, Citrucel, and Flaxseed. If you are still having trouble, try adding Miralax or Magnesium Citrate. If all of these changes do not work, Cabin crew.  2. Vitamin D deficiency Discussed labs with patient today.  Low Vitamin D level contributes to fatigue and are associated with obesity, breast, and colon  cancer. She agrees to continue to take prescription Vitamin D @50 ,000 IU every week and will follow-up for routine testing of Vitamin D, at least 2-3 times per year to avoid over-replacement.  Will recheck vitamin D level today. - Vitamin D, Ergocalciferol, (DRISDOL) 1.25 MG (50000 UNIT) CAPS capsule; Take 1 capsule (50,000 Units total) by mouth every 7 (seven) days.  Dispense: 4 capsule; Refill: 0 - VITAMIN D 25 Hydroxy (Vit-D Deficiency, Fractures)  3. Other hyperlipidemia Discussed labs with patient today.  Cardiovascular risk and specific lipid/LDL goals reviewed.  We discussed several lifestyle modifications today and Tensley will continue to work on diet, exercise and weight loss efforts. Orders and follow up as documented in patient record.   Counseling Intensive lifestyle modifications are the first line treatment for this issue. . Dietary changes: Increase soluble fiber. Decrease simple carbohydrates. . Exercise changes: Moderate to vigorous-intensity aerobic activity 150 minutes per week if tolerated. . Lipid-lowering medications: see documented in medical record. - Insulin, random - Lipid Panel With LDL/HDL Ratio  4. Vitamin B12 deficiency Discussed labs with patient today.  The diagnosis was reviewed with the patient. Counseling provided today, see below. We will continue to monitor. Orders and follow up as documented in patient record.  Counseling . The body needs vitamin B12: to make red blood cells; to make DNA; and to help the nerves work properly so they can carry messages from the brain to the body.  . The main causes of vitamin B12 deficiency include dietary deficiency, digestive diseases, pernicious anemia, and having a surgery in which part of the stomach or small intestine is removed.  . Certain medicines can make it harder for the body to absorb vitamin B12. These medicines include: heartburn medications; some antibiotics; some medications used to treat diabetes, gout,  and high cholesterol.  . In some cases, there are no symptoms of this condition. If the condition leads to anemia or nerve damage, various symptoms can occur, such as weakness or fatigue, shortness of breath, and numbness or tingling in your hands and feet.   . Treatment:  o May include taking vitamin B12 supplements.  o Avoid alcohol.  o Eat lots of healthy foods that contain vitamin B12: - Beef, pork, chicken, [place], and organ meats, such as liver.  - Seafood: This includes clams, rainbow trout, salmon, tuna, and haddock. Eggs.  - Cereal and dairy products that are fortified: This means that vitamin B12 has been added to the food.  - Vitamin B12  5. Insulin resistance Discussed labs with patient today.  Jezelle will continue to work on weight loss, exercise, and decreasing simple carbohydrates to help decrease the risk of diabetes. Leslieann agreed to follow-up with [place] as directed to closely monitor her progress.  Will check labs today.  Continue prudent nutritional plan, weight loss.  6. At risk for constipation Allysha was given approximately 15 minutes of counseling today regarding prevention of constipation. She was encouraged to increase water and fiber  intake.   7. Class 2 severe obesity with serious comorbidity and body mass index (BMI) of 38.0 to 38.9 in adult, unspecified obesity type (HCC) Kieran is currently in the action stage of change. As such, her goal is to continue with weight loss efforts. She has agreed to the Category 2 Plan.   Exercise goals: As is.  Behavioral modification strategies: increasing water intake.  Dorissa has agreed to follow-up with our clinic in 2 weeks. She was informed of the importance of frequent follow-up visits to maximize her success with intensive lifestyle modifications for her multiple health conditions.   Harvey was informed we would discuss her lab results at her next visit unless there is a critical issue that needs to be  addressed sooner. Milyn agreed to keep her next visit at the agreed upon time to discuss these results.  Objective:   Blood pressure 118/80, pulse 77, temperature 98.3 F (36.8 C), height 5\' 3"  (1.6 m), weight 214 lb (97.1 kg), last menstrual period 02/11/2018, SpO2 100 %. Body mass index is 37.91 kg/m.  General: Cooperative, alert, well developed, in no acute distress. HEENT: Conjunctivae and lids unremarkable. Cardiovascular: Regular rhythm.  Lungs: Normal work of breathing. Neurologic: No focal deficits.   Lab Results  Component Value Date   CREATININE 0.78 03/19/2020   BUN 9 03/19/2020   NA 138 03/19/2020   K 4.0 03/19/2020   CL 100 03/19/2020   CO2 25 03/19/2020   Lab Results  Component Value Date   ALT 11 03/19/2020   AST 18 03/19/2020   ALKPHOS 107 03/19/2020   BILITOT 0.2 03/19/2020   Lab Results  Component Value Date   HGBA1C 5.4 03/19/2020   Lab Results  Component Value Date   INSULIN 8.7 06/19/2020   INSULIN 9.9 03/19/2020   Lab Results  Component Value Date   TSH 1.360 03/19/2020   Lab Results  Component Value Date   CHOL 178 06/19/2020   HDL 47 06/19/2020   LDLCALC 122 (H) 06/19/2020   TRIG 46 06/19/2020   Lab Results  Component Value Date   WBC 9.2 03/19/2020   HGB 13.1 03/19/2020   HCT 40.9 03/19/2020   MCV 85 03/19/2020   PLT 345 03/19/2020   Attestation Statements:   Reviewed by clinician on day of visit: allergies, medications, problem list, medical history, surgical history, family history, social history, and previous encounter notes.  I, Water quality scientist, CMA, am acting as Location manager for [employer], DO.  I have reviewed the above documentation for accuracy and completeness, and I agree with the above. Mellody Dance, DO

## 2020-06-20 LAB — VITAMIN D 25 HYDROXY (VIT D DEFICIENCY, FRACTURES): Vit D, 25-Hydroxy: 50.2 ng/mL (ref 30.0–100.0)

## 2020-06-20 LAB — LIPID PANEL WITH LDL/HDL RATIO
Cholesterol, Total: 178 mg/dL (ref 100–199)
HDL: 47 mg/dL (ref >39)
LDL Chol Calc (NIH): 122 mg/dL — ABNORMAL HIGH (ref 0–99)
LDL/HDL Ratio: 2.6 ratio (ref 0.0–3.2)
Triglycerides: 46 mg/dL (ref 0–149)
VLDL Cholesterol Cal: 9 mg/dL (ref 5–40)

## 2020-06-20 LAB — INSULIN, RANDOM: INSULIN: 8.7 u[IU]/mL (ref 2.6–24.9)

## 2020-06-20 LAB — VITAMIN B12: Vitamin B-12: 1587 pg/mL — ABNORMAL HIGH (ref 232–1245)

## 2020-07-03 ENCOUNTER — Other Ambulatory Visit: Payer: Self-pay

## 2020-07-03 ENCOUNTER — Ambulatory Visit (INDEPENDENT_AMBULATORY_CARE_PROVIDER_SITE_OTHER): Payer: 59 | Admitting: Family Medicine

## 2020-07-03 ENCOUNTER — Encounter (INDEPENDENT_AMBULATORY_CARE_PROVIDER_SITE_OTHER): Payer: Self-pay | Admitting: Family Medicine

## 2020-07-03 VITALS — BP 122/82 | HR 74 | Temp 98.5°F | Ht 63.0 in | Wt 211.0 lb

## 2020-07-03 DIAGNOSIS — E538 Deficiency of other specified B group vitamins: Secondary | ICD-10-CM | POA: Diagnosis not present

## 2020-07-03 DIAGNOSIS — E8881 Metabolic syndrome: Secondary | ICD-10-CM | POA: Diagnosis not present

## 2020-07-03 DIAGNOSIS — E559 Vitamin D deficiency, unspecified: Secondary | ICD-10-CM

## 2020-07-03 DIAGNOSIS — E7849 Other hyperlipidemia: Secondary | ICD-10-CM | POA: Diagnosis not present

## 2020-07-03 DIAGNOSIS — Z6837 Body mass index (BMI) 37.0-37.9, adult: Secondary | ICD-10-CM

## 2020-07-04 ENCOUNTER — Encounter (INDEPENDENT_AMBULATORY_CARE_PROVIDER_SITE_OTHER): Payer: Self-pay | Admitting: Family Medicine

## 2020-07-04 NOTE — Progress Notes (Signed)
Chief Complaint:   OBESITY Lauren Garza is here to discuss her progress with her obesity treatment plan along with follow-up of her obesity related diagnoses. Lauren Garza is on the Category 2 Plan and states she is following her eating plan approximately 95% of the time. Lauren Garza states she is exercising for 0 minutes 0 times per week.  Today's visit was #: 8 Starting weight: 237 lbs Starting date: 03/19/2020 Today's weight: 211 lbs Today's date: 07/03/2020 Total lbs lost to date: 26 lbs Total lbs lost since last in-office visit: 3 lbs  Interim History: Lauren Garza says she started drinking more water after our discussion at her last office visit.  She says she has had hunger 1-2 days over the past 2 weeks, but she feels it is boredom/emotionally motivated.  Cravings are well-controlled.  She says her mom is starting her tomorrow with our program and she is very excited about that.  She also recently had labs on 06/19/20 and she is here to go over these with me today.    Subjective:   1. Other hyperlipidemia Lauren Garza has hyperlipidemia and has been trying to improve her cholesterol levels with intensive lifestyle modification including a low saturated fat diet, exercise and weight loss. She denies any chest pain, claudication or myalgias.    Lab Results  Component Value Date   ALT 11 03/19/2020   AST 18 03/19/2020   ALKPHOS 107 03/19/2020   BILITOT 0.2 03/19/2020   Lab Results  Component Value Date   CHOL 178 06/19/2020   HDL 47 06/19/2020   LDLCALC 122 (H) 06/19/2020   TRIG 46 06/19/2020   2. Vitamin D deficiency Lauren Garza's Vitamin D level was 50.2 on 06/19/2020. She is currently taking prescription vitamin D 50,000 IU each week. She denies nausea, vomiting or muscle weakness.  3. Vitamin B12 deficiency She is not a vegetarian.  She does not have a previous diagnosis of pernicious anemia.  She does not have a history of weight loss surgery.  History of B12 injections.  Her  last injection was 2 months ago.  Lab Results  Component Value Date   VITAMINB12 1,587 (H) 06/19/2020   4. Insulin resistance Lauren Garza has a diagnosis of insulin resistance based on her elevated fasting insulin level >5. She continues to work on diet and exercise to decrease her risk of diabetes.  Insulin level has decreased a little from prior.  Lab Results  Component Value Date   INSULIN 8.7 06/19/2020   INSULIN 9.9 03/19/2020   Lab Results  Component Value Date   HGBA1C 5.4 03/19/2020   Assessment/Plan:   1. Other hyperlipidemia Discussed labs with patient today.  Cardiovascular risk and specific lipid/LDL goals reviewed.  We discussed several lifestyle modifications today and Lauren Garza will continue to work on diet, exercise and weight loss efforts. Orders and follow up as documented in patient record.  Continue prudent nutritional plan, weight loss, and low saturated/trans fats in diet.  Will continue to check labs every 4 months or so.  Counseling Intensive lifestyle modifications are the first line treatment for this issue.  Dietary changes: Increase soluble fiber. Decrease simple carbohydrates.  Exercise changes: Moderate to vigorous-intensity aerobic activity 150 minutes per week if tolerated.  Lipid-lowering medications: see documented in medical record.  2. Vitamin D deficiency Discussed labs with patient today.  Low Vitamin D level contributes to fatigue and are associated with obesity, breast, and colon cancer. She agrees to continue to take prescription Vitamin D @50 ,000 IU every week  and will follow-up for routine testing of Vitamin D, at least 2-3 times per year to avoid over-replacement.  Continue medications as level is improved from prior.  Recheck level in 3 months.  3. Vitamin B12 deficiency I told the patient she does not need additional B12.  Continue prudent nutritional plan and weight loss.  Follow-up with PCP as planned.  The diagnosis was reviewed with  the patient. Counseling provided today, see below. We will continue to monitor. Orders and follow up as documented in patient record.  Counseling  The body needs vitamin B12: to make red blood cells; to make DNA; and to help the nerves work properly so they can carry messages from the brain to the body.   The main causes of vitamin B12 deficiency include dietary deficiency, digestive diseases, pernicious anemia, and having a surgery in which part of the stomach or small intestine is removed.   Certain medicines can make it harder for the body to absorb vitamin B12. These medicines include: heartburn medications; some antibiotics; some medications used to treat diabetes, gout, and high cholesterol.   In some cases, there are no symptoms of this condition. If the condition leads to anemia or nerve damage, various symptoms can occur, such as weakness or fatigue, shortness of breath, and numbness or tingling in your hands and feet.    Treatment:  o May include taking vitamin B12 supplements.  o Avoid alcohol.  o Eat lots of healthy foods that contain vitamin B12: - Beef, pork, chicken, [place], and organ meats, such as liver.  - Seafood: This includes clams, rainbow trout, salmon, tuna, and haddock. Eggs.  - Cereal and dairy products that are fortified: This means that vitamin B12 has been added to the food.   4. Insulin resistance Discussed labs with patient today.  Lauren Garza will continue to work on weight loss, exercise, and decreasing simple carbohydrates to help decrease the risk of diabetes. Lauren Garza agreed to follow-up with [place] as directed to closely monitor her progress.  Counseling done regarding condition.  Continue prudent nutritional plan, weight loss.  5. Class 2 severe obesity with serious comorbidity and body mass index (BMI) of 37.0 to 37.9 in adult, unspecified obesity type (HCC) Lauren Garza is currently in the action stage of change. As such, her goal is to continue with weight  loss efforts. She has agreed to the Category 2 Plan.   Exercise goals: As is.  Behavioral modification strategies: increasing lean protein intake, increasing vegetables, increasing water intake, decreasing eating out and no skipping meals.  Lauren Garza has agreed to follow-up with our clinic in 2 weeks. She was informed of the importance of frequent follow-up visits to maximize her success with intensive lifestyle modifications for her multiple health conditions.   Objective:   Blood pressure 122/82, pulse 74, temperature 98.5 F (36.9 C), height 5\' 3"  (1.6 m), weight 211 lb (95.7 kg), last menstrual period 02/11/2018, SpO2 100 %. Body mass index is 37.38 kg/m.  General: Cooperative, alert, well developed, in no acute distress. HEENT: Conjunctivae and lids unremarkable. Cardiovascular: Regular rhythm.  Lungs: Normal work of breathing. Neurologic: No focal deficits.   Lab Results  Component Value Date   CREATININE 0.78 03/19/2020   BUN 9 03/19/2020   NA 138 03/19/2020   K 4.0 03/19/2020   CL 100 03/19/2020   CO2 25 03/19/2020   Lab Results  Component Value Date   ALT 11 03/19/2020   AST 18 03/19/2020   ALKPHOS 107 03/19/2020   BILITOT  0.2 03/19/2020   Lab Results  Component Value Date   HGBA1C 5.4 03/19/2020   Lab Results  Component Value Date   INSULIN 8.7 06/19/2020   INSULIN 9.9 03/19/2020   Lab Results  Component Value Date   TSH 1.360 03/19/2020   Lab Results  Component Value Date   CHOL 178 06/19/2020   HDL 47 06/19/2020   LDLCALC 122 (H) 06/19/2020   TRIG 46 06/19/2020   Lab Results  Component Value Date   WBC 9.2 03/19/2020   HGB 13.1 03/19/2020   HCT 40.9 03/19/2020   MCV 85 03/19/2020   PLT 345 03/19/2020   Attestation Statements:   Reviewed by clinician on day of visit: allergies, medications, problem list, medical history, surgical history, family history, social history, and previous encounter notes.  Time spent on visit including  pre-visit chart review and post-visit care and charting was 25 minutes.   I, Water quality scientist, CMA, am acting as Location manager for [employer], DO.  I have reviewed the above documentation for accuracy and completeness, and I agree with the above. Mellody Dance, DO

## 2020-07-17 ENCOUNTER — Ambulatory Visit (INDEPENDENT_AMBULATORY_CARE_PROVIDER_SITE_OTHER): Payer: 59 | Admitting: Adult Health

## 2020-07-17 ENCOUNTER — Other Ambulatory Visit: Payer: Self-pay

## 2020-07-17 ENCOUNTER — Encounter (INDEPENDENT_AMBULATORY_CARE_PROVIDER_SITE_OTHER): Payer: Self-pay | Admitting: Adult Health

## 2020-07-17 ENCOUNTER — Encounter (INDEPENDENT_AMBULATORY_CARE_PROVIDER_SITE_OTHER): Payer: Self-pay

## 2020-07-17 VITALS — BP 111/78 | HR 91 | Temp 98.1°F | Ht 63.0 in | Wt 210.0 lb

## 2020-07-17 DIAGNOSIS — Z0000 Encounter for general adult medical examination without abnormal findings: Secondary | ICD-10-CM

## 2020-07-18 NOTE — Patient Instructions (Signed)
OV was cancelled due to pt exhibiting COVID- type sx's.

## 2020-07-30 ENCOUNTER — Encounter (INDEPENDENT_AMBULATORY_CARE_PROVIDER_SITE_OTHER): Payer: Self-pay | Admitting: Adult Health

## 2020-07-30 ENCOUNTER — Ambulatory Visit (INDEPENDENT_AMBULATORY_CARE_PROVIDER_SITE_OTHER): Payer: 59 | Admitting: Adult Health

## 2020-07-30 ENCOUNTER — Other Ambulatory Visit: Payer: Self-pay

## 2020-07-30 VITALS — BP 105/68 | HR 71 | Temp 98.5°F | Ht 63.0 in | Wt 210.0 lb

## 2020-07-30 DIAGNOSIS — Z9189 Other specified personal risk factors, not elsewhere classified: Secondary | ICD-10-CM | POA: Diagnosis not present

## 2020-07-30 DIAGNOSIS — Z6837 Body mass index (BMI) 37.0-37.9, adult: Secondary | ICD-10-CM

## 2020-07-30 DIAGNOSIS — E8881 Metabolic syndrome: Secondary | ICD-10-CM

## 2020-07-30 DIAGNOSIS — E559 Vitamin D deficiency, unspecified: Secondary | ICD-10-CM | POA: Diagnosis not present

## 2020-07-30 MED ORDER — VITAMIN D (ERGOCALCIFEROL) 1.25 MG (50000 UNIT) PO CAPS: 50000.0000 [IU] | ORAL_CAPSULE | ORAL | 0 refills | Status: DC

## 2020-07-30 NOTE — Progress Notes (Signed)
Chief Complaint:   OBESITY Lauren Garza is here to discuss her progress with her obesity treatment plan along with follow-up of her obesity related diagnoses. Lauren Garza is on the Category 2 Plan and states she is following her eating plan approximately 95% of the time. Lauren Garza states she is exercising 0 minutes 0 times per week.  Today's visit was #: 9 Starting weight: 237 lbs Starting date: 03/19/2020 Today's weight: 210 lbs Today's date: 07/30/2020 Total lbs lost to date: 27 Total lbs lost since last in-office visit: 1  Interim History: Lauren Garza continues to enjoy the foods and structure of the Category 2 meal plan. She stayed on track during a recent cookout by focusing on lean protein and vegetables. Her ultimate goal is to lose down to less than 200 lbs.  Subjective:   Vitamin D deficiency. Lauren Garza is on Ergocalciferol. No nausea, vomiting, or muscle weakness.    Ref. Range 06/19/2020 13:26  Vitamin D, 25-Hydroxy Latest Ref Range: 30.0 - 100.0 ng/mL 50.2   Insulin resistance. Lauren Garza has a diagnosis of insulin resistance based on her elevated fasting insulin level >5. She continues to work on diet and exercise to decrease her risk of diabetes. Insulin level remains above goal at the last two checks. Blood glucose and A1c were within normal limits. Lauren Garza is not on metformin and denies polyphagia when she eats on plan.  Lab Results  Component Value Date   INSULIN 8.7 06/19/2020   INSULIN 9.9 03/19/2020   Lab Results  Component Value Date   HGBA1C 5.4 03/19/2020   At risk for diabetes mellitus. Lauren Garza is at higher than average risk for developing diabetes due to insulin resistance and obesity.   Assessment/Plan:   Vitamin D deficiency. Low Vitamin D level contributes to fatigue and are associated with obesity, breast, and colon cancer. She was given a refill on her Vitamin D, Ergocalciferol, (DRISDOL) 1.25 MG (50000 UNIT) CAPS capsule every week #4  with 0 refills and will follow-up for routine testing of Vitamin D every 3 months.   Insulin resistance. Lauren Garza will continue to work on weight loss, exercise, and decreasing simple carbohydrates to help decrease the risk of diabetes. Lauren Garza agreed to follow-up with [place] as directed to closely monitor her progress. She will continue to follow the Category 2 meal plan and will have labs checked every 3 months.   At risk for diabetes mellitus. Lauren Garza was given approximately 15 minutes of diabetes education and counseling today. We discussed intensive lifestyle modifications today with an emphasis on weight loss as well as increasing exercise and decreasing simple carbohydrates in her diet. We also reviewed medication options with an emphasis on risk versus benefit of those discussed.   Repetitive spaced learning was employed today to elicit superior memory formation and behavioral change.  Class 2 severe obesity with serious comorbidity and body mass index (BMI) of 37.0 to 37.9 in adult, unspecified obesity type (Lauren Garza).  Lauren Garza is currently in the action stage of change. As such, her goal is to continue with weight loss efforts. She has agreed to the Category 2 Plan.   Exercise goals: No exercise has been prescribed at this time.  Behavioral modification strategies: increasing lean protein intake, meal planning and cooking strategies and planning for success.  Lauren Garza has agreed to follow-up with our clinic in 2 weeks. She was informed of the importance of frequent follow-up visits to maximize her success with intensive lifestyle modifications for her multiple health conditions.   Objective:  Blood pressure 105/68, pulse 71, temperature 98.5 F (36.9 C), temperature source Oral, height 5\' 3"  (1.6 m), weight 210 lb (95.3 kg), last menstrual period 02/11/2018, SpO2 100 %. Body mass index is 37.2 kg/m.  General: Cooperative, alert, well developed, in no acute distress. HEENT:  Conjunctivae and lids unremarkable. Cardiovascular: Regular rhythm.  Lungs: Normal work of breathing. Neurologic: No focal deficits.   Lab Results  Component Value Date   CREATININE 0.78 03/19/2020   BUN 9 03/19/2020   NA 138 03/19/2020   K 4.0 03/19/2020   CL 100 03/19/2020   CO2 25 03/19/2020   Lab Results  Component Value Date   ALT 11 03/19/2020   AST 18 03/19/2020   ALKPHOS 107 03/19/2020   BILITOT 0.2 03/19/2020   Lab Results  Component Value Date   HGBA1C 5.4 03/19/2020   Lab Results  Component Value Date   INSULIN 8.7 06/19/2020   INSULIN 9.9 03/19/2020   Lab Results  Component Value Date   TSH 1.360 03/19/2020   Lab Results  Component Value Date   CHOL 178 06/19/2020   HDL 47 06/19/2020   LDLCALC 122 (H) 06/19/2020   TRIG 46 06/19/2020   Lab Results  Component Value Date   WBC 9.2 03/19/2020   HGB 13.1 03/19/2020   HCT 40.9 03/19/2020   MCV 85 03/19/2020   PLT 345 03/19/2020   No results found for: IRON, TIBC, FERRITIN  Attestation Statements:   Reviewed by clinician on day of visit: allergies, medications, problem list, medical history, surgical history, family history, social history, and previous encounter notes.  I, Michaelene Song, am acting as Location manager for [employer], NP-C   I have reviewed the above documentation for accuracy and completeness, and I agree with the above. -  Esaw Grandchild, NP

## 2020-08-14 ENCOUNTER — Other Ambulatory Visit: Payer: Self-pay

## 2020-08-14 ENCOUNTER — Encounter (INDEPENDENT_AMBULATORY_CARE_PROVIDER_SITE_OTHER): Payer: Self-pay | Admitting: Adult Health

## 2020-08-14 ENCOUNTER — Ambulatory Visit (INDEPENDENT_AMBULATORY_CARE_PROVIDER_SITE_OTHER): Payer: 59 | Admitting: Adult Health

## 2020-08-14 VITALS — BP 103/71 | HR 73 | Temp 98.5°F | Ht 63.0 in | Wt 209.0 lb

## 2020-08-14 DIAGNOSIS — E7849 Other hyperlipidemia: Secondary | ICD-10-CM

## 2020-08-14 DIAGNOSIS — E559 Vitamin D deficiency, unspecified: Secondary | ICD-10-CM

## 2020-08-14 DIAGNOSIS — Z6837 Body mass index (BMI) 37.0-37.9, adult: Secondary | ICD-10-CM

## 2020-08-14 DIAGNOSIS — Z9189 Other specified personal risk factors, not elsewhere classified: Secondary | ICD-10-CM

## 2020-08-14 DIAGNOSIS — E538 Deficiency of other specified B group vitamins: Secondary | ICD-10-CM

## 2020-08-15 ENCOUNTER — Encounter (INDEPENDENT_AMBULATORY_CARE_PROVIDER_SITE_OTHER): Payer: Self-pay | Admitting: Adult Health

## 2020-08-15 ENCOUNTER — Other Ambulatory Visit (INDEPENDENT_AMBULATORY_CARE_PROVIDER_SITE_OTHER): Payer: Self-pay | Admitting: Adult Health

## 2020-08-15 LAB — VITAMIN B12: Vitamin B-12: 1200 pg/mL (ref 232–1245)

## 2020-08-15 LAB — VITAMIN D 25 HYDROXY (VIT D DEFICIENCY, FRACTURES): Vit D, 25-Hydroxy: 55 ng/mL (ref 30.0–100.0)

## 2020-08-15 NOTE — Telephone Encounter (Signed)
Pt reply .

## 2020-08-18 NOTE — Progress Notes (Signed)
Chief Complaint:   OBESITY Lauren Garza is here to discuss her progress with her obesity treatment plan along with follow-up of her obesity related diagnoses. Lauren Garza is on the Category 2 Plan and states she is following her eating plan approximately 95% of the time. Lauren Garza states she is not exercising at this time.  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  She also celebrated her son's 16th birthday at a cookout and consumed a regular beef hotdog and a very small portion of homemade mac-n-cheese.  Subjective:   1. Vitamin D deficiency [name] Vitamin D level was 50.2 on 06/19/2020. She is currently taking prescription vitamin D 50,000 IU each week. She denies nausea, vomiting or muscle weakness.  On 06/19/2020, vitamin D level was at goal of above 50.  2. Vitamin B12 deficiency At last check, vitamin B12 was over-replaced.  She has paused her B12 injections with her PCP.  Lab Results  Component Value Date   VITAMINB12 1,200 08/14/2020   3. Other hyperlipidemia, pure Lauren Garza has hyperlipidemia and has been trying to improve her cholesterol levels with intensive lifestyle modification including a low saturated fat diet, exercise and weight loss. She denies any chest pain, claudication or myalgias.  She denies tobacco/vape use.  On 06/19/2020, lipid panel showed LDL 122, slightly elevated from last.  She is not on statin therapy.  Lab Results  Component Value Date   ALT 11 03/19/2020   AST 18 03/19/2020   ALKPHOS 107 03/19/2020   BILITOT 0.2 03/19/2020   Lab Results  Component Value Date   CHOL 178 06/19/2020   HDL 47 06/19/2020   LDLCALC 122 (H) 06/19/2020   TRIG 46 06/19/2020   4. At  risk for heart disease Lauren Garza is at a higher than average risk for cardiovascular disease due to obesity and hyperlipidemia.   Assessment/Plan:   1. Vitamin D deficiency Low Vitamin D level contributes to fatigue and are associated with obesity, breast, and colon cancer.  Will check vitamin D level today.  If appropriate, will refill ergocalciferol.   - VITAMIN D 25 Hydroxy (Vit-D Deficiency, Fractures)  2. Vitamin B12 deficiency The diagnosis was reviewed with the patient. Counseling provided today, see below. We will continue to monitor. Orders and follow up as documented in patient record.  Check labs today.  Counseling  The body needs vitamin B12: to make red blood cells; to make DNA; and to help the nerves work properly so they can carry messages from the brain to the body.   The main causes of vitamin B12 deficiency include dietary deficiency, digestive diseases, pernicious anemia, and having a surgery in which part of the stomach or small intestine is removed.   Certain medicines can make it harder for the body to absorb vitamin B12. These medicines include: heartburn medications; some antibiotics; some medications used to treat diabetes, gout, and high cholesterol.   In some cases, there are no symptoms of this condition. If the condition leads to anemia or nerve damage, various symptoms can occur, such as weakness or fatigue, shortness of breath, and numbness or tingling in your hands and feet.    Treatment:  o May include taking vitamin B12 supplements.  o Avoid alcohol.  o  Eat lots of healthy foods that contain vitamin B12: - Beef, pork, chicken, [place], and organ meats, such as liver.  - Seafood: This includes clams, rainbow trout, salmon, tuna, and haddock. Eggs.  - Cereal and dairy products that are fortified: This means that vitamin B12 has been added to the food.  -  - Vitamin B12  3. Other hyperlipidemia, pure Cardiovascular risk and specific lipid/LDL goals  reviewed.  We discussed several lifestyle modifications today and Lauren Garza will continue to work on diet, exercise and weight loss efforts. Orders and follow up as documented in patient record.   Continue to reduce saturated fat intake.  Be as active as possible.  Check labs every 3 months.  Counseling Intensive lifestyle modifications are the first line treatment for this issue.  Dietary changes: Increase soluble fiber. Decrease simple carbohydrates.  Exercise changes: Moderate to vigorous-intensity aerobic activity 150 minutes per week if tolerated.  Lipid-lowering medications: see documented in medical record.  4. At risk for heart disease Lauren Garza was given approximately 15 minutes of coronary artery disease prevention counseling today. She is 39 y.o. female and has risk factors for heart disease including obesity. We discussed intensive lifestyle modifications today with an emphasis on specific weight loss instructions and strategies.   Repetitive spaced learning was employed today to elicit superior memory formation and behavioral change.  5. Class 2 severe obesity with serious comorbidity and body mass index (BMI) of 37.0 to 37.9 in adult, unspecified obesity type (HCC) Lauren Garza is currently in the action stage of change. As such, her goal is to continue with weight loss efforts. She has agreed to the Category 2 Plan.   Exercise goals: No exercise has been prescribed at this time.  Behavioral modification strategies: increasing lean protein intake, increasing water intake, meal planning and cooking strategies, celebration eating strategies and planning for success.  Lauren Garza has agreed to follow-up with our clinic in 2 weeks. She was informed of the importance of frequent follow-up visits to maximize her success with intensive lifestyle modifications for her multiple health conditions.   Lauren Garza was informed we would discuss her lab results at her next visit unless there is a  critical issue that needs to be addressed sooner. Lauren Garza agreed to keep her next visit at the agreed upon time to discuss these results.  Objective:   Blood pressure 103/71, pulse 73, temperature 98.5 F (36.9 C), temperature source Oral, height _0  (1.6 m), weight 209 lb (94.8 kg), last menstrual period 02/11/2018. Body mass index is 37.02 kg/m.  General: Cooperative, alert, well developed, in no acute distress. HEENT: Conjunctivae and lids unremarkable. Cardiovascular: Regular rhythm.  Lungs: Normal work of breathing. Neurologic: No focal deficits.   Lab Results  Component Value Date   CREATININE 0.78 03/19/2020   BUN 9 03/19/2020   NA 138 03/19/2020   K 4.0 03/19/2020   CL 100 03/19/2020   CO2 25 03/19/2020   Lab Results  Component Value Date   ALT 11 03/19/2020   AST 18 03/19/2020   ALKPHOS 107 03/19/2020   BILITOT 0.2 03/19/2020   Lab Results  Component Value Date   HGBA1C 5.4 03/19/2020   Lab Results  Component Value Date   INSULIN 8.7 06/19/2020   INSULIN 9.9 03/19/2020   Lab Results  Component Value Date   TSH 1.360 03/19/2020   Lab Results  Component Value Date   CHOL 178 06/19/2020   HDL 47 06/19/2020   LDLCALC 122 (H) 06/19/2020   TRIG 46  06/19/2020   Lab Results  Component Value Date   WBC 9.2 03/19/2020   HGB 13.1 03/19/2020   HCT 40.9 03/19/2020   MCV 85 03/19/2020   PLT 345 03/19/2020   Attestation Statements:   Reviewed by clinician on day of visit: allergies, medications, problem list, medical history, surgical history, family history, social history, and previous encounter notes.  I, Water quality scientist, CMA, am acting as Location manager for Mina Marble, NP.  I have reviewed the above documentation for accuracy and completeness, and I agree with the above. -  Esaw Grandchild, NP

## 2020-08-19 DIAGNOSIS — E7849 Other hyperlipidemia: Secondary | ICD-10-CM | POA: Insufficient documentation

## 2020-08-19 DIAGNOSIS — E538 Deficiency of other specified B group vitamins: Secondary | ICD-10-CM | POA: Insufficient documentation

## 2020-08-26 ENCOUNTER — Telehealth: Payer: Self-pay

## 2020-08-26 NOTE — Telephone Encounter (Signed)
Patient had bilateral greater trochanteric bursa injections on 02/16/20. Please advise.

## 2020-08-26 NOTE — Telephone Encounter (Signed)
Patient called she wants to schedule an appointment with Dr.Newton. call [phone]

## 2020-08-26 NOTE — Telephone Encounter (Signed)
If they helped then ok. She has had MRI lspine since I saw her with only mild findings. Spine not the source of any numbness or tingling etc.

## 2020-08-27 NOTE — Telephone Encounter (Signed)
Patient states last injections really helped. Scheduled for repeat.

## 2020-08-28 ENCOUNTER — Other Ambulatory Visit: Payer: Self-pay

## 2020-08-28 ENCOUNTER — Ambulatory Visit (INDEPENDENT_AMBULATORY_CARE_PROVIDER_SITE_OTHER): Payer: 59 | Admitting: Adult Health

## 2020-08-28 ENCOUNTER — Encounter (INDEPENDENT_AMBULATORY_CARE_PROVIDER_SITE_OTHER): Payer: Self-pay | Admitting: Adult Health

## 2020-08-28 VITALS — BP 107/74 | HR 62 | Temp 98.0°F | Ht 63.0 in | Wt 207.0 lb

## 2020-08-28 DIAGNOSIS — E559 Vitamin D deficiency, unspecified: Secondary | ICD-10-CM | POA: Diagnosis not present

## 2020-08-28 DIAGNOSIS — J4520 Mild intermittent asthma, uncomplicated: Secondary | ICD-10-CM

## 2020-08-28 DIAGNOSIS — Z6836 Body mass index (BMI) 36.0-36.9, adult: Secondary | ICD-10-CM

## 2020-08-28 DIAGNOSIS — E538 Deficiency of other specified B group vitamins: Secondary | ICD-10-CM | POA: Diagnosis not present

## 2020-08-29 NOTE — Progress Notes (Signed)
Chief Complaint:   OBESITY Lauren Garza is here to discuss her progress with her obesity treatment plan along with follow-up of her obesity related diagnoses. Lauren Garza is on the Category 2 Plan and states she is following her eating plan approximately 95% of the time. Lauren Garza states she is exercising for 0 minutes 0 times per week.  Today's visit was #: 11 Starting weight: 237 lbs Starting date: 03/19/2020 Today's weight: 207 lbs Today's date: 08/28/2020 Total lbs lost to date: 30 lbs Total lbs lost since last in-office visit: 2 lbs  Interim History: Lauren Garza has lost another 2 pounds for a total of 30 pounds!  She has lowered her BMI to 36, down from 41 when establishing at the practice.  She continues to enjoy foods and structure of Category 2 meal plan.  Denies feelings of being deprived.  Subjective:   1. Vitamin D deficiency Lauren Garza's Vitamin D level was 55.0 on 08/14/2020. She is currently taking OTC vitamin D 5,000 Iu each day. She denies nausea, vomiting or muscle weakness. Vitamin D level is controlled.   2. Vitamin B12 deficiency She is not a vegetarian.  She does not have a previous diagnosis of pernicious anemia.  She does not have a history of weight loss surgery.    Lab Results  Component Value Date   VITAMINB12 1,200 08/14/2020   3. Mild intermittent asthma, unspecified whether complicated She will use Symbicort 2 puffs in the morning.  She has not been using it in the evening.  She very rarely uses her albuterol as needed.  Assessment/Plan:   1. Vitamin D deficiency Low Vitamin D level contributes to fatigue and are associated with obesity, breast, and colon cancer. She agrees to continue to take OTC Vitamin D @5 ,000 IU daily and will follow-up for routine testing of Vitamin D, at least 2-3 times per year to avoid over-replacement.  2. Vitamin B12 deficiency The diagnosis was reviewed with the patient. Counseling provided today, see below. We will continue  to monitor. Orders and follow up as documented in patient record.  Resume B12 injections as directed by PCP.  Counseling . The body needs vitamin B12: to make red blood cells; to make DNA; and to help the nerves work properly so they can carry messages from the brain to the body.  . The main causes of vitamin B12 deficiency include dietary deficiency, digestive diseases, pernicious anemia, and having a surgery in which part of the stomach or small intestine is removed.  . Certain medicines can make it harder for the body to absorb vitamin B12. These medicines include: heartburn medications; some antibiotics; some medications used to treat diabetes, gout, and high cholesterol.  . In some cases, there are no symptoms of this condition. If the condition leads to anemia or nerve damage, various symptoms can occur, such as weakness or fatigue, shortness of breath, and numbness or tingling in your hands and feet.   . Treatment:  o May include taking vitamin B12 supplements.  o Avoid alcohol.  o Eat lots of healthy foods that contain vitamin B12: - Beef, pork, chicken, [place], and organ meats, such as liver.  - Seafood: This includes clams, rainbow trout, salmon, tuna, and haddock. Eggs.  - Cereal and dairy products that are fortified: This means that vitamin B12 has been added to the food.   3. Mild intermittent asthma, unspecified whether complicated Continue inhaler as directed.  Increase activity as tolerated.  4. Class 2 severe obesity with serious comorbidity  and body mass index (BMI) of 36.0 to 36.9 in adult, unspecified obesity type (HCC)  Lauren Garza is currently in the action stage of change. As such, her goal is to continue with weight loss efforts. She has agreed to the Category 2 Plan.   Exercise goals: No exercise has been prescribed at this time.  Behavioral modification strategies: increasing lean protein intake, meal planning and cooking strategies and planning for  success.  Lauren Garza has agreed to follow-up with our clinic in 2 weeks. She was informed of the importance of frequent follow-up visits to maximize her success with intensive lifestyle modifications for her multiple health conditions.   Objective:   Blood pressure 107/74, pulse 62, temperature 98 F (36.7 C), height 5\' 3"  (1.6 m), weight 207 lb (93.9 kg), last menstrual period 02/11/2018, SpO2 96 %. Body mass index is 36.67 kg/m.  General: Cooperative, alert, well developed, in no acute distress. HEENT: Conjunctivae and lids unremarkable. Cardiovascular: Regular rhythm.  Lungs: Normal work of breathing. Neurologic: No focal deficits.   Lab Results  Component Value Date   CREATININE 0.78 03/19/2020   BUN 9 03/19/2020   NA 138 03/19/2020   K 4.0 03/19/2020   CL 100 03/19/2020   CO2 25 03/19/2020   Lab Results  Component Value Date   ALT 11 03/19/2020   AST 18 03/19/2020   ALKPHOS 107 03/19/2020   BILITOT 0.2 03/19/2020   Lab Results  Component Value Date   HGBA1C 5.4 03/19/2020   Lab Results  Component Value Date   INSULIN 8.7 06/19/2020   INSULIN 9.9 03/19/2020   Lab Results  Component Value Date   TSH 1.360 03/19/2020   Lab Results  Component Value Date   CHOL 178 06/19/2020   HDL 47 06/19/2020   LDLCALC 122 (H) 06/19/2020   TRIG 46 06/19/2020   Lab Results  Component Value Date   WBC 9.2 03/19/2020   HGB 13.1 03/19/2020   HCT 40.9 03/19/2020   MCV 85 03/19/2020   PLT 345 03/19/2020   Attestation Statements:   Reviewed by clinician on day of visit: allergies, medications, problem list, medical history, surgical history, family history, social history, and previous encounter notes.  Time spent on visit including pre-visit chart review and post-visit care and charting was 29 minutes.   I, Water quality scientist, CMA, am acting as Location manager for Mina Marble, NP.  I have reviewed the above documentation for accuracy and completeness, and I agree with the  above. -  Esaw Grandchild, NP

## 2020-09-02 ENCOUNTER — Ambulatory Visit: Payer: Self-pay

## 2020-09-02 ENCOUNTER — Other Ambulatory Visit: Payer: Self-pay

## 2020-09-02 ENCOUNTER — Encounter: Payer: Self-pay | Admitting: Physical Medicine and Rehabilitation

## 2020-09-02 ENCOUNTER — Ambulatory Visit (INDEPENDENT_AMBULATORY_CARE_PROVIDER_SITE_OTHER): Payer: 59 | Admitting: Physical Medicine and Rehabilitation

## 2020-09-02 DIAGNOSIS — M7061 Trochanteric bursitis, right hip: Secondary | ICD-10-CM | POA: Diagnosis not present

## 2020-09-02 DIAGNOSIS — M7062 Trochanteric bursitis, left hip: Secondary | ICD-10-CM

## 2020-09-02 NOTE — Progress Notes (Signed)
Pt state both hip pain. Pt state walking and standing makes the pain worse. Pt state ice pack and heating pads helps ease the pain. Pt state the inj she had in March was helpful till a few weeks ago. Pt state sitting bring a lot of pressure to her hips.   Numeric Pain Rating Scale and Functional Assessment Average Pain 8   In the last MONTH (on 0-10 scale) has pain interfered with the following?  1. General activity like being  able to carry out your everyday physical activities such as walking, climbing stairs, carrying groceries, or moving a chair?  Rating(10)

## 2020-09-12 ENCOUNTER — Other Ambulatory Visit: Payer: Self-pay

## 2020-09-12 ENCOUNTER — Encounter (INDEPENDENT_AMBULATORY_CARE_PROVIDER_SITE_OTHER): Payer: Self-pay | Admitting: Adult Health

## 2020-09-12 ENCOUNTER — Ambulatory Visit (INDEPENDENT_AMBULATORY_CARE_PROVIDER_SITE_OTHER): Payer: 59 | Admitting: Adult Health

## 2020-09-12 VITALS — BP 120/82 | HR 79 | Temp 99.0°F | Ht 63.0 in | Wt 207.0 lb

## 2020-09-12 DIAGNOSIS — E8881 Metabolic syndrome: Secondary | ICD-10-CM | POA: Diagnosis not present

## 2020-09-12 DIAGNOSIS — J4520 Mild intermittent asthma, uncomplicated: Secondary | ICD-10-CM

## 2020-09-12 DIAGNOSIS — E559 Vitamin D deficiency, unspecified: Secondary | ICD-10-CM

## 2020-09-12 DIAGNOSIS — Z6836 Body mass index (BMI) 36.0-36.9, adult: Secondary | ICD-10-CM

## 2020-09-16 NOTE — Progress Notes (Signed)
Chief Complaint:   OBESITY Lauren Garza is here to discuss her progress with her obesity treatment plan along with follow-up of her obesity related diagnoses. Lauren Garza is on the Category 2 Plan and states she is following her eating plan approximately 93-95% of the time. Lauren Garza states she is exercising 0 minutes 0 times per week.  Today's visit was #: 12 Starting weight: 237 lbs Starting date: 03/19/2020 Today's weight: 207 lbs Today's date: 09/12/2020 Total lbs lost to date: 30 Total lbs lost since last in-office visit: 0  Interim History: Lauren Garza reports occasional late afternoon polyphagia and will often choose to snack on Special K cereal or a Kind bar. She still enjoys the foods and structure of the Category 2 Meal plan and isn't bored with the meals.  Subjective:   Insulin resistance. Lauren Garza has a diagnosis of insulin resistance based on her elevated fasting insulin level >5. She continues to work on diet and exercise to decrease her risk of diabetes. 06/19/2020 insulin level 8.7, decreased from 9.9 on 03/19/2020. Lauren Garza is not on metformin. She has been experiencing intermittent late afternoon polyphagia.  Lab Results  Component Value Date   INSULIN 8.7 06/19/2020   INSULIN 9.9 03/19/2020   Lab Results  Component Value Date   HGBA1C 5.4 03/19/2020   Vitamin D deficiency. Vitamin D level on 08/14/2020 was 55.0, which is at goal. She has converted to OTC supplementation.   Ref. Range 08/14/2020 15:19  Vitamin D, 25-Hydroxy Latest Ref Range: 30.0 - 100.0 ng/mL 55.0   Mild intermittent asthma, unspecified whether complicated. Lauren Garza states when she uses Symbicort daily her asthma symptoms are well controlled. When she skips several days of therapy, wheezing and shortness of breath will develop.  Assessment/Plan:   Insulin resistance. Lauren Garza will continue to work on weight loss, exercise, and decreasing simple carbohydrates to help decrease the  risk of diabetes. Lauren Garza agreed to follow-up with [place] as directed to closely monitor her progress. She will increase her protein intake at meals.  Vitamin D deficiency. Low Vitamin D level contributes to fatigue and are associated with obesity, breast, and colon cancer. She agrees to continue to take OTC  Vitamin D supplementation and will follow-up for routine testing of Vitamin D, at least 2-3 times per year to avoid over-replacement.  Mild intermittent asthma, unspecified whether complicated. Lauren Garza will use Symbicort as directed. She will continue to abstain from tobacco/vape use.  Class 2 severe obesity with serious comorbidity and body mass index (BMI) of 36.0 to 36.9 in adult, unspecified obesity type (Lauren Garza).  Lauren Garza is currently in the action stage of change. As such, her goal is to continue with weight loss efforts. She has agreed to the Category 2 Plan.   Lauren Garza will be performed at her next office visit.  Exercise goals: No exercise has been prescribed at this time.  Behavioral modification strategies: increasing lean protein intake, decreasing simple carbohydrates, increasing water intake, meal planning and cooking strategies and planning for success.  Lauren Garza has agreed to follow-up with our clinic fasting in 2 weeks. She was informed of the importance of frequent follow-up visits to maximize her success with intensive lifestyle modifications for her multiple health conditions.   Objective:   Blood pressure 120/82, pulse 79, temperature 99 F (37.2 C), height 5\' 3"  (1.6 m), weight 207 lb (93.9 kg), last menstrual period 02/11/2018, SpO2 99 %. Body mass index is 36.67 kg/m.  General: Cooperative, alert, well developed, in no acute distress. HEENT: Conjunctivae  and lids unremarkable. Cardiovascular: Regular rhythm.  Lungs: Normal work of breathing. Neurologic: No focal deficits.   Lab Results  Component Value Date   CREATININE 0.78 03/19/2020   BUN 9 03/19/2020   NA  138 03/19/2020   K 4.0 03/19/2020   CL 100 03/19/2020   CO2 25 03/19/2020   Lab Results  Component Value Date   ALT 11 03/19/2020   AST 18 03/19/2020   ALKPHOS 107 03/19/2020   BILITOT 0.2 03/19/2020   Lab Results  Component Value Date   HGBA1C 5.4 03/19/2020   Lab Results  Component Value Date   INSULIN 8.7 06/19/2020   INSULIN 9.9 03/19/2020   Lab Results  Component Value Date   TSH 1.360 03/19/2020   Lab Results  Component Value Date   CHOL 178 06/19/2020   HDL 47 06/19/2020   LDLCALC 122 (H) 06/19/2020   TRIG 46 06/19/2020   Lab Results  Component Value Date   WBC 9.2 03/19/2020   HGB 13.1 03/19/2020   HCT 40.9 03/19/2020   MCV 85 03/19/2020   PLT 345 03/19/2020   No results found for: IRON, TIBC, FERRITIN  Attestation Statements:   Reviewed by clinician on day of visit: allergies, medications, problem list, medical history, surgical history, family history, social history, and previous encounter notes.  Time spent on visit including pre-visit chart review and post-visit charting and care was 29 minutes.   I, Michaelene Song, am acting as Location manager for [employer], NP-C   I have reviewed the above documentation for accuracy and completeness, and I agree with the above. -  Brandom Kerwin d. Eitan Doubleday, NP-C

## 2020-09-26 ENCOUNTER — Other Ambulatory Visit: Payer: Self-pay

## 2020-09-26 ENCOUNTER — Ambulatory Visit (INDEPENDENT_AMBULATORY_CARE_PROVIDER_SITE_OTHER): Payer: 59 | Admitting: Adult Health

## 2020-09-26 VITALS — BP 114/82 | HR 74 | Temp 98.5°F | Ht 63.0 in | Wt 210.0 lb

## 2020-09-26 DIAGNOSIS — Z9189 Other specified personal risk factors, not elsewhere classified: Secondary | ICD-10-CM

## 2020-09-26 DIAGNOSIS — E8881 Metabolic syndrome: Secondary | ICD-10-CM | POA: Diagnosis not present

## 2020-09-26 DIAGNOSIS — E66812 Obesity, class 2: Secondary | ICD-10-CM

## 2020-09-26 DIAGNOSIS — R0602 Shortness of breath: Secondary | ICD-10-CM | POA: Diagnosis not present

## 2020-09-26 DIAGNOSIS — E7849 Other hyperlipidemia: Secondary | ICD-10-CM | POA: Diagnosis not present

## 2020-09-26 DIAGNOSIS — E559 Vitamin D deficiency, unspecified: Secondary | ICD-10-CM | POA: Diagnosis not present

## 2020-09-26 DIAGNOSIS — E88819 Insulin resistance, unspecified: Secondary | ICD-10-CM

## 2020-09-26 DIAGNOSIS — Z6837 Body mass index (BMI) 37.0-37.9, adult: Secondary | ICD-10-CM

## 2020-09-26 NOTE — Progress Notes (Signed)
Chief Complaint:   OBESITY Lauren Garza is here to discuss her progress with her obesity treatment plan along with follow-up of her obesity related diagnoses. [name] is on the Category 2 Plan and states she is following her eating plan approximately 93-95% of the time. [name] states she is walking 6,000 steps daily 7 times per week.  Today's visit was #: 53 Starting weight: 237 lbs Starting date: 03/19/2020 Today's weight: 210 lbs Today's date: 09/26/2020 Total lbs lost to date: 27 Total lbs lost since last in-office visit: 0  Interim History: [name] feels that her hunger levels have increased over the last 2-3 weeks. She has not been tracking snack calories and estimates to be going over by 200-400 calories a day.  Subjective:   Other hyperlipidemia. [name] has hyperlipidemia and has been trying to improve her cholesterol levels with intensive lifestyle modification including a low saturated fat diet, exercise and weight loss. She denies any chest pain, claudication or myalgias. 06/19/2020 LDL was above goal at 122. [name] denies cardiac symptoms. She is not on statin therapy.  Lab Results  Component Value Date   ALT 11 03/19/2020   AST 18 03/19/2020   ALKPHOS 107 03/19/2020   BILITOT 0.2 03/19/2020   Lab Results  Component Value Date   CHOL 178 06/19/2020   HDL 47 06/19/2020   LDLCALC 122 (H) 06/19/2020   TRIG 46 06/19/2020   SOB (shortness of breath) on exertion. [name] reports shortness of breath with extreme exertion. She has history of asthma, which is well controlled with daily Symbicort. She rarely uses PRN Albuterol. RMR >300 since initial RMR in April 2021.  Insulin resistance. [name] has a diagnosis of insulin resistance based on her elevated fasting insulin level >5. She continues to work on diet and exercise to decrease her risk of diabetes. 03/19/2020 blood glucose 75, A1c 5.4 with an insulin level of 9.9. [name] is not on  metformin.  Lab Results  Component Value Date   INSULIN 8.7 06/19/2020   INSULIN 9.9 03/19/2020   Lab Results  Component Value Date   HGBA1C 5.4 03/19/2020   Vitamin D deficiency. Vitamin D level on 08/14/2020 was 55.0. Ergocalciferol was stopped and she converted to OTC Vitamin D3 5,000 IU daily.   Ref. Range 08/14/2020 15:19  Vitamin D, 25-Hydroxy Latest Ref Range: 30.0 - 100.0 ng/mL 55.0   At risk for heart disease. [name] is at a higher than average risk for cardiovascular disease due to hyperlipidemia and obesity.   Assessment/Plan:   Other hyperlipidemia. Cardiovascular risk and specific lipid/LDL goals reviewed.  We discussed several lifestyle modifications today and [name] will continue to work on diet, exercise and weight loss efforts. Orders and follow up as documented in patient record. Labs will be checked today.   Counseling Intensive lifestyle modifications are the first line treatment for this issue.  Dietary changes: Increase soluble fiber. Decrease simple carbohydrates.  Exercise changes: Moderate to vigorous-intensity aerobic activity 150 minutes per week if tolerated.  Lipid-lowering medications: see documented in medical record.  SOB (shortness of breath) on exertion. [name]'s shortness of breath appears to be obesity related and exercise induced. She has agreed to work on weight loss and gradually increase exercise to treat her exercise induced shortness of breath. Will continue to monitor closely. IC checked today.  Insulin resistance. [name] will continue to work on weight loss, exercise, and decreasing simple carbohydrates to help decrease the risk of diabetes. [name] agreed to follow-up with [place] as  directed to closely monitor her progress. Labs will be checked today.   Vitamin D deficiency. Low Vitamin D level contributes to fatigue and are associated with obesity, breast, and colon cancer. VITAMIN D 25 Hydroxy (Vit-D Deficiency, Fractures)  level will be checked today.   At risk for heart disease. [name] was given approximately 15 minutes of coronary artery disease prevention counseling today. She is 39 y.o. female and has risk factors for heart disease including obesity. We discussed intensive lifestyle modifications today with an emphasis on specific weight loss instructions and strategies.   Repetitive spaced learning was employed today to elicit superior memory formation and behavioral change.  Class 2 severe obesity with serious comorbidity and body mass index (BMI) of 37.0 to 37.9 in adult, unspecified obesity type ([place]).  [name] is currently in the action stage of change. As such, her goal is to continue with weight loss efforts. She has agreed to the Category 2 Plan + 100 protein calories. She will consistently track snack calories daily.  Exercise goals: [name] will continue walking 6,000 steps daily 7 times per week.  Behavioral modification strategies: increasing lean protein intake, decreasing simple carbohydrates, meal planning and cooking strategies, keeping healthy foods in the home, better snacking choices and planning for success.  [name] has agreed to follow-up with our clinic in 2 weeks. She was informed of the importance of frequent follow-up visits to maximize her success with intensive lifestyle modifications for her multiple health conditions.   [name] was informed we would discuss her lab results at her next visit unless there is a critical issue that needs to be addressed sooner. [name] agreed to keep her next visit at the agreed upon time to discuss these results.  Objective:   Blood pressure 114/82, pulse 74, temperature 98.5 F (36.9 C), temperature source Oral, height 5\' 3"  (1.6 m), weight 210 lb (95.3 kg), last menstrual period 02/11/2018, SpO2 97 %. Body mass index is 37.2 kg/m.  General: Cooperative, alert, well developed, in no acute distress. HEENT: Conjunctivae and lids  unremarkable. Cardiovascular: Regular rhythm.  Lungs: Normal work of breathing. Neurologic: No focal deficits.   Lab Results  Component Value Date   CREATININE 0.78 03/19/2020   BUN 9 03/19/2020   NA 138 03/19/2020   K 4.0 03/19/2020   CL 100 03/19/2020   CO2 25 03/19/2020   Lab Results  Component Value Date   ALT 11 03/19/2020   AST 18 03/19/2020   ALKPHOS 107 03/19/2020   BILITOT 0.2 03/19/2020   Lab Results  Component Value Date   HGBA1C 5.4 03/19/2020   Lab Results  Component Value Date   INSULIN 8.7 06/19/2020   INSULIN 9.9 03/19/2020   Lab Results  Component Value Date   TSH 1.360 03/19/2020   Lab Results  Component Value Date   CHOL 178 06/19/2020   HDL 47 06/19/2020   LDLCALC 122 (H) 06/19/2020   TRIG 46 06/19/2020   Lab Results  Component Value Date   WBC 9.2 03/19/2020   HGB 13.1 03/19/2020   HCT 40.9 03/19/2020   MCV 85 03/19/2020   PLT 345 03/19/2020   No results found for: IRON, TIBC, FERRITIN  Attestation Statements:   Reviewed by clinician on day of visit: allergies, medications, problem list, medical history, surgical history, family history, social history, and previous encounter notes.  IMichaelene Song, am acting as Location manager for [employer], NP-C   I have reviewed the above documentation for accuracy and completeness, and I agree  with the above. -  Broly Hatfield d. Konstance Happel, NP-C

## 2020-09-27 LAB — COMPREHENSIVE METABOLIC PANEL
ALT: 21 IU/L (ref 0–32)
AST: 15 IU/L (ref 0–40)
Albumin/Globulin Ratio: 1.4 (ref 1.2–2.2)
Albumin: 4 g/dL (ref 3.8–4.8)
Alkaline Phosphatase: 88 IU/L (ref 44–121)
BUN/Creatinine Ratio: 19 (ref 9–23)
BUN: 15 mg/dL (ref 6–20)
Bilirubin Total: 0.5 mg/dL (ref 0.0–1.2)
CO2: 26 mmol/L (ref 20–29)
Calcium: 9.1 mg/dL (ref 8.7–10.2)
Chloride: 103 mmol/L (ref 96–106)
Creatinine, Ser: 0.77 mg/dL (ref 0.57–1.00)
GFR calc Af Amer: 115 mL/min/{1.73_m2} (ref >59)
GFR calc non Af Amer: 100 mL/min/{1.73_m2} (ref >59)
Globulin, Total: 2.9 g/dL (ref 1.5–4.5)
Glucose: 79 mg/dL (ref 65–99)
Potassium: 4.4 mmol/L (ref 3.5–5.2)
Sodium: 142 mmol/L (ref 134–144)
Total Protein: 6.9 g/dL (ref 6.0–8.5)

## 2020-09-27 LAB — LIPID PANEL
Chol/HDL Ratio: 3.1 ratio (ref 0.0–4.4)
Cholesterol, Total: 165 mg/dL (ref 100–199)
HDL: 54 mg/dL (ref >39)
LDL Chol Calc (NIH): 97 mg/dL (ref 0–99)
Triglycerides: 74 mg/dL (ref 0–149)
VLDL Cholesterol Cal: 14 mg/dL (ref 5–40)

## 2020-09-27 LAB — INSULIN, RANDOM: INSULIN: 8.2 u[IU]/mL (ref 2.6–24.9)

## 2020-09-27 LAB — VITAMIN D 25 HYDROXY (VIT D DEFICIENCY, FRACTURES): Vit D, 25-Hydroxy: 67 ng/mL (ref 30.0–100.0)

## 2020-09-27 LAB — HEMOGLOBIN A1C
Est. average glucose Bld gHb Est-mCnc: 108 mg/dL
Hgb A1c MFr Bld: 5.4 % (ref 4.8–5.6)

## 2020-10-06 MED ORDER — BUPIVACAINE HCL 0.25 % IJ SOLN
4.0000 mL | INTRAMUSCULAR | Status: AC | PRN
  Administered 2020-09-02: 4 mL via INTRA_ARTICULAR

## 2020-10-06 MED ORDER — TRIAMCINOLONE ACETONIDE 40 MG/ML IJ SUSP
60.0000 mg | INTRAMUSCULAR | Status: AC | PRN
  Administered 2020-09-02: 60 mg via INTRA_ARTICULAR

## 2020-10-06 NOTE — Progress Notes (Signed)
Lauren Garza - 39 y.o. female MRN [id number]  Date of birth: 1981-01-01  Office Visit Note: Visit Date: 09/02/2020 PCP: Lin Landsman, MD Referred by: Lin Landsman, MD  Subjective: Chief Complaint  Patient presents with  . Right Hip - Pain  . Left Hip - Pain   HPI: Lauren Garza is a 39 y.o. female who comes in today for planned repeat Bilateral anesthetic hip arthrogram with fluoroscopic guidance.  The patient has failed conservative care including home exercise, medications, time and activity modification. Prior injection gave more than 50% relief for several months.  Injections from March helped until just recently.  This injection will be diagnostic and hopefully therapeutic.  Please see requesting physician notes for further details and justification.  She rates her pain as 8 out of 10 and is functionally limiting.  Referring: Dr. Sarina Ill  ROS Otherwise per HPI.  Assessment & Plan: Visit Diagnoses:  1. Trochanteric bursitis, left hip   2. Trochanteric bursitis, right hip     Plan: No additional findings.   Meds & Orders: No orders of the defined types were placed in this encounter.   Orders Placed This Encounter  Procedures  . Large Joint Inj  . XR C-ARM NO REPORT    Follow-up: No follow-ups on file.   Procedures: Large Joint Inj: bilateral greater trochanter on 09/02/2020 4:30 PM Indications: pain and diagnostic evaluation Details: 22 G 3.5 in needle, fluoroscopy-guided lateral approach  Arthrogram: No  Medications (Right): 60 mg triamcinolone acetonide 40 MG/ML; 4 mL bupivacaine 0.25 % Medications (Left): 60 mg triamcinolone acetonide 40 MG/ML; 4 mL bupivacaine 0.25 % Outcome: tolerated well, no immediate complications  There was excellent flow of contrast outlined the greater trochanteric bursa without vascular uptake. Procedure, treatment alternatives, risks and benefits explained, specific risks discussed. Consent was given by the patient.  Immediately prior to procedure a time out was called to verify the correct patient, procedure, equipment, support staff and site/side marked as required. Patient was prepped and draped in the usual sterile fashion.      No notes on file   Clinical History: MRI LUMBAR SPINE WITHOUT CONTRAST  TECHNIQUE: Multiplanar, multisequence MR imaging of the lumbar spine was performed. No intravenous contrast was administered.  COMPARISON:  None available.  FINDINGS: Segmentation: Standard. Lowest well-formed disc space labeled the L5-S1 level.  Alignment: Mild dextroscoliosis. Alignment otherwise normal preservation of the normal lumbar lordosis.  Vertebrae: Vertebral body height maintained without evidence for acute or chronic fracture. Bone marrow signal intensity within normal limits. No discrete or worrisome osseous lesions. No abnormal marrow edema.  Conus medullaris and cauda equina: Conus extends to the L1 level. Conus and cauda equina appear normal.  Paraspinal and other soft tissues: Paraspinous soft tissues within normal limits. Visualized visceral structures are normal.  Disc levels:  L1-2:  Unremarkable.  L2-3:  Unremarkable.  L3-4: Disc desiccation with minimal annular disc bulge. No stenosis or impingement.  L4-5: Normal interspace. Mild facet hypertrophy. No stenosis or impingement.  L5-S1: Normal interspace. Mild facet hypertrophy. No stenosis or impingement.  IMPRESSION: 1. Mild degenerative disc disease at L3-4 without stenosis or impingement. 2. Mild facet hypertrophy at L4-5 and L5-S1. 3. Underlying mild dextroscoliosis.   Electronically Signed   By: Jeannine Boga M.D.   On: 02/24/2020 22:54   She reports that she has quit smoking. She has never used smokeless tobacco.  Recent Labs    03/19/20 1140 09/26/20 1052  HGBA1C 5.4 5.4  Objective:  VS:  HT:    WT:   BMI:     BP:   HR: bpm  TEMP: ( )  RESP:  Physical  Exam Musculoskeletal:     Comments: No pain over the PSIS but she does have concordant pain over the greater trochanters.  No pain with hip rotation.  Good distal strength.     Ortho Exam  Imaging: No results found.  Past Medical/Family/Surgical/Social History: Medications & Allergies reviewed per EMR, new medications updated. Patient Active Problem List   Diagnosis Date Noted  . SOB (shortness of breath) on exertion 09/26/2020  . Vitamin B12 deficiency 08/19/2020  . Other hyperlipidemia, pure 08/19/2020  . Vitamin D deficiency 05/21/2020  . Asthma 05/21/2020  . Insulin resistance 05/06/2020  . Meralgia paresthetica of both lower extremities 01/16/2020  . Class 2 severe obesity with serious comorbidity and body mass index (BMI) of 37.0 to 37.9 in adult ([place]) 01/16/2020  . Abnormal laboratory test 03/09/2018  . Kidney stone on left side 02/06/2018  . S/P vaginal hysterectomy 02/01/2018  . Generalized headaches 10/25/2017   Past Medical History:  Diagnosis Date  . Abnormal Pap smear   . Acid reflux   . Allergies   . Asthma   . Bacterial vaginosis   . Bursitis   . Chlamydia trachomatis infection 2012  . Constipation   . Depression   . DUB (dysfunctional uterine bleeding)   . Family history of adverse reaction to anesthesia    mom has n and V past surgery  . Frequent headaches    migraines  . Hypertension    "not anymore", taken off BP meds per pt report  . Meralgia paresthetica   . Plantar fasciitis    right    Family History  Problem Relation Age of Onset  . Heart attack Father   . Hypertension Mother   . Diabetes Mother   . Other Mother        questionable nerve issue, has numbness in thighs too  . Sleep apnea Mother   . Obesity Mother   . Diabetes Maternal Grandmother    Past Surgical History:  Procedure Laterality Date  . GYNECOLOGIC CRYOSURGERY    . I & D EXTREMITY Right 04/04/2015   Procedure: IRRIGATION AND DEBRIDEMENT RIGHT WRIST;  Surgeon: Leanora Cover, MD;  Location: [facility];  Service: Orthopedics;  Laterality: Right;  . SINUS ENDO WITH FUSION N/A 01/25/2017   Procedure: SINUS ENDO WITH FUSION;  Surgeon: Melida Quitter, MD;  Location: [facility];  Service: ENT;  Laterality: N/A;  . TUBAL LIGATION    . VAGINAL HYSTERECTOMY Bilateral 02/01/2018   Procedure: HYSTERECTOMY VAGINAL WITH SALPINGECTOMY;  Surgeon: Chancy Milroy, MD;  Location: [facility];  Service: Gynecology;  Laterality: Bilateral;  . WISDOM TOOTH EXTRACTION    . WRIST SURGERY     Social History   Occupational History  . Occupation: Pensions consultant  Tobacco Use  . Smoking status: Former Research scientist (life sciences)  . Smokeless tobacco: Never Used  . Tobacco comment: rare smoker 9 years ago  Vaping Use  . Vaping Use: Never used  Substance and Sexual Activity  . Alcohol use: Yes    Comment: Rare, 1x year  . Drug use: No  . Sexual activity: Yes    Birth control/protection: Surgical

## 2020-10-10 ENCOUNTER — Other Ambulatory Visit: Payer: Self-pay

## 2020-10-10 ENCOUNTER — Encounter (INDEPENDENT_AMBULATORY_CARE_PROVIDER_SITE_OTHER): Payer: Self-pay | Admitting: Adult Health

## 2020-10-10 ENCOUNTER — Ambulatory Visit (INDEPENDENT_AMBULATORY_CARE_PROVIDER_SITE_OTHER): Payer: 59 | Admitting: Adult Health

## 2020-10-10 VITALS — BP 109/76 | HR 94 | Temp 98.3°F | Ht 63.0 in | Wt 210.0 lb

## 2020-10-10 DIAGNOSIS — E7849 Other hyperlipidemia: Secondary | ICD-10-CM

## 2020-10-10 DIAGNOSIS — E8881 Metabolic syndrome: Secondary | ICD-10-CM

## 2020-10-10 DIAGNOSIS — Z6837 Body mass index (BMI) 37.0-37.9, adult: Secondary | ICD-10-CM

## 2020-10-10 DIAGNOSIS — E559 Vitamin D deficiency, unspecified: Secondary | ICD-10-CM

## 2020-10-14 NOTE — Progress Notes (Signed)
Chief Complaint:   OBESITY Lauren Garza is here to discuss her progress with her obesity treatment plan along with follow-up of her obesity related diagnoses. Lauren Garza is on the Category 2 Plan + 100 calories and states she is following her eating plan approximately 95% of the time. Lauren Garza states she is exercising 0 minutes 0 times per week.  Today's visit was #: 14 Starting weight: 237 lbs Starting date: 03/19/2020 Today's weight: 210 lbs Today's date: 10/10/2020 Total lbs lost to date: 27 Total lbs lost since last in-office visit: 0  Interim History: Lauren Garza is concerned about eating on plan during her birthday dinner and over the Thanksgiving holiday. She and her family will go to [employer] and she will have the trio of fish (recommended grilled options) dish to celebrate her birthday.  Subjective:   Other hyperlipidemia. Lauren Garza has hyperlipidemia and has been trying to improve her cholesterol levels with intensive lifestyle modification including a low saturated fat diet, exercise and weight loss. She denies any chest pain, claudication or myalgias. Lipid panel on 09/26/2020 showed LDL now at goal at less than 100 with all other levels remaining stable. Lauren Garza is not on statin therapy. Dicussed labs with patient today.  Lab Results  Component Value Date   ALT 21 09/26/2020   AST 15 09/26/2020   ALKPHOS 88 09/26/2020   BILITOT 0.5 09/26/2020   Lab Results  Component Value Date   CHOL 165 09/26/2020   HDL 54 09/26/2020   LDLCALC 97 09/26/2020   TRIG 74 09/26/2020   CHOLHDL 3.1 09/26/2020   Insulin resistance. Lauren Garza has a diagnosis of insulin resistance based on her elevated fasting insulin level >5. She continues to work on diet and exercise to decrease her risk of diabetes. 09/26/2020 blood glucose 79, A1c 5.4 with an insulin level of 8.2. Insulin level continues to improve. Dicussed labs with patient today.  Lab Results  Component  Value Date   INSULIN 8.2 09/26/2020   INSULIN 8.7 06/19/2020   INSULIN 9.9 03/19/2020   Lab Results  Component Value Date   HGBA1C 5.4 09/26/2020   Vitamin D deficiency. Vitamin D level on 09/26/2020 was 67.0 - excellent. Lauren Garza is on OTC Vitamin D3 5,000 IU daily. Dicussed labs with patient today.   Ref. Range 09/26/2020 10:52  Vitamin D, 25-Hydroxy Latest Ref Range: 30.0 - 100.0 ng/mL 67.0   Assessment/Plan:   Other hyperlipidemia. Cardiovascular risk and specific lipid/LDL goals reviewed.  We discussed several lifestyle modifications today and Lauren Garza will continue to work on diet, exercise and weight loss efforts. Orders and follow up as documented in patient record. She will continue to follow the Category 2 meal plan as directed.   Counseling Intensive lifestyle modifications are the first line treatment for this issue.  Dietary changes: Increase soluble fiber. Decrease simple carbohydrates.  Exercise changes: Moderate to vigorous-intensity aerobic activity 150 minutes per week if tolerated.  Lipid-lowering medications: see documented in medical record.  Insulin resistance. Lauren Garza will continue to work on weight loss, exercise, and decreasing simple carbohydrates to help decrease the risk of diabetes. Lauren Garza agreed to follow-up with [place] as directed to closely monitor her progress. She will increase protein at each meal.  Vitamin D deficiency. Low Vitamin D level contributes to fatigue and are associated with obesity, breast, and colon cancer. She will continue to take Vitamin D3 5,000 every other day and will follow-up for routine testing of Vitamin D, at least 2-3 times per year to  avoid over-replacement.  Class 2 severe obesity with serious comorbidity and body mass index (BMI) of 37.0 to 37.9 in adult, unspecified obesity type ([place]).  Lauren Garza is currently in the action stage of change. As such, her goal is to continue with weight loss efforts. She has agreed to  the Category 2 Plan + 100 protein calories.   Handouts were provided on Thanksgiving and Resistance Bands.  Exercise goals: Lauren Garza will start exercising 15 minutes 3 times per week.  Behavioral modification strategies: increasing lean protein intake, meal planning and cooking strategies, holiday eating strategies , celebration eating strategies and planning for success.  Lauren Garza has agreed to follow-up with our clinic in 3 weeks. She was informed of the importance of frequent follow-up visits to maximize her success with intensive lifestyle modifications for her multiple health conditions.   Objective:   Blood pressure 109/76, pulse 94, temperature 98.3 F (36.8 C), height 5\' 3"  (1.6 m), weight 210 lb (95.3 kg), last menstrual period 02/11/2018, SpO2 100 %. Body mass index is 37.2 kg/m.  General: Cooperative, alert, well developed, in no acute distress. HEENT: Conjunctivae and lids unremarkable. Cardiovascular: Regular rhythm.  Lungs: Normal work of breathing. Neurologic: No focal deficits.   Lab Results  Component Value Date   CREATININE 0.77 09/26/2020   BUN 15 09/26/2020   NA 142 09/26/2020   K 4.4 09/26/2020   CL 103 09/26/2020   CO2 26 09/26/2020   Lab Results  Component Value Date   ALT 21 09/26/2020   AST 15 09/26/2020   ALKPHOS 88 09/26/2020   BILITOT 0.5 09/26/2020   Lab Results  Component Value Date   HGBA1C 5.4 09/26/2020   HGBA1C 5.4 03/19/2020   Lab Results  Component Value Date   INSULIN 8.2 09/26/2020   INSULIN 8.7 06/19/2020   INSULIN 9.9 03/19/2020   Lab Results  Component Value Date   TSH 1.360 03/19/2020   Lab Results  Component Value Date   CHOL 165 09/26/2020   HDL 54 09/26/2020   LDLCALC 97 09/26/2020   TRIG 74 09/26/2020   CHOLHDL 3.1 09/26/2020   Lab Results  Component Value Date   WBC 9.2 03/19/2020   HGB 13.1 03/19/2020   HCT 40.9 03/19/2020   MCV 85 03/19/2020   PLT 345 03/19/2020   No results found for: IRON, TIBC,  FERRITIN  Attestation Statements:   Reviewed by clinician on day of visit: allergies, medications, problem list, medical history, surgical history, family history, social history, and previous encounter notes.  Time spent on visit including pre-visit chart review and post-visit charting and care was 29 minutes.   I, Michaelene Song, am acting as Location manager for [employer], NP-C   I have reviewed the above documentation for accuracy and completeness, and I agree with the above. -  Kenadi Miltner d. Obediah Welles, NP-C

## 2020-10-29 ENCOUNTER — Encounter (INDEPENDENT_AMBULATORY_CARE_PROVIDER_SITE_OTHER): Payer: Self-pay | Admitting: Adult Health

## 2020-10-29 ENCOUNTER — Ambulatory Visit (INDEPENDENT_AMBULATORY_CARE_PROVIDER_SITE_OTHER): Payer: 59 | Admitting: Adult Health

## 2020-10-29 ENCOUNTER — Other Ambulatory Visit: Payer: Self-pay

## 2020-10-29 VITALS — BP 123/63 | HR 77 | Temp 98.2°F | Ht 63.0 in | Wt 213.0 lb

## 2020-10-29 DIAGNOSIS — E559 Vitamin D deficiency, unspecified: Secondary | ICD-10-CM

## 2020-10-29 DIAGNOSIS — E8881 Metabolic syndrome: Secondary | ICD-10-CM

## 2020-10-29 DIAGNOSIS — Z6837 Body mass index (BMI) 37.0-37.9, adult: Secondary | ICD-10-CM | POA: Diagnosis not present

## 2020-10-29 NOTE — Progress Notes (Signed)
Chief Complaint:   OBESITY Lauren ARRAZOLA is here to discuss her progress with her obesity treatment plan along with follow-up of her obesity related diagnoses. Lauren Garza is on the Category 2 Plan and states she is following her eating plan approximately 95% of the time. Lauren Garza states she is exercising 0 minutes 0 times per week.  Today's visit was #: 15 Starting weight: 237 lbs Starting date: 03/19/2020 Today's weight: 213 lbs Today's date: 10/29/2020 Total lbs lost to date: 24 Total lbs lost since last in-office visit: 0  Interim History: During Thanksgiving, Lauren Garza weighed out her [place] serving and green bean serving (with food scale), had chickpea based pasta, and a small serving of cranberry sauce. She celebrated her birthday at [facility]- focused on protein (ie, choose 4 sampler) and avoided simple CHO foods. In the last two weeks, she tried the resistance bands once and enjoyed it.  Subjective:   Vitamin D deficiency. Last Vitamin D level on 09/26/2020 was 67.0 - at goal! Lauren Garza is on OTC Women's multivitamin.   Ref. Range 09/26/2020 10:52  Vitamin D, 25-Hydroxy Latest Ref Range: 30.0 - 100.0 ng/mL 67.0   Insulin resistance. Lauren Garza has a diagnosis of insulin resistance based on her elevated fasting insulin level >5. She continues to work on diet and exercise to decrease her risk of diabetes. Insulin level is stable between 8 and 9 at the last few checks, however, is still above goal of 5.  Lab Results  Component Value Date   INSULIN 8.2 09/26/2020   INSULIN 8.7 06/19/2020   INSULIN 9.9 03/19/2020   Lab Results  Component Value Date   HGBA1C 5.4 09/26/2020   Assessment/Plan:   Vitamin D deficiency. Low Vitamin D level contributes to fatigue and are associated with obesity, breast, and colon cancer. She agrees to continue to take her OTC Women's Multivitamin as directed and will follow-up for routine testing of Vitamin D, at least 2-3 times  per year to avoid over-replacement.  Insulin resistance. Lauren Garza will continue to work on weight loss, exercise, increasing protein, and limiting simple carbohydrates to help decrease the risk of diabetes. Lauren Garza agreed to follow-up with [place] as directed to closely monitor her progress.  Class 2 severe obesity with serious comorbidity and body mass index (BMI) of 37.0 to 37.9 in adult, unspecified obesity type ([place]).  Lauren Garza is currently in the action stage of change. As such, her goal is to continue with weight loss efforts. She has agreed to the Category 2 Plan.   Exercise goals: Lauren Garza will start exercising 15 minutes 2 times per week.  Behavioral modification strategies: increasing lean protein intake, meal planning and cooking strategies and planning for success.  Lauren Garza has agreed to follow-up with our clinic in 2 weeks. She was informed of the importance of frequent follow-up visits to maximize her success with intensive lifestyle modifications for her multiple health conditions.   Objective:   Blood pressure 123/63, pulse 77, temperature 98.2 F (36.8 C), height 5\' 3"  (1.6 m), weight 213 lb (96.6 kg), last menstrual period 02/11/2018, SpO2 97 %. Body mass index is 37.73 kg/m.  General: Cooperative, alert, well developed, in no acute distress. HEENT: Conjunctivae and lids unremarkable. Cardiovascular: Regular rhythm.  Lungs: Normal work of breathing. Neurologic: No focal deficits.   Lab Results  Component Value Date   CREATININE 0.77 09/26/2020   BUN 15 09/26/2020   NA 142 09/26/2020   K 4.4 09/26/2020   CL 103 09/26/2020  CO2 26 09/26/2020   Lab Results  Component Value Date   ALT 21 09/26/2020   AST 15 09/26/2020   ALKPHOS 88 09/26/2020   BILITOT 0.5 09/26/2020   Lab Results  Component Value Date   HGBA1C 5.4 09/26/2020   HGBA1C 5.4 03/19/2020   Lab Results  Component Value Date   INSULIN 8.2 09/26/2020   INSULIN 8.7 06/19/2020   INSULIN 9.9  03/19/2020   Lab Results  Component Value Date   TSH 1.360 03/19/2020   Lab Results  Component Value Date   CHOL 165 09/26/2020   HDL 54 09/26/2020   LDLCALC 97 09/26/2020   TRIG 74 09/26/2020   CHOLHDL 3.1 09/26/2020   Lab Results  Component Value Date   WBC 9.2 03/19/2020   HGB 13.1 03/19/2020   HCT 40.9 03/19/2020   MCV 85 03/19/2020   PLT 345 03/19/2020   No results found for: IRON, TIBC, FERRITIN  Attestation Statements:   Reviewed by clinician on day of visit: allergies, medications, problem list, medical history, surgical history, family history, social history, and previous encounter notes.  Time spent on visit including pre-visit chart review and post-visit charting and care was 29 minutes.   I, Michaelene Song, am acting as Location manager for [employer], NP-C   I have reviewed the above documentation for accuracy and completeness, and I agree with the above. -  Lauren Hosmer d. Tripp Goins, NP-C

## 2020-11-07 ENCOUNTER — Emergency Department (HOSPITAL_BASED_OUTPATIENT_CLINIC_OR_DEPARTMENT_OTHER)
Admission: EM | Admit: 2020-11-07 | Discharge: 2020-11-07 | Disposition: A | Discharge status: Home / Self Care | Payer: 59 | Attending: Emergency Medicine | Admitting: Emergency Medicine

## 2020-11-07 ENCOUNTER — Encounter (HOSPITAL_BASED_OUTPATIENT_CLINIC_OR_DEPARTMENT_OTHER): Payer: Self-pay | Admitting: Emergency Medicine

## 2020-11-07 ENCOUNTER — Other Ambulatory Visit: Payer: Self-pay

## 2020-11-07 ENCOUNTER — Emergency Department (HOSPITAL_BASED_OUTPATIENT_CLINIC_OR_DEPARTMENT_OTHER): Payer: 59

## 2020-11-07 DIAGNOSIS — I10 Essential (primary) hypertension: Secondary | ICD-10-CM | POA: Insufficient documentation

## 2020-11-07 DIAGNOSIS — N83209 Unspecified ovarian cyst, unspecified side: Secondary | ICD-10-CM

## 2020-11-07 DIAGNOSIS — Z79899 Other long term (current) drug therapy: Secondary | ICD-10-CM | POA: Insufficient documentation

## 2020-11-07 DIAGNOSIS — J45909 Unspecified asthma, uncomplicated: Secondary | ICD-10-CM | POA: Insufficient documentation

## 2020-11-07 DIAGNOSIS — Z87891 Personal history of nicotine dependence: Secondary | ICD-10-CM | POA: Diagnosis not present

## 2020-11-07 DIAGNOSIS — N83201 Unspecified ovarian cyst, right side: Secondary | ICD-10-CM | POA: Diagnosis not present

## 2020-11-07 DIAGNOSIS — Z9851 Tubal ligation status: Secondary | ICD-10-CM | POA: Diagnosis not present

## 2020-11-07 DIAGNOSIS — Z7951 Long term (current) use of inhaled steroids: Secondary | ICD-10-CM | POA: Insufficient documentation

## 2020-11-07 DIAGNOSIS — R1032 Left lower quadrant pain: Secondary | ICD-10-CM

## 2020-11-07 DIAGNOSIS — R350 Frequency of micturition: Secondary | ICD-10-CM | POA: Diagnosis present

## 2020-11-07 LAB — COMPREHENSIVE METABOLIC PANEL
ALT: 20 U/L (ref 0–44)
AST: 19 U/L (ref 15–41)
Albumin: 3.8 g/dL (ref 3.5–5.0)
Alkaline Phosphatase: 68 U/L (ref 38–126)
Anion gap: 9 (ref 5–15)
BUN: 14 mg/dL (ref 6–20)
CO2: 25 mmol/L (ref 22–32)
Calcium: 9.2 mg/dL (ref 8.9–10.3)
Chloride: 103 mmol/L (ref 98–111)
Creatinine, Ser: 0.64 mg/dL (ref 0.44–1.00)
GFR, Estimated: >60 mL/min (ref >60)
Glucose, Bld: 89 mg/dL (ref 70–99)
Potassium: 4.4 mmol/L (ref 3.5–5.1)
Sodium: 137 mmol/L (ref 135–145)
Total Bilirubin: 0.6 mg/dL (ref 0.3–1.2)
Total Protein: 7.4 g/dL (ref 6.5–8.1)

## 2020-11-07 LAB — URINALYSIS, ROUTINE W REFLEX MICROSCOPIC
Bilirubin Urine: NEGATIVE
Glucose, UA: NEGATIVE mg/dL
Hgb urine dipstick: NEGATIVE
Ketones, ur: NEGATIVE mg/dL
Leukocytes,Ua: NEGATIVE
Nitrite: NEGATIVE
Protein, ur: NEGATIVE mg/dL
Specific Gravity, Urine: 1.02 (ref 1.005–1.030)
pH: 6.5 (ref 5.0–8.0)

## 2020-11-07 LAB — CBC WITH DIFFERENTIAL/PLATELET
Abs Immature Granulocytes: 0.03 10*3/uL (ref 0.00–0.07)
Basophils Absolute: 0.1 10*3/uL (ref 0.0–0.1)
Basophils Relative: 1 %
Eosinophils Absolute: 0.3 10*3/uL (ref 0.0–0.5)
Eosinophils Relative: 3 %
HCT: 41.9 % (ref 36.0–46.0)
Hemoglobin: 13.2 g/dL (ref 12.0–15.0)
Immature Granulocytes: 0 %
Lymphocytes Relative: 22 %
Lymphs Abs: 2 10*3/uL (ref 0.7–4.0)
MCH: 26.6 pg (ref 26.0–34.0)
MCHC: 31.5 g/dL (ref 30.0–36.0)
MCV: 84.5 fL (ref 80.0–100.0)
Monocytes Absolute: 0.6 10*3/uL (ref 0.1–1.0)
Monocytes Relative: 7 %
Neutro Abs: 6.2 10*3/uL (ref 1.7–7.7)
Neutrophils Relative %: 67 %
Platelets: 311 10*3/uL (ref 150–400)
RBC: 4.96 MIL/uL (ref 3.87–5.11)
RDW: 15.5 % (ref 11.5–15.5)
WBC: 9.1 10*3/uL (ref 4.0–10.5)
nRBC: 0 % (ref 0.0–0.2)

## 2020-11-07 MED ORDER — IBUPROFEN 600 MG PO TABS: 600.0000 mg | ORAL_TABLET | Freq: Four times a day (QID) | ORAL | 0 refills | Status: DC | PRN

## 2020-11-07 MED ORDER — KETOROLAC TROMETHAMINE 15 MG/ML IJ SOLN
15.0000 mg | Freq: Once | INTRAMUSCULAR | Status: AC
  Administered 2020-11-07: 15 mg via INTRAVENOUS
  Filled 2020-11-07: qty 1

## 2020-11-07 MED ORDER — FENTANYL CITRATE (PF) 100 MCG/2ML IJ SOLN
50.0000 ug | Freq: Once | INTRAMUSCULAR | Status: AC
  Administered 2020-11-07: 50 ug via INTRAVENOUS
  Filled 2020-11-07: qty 2

## 2020-11-07 MED ORDER — IOHEXOL 300 MG/ML  SOLN
100.0000 mL | Freq: Once | INTRAMUSCULAR | Status: AC | PRN
  Administered 2020-11-07: 100 mL via INTRAVENOUS

## 2020-11-07 MED ORDER — ONDANSETRON HCL 4 MG/2ML IJ SOLN
4.0000 mg | Freq: Once | INTRAMUSCULAR | Status: AC
  Administered 2020-11-07: 4 mg via INTRAVENOUS
  Filled 2020-11-07: qty 2

## 2020-11-07 NOTE — Discharge Instructions (Signed)
Please schedule a follow-up appointment with your gynecologist regarding your results today.  Take pain medicine as prescribed.  If you have any significant worsening of your pain, uncontrolled pain, vomiting or other new concerning symptom, return to ER for reassessment immediately.

## 2020-11-07 NOTE — ED Notes (Signed)
US at bedside

## 2020-11-07 NOTE — ED Provider Notes (Signed)
[facility] EMERGENCY DEPARTMENT Provider Note   CSN: [id number] Arrival date & time: 11/07/20  9379     History Chief Complaint  Patient presents with  . urinary urgency    Lauren Garza is a 39 y.o. female.  Presents to ER with concern for change in urination.  Over the past couple days she has noted increased urgency with urination as well as pain anytime she has to go to the bathroom.  Pain is worse in her lower abdomen as well as worse in her left side.  In between episodes with urination does not have any significant pain.  She denies any vaginal discharge, denies pelvic pain, no vaginal bleeding.  Sexually active with one female partner.  Denies any STD exposures.  History of hysterectomy, still has ovaries.  HPI     Past Medical History:  Diagnosis Date  . Abnormal Pap smear   . Acid reflux   . Allergies   . Asthma   . Bacterial vaginosis   . Bursitis   . Chlamydia trachomatis infection 2012  . Constipation   . Depression   . DUB (dysfunctional uterine bleeding)   . Family history of adverse reaction to anesthesia    mom has n and V past surgery  . Frequent headaches    migraines  . Hypertension    "not anymore", taken off BP meds per pt report  . Meralgia paresthetica   . Plantar fasciitis    right     Patient Active Problem List   Diagnosis Date Noted  . SOB (shortness of breath) on exertion 09/26/2020  . Vitamin B12 deficiency 08/19/2020  . Other hyperlipidemia, pure 08/19/2020  . Vitamin D deficiency 05/21/2020  . Asthma 05/21/2020  . Insulin resistance 05/06/2020  . Meralgia paresthetica of both lower extremities 01/16/2020  . Class 2 severe obesity with serious comorbidity and body mass index (BMI) of 37.0 to 37.9 in adult ([place]) 01/16/2020  . Abnormal laboratory test 03/09/2018  . Kidney stone on left side 02/06/2018  . S/P vaginal hysterectomy 02/01/2018  . Generalized headaches 10/25/2017    Past Surgical History:  Procedure  Laterality Date  . GYNECOLOGIC CRYOSURGERY    . I & D EXTREMITY Right 04/04/2015   Procedure: IRRIGATION AND DEBRIDEMENT RIGHT WRIST;  Surgeon: Leanora Cover, MD;  Location: [facility];  Service: Orthopedics;  Laterality: Right;  . SINUS ENDO WITH FUSION N/A 01/25/2017   Procedure: SINUS ENDO WITH FUSION;  Surgeon: Melida Quitter, MD;  Location: [facility];  Service: ENT;  Laterality: N/A;  . TUBAL LIGATION    . VAGINAL HYSTERECTOMY Bilateral 02/01/2018   Procedure: HYSTERECTOMY VAGINAL WITH SALPINGECTOMY;  Surgeon: Chancy Milroy, MD;  Location: [facility];  Service: Gynecology;  Laterality: Bilateral;  . WISDOM TOOTH EXTRACTION    . WRIST SURGERY       OB History    Gravida  3   Para  3   Term  3   Preterm  0   AB  0   Living  3     SAB  0   IAB  0   Ectopic  0   Multiple  0   Live Births              Family History  Problem Relation Age of Onset  . Heart attack Father   . Hypertension Mother   . Diabetes Mother   . Other Mother        questionable nerve  issue, has numbness in thighs too  . Sleep apnea Mother   . Obesity Mother   . Diabetes Maternal Grandmother     Social History   Tobacco Use  . Smoking status: Former Research scientist (life sciences)  . Smokeless tobacco: Never Used  . Tobacco comment: rare smoker 9 years ago  Vaping Use  . Vaping Use: Never used  Substance Use Topics  . Alcohol use: Yes    Comment: Rare, 1x year  . Drug use: No    Home Medications Prior to Admission medications   Medication Sig Start Date End Date Taking? Authorizing Provider  albuterol (ACCUNEB) 0.63 MG/3ML nebulizer solution Take by nebulization.   Yes [provider]  albuterol (VENTOLIN HFA) 108 (90 Base) MCG/ACT inhaler Inhale 1 puff into the lungs every 4 (four) hours as needed for wheezing or shortness of breath.    Yes [provider]  Black Elderberry (SAMBUCUS ELDERBERRY PO) Take 50 mg by mouth daily.   Yes [provider]  budesonide-formoterol  (SYMBICORT) 80-4.5 MCG/ACT inhaler Inhale 2 puffs into the lungs 2 (two) times daily.   Yes [provider]  cetirizine (ZYRTEC) 10 MG tablet Take 10 mg by mouth daily.  06/09/17  Yes [provider]  EPINEPHrine 0.3 mg/0.3 mL IJ SOAJ injection Inject 0.3 mg into the muscle once as needed.    Yes [provider]  fluticasone (FLONASE) 50 MCG/ACT nasal spray 1 spray by Each Nare route daily. 08/13/19  Yes [provider]  montelukast (SINGULAIR) 10 MG tablet Take 10 mg by mouth daily.  04/27/17  Yes [provider]  hydrOXYzine (VISTARIL) 25 MG capsule Take by mouth. 07/01/20   [provider]  ibuprofen (ADVIL) 600 MG tablet Take 1 tablet (600 mg total) by mouth every 6 (six) hours as needed. 11/07/20   Lucrezia Starch, MD  linaclotide Rolan Lipa) 145 MCG CAPS capsule Take by mouth. 09/22/19   [provider]  Multiple Vitamins-Minerals (ALIVE WOMENS GUMMY PO) Take 1 each by mouth daily.    [provider]  omeprazole (PRILOSEC) 40 MG capsule Take 40 mg by mouth daily.    [provider]  predniSONE (DELTASONE) 10 MG tablet Take 10 mg by mouth in the morning, at noon, and at bedtime. 06/18/20   [provider]  Probiotic Product (PROBIOTIC-10 PO) Take 2 capsules by mouth daily.    [provider]  tolterodine (DETROL LA) 4 MG 24 hr capsule  06/18/20   [provider]  valACYclovir (VALTREX) 1000 MG tablet Take 1,000 mg by mouth as needed. 11/25/19   [provider]    Allergies    Clindamycin/lincomycin, Dust mite extract, Flagyl [metronidazole hcl], and Lincomycin hcl  Review of Systems   Review of Systems  Constitutional: Negative for chills and fever.  HENT: Negative for ear pain and sore throat.   Eyes: Negative for pain and visual disturbance.  Respiratory: Negative for cough and shortness of breath.   Cardiovascular: Negative for chest pain and palpitations.  Gastrointestinal:  Positive for abdominal pain. Negative for vomiting.  Genitourinary: Positive for dysuria and urgency. Negative for hematuria.  Musculoskeletal: Negative for arthralgias and back pain.  Skin: Negative for color change and rash.  Neurological: Negative for seizures and syncope.  All other systems reviewed and are negative.   Physical Exam Updated Vital Signs BP 107/76 (BP Location: Left Arm)   Pulse 76   Temp 98.8 F (37.1 C) (Oral)   Resp 18   Ht  5\' 3"  (1.6 m)   Wt 95.3 kg   LMP 02/11/2018 (Exact Date)   SpO2 100%   BMI 37.20 kg/m   Physical Exam Vitals and nursing note reviewed.  Constitutional:      General: She is not in acute distress.    Appearance: She is well-developed and well-nourished.  HENT:     Head: Normocephalic and atraumatic.  Eyes:     Conjunctiva/sclera: Conjunctivae normal.  Cardiovascular:     Rate and Rhythm: Normal rate and regular rhythm.     Heart sounds: No murmur heard.   Pulmonary:     Effort: Pulmonary effort is normal. No respiratory distress.     Breath sounds: Normal breath sounds.  Abdominal:     Palpations: Abdomen is soft.     Tenderness: There is abdominal tenderness.     Comments: TTP in LLQ, suprapubic regions, no RLQ, no upper abd TTP  Musculoskeletal:        General: No deformity, signs of injury or edema.     Cervical back: Neck supple.  Skin:    General: Skin is warm and dry.     Capillary Refill: Capillary refill takes less than 2 seconds.  Neurological:     General: No focal deficit present.     Mental Status: She is alert and oriented to person, place, and time.  Psychiatric:        Mood and Affect: Mood and affect and mood normal.     ED Results / Procedures / Treatments   Labs (all labs ordered are listed, but only abnormal results are displayed) Labs Reviewed  URINALYSIS, ROUTINE W REFLEX MICROSCOPIC  CBC WITH DIFFERENTIAL/PLATELET  COMPREHENSIVE METABOLIC PANEL    EKG None  Radiology CT ABDOMEN PELVIS  W CONTRAST  Result Date: 11/07/2020 CLINICAL DATA:  Left lower quadrant pain and concern for diverticulitis. EXAM: CT ABDOMEN AND PELVIS WITH CONTRAST TECHNIQUE: Multidetector CT imaging of the abdomen and pelvis was performed using the standard protocol following bolus administration of intravenous contrast. CONTRAST:  142mL OMNIPAQUE IOHEXOL 300 MG/ML  SOLN COMPARISON:  Abdomen and pelvis CT 02/06/2018 FINDINGS: Lower chest:  No contributory findings. Hepatobiliary: No focal liver abnormality.No evidence of biliary obstruction or stone. Pancreas: Unremarkable. Spleen: Trace fluid around the spleen without primary splenic disease seen, likely reactive (peritoneal fluid also seen in the left lower quadrant. Adrenals/Urinary Tract: Negative adrenals. No hydronephrosis or stone. Unremarkable bladder. Stomach/Bowel:  No obstruction. No appendicitis. Vascular/Lymphatic: No acute vascular abnormality. No mass or adenopathy. Reproductive:Hysterectomy. Left ovarian enlargement and/or mass with heterogeneous peripheral enhancement/high-density and central low-density. Total this structure measures up to 5.2 cm. The structure is near midline with some distortion of the lower gonadal vessel, although not clearly twisted. The right ovary is in the high right pelvis with a simple ovoid cyst measuring up to 3.6 cm. Other: No ascites or pneumoperitoneum. Musculoskeletal: Sacroiliac sclerosis with vacuum phenomenon and spurring. No acute finding These results were called by telephone at the time of interpretation on 11/07/2020 at 11:03 am to provider [facility] , who verbally acknowledged these results. IMPRESSION: 1. Enlarged left ovary with position and heterogeneity potentially reflecting torsion, recommend pelvic ultrasound with Doppler. 2. Simple right adnexal cyst. Electronically Signed   By: Monte Fantasia M.D.   On: 11/07/2020 11:03   US PELVIC COMPLETE W TRANSVAGINAL AND TORSION R/O  Result Date:  11/07/2020 CLINICAL DATA:  LEFT lower quadrant and pelvic pain, abnormal CT with LEFT ovarian large mint versus mass EXAM: TRANSABDOMINAL  AND TRANSVAGINAL ULTRASOUND OF PELVIS DOPPLER ULTRASOUND OF OVARIES TECHNIQUE: Both transabdominal and transvaginal ultrasound examinations of the pelvis were performed. Transabdominal technique was performed for global imaging of the pelvis including uterus, ovaries, adnexal regions, and pelvic cul-de-sac. It was necessary to proceed with endovaginal exam following the transabdominal exam to visualize the adnexa. Color and duplex Doppler ultrasound was utilized to evaluate blood flow to the ovaries. COMPARISON:  CT abdomen and pelvis 11/07/2020 FINDINGS: Uterus Surgically absent Endometrium N/A Right ovary Measurements: 4.8 x 2.0 x 2.4 cm = volume: 13.4 mL. Grossly normal morphology without mass. Suboptimally visualized on transabdominal imaging, not visualized on transvaginal imaging. Left ovary Measurements: 5.5 x 4.0 x 6.0 cm = volume: 69.1 mL. Complex hypoechoic lesion within LEFT ovary 4.6 x 3.4 x 3.8 cm containing lacy internal architecture and scattered low level internal echogenicity most consistent with a hemorrhagic cyst; no follow-up imaging recommended. Blood flow present within LEFT ovary on color Doppler imaging. Pulsed Doppler evaluation of the LEFT ovary demonstrates normal low-resistance arterial and venous waveforms. RIGHT ovary was inadequately visualized for Doppler assessment. Other findings Trace free pelvic fluid. Additional simple cyst within RIGHT adnexa 2.7 x 2.0 x 2.6 cm consistent with simple paraovarian cyst; no follow-up imaging recommended. IMPRESSION: Surgical absence of uterus. Unremarkable RIGHT ovary. Complex cyst of the LEFT ovary 4.6 cm greatest size most consistent with a hemorrhagic cyst; no follow-up imaging recommended. Simple paraovarian cyst RIGHT adnexa 2.7 cm greatest size; no follow-up imaging recommended. No evidence of LEFT ovarian  torsion, RIGHT ovary suboptimally visualized. Electronically Signed   By: Lavonia Dana M.D.   On: 11/07/2020 12:21    Procedures Procedures (including critical care time)  Medications Ordered in ED Medications  ketorolac (TORADOL) 15 MG/ML injection 15 mg (15 mg Intravenous Given 11/07/20 1002)  iohexol (OMNIPAQUE) 300 MG/ML solution 100 mL (100 mLs Intravenous Contrast Given 11/07/20 1029)  fentaNYL (SUBLIMAZE) injection 50 mcg (50 mcg Intravenous Given 11/07/20 1115)  ondansetron (ZOFRAN) injection 4 mg (4 mg Intravenous Given 11/07/20 1115)    ED Course  I have reviewed the triage vital signs and the nursing notes.  Pertinent labs & imaging results that were available during my care of the patient were reviewed by me and considered in my medical decision making (see chart for details).  Clinical Course as of 11/07/20 1611  Thu Nov 07, 2020  1105 Called by radiologist, concern for possible torsion, recommending ultrasound to further evaluate, notified ultrasound technologist, they will prioritize patient for next study [RD]    Clinical Course User Index [RD] Lucrezia Starch, MD   MDM Rules/Calculators/A&P                         39 year old lady presents to the emergency room with concern for dysuria, urgency.  On exam noted tenderness palpation left lower quadrant region.  She was otherwise well-appearing with stable vital signs.  UA negative for infection, remainder of labs were grossly within normal limits.  No leukocytosis.  On reassessment, reviewed reassuring results however patient still had some pain on palpation.  Proceeded with CT abdomen pelvis to further evaluate.  CT concerning for hemorrhagic cyst versus ovarian torsion, radiologist recommending TVUS.  TVUS demonstrated findings consistent with hemorrhagic cyst and negative for torsion, good blood flow to the ovary.  Updated patient on results, pain is well controlled and she is well-appearing.  Reviewed return precautions  and recommended follow-up with her gynecologist.   After the  discussed management above, the patient was determined to be safe for discharge.  The patient was in agreement with this plan and all questions regarding their care were answered.  ED return precautions were discussed and the patient will return to the ED with any significant worsening of condition.  Final Clinical Impression(s) / ED Diagnoses Final diagnoses:  Hemorrhagic ovarian cyst    Rx / DC Orders ED Discharge Orders         Ordered    ibuprofen (ADVIL) 600 MG tablet  Every 6 hours PRN        11/07/20 1313           Lucrezia Starch, MD 11/08/20 1512

## 2020-11-07 NOTE — ED Triage Notes (Signed)
Pt reports urinary urgency, foul smelling urine and dark in color x 2 days. Denies dysuria and vaginal discharge.

## 2020-11-12 ENCOUNTER — Encounter (INDEPENDENT_AMBULATORY_CARE_PROVIDER_SITE_OTHER): Payer: Self-pay | Admitting: Adult Health

## 2020-11-12 ENCOUNTER — Other Ambulatory Visit: Payer: Self-pay

## 2020-11-12 ENCOUNTER — Ambulatory Visit (INDEPENDENT_AMBULATORY_CARE_PROVIDER_SITE_OTHER): Payer: 59 | Admitting: Adult Health

## 2020-11-12 VITALS — BP 128/77 | HR 63 | Temp 98.8°F | Ht 63.0 in | Wt 211.0 lb

## 2020-11-12 DIAGNOSIS — E8881 Metabolic syndrome: Secondary | ICD-10-CM | POA: Diagnosis not present

## 2020-11-12 DIAGNOSIS — N83209 Unspecified ovarian cyst, unspecified side: Secondary | ICD-10-CM

## 2020-11-12 DIAGNOSIS — Z6837 Body mass index (BMI) 37.0-37.9, adult: Secondary | ICD-10-CM | POA: Diagnosis not present

## 2020-11-12 NOTE — Progress Notes (Signed)
Chief Complaint:   OBESITY AMARII AMY is here to discuss her progress with her obesity treatment plan along with follow-up of her obesity related diagnoses. Verlaine is on the Category 2 Plan and states she is following her eating plan approximately 90% of the time. Demitra states she is going to the gym 40-60 minutes 2 times per week.  Today's visit was #: 86 Starting weight: 237 lbs Starting date: 03/19/2020 Today's weight: 211 lbs Today's date: 11/12/2020 Total lbs lost to date: 26 Total lbs lost since last in-office visit: 2  Interim History: Dajanique was seen in the ED 11/07/2020 for a left hemorrhagic cyst and was recommended to follow-up with her GYN. She denies pain at present. We reviewed labs/notes/imaging from the ED visit with the patient today. She has resumed regular exercise - treadmill and circuit training at her local gym.  Subjective:   Insulin resistance. Babbette has a diagnosis of insulin resistance based on her elevated fasting insulin level >5. She continues to work on diet and exercise to decrease her risk of diabetes. 09/26/2020 normal blood glucose and A1c with only slightly elevated insulin level at 8.2.  Lab Results  Component Value Date   INSULIN 8.2 09/26/2020   INSULIN 8.7 06/19/2020   INSULIN 9.9 03/19/2020   Lab Results  Component Value Date   HGBA1C 5.4 09/26/2020   Hemorrhagic ovarian cyst left. We reviewed with Lorena labs and imaging from her ED visit. She denies current pain at present.  Assessment/Plan:   Insulin resistance. Karissa will continue to work on weight loss, regular exercise, increasing protein, and decreasing simple carbohydrates to help decrease the risk of diabetes. Kinslei agreed to follow-up with [place] as directed to closely monitor her progress.  Hemorrhagic ovarian cyst left. [place] will keep her follow-up appointment with her GYN on 12/10/2020.  Class 2 severe obesity with serious comorbidity  and body mass index (BMI) of 37.0 to 37.9 in adult, unspecified obesity type ([place]).  Reesa is currently in the action stage of change. As such, her goal is to continue with weight loss efforts. She has agreed to the Category 2 Plan.   Handouts were provided on Holiday Strategies and Holiday Recipes.  Exercise goals: Camillia will continue going to the gym 40-60 minutes 2 times per week.  Behavioral modification strategies: increasing lean protein intake, meal planning and cooking strategies and planning for success.  Thia has agreed to follow-up with our clinic in 3 weeks. She was informed of the importance of frequent follow-up visits to maximize her success with intensive lifestyle modifications for her multiple health conditions.   Objective:   Blood pressure 128/77, pulse 63, temperature 98.8 F (37.1 C), height 5\' 3"  (1.6 m), weight 211 lb (95.7 kg), last menstrual period 02/11/2018, SpO2 100 %. Body mass index is 37.38 kg/m.  General: Cooperative, alert, well developed, in no acute distress. HEENT: Conjunctivae and lids unremarkable. Cardiovascular: Regular rhythm.  Lungs: Normal work of breathing. Neurologic: No focal deficits.   Lab Results  Component Value Date   CREATININE 0.64 11/07/2020   BUN 14 11/07/2020   NA 137 11/07/2020   K 4.4 11/07/2020   CL 103 11/07/2020   CO2 25 11/07/2020   Lab Results  Component Value Date   ALT 20 11/07/2020   AST 19 11/07/2020   ALKPHOS 68 11/07/2020   BILITOT 0.6 11/07/2020   Lab Results  Component Value Date   HGBA1C 5.4 09/26/2020   HGBA1C 5.4 03/19/2020  Lab Results  Component Value Date   INSULIN 8.2 09/26/2020   INSULIN 8.7 06/19/2020   INSULIN 9.9 03/19/2020   Lab Results  Component Value Date   TSH 1.360 03/19/2020   Lab Results  Component Value Date   CHOL 165 09/26/2020   HDL 54 09/26/2020   LDLCALC 97 09/26/2020   TRIG 74 09/26/2020   CHOLHDL 3.1 09/26/2020   Lab Results  Component Value  Date   WBC 9.1 11/07/2020   HGB 13.2 11/07/2020   HCT 41.9 11/07/2020   MCV 84.5 11/07/2020   PLT 311 11/07/2020   No results found for: IRON, TIBC, FERRITIN  Attestation Statements:   Reviewed by clinician on day of visit: allergies, medications, problem list, medical history, surgical history, family history, social history, and previous encounter notes.  Time spent on visit including pre-visit chart review and post-visit charting and care was 29 minutes.   I, Michaelene Song, am acting as Location manager for [employer], NP-C   I have reviewed the above documentation for accuracy and completeness, and I agree with the above. -  Mannie Ohlin d. Efstathios Sawin, NP-C

## 2020-12-05 ENCOUNTER — Ambulatory Visit (INDEPENDENT_AMBULATORY_CARE_PROVIDER_SITE_OTHER): Payer: 59 | Admitting: Adult Health

## 2020-12-05 ENCOUNTER — Other Ambulatory Visit: Payer: Self-pay

## 2020-12-05 ENCOUNTER — Encounter (INDEPENDENT_AMBULATORY_CARE_PROVIDER_SITE_OTHER): Payer: Self-pay | Admitting: Adult Health

## 2020-12-05 VITALS — BP 127/72 | HR 76 | Temp 98.8°F | Ht 63.0 in | Wt 209.0 lb

## 2020-12-05 DIAGNOSIS — E8881 Metabolic syndrome: Secondary | ICD-10-CM

## 2020-12-05 DIAGNOSIS — Z6834 Body mass index (BMI) 34.0-34.9, adult: Secondary | ICD-10-CM | POA: Diagnosis not present

## 2020-12-05 DIAGNOSIS — E559 Vitamin D deficiency, unspecified: Secondary | ICD-10-CM | POA: Diagnosis not present

## 2020-12-05 DIAGNOSIS — E669 Obesity, unspecified: Secondary | ICD-10-CM | POA: Diagnosis not present

## 2020-12-09 NOTE — Progress Notes (Addendum)
Chief Complaint:   OBESITY Lauren Garza is here to discuss her progress with her obesity treatment plan along with follow-up of her obesity related diagnoses. Lauren Garza is on the Category 2 Plan and states she is following her eating plan approximately 50% of the time. [name] states she is exercising 0 minutes 0 times per week.  Today's visit was #: 76 Starting weight: 237 lbs Starting date: 03/19/2020 Today's weight: 209 lbs Today's date: 12/05/2020 Total lbs lost to date: 28 lbs Total lbs lost since last in-office visit: 2 lbs  Interim History: Lauren Garza's PCP started her on prednisone 10 mg TID for a ten day course for swelling/pain of the left hip/thigh/groin/genital area. She has a follow up with her GYN on 12/10/2020.   Subjective:   1. Insulin resistance [name] has a diagnosis of insulin resistance based on her elevated fasting insulin level >5. She continues to work on diet and exercise to decrease her risk of diabetes. 09/26/2020 normal blood glucose and A1c with only a slightly elevated insulin level of 8.2.  Lab Results  Component Value Date   INSULIN 8.2 09/26/2020   INSULIN 8.7 06/19/2020   INSULIN 9.9 03/19/2020   Lab Results  Component Value Date   HGBA1C 5.4 09/26/2020    2. Vitamin D deficiency Lauren Garza's Vitamin D level was 67 on 120/28/2021. She is currently taking OTC Women's multi-vitamin. She denies nausea, vomiting or muscle weakness.  Ref. Range 09/26/2020 10:52  Vitamin D, 25-Hydroxy Latest Ref Range: 30.0 - 100.0 ng/mL 67.0   Assessment/Plan:   1. Insulin resistance [name] will continue to work on weight loss, exercise, and decreasing simple carbohydrates to help decrease the risk of diabetes. [name] agreed to follow-up with [place] as directed to closely monitor her progress. Increase protein, decrease simple carbohydrate intake. Continue Category 2 meal plan.  2. Vitamin D deficiency Low Vitamin D level contributes to fatigue and are  associated with obesity, breast, and colon cancer. Will follow-up for routine testing of Vitamin D, at least 2-3 times per year to avoid over-replacement. Continue OTC supplement.  3. Class 1 obesity with serious comorbidity and body mass index (BMI) of 34.0 to 34.9 in adult, unspecified obesity type Lauren Garza is currently in the action stage of change. As such, her goal is to continue with weight loss efforts. She has agreed to the Category 2 Plan.   Exercise goals: Resume cardio and weight training at [facility].  Behavioral modification strategies: increasing lean protein intake, decreasing simple carbohydrates, increasing water intake, meal planning and cooking strategies and planning for success.  Lauren Garza has agreed to follow-up with our clinic in 2 weeks. She was informed of the importance of frequent follow-up visits to maximize her success with intensive lifestyle modifications for her multiple health conditions.   Objective:   Blood pressure 127/72, pulse 76, temperature 98.8 F (37.1 C), temperature source Oral, height 5\' 3"  (1.6 m), weight 209 lb (94.8 kg), last menstrual period 02/11/2018, SpO2 100 %. Body mass index is 37.02 kg/m.  General: Cooperative, alert, well developed, in no acute distress. HEENT: Conjunctivae and lids unremarkable. Cardiovascular: Regular rhythm.  Lungs: Normal work of breathing. Neurologic: No focal deficits.   Lab Results  Component Value Date   CREATININE 0.64 11/07/2020   BUN 14 11/07/2020   NA 137 11/07/2020   K 4.4 11/07/2020   CL 103 11/07/2020   CO2 25 11/07/2020   Lab Results  Component Value Date   ALT 20 11/07/2020   AST  19 11/07/2020   ALKPHOS 68 11/07/2020   BILITOT 0.6 11/07/2020   Lab Results  Component Value Date   HGBA1C 5.4 09/26/2020   HGBA1C 5.4 03/19/2020   Lab Results  Component Value Date   INSULIN 8.2 09/26/2020   INSULIN 8.7 06/19/2020   INSULIN 9.9 03/19/2020   Lab Results  Component Value  Date   TSH 1.360 03/19/2020   Lab Results  Component Value Date   CHOL 165 09/26/2020   HDL 54 09/26/2020   LDLCALC 97 09/26/2020   TRIG 74 09/26/2020   CHOLHDL 3.1 09/26/2020   Lab Results  Component Value Date   WBC 9.1 11/07/2020   HGB 13.2 11/07/2020   HCT 41.9 11/07/2020   MCV 84.5 11/07/2020   PLT 311 11/07/2020    Attestation Statements:   Reviewed by clinician on day of visit: allergies, medications, problem list, medical history, surgical history, family history, social history, and previous encounter notes.  Time spent on visit including pre-visit chart review and post-visit care and charting was 28 minutes.   Coral Ceo, am acting as Location manager for Mina Marble, NP.  I have reviewed the above documentation for accuracy and completeness, and I agree with the above. -  Katy d. Danford, NP-C

## 2020-12-10 ENCOUNTER — Encounter: Payer: Self-pay | Admitting: *Deleted

## 2020-12-10 ENCOUNTER — Ambulatory Visit (INDEPENDENT_AMBULATORY_CARE_PROVIDER_SITE_OTHER): Payer: 59 | Admitting: Obstetrics and Gynecology

## 2020-12-10 ENCOUNTER — Other Ambulatory Visit: Payer: Self-pay

## 2020-12-10 ENCOUNTER — Encounter: Payer: Self-pay | Admitting: Obstetrics and Gynecology

## 2020-12-10 VITALS — BP 129/74 | HR 83 | Wt 214.5 lb

## 2020-12-10 DIAGNOSIS — N83202 Unspecified ovarian cyst, left side: Secondary | ICD-10-CM | POA: Diagnosis not present

## 2020-12-10 DIAGNOSIS — N83209 Unspecified ovarian cyst, unspecified side: Secondary | ICD-10-CM

## 2020-12-10 NOTE — Progress Notes (Signed)
Pt is in office for f/u ovarian cyst. Was seen at ED in December 2021.

## 2020-12-10 NOTE — Progress Notes (Signed)
Patient ID: Lauren Garza, female   DOB: 1981-03-18, 40 y.o.   MRN: [id number]   Ms Selvey presents for ER follow up from 12/9 for left ovarian cyst. Pt reports doing well now. Denies any pain, bowel or bladder dysfunction  PE AF VSS Lungs clear Heart RRR Abd soft +BS  A/P Left ovarian cyst Will check GYN U/S  F/U per GYN U/S results

## 2020-12-10 NOTE — Patient Instructions (Signed)
Ovarian Cyst  An ovarian cyst is a fluid-filled sac on an ovary. Most of these cysts go away on their own and are not cancer. Some cysts need treatment. What are the causes?  Ovarian hyperstimulation syndrome. Some medicines may lead to this problem.  Polycystic ovarian syndrome (PCOS). Problems with body chemicals (hormones) can lead to this condition.  The normal menstrual cycle. What increases the risk?  Being overweight or very overweight.  Taking medicines to increase your chance of getting pregnant.  Using some types of birth control.  Smoking. What are the signs or symptoms? Many ovarian cysts do not cause symptoms. If you get symptoms, you may have:  Pain or pressure in the area between the hip bones.  Pain in the lower belly.  Pain during sex.  Swelling in the lower belly.  Periods that are not regular.  Pain with periods. How is this treated? Many ovarian cysts go away on their own without treatment. If you need treatment, it may include:  Medicines for pain.  Fluid taken out of the cyst.  The cyst being taken out.  Birth control pills or other medicines.  Surgery to remove the ovary. Follow these instructions at home:  Take over-the-counter and prescription medicines only as told by your doctor.  Ask your doctor if you should avoid driving or using machines while you are taking your medicine.  Get exams and Pap tests as told by your doctor.  Return to your normal activities when your doctor says that it is safe.  Do not smoke or use any products that contain nicotine or tobacco. If you need help quitting, ask your doctor.  Keep all follow-up visits. Contact a doctor if:  Your periods: ? Are late. ? Are not regular. ? Stop. ? Are painful.  You have pain in the area between your hip bones, and the pain does not go away.  You feel pressure on your bladder.  You have trouble peeing.  You feel full, or your belly hurts, swells, or  bloats.  You gain or lose weight without trying, or you are less hungry than normal.  You feel pain and pressure in your back.  You feel pain and pressure in the area between your hip bones.  You think you may be pregnant. Get help right away if:  You have pain in your belly that is very bad or gets worse.  You have pain in the area between your hip bones, and the pain is very bad or gets worse.  You cannot eat or drink without vomiting.  You get a fever or chills all of a sudden.  Your period is a lot heavier than usual. Summary  An ovarian cyst is a fluid-filled sac on an ovary.  Some cysts may cause problems and need treatment.  Most of these cysts go away on their own. This information is not intended to replace advice given to you by your health care provider. Make sure you discuss any questions you have with your health care provider. Document Revised: 04/25/2020 Document Reviewed: 04/25/2020 Elsevier Patient Education  2021 [employer] Inc.  

## 2020-12-19 ENCOUNTER — Telehealth (INDEPENDENT_AMBULATORY_CARE_PROVIDER_SITE_OTHER): Payer: 59 | Admitting: Adult Health

## 2020-12-19 ENCOUNTER — Encounter (INDEPENDENT_AMBULATORY_CARE_PROVIDER_SITE_OTHER): Payer: Self-pay | Admitting: Adult Health

## 2020-12-19 ENCOUNTER — Other Ambulatory Visit: Payer: Self-pay

## 2020-12-19 DIAGNOSIS — N83209 Unspecified ovarian cyst, unspecified side: Secondary | ICD-10-CM | POA: Diagnosis not present

## 2020-12-19 DIAGNOSIS — K08409 Partial loss of teeth, unspecified cause, unspecified class: Secondary | ICD-10-CM

## 2020-12-19 DIAGNOSIS — Z6837 Body mass index (BMI) 37.0-37.9, adult: Secondary | ICD-10-CM

## 2020-12-19 DIAGNOSIS — G4700 Insomnia, unspecified: Secondary | ICD-10-CM | POA: Diagnosis not present

## 2020-12-23 DIAGNOSIS — K08409 Partial loss of teeth, unspecified cause, unspecified class: Secondary | ICD-10-CM | POA: Insufficient documentation

## 2020-12-23 DIAGNOSIS — G4700 Insomnia, unspecified: Secondary | ICD-10-CM | POA: Insufficient documentation

## 2020-12-23 NOTE — Progress Notes (Signed)
TeleHealth Visit:  Due to the COVID-19 pandemic, this visit was completed with telemedicine (audio/video) technology to reduce patient and provider exposure as well as to preserve personal protective equipment.   Lauren Garza has verbally consented to this TeleHealth visit. The patient is located at home, the provider is located at the [facility] office. The participants in this visit include the listed provider and patient. The visit was conducted today via video.   Chief Complaint: OBESITY Lauren Garza is here to discuss her progress with her obesity treatment plan along with follow-up of her obesity related diagnoses. Lauren Garza is on the Category 2 Plan and states she is following her eating plan approximately 75% of the time. Lauren Garza states she is exercising 0 minutes 0 times per week.  Today's visit was #: 18 Starting weight: 237 lbs Starting date: 03/19/2020  Interim History: Pt had a tooth extracted yesterday, top left mouth. She is unable to masticate food. She was unable to pick up oral narcotic due to pharmacy system malfunction. She has been using ice and NSAIDs for analgesia. Her sister-in-law passed away and she will be attending the funeral later this afternoon- she was 28 and married to her brother.   Subjective:   1. History of tooth extraction, unspecified edentulism class Pt had 1 tooth extracted from the top left of mouth on 12/18/2020. She is unable to masticate food. Discussed soft/liquid food options.  2. Hemorrhagic ovarian cyst left Pt denies pain at present. She has pelvic complete with transvaginal ultrasound scheduled on 12/24/2020  3. Insomnia, unspecified type Pt's 65 year old sister-in-law passed away form stomach cancer. She was married to her brother. She has had insomnia since her diagnosis and subsequent death. PCP Rx'd pt Ambien.  Assessment/Plan:   1. History of tooth extraction, unspecified edentulism class Recommend soft/purred food  sources. Sent MyChart message with food options and protein goals for each meal.  2. Hemorrhagic ovarian cyst left Keep ultrasound appointment on 12/24/2020.  3. Insomnia, unspecified type Use Ambien as directed. DO NOT take with oral narcotics (for tooth extraction). PDMP reviewed for prescription Ambien 10 mg count 30 on 12/13/2020.  4. Class 2 severe obesity with serious comorbidity and body mass index (BMI) of 37.0 to 37.9 in adult, unspecified obesity type (HCC) Lauren Garza is currently in the action stage of change. As such, her goal is to continue with weight loss efforts. She has agreed to the Category 2 Plan.   While recovering from oral surgery, focus on following protein goals: breakfast 20+ grams, lunch 30+ grams, and dinner 35+ grams.  Exercise goals: No exercise has been prescribed at this time.  Behavioral modification strategies: increasing lean protein intake, increasing water intake, no skipping meals and planning for success.  Lauren Garza has agreed to follow-up with our clinic in 2 weeks. She was informed of the importance of frequent follow-up visits to maximize her success with intensive lifestyle modifications for her multiple health conditions.  Objective:   VITALS: Per patient if applicable, see vitals. GENERAL: Alert and in no acute distress. CARDIOPULMONARY: No increased WOB. Speaking in clear sentences.  PSYCH: Pleasant and cooperative. Speech normal rate and rhythm. Affect is appropriate. Insight and judgement are appropriate. Attention is focused, linear, and appropriate.  NEURO: Oriented as arrived to appointment on time with no prompting.   Lab Results  Component Value Date   CREATININE 0.64 11/07/2020   BUN 14 11/07/2020   NA 137 11/07/2020   K 4.4 11/07/2020   CL  103 11/07/2020   CO2 25 11/07/2020   Lab Results  Component Value Date   ALT 20 11/07/2020   AST 19 11/07/2020   ALKPHOS 68 11/07/2020   BILITOT 0.6 11/07/2020   Lab Results  Component  Value Date   HGBA1C 5.4 09/26/2020   HGBA1C 5.4 03/19/2020   Lab Results  Component Value Date   INSULIN 8.2 09/26/2020   INSULIN 8.7 06/19/2020   INSULIN 9.9 03/19/2020   Lab Results  Component Value Date   TSH 1.360 03/19/2020   Lab Results  Component Value Date   CHOL 165 09/26/2020   HDL 54 09/26/2020   LDLCALC 97 09/26/2020   TRIG 74 09/26/2020   CHOLHDL 3.1 09/26/2020   Lab Results  Component Value Date   WBC 9.1 11/07/2020   HGB 13.2 11/07/2020   HCT 41.9 11/07/2020   MCV 84.5 11/07/2020   PLT 311 11/07/2020   No results found for: IRON, TIBC, FERRITIN  Attestation Statements:   Reviewed by clinician on day of visit: allergies, medications, problem list, medical history, surgical history, family history, social history, and previous encounter notes.  Time spent on visit including pre-visit chart review and post-visit charting and care was 27 minutes.   Coral Ceo, am acting as Location manager for Mina Marble, NP.  I have reviewed the above documentation for accuracy and completeness, and I agree with the above. - Jazlin Tapscott d. Yanni Ruberg, NP-C

## 2020-12-24 ENCOUNTER — Other Ambulatory Visit: Payer: Self-pay

## 2020-12-24 ENCOUNTER — Ambulatory Visit
Admission: RE | Admit: 2020-12-24 | Discharge: 2020-12-24 | Disposition: A | Discharge status: Home / Self Care | Payer: 59 | Source: Ambulatory Visit | Attending: Obstetrics and Gynecology | Admitting: Obstetrics and Gynecology

## 2020-12-24 DIAGNOSIS — N83209 Unspecified ovarian cyst, unspecified side: Secondary | ICD-10-CM | POA: Diagnosis present

## 2021-01-02 ENCOUNTER — Other Ambulatory Visit: Payer: Self-pay

## 2021-01-02 ENCOUNTER — Ambulatory Visit (INDEPENDENT_AMBULATORY_CARE_PROVIDER_SITE_OTHER): Payer: 59 | Admitting: Adult Health

## 2021-01-02 ENCOUNTER — Encounter (INDEPENDENT_AMBULATORY_CARE_PROVIDER_SITE_OTHER): Payer: Self-pay | Admitting: Adult Health

## 2021-01-02 VITALS — BP 104/63 | HR 60 | Temp 98.2°F | Ht 63.0 in | Wt 211.0 lb

## 2021-01-02 DIAGNOSIS — Z6837 Body mass index (BMI) 37.0-37.9, adult: Secondary | ICD-10-CM | POA: Diagnosis not present

## 2021-01-02 DIAGNOSIS — E559 Vitamin D deficiency, unspecified: Secondary | ICD-10-CM

## 2021-01-02 DIAGNOSIS — G4700 Insomnia, unspecified: Secondary | ICD-10-CM | POA: Diagnosis not present

## 2021-01-06 NOTE — Progress Notes (Signed)
Chief Complaint:   OBESITY Lauren Garza is here to discuss her progress with her obesity treatment plan along with follow-up of her obesity related diagnoses. Lauren Garza is on the Category 2 Plan and states she is following her eating plan approximately 50% of the time. Lauren Garza states she is doing 0 minutes 0 times per week.  Today's visit was #: 38 Starting weight: 237 lbs Starting date: 03/19/2020 Today's weight: 211 lbs Today's date: 01/02/2021 Total lbs lost to date: 26 lbs Total lbs lost since last in-office visit: 0  Interim History: Pt's tooth extraction site is healing well. She has not needed Vicodin for over 2 weeks. She is able to masticate all types of food.  Interval goal: stretch 10 minutes 2 times a week and follow category 2 at least 75% of the time.  Subjective:   1. Insomnia, unspecified type PDMD reviewed. Zolpidem 10 mg was filled 12/13/2020 for 30 count. Pt's reports her last dose was 12/27/2020. She reports good quality of sleep aided with OTC Melatonin 10 mg, 2 tabs at bedtime.   2. Vitamin D deficiency Lauren Garza's Vitamin D level was 67.0 on 09/26/2020. She was previously on prescription Vit D but is currently taking OTC multi-vitamin. She denies nausea, vomiting or muscle weakness.   Ref. Range 09/26/2020 10:52  Vitamin D, 25-Hydroxy Latest Ref Range: 30.0 - 100.0 ng/mL 67.0   Assessment/Plan:   1. Insomnia, unspecified type Continue OTC Melatonin. Remain off Zolpidem.  2. Vitamin D deficiency Low Vitamin D level contributes to fatigue and are associated with obesity, breast, and colon cancer. She agrees to continue to take OTC women's multivitamin daily and will follow-up for routine testing of Vitamin D, at least 2-3 times per year to avoid over-replacement.  Check labs at next OV.  3. Class 2 severe obesity with serious comorbidity and body mass index (BMI) of 37.0 to 37.9 in adult, unspecified obesity type (HCC) Lauren Garza is currently in the action  stage of change. As such, her goal is to continue with weight loss efforts. She has agreed to the Category 2 Plan.   Exercise goals: Stretch 10 minutes 2 times a week  Behavioral modification strategies: increasing lean protein intake, meal planning and cooking strategies and planning for success.  Lauren Garza has agreed to follow-up with our clinic in 2 weeks. She was informed of the importance of frequent follow-up visits to maximize her success with intensive lifestyle modifications for her multiple health conditions.   Objective:   Blood pressure 104/63, pulse 60, temperature 98.2 F (36.8 C), height 5\' 3"  (1.6 m), weight 211 lb (95.7 kg), last menstrual period 02/11/2018, SpO2 98 %. Body mass index is 37.38 kg/m.  General: Cooperative, alert, well developed, in no acute distress. HEENT: Conjunctivae and lids unremarkable. Cardiovascular: Regular rhythm.  Lungs: Normal work of breathing. Neurologic: No focal deficits.   Lab Results  Component Value Date   CREATININE 0.64 11/07/2020   BUN 14 11/07/2020   NA 137 11/07/2020   K 4.4 11/07/2020   CL 103 11/07/2020   CO2 25 11/07/2020   Lab Results  Component Value Date   ALT 20 11/07/2020   AST 19 11/07/2020   ALKPHOS 68 11/07/2020   BILITOT 0.6 11/07/2020   Lab Results  Component Value Date   HGBA1C 5.4 09/26/2020   HGBA1C 5.4 03/19/2020   Lab Results  Component Value Date   INSULIN 8.2 09/26/2020   INSULIN 8.7 06/19/2020   INSULIN 9.9 03/19/2020   Lab Results  Component Value Date   TSH 1.360 03/19/2020   Lab Results  Component Value Date   CHOL 165 09/26/2020   HDL 54 09/26/2020   LDLCALC 97 09/26/2020   TRIG 74 09/26/2020   CHOLHDL 3.1 09/26/2020   Lab Results  Component Value Date   WBC 9.1 11/07/2020   HGB 13.2 11/07/2020   HCT 41.9 11/07/2020   MCV 84.5 11/07/2020   PLT 311 11/07/2020   No results found for: IRON, TIBC, FERRITIN  Attestation Statements:   Reviewed by clinician on day of  visit: allergies, medications, problem list, medical history, surgical history, family history, social history, and previous encounter notes.  Time spent on visit including pre-visit chart review and post-visit care and charting was 32 minutes.   Coral Ceo, am acting as Location manager for Mina Marble, NP.  I have reviewed the above documentation for accuracy and completeness, and I agree with the above. -  Dayane Hillenburg d. Johari Pinney. NP-C

## 2021-01-16 ENCOUNTER — Ambulatory Visit (INDEPENDENT_AMBULATORY_CARE_PROVIDER_SITE_OTHER): Payer: 59 | Admitting: Adult Health

## 2021-01-27 ENCOUNTER — Ambulatory Visit (INDEPENDENT_AMBULATORY_CARE_PROVIDER_SITE_OTHER): Payer: 59 | Admitting: Adult Health

## 2021-01-27 ENCOUNTER — Encounter (INDEPENDENT_AMBULATORY_CARE_PROVIDER_SITE_OTHER): Payer: Self-pay | Admitting: Adult Health

## 2021-01-27 ENCOUNTER — Other Ambulatory Visit: Payer: Self-pay

## 2021-01-27 VITALS — BP 106/76 | HR 84 | Temp 98.7°F | Ht 63.0 in | Wt 213.0 lb

## 2021-01-27 DIAGNOSIS — G4700 Insomnia, unspecified: Secondary | ICD-10-CM

## 2021-01-27 DIAGNOSIS — Z6837 Body mass index (BMI) 37.0-37.9, adult: Secondary | ICD-10-CM

## 2021-01-27 DIAGNOSIS — Z9189 Other specified personal risk factors, not elsewhere classified: Secondary | ICD-10-CM | POA: Diagnosis not present

## 2021-01-27 DIAGNOSIS — E8881 Metabolic syndrome: Secondary | ICD-10-CM | POA: Diagnosis not present

## 2021-01-28 LAB — HEMOGLOBIN A1C
Est. average glucose Bld gHb Est-mCnc: 105 mg/dL
Hgb A1c MFr Bld: 5.3 % (ref 4.8–5.6)

## 2021-01-28 LAB — COMPREHENSIVE METABOLIC PANEL
ALT: 33 IU/L — ABNORMAL HIGH (ref 0–32)
AST: 34 IU/L (ref 0–40)
Albumin/Globulin Ratio: 1.5 (ref 1.2–2.2)
Albumin: 4.4 g/dL (ref 3.8–4.8)
Alkaline Phosphatase: 101 IU/L (ref 44–121)
BUN/Creatinine Ratio: 17 (ref 9–23)
BUN: 15 mg/dL (ref 6–20)
Bilirubin Total: 0.5 mg/dL (ref 0.0–1.2)
CO2: 21 mmol/L (ref 20–29)
Calcium: 9.5 mg/dL (ref 8.7–10.2)
Chloride: 102 mmol/L (ref 96–106)
Creatinine, Ser: 0.86 mg/dL (ref 0.57–1.00)
Globulin, Total: 2.9 g/dL (ref 1.5–4.5)
Glucose: 81 mg/dL (ref 65–99)
Potassium: 4.5 mmol/L (ref 3.5–5.2)
Sodium: 141 mmol/L (ref 134–144)
Total Protein: 7.3 g/dL (ref 6.0–8.5)
eGFR: 89 mL/min/{1.73_m2} (ref >59)

## 2021-01-28 LAB — INSULIN, RANDOM: INSULIN: 8.1 u[IU]/mL (ref 2.6–24.9)

## 2021-01-28 NOTE — Progress Notes (Signed)
Chief Complaint:   OBESITY Lauren Garza is here to discuss her progress with her obesity treatment plan along with follow-up of her obesity related diagnoses. Lauren Garza is on the Category 2 Plan and states she is following her eating plan approximately 50% of the time. Lauren Garza states she is stretches 10 minutes 2 times per week.  Today's visit was #: 20 Starting weight: 237 lbs Starting date: 03/19/2020 Today's weight: 213 lbs Today's date: 01/27/2021 Total lbs lost to date: 24 lbs Total lbs lost since last in-office visit: 0  Interim History: Lauren Garza will eat off the plan when she runs out of "category 2 food". For example- chicken drum stick (baked).  Interval goal: Increase water intake and continue stretching 2-3 times a week for 10 minutes.  Subjective:   1. Insulin resistance Pt's insulin level was slightly above goal on 09/26/2020 at 8.2, with normal blood glucose and A1c.   2. Insomnia, unspecified type Pt reports that she has not used Ambien in over a month.  3. At risk for diabetes mellitus Lauren Garza is at higher than average risk for developing diabetes due to obesity and insulin resistance.  Assessment/Plan:   1. Insulin resistance Lauren Garza will continue to work on weight loss, exercise, and decreasing simple carbohydrates to help decrease the risk of diabetes. Lauren Garza agreed to follow-up with [place] as directed to closely monitor her progress. Check labs today.  - Comprehensive metabolic panel - Insulin, random  2. Insomnia, unspecified type The problem of recurrent insomnia was discussed. Orders and follow up as documented in patient record. Counseling: Intensive lifestyle modifications are the first line treatment for this issue. We discussed several lifestyle modifications today and she will continue to work on diet, exercise and weight loss efforts.   Counseling  Limit or avoid alcohol, caffeinated beverages, and cigarettes, especially close to bedtime.    Do not eat a large meal or eat spicy foods right before bedtime. This can lead to digestive discomfort that can make it hard for you to sleep.  Keep a sleep diary to help you and your health care provider figure out what could be causing your insomnia.  . Make your bedroom a dark, comfortable place where it is easy to fall asleep. ? Put up shades or blackout curtains to block light from outside. ? Use a white noise machine to block noise. ? Keep the temperature cool. . Limit screen use before bedtime. This includes: ? Watching TV. ? Using your smartphone, tablet, or computer. . Stick to a routine that includes going to bed and waking up at the same times every day and night. This can help you fall asleep faster. Consider making a quiet activity, such as reading, part of your nighttime routine. . Try to avoid taking naps during the day so that you sleep better at night. . Get out of bed if you are still awake after 15 minutes of trying to sleep. Keep the lights down, but try reading or doing a quiet activity. When you feel sleepy, go back to bed.  3. At risk for diabetes mellitus Lauren Garza was given approximately 15 minutes of diabetes education and counseling today. We discussed intensive lifestyle modifications today with an emphasis on weight loss as well as increasing exercise and decreasing simple carbohydrates in her diet. We also reviewed medication options with an emphasis on risk versus benefit of those discussed.   Repetitive spaced learning was employed today to elicit superior memory formation and behavioral change.  4. Class  2 severe obesity with serious comorbidity and body mass index (BMI) of 37.0 to 37.9 in adult, unspecified obesity type (Lauren Garza) Lauren Garza is currently in the action stage of change. As such, her goal is to continue with weight loss efforts. She has agreed to the Category 2 Plan.   Exercise goals: As is  Behavioral modification strategies: increasing lean  protein intake, increasing water intake, meal planning and cooking strategies and planning for success.  Lauren Garza has agreed to follow-up with our clinic in 2 weeks. She was informed of the importance of frequent follow-up visits to maximize her success with intensive lifestyle modifications for her multiple health conditions.   Lauren Garza was informed we would discuss her lab results at her next visit unless there is a critical issue that needs to be addressed sooner. Lauren Garza agreed to keep her next visit at the agreed upon time to discuss these results.  Objective:   Blood pressure 106/76, pulse 84, temperature 98.7 F (37.1 C), height 5\' 3"  (1.6 m), weight 213 lb (96.6 kg), last menstrual period 02/11/2018, SpO2 100 %. Body mass index is 37.73 kg/m.  General: Cooperative, alert, well developed, in no acute distress. HEENT: Conjunctivae and lids unremarkable. Cardiovascular: Regular rhythm.  Lungs: Normal work of breathing. Neurologic: No focal deficits.   Lab Results  Component Value Date   CREATININE 0.86 01/27/2021   BUN 15 01/27/2021   NA 141 01/27/2021   K 4.5 01/27/2021   CL 102 01/27/2021   CO2 21 01/27/2021   Lab Results  Component Value Date   ALT 33 (H) 01/27/2021   AST 34 01/27/2021   ALKPHOS 101 01/27/2021   BILITOT 0.5 01/27/2021   Lab Results  Component Value Date   HGBA1C 5.3 01/27/2021   HGBA1C 5.4 09/26/2020   HGBA1C 5.4 03/19/2020   Lab Results  Component Value Date   INSULIN 8.1 01/27/2021   INSULIN 8.2 09/26/2020   INSULIN 8.7 06/19/2020   INSULIN 9.9 03/19/2020   Lab Results  Component Value Date   TSH 1.360 03/19/2020   Lab Results  Component Value Date   CHOL 165 09/26/2020   HDL 54 09/26/2020   LDLCALC 97 09/26/2020   TRIG 74 09/26/2020   CHOLHDL 3.1 09/26/2020   Lab Results  Component Value Date   WBC 9.1 11/07/2020   HGB 13.2 11/07/2020   HCT 41.9 11/07/2020   MCV 84.5 11/07/2020   PLT 311 11/07/2020    Attestation  Statements:   Reviewed by clinician on day of visit: allergies, medications, problem list, medical history, surgical history, family history, social history, and previous encounter notes.   Coral Ceo, am acting as Location manager for Mina Marble, NP.  I have reviewed the above documentation for accuracy and completeness, and I agree with the above. -  Annalisa Colonna d. Shivaun Bilello, NP-C

## 2021-02-11 ENCOUNTER — Encounter (INDEPENDENT_AMBULATORY_CARE_PROVIDER_SITE_OTHER): Payer: Self-pay | Admitting: Adult Health

## 2021-02-11 ENCOUNTER — Ambulatory Visit (INDEPENDENT_AMBULATORY_CARE_PROVIDER_SITE_OTHER): Payer: 59 | Admitting: Adult Health

## 2021-02-11 ENCOUNTER — Other Ambulatory Visit: Payer: Self-pay

## 2021-02-11 VITALS — BP 126/70 | HR 78 | Temp 98.3°F | Ht 63.0 in | Wt 211.0 lb

## 2021-02-11 DIAGNOSIS — R7401 Elevation of levels of liver transaminase levels: Secondary | ICD-10-CM | POA: Diagnosis not present

## 2021-02-11 DIAGNOSIS — E8881 Metabolic syndrome: Secondary | ICD-10-CM

## 2021-02-11 DIAGNOSIS — Z6837 Body mass index (BMI) 37.0-37.9, adult: Secondary | ICD-10-CM

## 2021-02-12 NOTE — Progress Notes (Signed)
Chief Complaint:   OBESITY Lauren Garza is here to discuss her progress with her obesity treatment plan along with follow-up of her obesity related diagnoses. Lauren Garza is on the Category 2 Plan and states she is following her eating plan approximately 65% of the time. Lauren Garza states she is stretching and bowling 10 minutes 2 times per week.  Today's visit was #: 21 Starting weight: 237 lbs Starting date: 03/19/2020 Today's weight: 211 lbs Today's date: 02/11/2021 Total lbs lost to date: 26 lbs Total lbs lost since last in-office visit: 2 lbs  Interim History: Lauren Garza has resumed B12 injections bi-monthly and reports increase in energy level.  Interval goal: 1- continue with stretching and weekend bowling 2- increase water intake at least 72 oz/day.  Subjective:   1. Insulin resistance Discussed labs with patient today. 01/27/2021 A1c 5.3 with a blood glucose within normal limits at 81. Pt's insulin level 8.1 and has been in the 8's at last 2 checks.  2. Transaminitis Discussed labs with patient today. 01/27/2021 CMP resulted ALT very mildly elevated at 33. It was normal at last 2 checks. She denies jaundice or RUQ pain. She reports increased ETOH use the weekend prior to lab draw.   Assessment/Plan:   1. Insulin resistance Lauren Garza will continue to work on weight loss, exercise, and decreasing simple carbohydrates to help decrease the risk of diabetes. Lauren Garza agreed to follow-up with [place] as directed to closely monitor her progress. Continue to increase protein.  2. Transaminitis Monitor labs and limit hepatotoxic substances.  3. Class 2 severe obesity with serious comorbidity and body mass index (BMI) of 37.0 to 37.9 in adult, unspecified obesity type (HCC) Lauren Garza is currently in the action stage of change. As such, her goal is to continue with weight loss efforts. She has agreed to the Category 2 Plan.   Exercise goals: As is  Behavioral modification strategies:  increasing lean protein intake, decreasing simple carbohydrates, increasing water intake, meal planning and cooking strategies and planning for success.  Lauren Garza has agreed to follow-up with our clinic in 2 weeks. She was informed of the importance of frequent follow-up visits to maximize her success with intensive lifestyle modifications for her multiple health conditions.   Objective:   Blood pressure 126/70, pulse 78, temperature 98.3 F (36.8 C), height 5\' 3"  (1.6 m), weight 211 lb (95.7 kg), last menstrual period 02/11/2018, SpO2 100 %. Body mass index is 37.38 kg/m.  General: Cooperative, alert, well developed, in no acute distress. HEENT: Conjunctivae and lids unremarkable. Cardiovascular: Regular rhythm.  Lungs: Normal work of breathing. Neurologic: No focal deficits.   Lab Results  Component Value Date   CREATININE 0.86 01/27/2021   BUN 15 01/27/2021   NA 141 01/27/2021   K 4.5 01/27/2021   CL 102 01/27/2021   CO2 21 01/27/2021   Lab Results  Component Value Date   ALT 33 (H) 01/27/2021   AST 34 01/27/2021   ALKPHOS 101 01/27/2021   BILITOT 0.5 01/27/2021   Lab Results  Component Value Date   HGBA1C 5.3 01/27/2021   HGBA1C 5.4 09/26/2020   HGBA1C 5.4 03/19/2020   Lab Results  Component Value Date   INSULIN 8.1 01/27/2021   INSULIN 8.2 09/26/2020   INSULIN 8.7 06/19/2020   INSULIN 9.9 03/19/2020   Lab Results  Component Value Date   TSH 1.360 03/19/2020   Lab Results  Component Value Date   CHOL 165 09/26/2020   HDL 54 09/26/2020   LDLCALC 97 09/26/2020  TRIG 74 09/26/2020   CHOLHDL 3.1 09/26/2020   Lab Results  Component Value Date   WBC 9.1 11/07/2020   HGB 13.2 11/07/2020   HCT 41.9 11/07/2020   MCV 84.5 11/07/2020   PLT 311 11/07/2020    Attestation Statements:   Reviewed by clinician on day of visit: allergies, medications, problem list, medical history, surgical history, family history, social history, and previous encounter  notes.  Time spent on visit including pre-visit chart review and post-visit care and charting was 31 minutes.   Coral Ceo, am acting as Location manager for Mina Marble, NP.  I have reviewed the above documentation for accuracy and completeness, and I agree with the above. -  Chaniah Cisse d. Jaxin Fulfer, NP-C

## 2021-02-25 ENCOUNTER — Encounter (INDEPENDENT_AMBULATORY_CARE_PROVIDER_SITE_OTHER): Payer: Self-pay | Admitting: Adult Health

## 2021-02-25 ENCOUNTER — Other Ambulatory Visit: Payer: Self-pay

## 2021-02-25 ENCOUNTER — Ambulatory Visit (INDEPENDENT_AMBULATORY_CARE_PROVIDER_SITE_OTHER): Payer: 59 | Admitting: Adult Health

## 2021-02-25 VITALS — BP 119/79 | HR 58 | Temp 99.0°F | Ht 63.0 in | Wt 212.0 lb

## 2021-02-25 DIAGNOSIS — Z6837 Body mass index (BMI) 37.0-37.9, adult: Secondary | ICD-10-CM | POA: Diagnosis not present

## 2021-02-25 DIAGNOSIS — E8881 Metabolic syndrome: Secondary | ICD-10-CM

## 2021-02-25 DIAGNOSIS — J329 Chronic sinusitis, unspecified: Secondary | ICD-10-CM | POA: Diagnosis not present

## 2021-02-26 NOTE — Progress Notes (Signed)
Chief Complaint:   OBESITY Aerial is here to discuss her progress with her obesity treatment plan along with follow-up of her obesity related diagnoses. Julitza is on the Category 2 Plan and states she is following her eating plan approximately 70% of the time. Jaidin states she is doing 0 minutes 0 times per week.  Today's visit was #: 51 Starting weight: 237 lbs Starting date: 03/19/2020 Today's weight: 212 lbs Today's date: 02/25/2021 Total lbs lost to date: 25 lbs Total lbs lost since last in-office visit: 0  Interim History: Jahnay has been on category 2 meal plan the entirety of the program. Her weight loss has stalled the last few visits. We discussed other eating plans to help "re-set".  Subjective:   1. Insulin resistance Cheynne's 01/27/2021 insulin level was above goal at 8.1, with normal BG 81 and A1c 5.3. She has experienced cravings.  Lab Results  Component Value Date   INSULIN 8.1 01/27/2021   INSULIN 8.2 09/26/2020   INSULIN 8.7 06/19/2020   INSULIN 9.9 03/19/2020   Lab Results  Component Value Date   HGBA1C 5.3 01/27/2021    2. Chronic sinusitis, unspecified location [facility] has re-started a course of Amoxicillin 875 mg BID for 10 days.  She is looking for a new ENT.  Assessment/Plan:   1. Insulin resistance Kadi will continue to work on weight loss, exercise, and decreasing simple carbohydrates to help decrease the risk of diabetes. Tinzley agreed to follow-up with [place] as directed to closely monitor her progress. Follow the low carb plan for the next two weeks.  2. Chronic sinusitis, unspecified location Complete abx course. Establish with a new ENT.  3. Class 2 severe obesity with serious comorbidity and body mass index (BMI) of 37.0 to 37.9 in adult, unspecified obesity type (HCC) Aleanna is currently in the action stage of change. As such, her goal is to continue with weight loss efforts. She has agreed to following a  lower carbohydrate, vegetable and lean protein rich diet plan.   Exercise goals: As is- stretching and bowling  Behavioral modification strategies: increasing lean protein intake, decreasing simple carbohydrates, increasing water intake and planning for success.  Nabila has agreed to follow-up with our clinic in 2 weeks. She was informed of the importance of frequent follow-up visits to maximize her success with intensive lifestyle modifications for her multiple health conditions.   Objective:   Blood pressure 119/79, pulse (!) 58, temperature 99 F (37.2 C), height 5\' 3"  (1.6 m), weight 212 lb (96.2 kg), last menstrual period 02/11/2018, SpO2 100 %. Body mass index is 37.55 kg/m.  General: Cooperative, alert, well developed, in no acute distress. HEENT: Conjunctivae and lids unremarkable. Cardiovascular: Regular rhythm.  Lungs: Normal work of breathing. Neurologic: No focal deficits.   Lab Results  Component Value Date   CREATININE 0.86 01/27/2021   BUN 15 01/27/2021   NA 141 01/27/2021   K 4.5 01/27/2021   CL 102 01/27/2021   CO2 21 01/27/2021   Lab Results  Component Value Date   ALT 33 (H) 01/27/2021   AST 34 01/27/2021   ALKPHOS 101 01/27/2021   BILITOT 0.5 01/27/2021   Lab Results  Component Value Date   HGBA1C 5.3 01/27/2021   HGBA1C 5.4 09/26/2020   HGBA1C 5.4 03/19/2020   Lab Results  Component Value Date   INSULIN 8.1 01/27/2021   INSULIN 8.2 09/26/2020   INSULIN 8.7 06/19/2020   INSULIN 9.9 03/19/2020   Lab Results  Component Value Date   TSH 1.360 03/19/2020   Lab Results  Component Value Date   CHOL 165 09/26/2020   HDL 54 09/26/2020   LDLCALC 97 09/26/2020   TRIG 74 09/26/2020   CHOLHDL 3.1 09/26/2020   Lab Results  Component Value Date   WBC 9.1 11/07/2020   HGB 13.2 11/07/2020   HCT 41.9 11/07/2020   MCV 84.5 11/07/2020   PLT 311 11/07/2020    Attestation Statements:   Reviewed by clinician on day of visit: allergies,  medications, problem list, medical history, surgical history, family history, social history, and previous encounter notes.  Time spent on visit including pre-visit chart review and post-visit care and charting was 32 minutes.   Coral Ceo, am acting as Location manager for Mina Marble, NP.  I have reviewed the above documentation for accuracy and completeness, and I agree with the above. -  Timmi Devora d. Vian Fluegel. NP-C

## 2021-03-12 ENCOUNTER — Encounter (INDEPENDENT_AMBULATORY_CARE_PROVIDER_SITE_OTHER): Payer: Self-pay | Admitting: Adult Health

## 2021-03-12 ENCOUNTER — Other Ambulatory Visit: Payer: Self-pay

## 2021-03-12 ENCOUNTER — Ambulatory Visit (INDEPENDENT_AMBULATORY_CARE_PROVIDER_SITE_OTHER): Payer: 59 | Admitting: Adult Health

## 2021-03-12 VITALS — BP 105/69 | HR 82 | Temp 98.7°F | Ht 63.0 in | Wt 211.0 lb

## 2021-03-12 DIAGNOSIS — J329 Chronic sinusitis, unspecified: Secondary | ICD-10-CM

## 2021-03-12 DIAGNOSIS — Z9189 Other specified personal risk factors, not elsewhere classified: Secondary | ICD-10-CM | POA: Diagnosis not present

## 2021-03-12 DIAGNOSIS — Z6837 Body mass index (BMI) 37.0-37.9, adult: Secondary | ICD-10-CM | POA: Diagnosis not present

## 2021-03-12 DIAGNOSIS — E8881 Metabolic syndrome: Secondary | ICD-10-CM

## 2021-03-12 MED ORDER — METFORMIN HCL 500 MG PO TABS: ORAL_TABLET | ORAL | 0 refills | Status: DC

## 2021-03-13 NOTE — Progress Notes (Signed)
Chief Complaint:   OBESITY Lauren Garza is here to discuss her progress with her obesity treatment plan along with follow-up of her obesity related diagnoses. Lauren Garza is on following a lower carbohydrate, vegetable and lean protein rich diet plan and states she is following her eating plan approximately 50% of the time. Lauren Garza states she is stretching 10 minutes 2 times per week.  Today's visit was #: 23 Starting weight: 237 lbs Starting date: 03/19/2020 Today's weight: 211 lbs Today's date: 03/12/2021 Total lbs lost to date: 26 lbs Total lbs lost since last in-office visit: 1  Interim History: Lauren Garza reports low carb plan was a challenge due to limited nature of plan.  She missed her daily sandwich and morning bowl of high protein Special K.  Subjective:   1. Insulin resistance 01/27/2021 insulin level 8.1 with normal BG of 81 and normal A1c 5.3. Lauren Garza is unable to consistently follow low carb plan. 01/27/2021 CMP GFR 89. She continue to experience carb cravings.  Lab Results  Component Value Date   INSULIN 8.1 01/27/2021   INSULIN 8.2 09/26/2020   INSULIN 8.7 06/19/2020   INSULIN 9.9 03/19/2020   Lab Results  Component Value Date   HGBA1C 5.3 01/27/2021    2. Chronic sinusitis, unspecified location Lauren Garza completed last course of abx and denies current symptoms. She estimates to experience sinusitis every 4-6 weeks.  3. At risk for diarrhea Lauren Garza is at higher risk of diarrhea due to starting Metformin for insulin resistance.  Assessment/Plan:   1. Insulin resistance Lauren Garza will continue to work on weight loss, exercise, and decreasing simple carbohydrates to help decrease the risk of diabetes. Lauren Garza agreed to follow-up with [place] as directed to closely monitor her progress. Start Metformin 500 mg, as prescribed below.  - metFORMIN (GLUCOPHAGE) 500 MG tablet; 1/2 tab with breakfast daily  Dispense: 15 tablet; Refill: 0  2. Chronic sinusitis,  unspecified location Refer to ENT for assessment and evaluation.  - Ambulatory referral to ENT  3. At risk for diarrhea Lauren Garza was given approximately 15 minutes of diarrhea prevention counseling today. She is 40 y.o. female and has risk factors for diarrhea including medications and changes in diet. We discussed intensive lifestyle modifications today with an emphasis on specific weight loss instructions including dietary strategies.   Repetitive spaced learning was employed today to elicit superior memory formation and behavioral change.  4. Class 2 severe obesity with serious comorbidity and body mass index (BMI) of 37.0 to 37.9 in adult, unspecified obesity type (HCC) Lauren Garza is currently in the action stage of change. As such, her goal is to continue with weight loss efforts. She has agreed to the Category 2 Plan.   Provided cal/protein guidelines for each meal on category 2. Handouts: Recipes Guide and Eating Out Guide  Exercise goals: As is  Behavioral modification strategies: increasing lean protein intake, meal planning and cooking strategies and planning for success.  Lauren Garza has agreed to follow-up with our clinic in 3 weeks. She was informed of the importance of frequent follow-up visits to maximize her success with intensive lifestyle modifications for her multiple health conditions.   Objective:   Blood pressure 105/69, pulse 82, temperature 98.7 F (37.1 C), height 5\' 3"  (1.6 m), weight 211 lb (95.7 kg), last menstrual period 02/11/2018, SpO2 98 %. Body mass index is 37.38 kg/m.  General: Cooperative, alert, well developed, in no acute distress. HEENT: Conjunctivae and lids unremarkable. Cardiovascular: Regular rhythm.  Lungs: Normal work of breathing. Neurologic: No  focal deficits.   Lab Results  Component Value Date   CREATININE 0.86 01/27/2021   BUN 15 01/27/2021   NA 141 01/27/2021   K 4.5 01/27/2021   CL 102 01/27/2021   CO2 21 01/27/2021   Lab  Results  Component Value Date   ALT 33 (H) 01/27/2021   AST 34 01/27/2021   ALKPHOS 101 01/27/2021   BILITOT 0.5 01/27/2021   Lab Results  Component Value Date   HGBA1C 5.3 01/27/2021   HGBA1C 5.4 09/26/2020   HGBA1C 5.4 03/19/2020   Lab Results  Component Value Date   INSULIN 8.1 01/27/2021   INSULIN 8.2 09/26/2020   INSULIN 8.7 06/19/2020   INSULIN 9.9 03/19/2020   Lab Results  Component Value Date   TSH 1.360 03/19/2020   Lab Results  Component Value Date   CHOL 165 09/26/2020   HDL 54 09/26/2020   LDLCALC 97 09/26/2020   TRIG 74 09/26/2020   CHOLHDL 3.1 09/26/2020   Lab Results  Component Value Date   WBC 9.1 11/07/2020   HGB 13.2 11/07/2020   HCT 41.9 11/07/2020   MCV 84.5 11/07/2020   PLT 311 11/07/2020    Attestation Statements:   Reviewed by clinician on day of visit: allergies, medications, problem list, medical history, surgical history, family history, social history, and previous encounter notes.   Coral Ceo, am acting as Location manager for Mina Marble, NP.  I have reviewed the above documentation for accuracy and completeness, and I agree with the above. -  Jakobee Brackins d. Morgin Halls, NP-C

## 2021-03-21 ENCOUNTER — Telehealth: Payer: Self-pay | Admitting: Physical Medicine and Rehabilitation

## 2021-03-21 NOTE — Telephone Encounter (Signed)
Pt last inj was 09/02/20 okay to repeat?

## 2021-03-21 NOTE — Telephone Encounter (Signed)
Patient called needing to schedule an appointment with Dr Ernestina Patches for her hip. The number to contact patient is [phone]

## 2021-03-26 NOTE — Telephone Encounter (Signed)
Pt called and sch 4/28

## 2021-03-27 ENCOUNTER — Ambulatory Visit (INDEPENDENT_AMBULATORY_CARE_PROVIDER_SITE_OTHER): Payer: 59 | Admitting: Physical Medicine and Rehabilitation

## 2021-03-27 ENCOUNTER — Other Ambulatory Visit: Payer: Self-pay

## 2021-03-27 ENCOUNTER — Ambulatory Visit: Payer: Self-pay

## 2021-03-27 DIAGNOSIS — M7062 Trochanteric bursitis, left hip: Secondary | ICD-10-CM | POA: Diagnosis not present

## 2021-03-27 DIAGNOSIS — M7061 Trochanteric bursitis, right hip: Secondary | ICD-10-CM | POA: Diagnosis not present

## 2021-03-27 NOTE — Progress Notes (Signed)
Pt state in both outer thighs. Pt state sitting and laying in bed makes the pain worse. Pt state she doesn't take anything for the pain.  Numeric Pain Rating Scale and Functional Assessment Average Pain 8   In the last MONTH (on 0-10 scale) has pain interfered with the following?  1. General activity like being  able to carry out your everyday physical activities such as walking, climbing stairs, carrying groceries, or moving a chair?  Rating(8)    -BT, -Dye Allergies.

## 2021-04-01 ENCOUNTER — Encounter (INDEPENDENT_AMBULATORY_CARE_PROVIDER_SITE_OTHER): Payer: Self-pay | Admitting: Family Medicine

## 2021-04-01 ENCOUNTER — Ambulatory Visit (INDEPENDENT_AMBULATORY_CARE_PROVIDER_SITE_OTHER): Payer: 59 | Admitting: Family Medicine

## 2021-04-01 ENCOUNTER — Other Ambulatory Visit: Payer: Self-pay

## 2021-04-01 VITALS — BP 113/78 | HR 78 | Temp 98.9°F | Ht 63.0 in | Wt 217.0 lb

## 2021-04-01 DIAGNOSIS — Z6841 Body Mass Index (BMI) 40.0 and over, adult: Secondary | ICD-10-CM | POA: Diagnosis not present

## 2021-04-01 DIAGNOSIS — E88819 Insulin resistance, unspecified: Secondary | ICD-10-CM

## 2021-04-01 DIAGNOSIS — E8881 Metabolic syndrome: Secondary | ICD-10-CM | POA: Diagnosis not present

## 2021-04-01 DIAGNOSIS — Z9189 Other specified personal risk factors, not elsewhere classified: Secondary | ICD-10-CM | POA: Diagnosis not present

## 2021-04-01 DIAGNOSIS — E66813 Obesity, class 3: Secondary | ICD-10-CM

## 2021-04-01 MED ORDER — OZEMPIC (0.25 OR 0.5 MG/DOSE) 2 MG/1.5ML ~~LOC~~ SOPN: PEN_INJECTOR | SUBCUTANEOUS | 0 refills | Status: DC

## 2021-04-07 ENCOUNTER — Other Ambulatory Visit (INDEPENDENT_AMBULATORY_CARE_PROVIDER_SITE_OTHER): Payer: Self-pay

## 2021-04-07 ENCOUNTER — Ambulatory Visit (INDEPENDENT_AMBULATORY_CARE_PROVIDER_SITE_OTHER): Payer: 59 | Admitting: Otolaryngology

## 2021-04-07 ENCOUNTER — Encounter (INDEPENDENT_AMBULATORY_CARE_PROVIDER_SITE_OTHER): Payer: Self-pay | Admitting: Otolaryngology

## 2021-04-07 ENCOUNTER — Other Ambulatory Visit: Payer: Self-pay

## 2021-04-07 VITALS — Temp 97.5°F

## 2021-04-07 DIAGNOSIS — J310 Chronic rhinitis: Secondary | ICD-10-CM | POA: Diagnosis not present

## 2021-04-07 DIAGNOSIS — J329 Chronic sinusitis, unspecified: Secondary | ICD-10-CM

## 2021-04-07 NOTE — Progress Notes (Signed)
HPI: Lauren Garza is a 40 y.o. female who presents is referred by Mina Marble, NP for evaluation of bad sinus infection.  She states that she sometimes will blow out green mucus and has an odor in her nose.  She has recently been on Cipro for urinary tract infection.  She is still completing the Cipro. She describes pain pressure between her eyebrows. She has had previous sinus surgery with Dr. Redmond Baseman x2. She also has history of allergy and asthma and takes Flonase regularly..  Past Medical History:  Diagnosis Date  . Abnormal Pap smear   . Acid reflux   . Allergies   . Asthma   . Bacterial vaginosis   . Bursitis   . Chlamydia trachomatis infection 2012  . Constipation   . Depression   . DUB (dysfunctional uterine bleeding)   . Family history of adverse reaction to anesthesia    mom has n and V past surgery  . Frequent headaches    migraines  . Hypertension    "not anymore", taken off BP meds per pt report  . Meralgia paresthetica   . Plantar fasciitis    right    Past Surgical History:  Procedure Laterality Date  . GYNECOLOGIC CRYOSURGERY    . I & D EXTREMITY Right 04/04/2015   Procedure: IRRIGATION AND DEBRIDEMENT RIGHT WRIST;  Surgeon: Leanora Cover, MD;  Location: [facility];  Service: Orthopedics;  Laterality: Right;  . SINUS ENDO WITH FUSION N/A 01/25/2017   Procedure: SINUS ENDO WITH FUSION;  Surgeon: Melida Quitter, MD;  Location: [facility];  Service: ENT;  Laterality: N/A;  . TUBAL LIGATION    . VAGINAL HYSTERECTOMY Bilateral 02/01/2018   Procedure: HYSTERECTOMY VAGINAL WITH SALPINGECTOMY;  Surgeon: Chancy Milroy, MD;  Location: [facility];  Service: Gynecology;  Laterality: Bilateral;  . WISDOM TOOTH EXTRACTION    . WRIST SURGERY     Social History   Socioeconomic History  . Marital status: Divorced    Spouse name: Not on file  . Number of children: 3  . Years of education: Not on file  . Highest education level: 11th grade  Occupational History  .  Occupation: Pensions consultant  Tobacco Use  . Smoking status: Former Research scientist (life sciences)  . Smokeless tobacco: Never Used  . Tobacco comment: rare smoker 9 years ago  Vaping Use  . Vaping Use: Never used  Substance and Sexual Activity  . Alcohol use: Yes    Comment: Rare, 1x year  . Drug use: No  . Sexual activity: Yes    Birth control/protection: Surgical  Other Topics Concern  . Not on file  Social History Narrative   Lives at home with her children and parents    Right handed   Caffeine: coffee, 1 cup/day   Social Determinants of Health   Financial Resource Strain: Not on file  Food Insecurity: Not on file  Transportation Needs: Not on file  Physical Activity: Not on file  Stress: Not on file  Social Connections: Not on file   Family History  Problem Relation Age of Onset  . Heart attack Father   . Hypertension Mother   . Diabetes Mother   . Other Mother        questionable nerve issue, has numbness in thighs too  . Sleep apnea Mother   . Obesity Mother   . Diabetes Maternal Grandmother    Allergies  Allergen Reactions  . Clindamycin/Lincomycin Hives  . Dust Mite Extract  Unknown reaction  . Flagyl [Metronidazole Hcl] Hives  . Lincomycin Hcl Hives  . Metformin And Related    Prior to Admission medications   Medication Sig Start Date End Date Taking? Authorizing Provider  albuterol (ACCUNEB) 0.63 MG/3ML nebulizer solution Take by nebulization.    [provider]  Boric Acid Vaginal 600 MG SUPP INSERT 1 SUPPOSITORY VAGINALLY AT BEDTIME. FOR VAGINAL USE 01/24/21   [provider]  cetirizine (ZYRTEC) 10 MG tablet Take 10 mg by mouth daily. 03/17/21   [provider]  cyanocobalamin (,VITAMIN B-12,) 1000 MCG/ML injection Inject 1,000 mcg into the skin every 30 (thirty) days. 01/28/21   [provider]  fluticasone (FLONASE) 50 MCG/ACT nasal spray Place 1 spray into both nostrils daily. 03/17/21   [provider]  montelukast (SINGULAIR)  10 MG tablet SMARTSIG:1 Tablet(s) By Mouth Every Evening 01/01/21   [provider]  omeprazole (PRILOSEC) 40 MG capsule Take 1 tablet by mouth daily. 08/06/20   [provider]  Semaglutide,0.25 or 0.5MG /DOS, (OZEMPIC, 0.25 OR 0.5 MG/DOSE,) 2 MG/1.5ML SOPN Inject 0.25 mg into the skin once a week for 14 days, THEN 0.5 mg once a week for 14 days. 04/01/21 04/29/21  Mellody Dance, DO  SYMBICORT 80-4.5 MCG/ACT inhaler SMARTSIG:2 Puff(s) By Mouth Twice Daily 01/14/21   [provider]  valACYclovir (VALTREX) 1000 MG tablet Take 1,000 mg by mouth 3 (three) times daily. 03/24/21   [provider]     Positive ROS: Otherwise negative  All other systems have been reviewed and were otherwise negative with the exception of those mentioned in the HPI and as above.  Physical Exam: Constitutional: Alert, well-appearing, no acute distress Ears: External ears without lesions or tenderness. Ear canals are clear bilaterally with intact, clear TMs.  Nasal: External nose without lesions. Septum midline.  Both middle meatus regions are widely patent and no obvious mucopurulent discharge noted..  A culture was obtained from the middle meatus bilaterally and sent to lab. Oral: Lips and gums without lesions. Tongue and palate mucosa without lesions. Posterior oropharynx clear.  No obvious mucopurulent discharge noted in the posterior oropharynx. Neck: No palpable adenopathy or masses Respiratory: Breathing comfortably  Skin: No facial/neck lesions or rash noted.  Procedures  Assessment: History of chronic sinus issues and recurrent sinus infections. Patient has had previous surgery and the middle meatus area appears clear today.  Plan: Recommended completing the remaining Cipro and discussed with her concerning using saline irrigations and suggested trying AYR brand or Xlear brand as she has used NeilMed in the past and does not feel like this helps. I obtained a culture from the  middle meatus in the office today and she will call us back in 3 days concerning results of the culture. Also gave her a prescription for Ceftin 500 mg twice daily for 10 days to take if she develops any yellow-green discharge. If symptoms do not resolve following completion of the Ceftin may need to obtain CT scan of the sinuses to see if she is having any blockage of her frontal sinuses.  As the ethmoid sinuses appear clear in the office today.   Radene Journey, MD   CC:

## 2021-04-09 NOTE — Progress Notes (Signed)
Chief Complaint:   OBESITY Lauren Garza is here to discuss her progress with her obesity treatment plan along with follow-up of her obesity related diagnoses.   Today's visit was #: 24 Starting weight: 237 lbs Starting date: 03/19/2020 Today's weight: 217 lbs Today's date: 04/01/2021 Weight change since last visit: +6 lbs Total lbs lost to date: 20 lbs Body mass index is 38.44 kg/m.  Total weight loss percentage to date: -8.44%  Interim History:  Lauren Garza says she has been having increased hunger and increased sweets cravings.  Failed metformin than Lauren Marble, NP, started her on 3-4 weeks ago as this caused a yeast infection.  For 55% of her day, she skipped meals or ate off plan a lot of the time.  Current Meal Plan: the Category 2 Plan for 45% of the time.  Current Exercise Plan: None.  Assessment/Plan:   No orders of the defined types were placed in this encounter.   Medications Discontinued During This Encounter  Medication Reason  . metFORMIN (GLUCOPHAGE) 500 MG tablet Discontinued by provider     Meds ordered this encounter  Medications  . Semaglutide,0.25 or 0.5MG /DOS, (OZEMPIC, 0.25 OR 0.5 MG/DOSE,) 2 MG/1.5ML SOPN    Sig: Inject 0.25 mg into the skin once a week for 14 days, THEN 0.5 mg once a week for 14 days.    Dispense:  1.5 mL    Refill:  0    1. Insulin resistance Not at goal. Goal is HgbA1c < 5.7, fasting insulin closer to 5.  Medication: None.    Plan:  Discontinue metformin and start Ozempic 0.25 mg subcutaneously weekly.  She will continue to focus on protein-rich, low simple carbohydrate foods. We reviewed the importance of hydration, regular exercise for stress reduction, and restorative sleep.   Lab Results  Component Value Date   HGBA1C 5.3 01/27/2021   Lab Results  Component Value Date   INSULIN 8.1 01/27/2021   INSULIN 8.2 09/26/2020   INSULIN 8.7 06/19/2020   INSULIN 9.9 03/19/2020   - Start Semaglutide,0.25 or 0.5MG /DOS, (OZEMPIC,  0.25 OR 0.5 MG/DOSE,) 2 MG/1.5ML SOPN; Inject 0.25 mg into the skin once a week for 14 days, THEN 0.5 mg once a week for 14 days.  Dispense: 1.5 mL; Refill: 0  2. At risk for side effect of medication Due to Lauren Garza's current conditions and medications, she is at a higher risk for drug side effect.  At least 9 minutes was spent on counseling her about these concerns today.  We discussed the benefits and potential risks of these medications, and all of patient's concerns were addressed and questions were answered.  she will call us, or their PCP or other specialists who treat their conditions with medications, with any questions or concerns that may develop.    3. Obesity, current BMI 38.6  Course: Lauren Garza is currently in the action stage of change. As such, her goal is to continue with weight loss efforts.   Nutrition goals: She has agreed to the Category 2 Plan.   Exercise goals: All adults should avoid inactivity. Some physical activity is better than none, and adults who participate in any amount of physical activity gain some health benefits.  Behavioral modification strategies: increasing lean protein intake, decreasing simple carbohydrates, meal planning and cooking strategies, avoiding temptations and planning for success.  Handouts on meal plan given.  Discussed inexpensive/cost saving ways to eat healthy.   Lauren Garza has agreed to follow-up with our clinic in 2-3 weeks. She was  informed of the importance of frequent follow-up visits to maximize her success with intensive lifestyle modifications for her multiple health conditions.   Objective:   Blood pressure 113/78, pulse 78, temperature 98.9 F (37.2 C), height 5\' 3"  (1.6 m), weight 217 lb (98.4 kg), last menstrual period 02/11/2018, SpO2 100 %. Body mass index is 38.44 kg/m.  General: Cooperative, alert, well developed, in no acute distress. HEENT: Conjunctivae and lids unremarkable. Cardiovascular: Regular rhythm.   Lungs: Normal work of breathing. Neurologic: No focal deficits.   Lab Results  Component Value Date   CREATININE 0.86 01/27/2021   BUN 15 01/27/2021   NA 141 01/27/2021   K 4.5 01/27/2021   CL 102 01/27/2021   CO2 21 01/27/2021   Lab Results  Component Value Date   ALT 33 (H) 01/27/2021   AST 34 01/27/2021   ALKPHOS 101 01/27/2021   BILITOT 0.5 01/27/2021   Lab Results  Component Value Date   HGBA1C 5.3 01/27/2021   HGBA1C 5.4 09/26/2020   HGBA1C 5.4 03/19/2020   Lab Results  Component Value Date   INSULIN 8.1 01/27/2021   INSULIN 8.2 09/26/2020   INSULIN 8.7 06/19/2020   INSULIN 9.9 03/19/2020   Lab Results  Component Value Date   TSH 1.360 03/19/2020   Lab Results  Component Value Date   CHOL 165 09/26/2020   HDL 54 09/26/2020   LDLCALC 97 09/26/2020   TRIG 74 09/26/2020   CHOLHDL 3.1 09/26/2020   Lab Results  Component Value Date   WBC 9.1 11/07/2020   HGB 13.2 11/07/2020   HCT 41.9 11/07/2020   MCV 84.5 11/07/2020   PLT 311 11/07/2020   Attestation Statements:   Reviewed by clinician on day of visit: allergies, medications, problem list, medical history, surgical history, family history, social history, and previous encounter notes.  I, Water quality scientist, CMA, am acting as Location manager for [employer], DO.  I have reviewed the above documentation for accuracy and completeness, and I agree with the above. Marjory Sneddon, D.O.  The [place] was signed into law in 2016 which includes the topic of electronic health records.  This provides immediate access to information in MyChart.  This includes consultation notes, operative notes, office notes, lab results and pathology reports.  If you have any questions about what you read please let us know at your next visit so we can discuss your concerns and take corrective action if need be.  We are right here with you.

## 2021-04-13 LAB — ANAEROBIC AND AEROBIC CULTURE

## 2021-04-14 ENCOUNTER — Other Ambulatory Visit (INDEPENDENT_AMBULATORY_CARE_PROVIDER_SITE_OTHER): Payer: Self-pay | Admitting: Adult Health

## 2021-04-14 DIAGNOSIS — E8881 Metabolic syndrome: Secondary | ICD-10-CM

## 2021-04-14 NOTE — Telephone Encounter (Signed)
Dr.Opalski ?

## 2021-04-21 MED ORDER — BUPIVACAINE HCL 0.25 % IJ SOLN
6.0000 mL | INTRAMUSCULAR | Status: AC | PRN
  Administered 2021-03-27: 6 mL via INTRA_ARTICULAR

## 2021-04-21 MED ORDER — TRIAMCINOLONE ACETONIDE 40 MG/ML IJ SUSP
40.0000 mg | INTRAMUSCULAR | Status: AC | PRN
  Administered 2021-03-27: 40 mg via INTRA_ARTICULAR

## 2021-04-21 NOTE — Progress Notes (Signed)
[name] - 40 y.o. female MRN [id number]  Date of birth: August 22, 1981  Office Visit Note: Visit Date: 03/27/2021 PCP: Lin Landsman, MD Referred by: Lin Landsman, MD  Subjective: Chief Complaint  Patient presents with  . Left Thigh - Pain  . Right Thigh - Pain   HPI:  Lauren Garza is a 40 y.o. female who comes in today For planned repeat bilateral greater trochanteric injections with fluoroscopic guidance.  Patient has done extremely well in the past with 2 prior greater trochanteric bursa injections with fluoroscopic guidance.  Fluoroscopic guidance used because of body habitus has been actually the difference in her pain relief.  She has had prior injection in March 2021 in October 2021 both with almost 100% relief for up to 6 to 7 months.  When I originally saw her she was diagnosed with meralgia paresthetica.  She clearly has pain over the greater trochanters no groin pain no radicular symptoms no other red flag symptoms.  She has had increasing symptoms over the last several months despite continued medication management as well as exercise at home and stretching.  Because she is done so well in the past with such great relief we are going to repeat the injection today diagnostically and therapeutically.  ROS Otherwise per HPI.  Assessment & Plan: Visit Diagnoses:    ICD-10-CM   1. Trochanteric bursitis, left hip  M70.62 XR C-ARM NO REPORT  2. Trochanteric bursitis, right hip  M70.61 XR C-ARM NO REPORT    Plan: No additional findings.   Meds & Orders: No orders of the defined types were placed in this encounter.   Orders Placed This Encounter  Procedures  . Large Joint Inj  . XR C-ARM NO REPORT    Follow-up: Return if symptoms worsen or fail to improve.   Procedures: Large Joint Inj: bilateral greater trochanter on 03/27/2021 3:30 PM Indications: pain and diagnostic evaluation Details: 22 G 3.5 in needle, fluoroscopy-guided lateral approach  Arthrogram:  No  Medications (Right): 40 mg triamcinolone acetonide 40 MG/ML; 6 mL bupivacaine 0.25 % Medications (Left): 40 mg triamcinolone acetonide 40 MG/ML; 6 mL bupivacaine 0.25 % Outcome: tolerated well, no immediate complications  There was excellent flow of contrast outlined the greater trochanteric bursa without vascular uptake. Procedure, treatment alternatives, risks and benefits explained, specific risks discussed. Consent was given by the patient. Immediately prior to procedure a time out was called to verify the correct patient, procedure, equipment, support staff and site/side marked as required. Patient was prepped and draped in the usual sterile fashion.          Clinical History: MRI LUMBAR SPINE WITHOUT CONTRAST  TECHNIQUE: Multiplanar, multisequence MR imaging of the lumbar spine was performed. No intravenous contrast was administered.  COMPARISON:  None available.  FINDINGS: Segmentation: Standard. Lowest well-formed disc space labeled the L5-S1 level.  Alignment: Mild dextroscoliosis. Alignment otherwise normal preservation of the normal lumbar lordosis.  Vertebrae: Vertebral body height maintained without evidence for acute or chronic fracture. Bone marrow signal intensity within normal limits. No discrete or worrisome osseous lesions. No abnormal marrow edema.  Conus medullaris and cauda equina: Conus extends to the L1 level. Conus and cauda equina appear normal.  Paraspinal and other soft tissues: Paraspinous soft tissues within normal limits. Visualized visceral structures are normal.  Disc levels:  L1-2:  Unremarkable.  L2-3:  Unremarkable.  L3-4: Disc desiccation with minimal annular disc bulge. No stenosis or impingement.  L4-5: Normal interspace. Mild facet hypertrophy. No stenosis  or impingement.  L5-S1: Normal interspace. Mild facet hypertrophy. No stenosis or impingement.  IMPRESSION: 1. Mild degenerative disc disease at L3-4  without stenosis or impingement. 2. Mild facet hypertrophy at L4-5 and L5-S1. 3. Underlying mild dextroscoliosis.   Electronically Signed   By: Jeannine Boga M.D.   On: 02/24/2020 22:54     Objective:  VS:  HT:    WT:   BMI:     BP:   HR: bpm  TEMP: ( )  RESP:  Physical Exam Vitals and nursing note reviewed.  Constitutional:      General: She is not in acute distress.    Appearance: Normal appearance. She is obese. She is not ill-appearing.  HENT:     Head: Normocephalic and atraumatic.     Right Ear: External ear normal.     Left Ear: External ear normal.  Eyes:     Extraocular Movements: Extraocular movements intact.  Cardiovascular:     Rate and Rhythm: Normal rate.     Pulses: Normal pulses.  Pulmonary:     Effort: Pulmonary effort is normal. No respiratory distress.  Abdominal:     General: There is no distension.     Palpations: Abdomen is soft.  Musculoskeletal:        General: Tenderness present.     Cervical back: Neck supple.     Right lower leg: No edema.     Left lower leg: No edema.     Comments: Patient has good distal strength with concordant pain over the greater trochanters.  No clonus or focal weakness.  Skin:    Findings: No erythema, lesion or rash.  Neurological:     General: No focal deficit present.     Mental Status: She is alert and oriented to person, place, and time.     Sensory: No sensory deficit.     Motor: No weakness or abnormal muscle tone.     Coordination: Coordination normal.  Psychiatric:        Mood and Affect: Mood normal.        Behavior: Behavior normal.      Imaging: No results found.

## 2021-04-22 ENCOUNTER — Other Ambulatory Visit: Payer: Self-pay

## 2021-04-22 ENCOUNTER — Encounter (INDEPENDENT_AMBULATORY_CARE_PROVIDER_SITE_OTHER): Payer: Self-pay | Admitting: Family Medicine

## 2021-04-22 ENCOUNTER — Ambulatory Visit (INDEPENDENT_AMBULATORY_CARE_PROVIDER_SITE_OTHER): Payer: 59 | Admitting: Family Medicine

## 2021-04-22 VITALS — BP 113/75 | HR 75 | Temp 98.4°F | Ht 63.0 in | Wt 212.0 lb

## 2021-04-22 DIAGNOSIS — Z9189 Other specified personal risk factors, not elsewhere classified: Secondary | ICD-10-CM | POA: Diagnosis not present

## 2021-04-22 DIAGNOSIS — E8881 Metabolic syndrome: Secondary | ICD-10-CM | POA: Diagnosis not present

## 2021-04-22 DIAGNOSIS — Z6841 Body Mass Index (BMI) 40.0 and over, adult: Secondary | ICD-10-CM | POA: Diagnosis not present

## 2021-04-22 MED ORDER — OZEMPIC (0.25 OR 0.5 MG/DOSE) 2 MG/1.5ML ~~LOC~~ SOPN
PEN_INJECTOR | SUBCUTANEOUS | 0 refills | Status: DC
Start: 2021-04-22 — End: 2021-06-04

## 2021-04-30 NOTE — Progress Notes (Signed)
Chief Complaint:   OBESITY Lauren Garza is here to discuss her progress with her obesity treatment plan along with follow-up of her obesity related diagnoses.   Today's visit was #: 25 Starting weight: 237 lbs Starting date: 03/19/2020 Today's weight: 212 lbs Today's date: 04/22/2021 Weight change since last visit: 5 lbs Total lbs lost to date: 25 lbs Body mass index is 37.55 kg/m.  Total weight loss percentage to date: -10.55%  Interim History:  Lauren Garza increased her dose of Ozempic to 0.5 mg last week and had GI upset for 3 days with headache.  She was not eating on plan.  Current Meal Plan: the Category 2 Plan with breakfast options for 50% of the time.  Current Exercise Plan: None. Current Anti-Obesity Medications: Ozempic 0.5 mg subcutaneously weekly. Side effects: GI upset, headache x3 days.  Assessment/Plan:   Medications Discontinued During This Encounter  Medication Reason   Semaglutide,0.25 or 0.5MG /DOS, (OZEMPIC, 0.25 OR 0.5 MG/DOSE,) 2 MG/1.5ML SOPN Reorder    Meds ordered this encounter  Medications   Semaglutide,0.25 or 0.5MG /DOS, (OZEMPIC, 0.25 OR 0.5 MG/DOSE,) 2 MG/1.5ML SOPN    Sig: Inject 0.25 mg into the skin once a week for 14 days, THEN 0.5 mg once a week for 14 days.    Dispense:  1.5 mL    Refill:  0    1. Insulin resistance Not at goal. Goal is HgbA1c < 5.7, fasting insulin closer to 5.  Medication: Ozempic 0.5 mg subcutaneously weekly.  She had a good response with decrease in appetite and cravings.  Tolerating Ozempic okay but had some GI upset with 0.5 mg dose.  Plan:  Will refill Ozempic.  She will continue to focus on protein-rich, low simple carbohydrate foods. We reviewed the importance of hydration, regular exercise for stress reduction, and restorative sleep.   Lab Results  Component Value Date   HGBA1C 5.3 01/27/2021   Lab Results  Component Value Date   INSULIN 8.1 01/27/2021   INSULIN 8.2 09/26/2020   INSULIN 8.7 06/19/2020    INSULIN 9.9 03/19/2020   - Refill Semaglutide,0.25 or 0.5MG /DOS, (OZEMPIC, 0.25 OR 0.5 MG/DOSE,) 2 MG/1.5ML SOPN; Inject 0.25 mg into the skin once a week for 14 days, THEN 0.5 mg once a week for 14 days.  Dispense: 1.5 mL; Refill: 0  2. At risk for side effect of medication Due to Lauren Garza's current conditions and medications, she is at a higher risk for drug side effect.  At least 9 minutes was spent on counseling her about these concerns today.  We discussed the benefits and potential risks of these medications, and all of patient's concerns were addressed and questions were answered.  she will call us, or their PCP or other specialists who treat their conditions with medications, with any questions or concerns that may develop.    3. Obesity, current BMI 37.7  Course: Lauren Garza is currently in the action stage of change. As such, her goal is to continue with weight loss efforts.   Nutrition goals: She has agreed to- no change-  the Category 2 Plan with breakfast options.   Exercise goals:  Walking for 5-10 minutes daily.  Behavioral modification strategies: increasing lean protein intake, decreasing simple carbohydrates and planning for success.  Lauren Garza has agreed to follow-up with our clinic in 2-3 weeks. She was informed of the importance of frequent follow-up visits to maximize her success with intensive lifestyle modifications for her multiple health conditions.   Objective:   Blood pressure 113/75,  pulse 75, temperature 98.4 F (36.9 C), height 5\' 3"  (1.6 m), weight 212 lb (96.2 kg), last menstrual period 02/11/2018, SpO2 97 %. Body mass index is 37.55 kg/m.  General: Cooperative, alert, well developed, in no acute distress. HEENT: Conjunctivae and lids unremarkable. Cardiovascular: Regular rhythm.  Lungs: Normal work of breathing. Neurologic: No focal deficits.   Lab Results  Component Value Date   CREATININE 0.86 01/27/2021   BUN 15 01/27/2021   NA 141 01/27/2021    K 4.5 01/27/2021   CL 102 01/27/2021   CO2 21 01/27/2021   Lab Results  Component Value Date   ALT 33 (H) 01/27/2021   AST 34 01/27/2021   ALKPHOS 101 01/27/2021   BILITOT 0.5 01/27/2021   Lab Results  Component Value Date   HGBA1C 5.3 01/27/2021   HGBA1C 5.4 09/26/2020   HGBA1C 5.4 03/19/2020   Lab Results  Component Value Date   INSULIN 8.1 01/27/2021   INSULIN 8.2 09/26/2020   INSULIN 8.7 06/19/2020   INSULIN 9.9 03/19/2020   Lab Results  Component Value Date   TSH 1.360 03/19/2020   Lab Results  Component Value Date   CHOL 165 09/26/2020   HDL 54 09/26/2020   LDLCALC 97 09/26/2020   TRIG 74 09/26/2020   CHOLHDL 3.1 09/26/2020   Lab Results  Component Value Date   WBC 9.1 11/07/2020   HGB 13.2 11/07/2020   HCT 41.9 11/07/2020   MCV 84.5 11/07/2020   PLT 311 11/07/2020   Attestation Statements:   Reviewed by clinician on day of visit: allergies, medications, problem list, medical history, surgical history, family history, social history, and previous encounter notes.  I, Water quality scientist, CMA, am acting as Location manager for [employer], DO.  I have reviewed the above documentation for accuracy and completeness, and I agree with the above. -Marjory Sneddon, D.O.  The [place] was signed into law in 2016 which includes the topic of electronic health records.  This provides immediate access to information in MyChart.  This includes consultation notes, operative notes, office notes, lab results and pathology reports.  If you have any questions about what you read please let us know at your next visit so we can discuss your concerns and take corrective action if need be.  We are right here with you.

## 2021-05-01 ENCOUNTER — Other Ambulatory Visit (INDEPENDENT_AMBULATORY_CARE_PROVIDER_SITE_OTHER): Payer: Self-pay

## 2021-05-01 ENCOUNTER — Encounter (INDEPENDENT_AMBULATORY_CARE_PROVIDER_SITE_OTHER): Payer: Self-pay

## 2021-05-01 ENCOUNTER — Telehealth (INDEPENDENT_AMBULATORY_CARE_PROVIDER_SITE_OTHER): Payer: Self-pay

## 2021-05-01 MED ORDER — AMOXICILLIN-POT CLAVULANATE 875-125 MG PO TABS: 1.0000 | ORAL_TABLET | Freq: Two times a day (BID) | ORAL | 0 refills | Status: DC

## 2021-05-01 MED ORDER — CEFUROXIME AXETIL 500 MG PO TABS: 500.0000 mg | ORAL_TABLET | Freq: Two times a day (BID) | ORAL | 0 refills | Status: DC

## 2021-05-01 NOTE — Telephone Encounter (Signed)
Pt phoned in and stated that she needs new Rx for an antibiotic, what was given to her at office visit is very upsetting to her body. Per Dr. Lucia Gaskins I sent in Rx for Ceftin 500 mg BID x 1 week. Pt was called and informed.

## 2021-05-14 ENCOUNTER — Ambulatory Visit (INDEPENDENT_AMBULATORY_CARE_PROVIDER_SITE_OTHER): Payer: 59 | Admitting: Family Medicine

## 2021-05-14 ENCOUNTER — Other Ambulatory Visit: Payer: Self-pay

## 2021-05-14 ENCOUNTER — Encounter (INDEPENDENT_AMBULATORY_CARE_PROVIDER_SITE_OTHER): Payer: Self-pay | Admitting: Family Medicine

## 2021-05-14 VITALS — BP 107/67 | HR 68 | Temp 98.3°F | Ht 63.0 in | Wt 210.0 lb

## 2021-05-14 DIAGNOSIS — Z9189 Other specified personal risk factors, not elsewhere classified: Secondary | ICD-10-CM | POA: Diagnosis not present

## 2021-05-14 DIAGNOSIS — Z6841 Body Mass Index (BMI) 40.0 and over, adult: Secondary | ICD-10-CM

## 2021-05-14 DIAGNOSIS — E8881 Metabolic syndrome: Secondary | ICD-10-CM

## 2021-05-14 DIAGNOSIS — K219 Gastro-esophageal reflux disease without esophagitis: Secondary | ICD-10-CM

## 2021-05-14 MED ORDER — OMEPRAZOLE 20 MG PO CPDR: DELAYED_RELEASE_CAPSULE | ORAL | 5 refills | Status: DC

## 2021-05-26 NOTE — Progress Notes (Signed)
Chief Complaint:   OBESITY Lauren Garza is here to discuss her progress with her obesity treatment plan along with follow-up of her obesity related diagnoses.   Today's visit was #: 87 Starting weight: 237 lbs Starting date: 03/19/2020 Today's weight: 210 lbs Today's date: 05/14/2021 Weight change since last visit: 2 lbs Total lbs lost to date: 27 lbs Body mass index is 37.2 kg/m.  Total weight loss percentage to date: -11.39%  Interim History:  Lauren Garza is here for a follow up office visit and she is following the meal plan without concerns or issues.  Patient's meal and food recall appears to be accurate and consistent with what is on the plan.  When on plan, her hunger and cravings are well controlled.    Plan:  Lauren Garza was given handouts on recipes, Eating Out guide.  Current Meal Plan: the Category 2 Plan with breakfast options for 50% of the time.  Current Exercise Plan: None.  Assessment/Plan:   Medications Discontinued During This Encounter  Medication Reason   omeprazole (PRILOSEC) 40 MG capsule    Meds ordered this encounter  Medications   omeprazole (PRILOSEC) 20 MG capsule    Sig: 1 po qd or BID    Dispense:  60 capsule    Refill:  5    1. Insulin resistance Goal is HgbA1c < 5.7- at goal currently, fasting insulin closer to 5.  Medication: Ozempic 0.25 subcutaneously weekly.  She is having less sweets cravings now on Ozempic. Tol well, No S-E  Plan:  She will continue to focus on protein-rich, low simple carbohydrate foods. We reviewed the importance of hydration, regular exercise for stress reduction, and restorative sleep.  No need for refill of Ozempic.  Lab Results  Component Value Date   HGBA1C 5.3 01/27/2021   Lab Results  Component Value Date   INSULIN 8.1 01/27/2021   INSULIN 8.2 09/26/2020   INSULIN 8.7 06/19/2020   INSULIN 9.9 03/19/2020   2. Gastroesophageal reflux disease, unspecified whether esophagitis present Lauren Garza is  taking omeprazole 20-40 mg daily.  Since starting Ozempic, she has had more GERD symptoms.  Requests refill of her omeprazole.  Plan:  Will refill omeprazole 20 mg 1-2 times daily as needed for GERD.  We reviewed the diagnosis of GERD and importance of treatment. We discussed "red flag" symptoms and the importance of follow up if symptoms persisted despite treatment. We reviewed non-pharmacologic management of GERD symptoms: including: caffeine reduction, dietary changes, elevate HOB, NPO after supper, reduction of alcohol intake, tobacco cessation, and weight loss.  - Refill omeprazole (PRILOSEC) 20 MG capsule; 1 po qd or BID  Dispense: 60 capsule; Refill: 5   3. At risk for malnutrition Lauren Garza was given extensive malnutrition prevention education and counseling today of more than 9 minutes.  Counseled her that malnutrition refers to inappropriate nutrients or not the right balance of nutrients for optimal health.  Discussed with Lauren Garza that it is absolutely possible to be malnourished but yet obese.  Risk factors, including but not limited to, inappropriate dietary choices, difficulty with obtaining food due to physical or financial limitations, and various physical and mental health conditions were reviewed with Lauren Garza.    4. Class 3 severe obesity with serious comorbidity and body mass index (BMI) of 40.0 to 44.9 in adult, unspecified obesity type ([place])  Course: Lauren Garza is currently in the action stage of change. As such, her goal is to continue with weight loss efforts.  Nutrition goals: She has agreed to the Category 2 Plan with breakfast and lunch options  or  keeping a food journal and adhering to recommended goals of 1200-1300 calories and 90+ grams of protein daily.   Exercise goals:  As is.  Behavioral modification strategies: increasing lean protein intake, decreasing simple carbohydrates, meal planning and cooking strategies, and planning for  success.  Lauren Garza has agreed to follow-up with our clinic in 3 weeks. She was informed of the importance of frequent follow-up visits to maximize her success with intensive lifestyle modifications for her multiple health conditions.   Objective:   Blood pressure 107/67, pulse 68, temperature 98.3 F (36.8 C), height 5\' 3"  (1.6 m), weight 210 lb (95.3 kg), last menstrual period 02/11/2018, SpO2 100 %. Body mass index is 37.2 kg/m.  General: Cooperative, alert, well developed, in no acute distress. HEENT: Conjunctivae and lids unremarkable. Cardiovascular: Regular rhythm.  Lungs: Normal work of breathing. Neurologic: No focal deficits.   Lab Results  Component Value Date   CREATININE 0.86 01/27/2021   BUN 15 01/27/2021   NA 141 01/27/2021   K 4.5 01/27/2021   CL 102 01/27/2021   CO2 21 01/27/2021   Lab Results  Component Value Date   ALT 33 (H) 01/27/2021   AST 34 01/27/2021   ALKPHOS 101 01/27/2021   BILITOT 0.5 01/27/2021   Lab Results  Component Value Date   HGBA1C 5.3 01/27/2021   HGBA1C 5.4 09/26/2020   HGBA1C 5.4 03/19/2020   Lab Results  Component Value Date   INSULIN 8.1 01/27/2021   INSULIN 8.2 09/26/2020   INSULIN 8.7 06/19/2020   INSULIN 9.9 03/19/2020   Lab Results  Component Value Date   TSH 1.360 03/19/2020   Lab Results  Component Value Date   CHOL 165 09/26/2020   HDL 54 09/26/2020   LDLCALC 97 09/26/2020   TRIG 74 09/26/2020   CHOLHDL 3.1 09/26/2020   Lab Results  Component Value Date   WBC 9.1 11/07/2020   HGB 13.2 11/07/2020   HCT 41.9 11/07/2020   MCV 84.5 11/07/2020   PLT 311 11/07/2020   Attestation Statements:   Reviewed by clinician on day of visit: allergies, medications, problem list, medical history, surgical history, family history, social history, and previous encounter notes.  I, Water quality scientist, CMA, am acting as Location manager for [employer], DO.  I have reviewed the above documentation for accuracy and  completeness, and I agree with the above. Marjory Sneddon, D.O.  The [place] was signed into law in 2016 which includes the topic of electronic health records.  This provides immediate access to information in MyChart.  This includes consultation notes, operative notes, office notes, lab results and pathology reports.  If you have any questions about what you read please let us know at your next visit so we can discuss your concerns and take corrective action if need be.  We are right here with you.

## 2021-06-04 ENCOUNTER — Other Ambulatory Visit: Payer: Self-pay

## 2021-06-04 ENCOUNTER — Ambulatory Visit (INDEPENDENT_AMBULATORY_CARE_PROVIDER_SITE_OTHER): Payer: 59 | Admitting: Family Medicine

## 2021-06-04 VITALS — BP 97/68 | HR 79 | Temp 99.1°F | Ht 63.0 in | Wt 214.0 lb

## 2021-06-04 DIAGNOSIS — Z9189 Other specified personal risk factors, not elsewhere classified: Secondary | ICD-10-CM

## 2021-06-04 DIAGNOSIS — Z6841 Body Mass Index (BMI) 40.0 and over, adult: Secondary | ICD-10-CM

## 2021-06-04 DIAGNOSIS — E8881 Metabolic syndrome: Secondary | ICD-10-CM | POA: Diagnosis not present

## 2021-06-04 DIAGNOSIS — E559 Vitamin D deficiency, unspecified: Secondary | ICD-10-CM | POA: Diagnosis not present

## 2021-06-04 DIAGNOSIS — E88819 Insulin resistance, unspecified: Secondary | ICD-10-CM

## 2021-06-04 MED ORDER — OZEMPIC (0.25 OR 0.5 MG/DOSE) 2 MG/1.5ML ~~LOC~~ SOPN: 0.5000 mg | PEN_INJECTOR | SUBCUTANEOUS | 0 refills | Status: DC

## 2021-06-12 ENCOUNTER — Other Ambulatory Visit: Payer: Self-pay

## 2021-06-12 ENCOUNTER — Ambulatory Visit (INDEPENDENT_AMBULATORY_CARE_PROVIDER_SITE_OTHER): Payer: 59 | Admitting: Otolaryngology

## 2021-06-12 DIAGNOSIS — J329 Chronic sinusitis, unspecified: Secondary | ICD-10-CM

## 2021-06-12 NOTE — Progress Notes (Signed)
HPI: FEMALE Lauren Garza is a 40 y.o. female who returns today for evaluation of chronic smell she has had in her nose intermittently for the past 6 months.  She has been on several rounds of antibiotics.  At her last visit we obtained a culture that grew Pseudomonas.  She has previously been treated with Cipro as well as most recently Ceftin by myself.  She has been using saline irrigations as well as steroid nasal sprays but still has intermittent smell in her nose.  She has had 2 previous sinus surgery with Dr. Redmond Baseman.  Past Medical History:  Diagnosis Date   Abnormal Pap smear    Acid reflux    Allergies    Asthma    Bacterial vaginosis    Bursitis    Chlamydia trachomatis infection 2012   Constipation    Depression    DUB (dysfunctional uterine bleeding)    Family history of adverse reaction to anesthesia    mom has n and V past surgery   Frequent headaches    migraines   Hypertension    "not anymore", taken off BP meds per pt report   Meralgia paresthetica    Plantar fasciitis    right    Past Surgical History:  Procedure Laterality Date   GYNECOLOGIC CRYOSURGERY     I & D EXTREMITY Right 04/04/2015   Procedure: IRRIGATION AND DEBRIDEMENT RIGHT WRIST;  Surgeon: Leanora Cover, MD;  Location: [facility];  Service: Orthopedics;  Laterality: Right;   SINUS ENDO WITH FUSION N/A 01/25/2017   Procedure: SINUS ENDO WITH FUSION;  Surgeon: Melida Quitter, MD;  Location: [facility];  Service: ENT;  Laterality: N/A;   TUBAL LIGATION     VAGINAL HYSTERECTOMY Bilateral 02/01/2018   Procedure: HYSTERECTOMY VAGINAL WITH SALPINGECTOMY;  Surgeon: Chancy Milroy, MD;  Location: [facility];  Service: Gynecology;  Laterality: Bilateral;   WISDOM TOOTH EXTRACTION     WRIST SURGERY     Social History   Socioeconomic History   Marital status: Divorced    Spouse name: Not on file   Number of children: 3   Years of education: Not on file   Highest education level: 11th grade  Occupational  History   Occupation: Pensions consultant  Tobacco Use   Smoking status: Former   Smokeless tobacco: Never   Tobacco comments:    rare smoker 9 years ago  Media planner   Vaping Use: Never used  Substance and Sexual Activity   Alcohol use: Yes    Comment: Rare, 1x year   Drug use: No   Sexual activity: Yes    Birth control/protection: Surgical  Other Topics Concern   Not on file  Social History Narrative   Lives at home with her children and parents    Right handed   Caffeine: coffee, 1 cup/day   Social Determinants of Radio broadcast assistant Strain: Not on file  Food Insecurity: Not on file  Transportation Needs: Not on file  Physical Activity: Not on file  Stress: Not on file  Social Connections: Not on file   Family History  Problem Relation Age of Onset   Heart attack Father    Hypertension Mother    Diabetes Mother    Other Mother        questionable nerve issue, has numbness in thighs too   Sleep apnea Mother    Obesity Mother    Diabetes Maternal Grandmother    Allergies  Allergen Reactions  Ceftin [Cefuroxime]     Gives pt bad yeast infection that she has hard time getting rid of.   Clindamycin/Lincomycin Hives   Dust Mite Extract     Unknown reaction   Flagyl [Metronidazole Hcl] Hives   Lincomycin Hcl Hives   Metformin And Related    Prior to Admission medications   Medication Sig Start Date End Date Taking? Authorizing Provider  albuterol (ACCUNEB) 0.63 MG/3ML nebulizer solution Take by nebulization.    [provider]  amoxicillin-clavulanate (AUGMENTIN) 875-125 MG tablet Take 1 tablet by mouth 2 (two) times daily. 05/01/21   Rozetta Nunnery, MD  Boric Acid Vaginal 600 MG SUPP INSERT 1 SUPPOSITORY VAGINALLY AT BEDTIME. FOR VAGINAL USE 01/24/21   [provider]  cetirizine (ZYRTEC) 10 MG tablet Take 10 mg by mouth daily. 03/17/21   [provider]  cyanocobalamin (,VITAMIN B-12,) 1000 MCG/ML injection Inject 1,000 mcg into  the skin every 30 (thirty) days. 01/28/21   [provider]  fluticasone (FLONASE) 50 MCG/ACT nasal spray Place 1 spray into both nostrils daily. 03/17/21   [provider]  montelukast (SINGULAIR) 10 MG tablet SMARTSIG:1 Tablet(s) By Mouth Every Evening 01/01/21   [provider]  omeprazole (PRILOSEC) 20 MG capsule 1 po qd or BID 05/14/21   Opalski, Deborah, DO  Semaglutide,0.25 or 0.5MG /DOS, (OZEMPIC, 0.25 OR 0.5 MG/DOSE,) 2 MG/1.5ML SOPN Inject 0.5 mg into the skin once a week. 06/04/21 07/04/21  Mellody Dance, DO  SYMBICORT 80-4.5 MCG/ACT inhaler SMARTSIG:2 Puff(s) By Mouth Twice Daily 01/14/21   [provider]  valACYclovir (VALTREX) 1000 MG tablet Take 1,000 mg by mouth 3 (three) times daily. 03/24/21   [provider]     Positive ROS: Otherwise negative.  All other systems have been reviewed and were otherwise negative with the exception of those mentioned in the HPI and as above.  Physical Exam: Constitutional: Alert, well-appearing, no acute distress Ears: External ears without lesions or tenderness. Ear canals are clear bilaterally with intact, clear TMs.  Nasal: External nose without lesions. Septum with slight deformity.  Both middle meatus regions appeared clear with no obvious mucopurulent discharge noted.  She had does have mild rhinitis with clear mucus within the nasal cavity.  She has expressed that she has blown out colored discharge from her nose and she had a culture previously that was positive for Pseudomonas..  No polyps noted in the nasal cavity. Oral: Lips and gums without lesions. Tongue and palate mucosa without lesions. Posterior oropharynx clear. Neck: No palpable adenopathy or masses Respiratory: Breathing comfortably  Skin: No facial/neck lesions or rash noted.  Procedures  Assessment: Chronic sinusitis status post previous sinus surgery.  Plan: Since patient has been on several rounds of antibiotics including  Augmentin, Cipro and Ceftin with persistent problems with odor in her nose we will plan on scheduling a CT scan to better evaluate the extent of the sinus disease.   Radene Journey, MD

## 2021-06-12 NOTE — Progress Notes (Signed)
Chief Complaint:   OBESITY Lauren Garza is here to discuss her progress with her obesity treatment plan along with follow-up of her obesity related diagnoses.   Today's visit was #: 31 Starting weight: 237 lbs Starting date: 03/19/2020 Today's weight: 214 lbs Today's date: 06/04/2021 Weight change since last visit: +4 lbs Total lbs lost to date: 23 lbs Body mass index is 37.91 kg/m.  Total weight loss percentage to date: -9.70%  Interim History:  Lauren Garza does not like to journal.  She is following Category 2 with breakfast and lunch options only 50% of the time.   Financially difficult to eat healthy-  on food stamps and skips some foods throughout the day.  Current Meal Plan:  Category 2 Plan with breakfast and lunch options or keeping a food journal and adhering to recommended goals of 1200-1300 calories and 90+ grams of protein daily for 50% of the time.   Current Exercise Plan: None.   Assessment/Plan:   Medications Discontinued During This Encounter  Medication Reason   Semaglutide,0.25 or 0.5MG /DOS, (OZEMPIC, 0.25 OR 0.5 MG/DOSE,) 2 MG/1.5ML SOPN Reorder   Meds ordered this encounter  Medications   Semaglutide,0.25 or 0.5MG /DOS, (OZEMPIC, 0.25 OR 0.5 MG/DOSE,) 2 MG/1.5ML SOPN    Sig: Inject 0.5 mg into the skin once a week.    Dispense:  1.5 mL    Refill:  0    1. Insulin resistance Not at goal. Goal is HgbA1c < 5.7, fasting insulin closer to 5 or less.  Medication: ozempic.   Tol well, no S-E and works well per pt  Plan:  RF medication- ozempic.  She will continue to focus on protein-rich, low simple carbohydrate foods. We reviewed the importance of hydration, regular exercise for stress reduction, and restorative sleep.   Lab Results  Component Value Date   HGBA1C 5.3 01/27/2021   Lab Results  Component Value Date   INSULIN 8.1 01/27/2021   INSULIN 8.2 09/26/2020   INSULIN 8.7 06/19/2020   INSULIN 9.9 03/19/2020   - Refill semaglutide,0.25 or  0.5MG /DOS, (OZEMPIC, 0.25 OR 0.5 MG/DOSE,) 2 MG/1.5ML SOPN; Inject 0.5 mg into the skin once a week.  Dispense: 1.5 mL; Refill: 0    2. Vitamin D deficiency At goal.  She is taking OTC vitamin D 5,000 IU every other day.  Plan:  Stable labs. Continue current OTC vitamin D supplementation.  Follow-up for routine testing of Vitamin D, at least 2-3 times per year to avoid over-replacement.  Lab Results  Component Value Date   VD25OH 67.0 09/26/2020   VD25OH 55.0 08/14/2020   VD25OH 50.2 06/19/2020   3. At risk for malnutrition Lauren Garza was given extensive malnutrition prevention education and counseling today of more than 9 minutes.  Counseled her that malnutrition refers to inappropriate nutrients or not the right balance of nutrients for optimal health.  Discussed with Lauren Garza that it is absolutely possible to be malnourished but yet obese.  Risk factors, including but not limited to, inappropriate dietary choices, difficulty with obtaining food due to physical or financial limitations, and various physical and mental health conditions were reviewed with Lauren Garza.   4. Obesity BMI 37  Course: Lauren Garza is currently in the action stage of change. As such, her goal is to continue with weight loss efforts.   Nutrition goals: She has agreed to Category 2 Plan with breakfast and lunch options  or  keeping a food journal and adhering to recommended goals of 1200-1300 calories and  90+ grams of protein daily   Exercise goals:  As is.  Behavioral modification strategies: increasing lean protein intake, decreasing simple carbohydrates, meal planning and cooking strategies, and planning for success.  Lauren Garza has agreed to follow-up with our clinic in 2 weeks with 1st available.  Usually sees APP once, then me etc.   She was informed of the importance of frequent follow-up visits to maximize her success with intensive lifestyle modifications for her multiple health conditions.    Objective:   Blood pressure 97/68, pulse 79, temperature 99.1 F (37.3 C), height 5\' 3"  (1.6 m), weight 214 lb (97.1 kg), last menstrual period 02/11/2018, SpO2 97 %. Body mass index is 37.91 kg/m.  General: Cooperative, alert, well developed, in no acute distress. HEENT: Conjunctivae and lids unremarkable. Cardiovascular: Regular rhythm.  Lungs: Normal work of breathing. Neurologic: No focal deficits.   Lab Results  Component Value Date   CREATININE 0.86 01/27/2021   BUN 15 01/27/2021   NA 141 01/27/2021   K 4.5 01/27/2021   CL 102 01/27/2021   CO2 21 01/27/2021   Lab Results  Component Value Date   ALT 33 (H) 01/27/2021   AST 34 01/27/2021   ALKPHOS 101 01/27/2021   BILITOT 0.5 01/27/2021   Lab Results  Component Value Date   HGBA1C 5.3 01/27/2021   HGBA1C 5.4 09/26/2020   HGBA1C 5.4 03/19/2020   Lab Results  Component Value Date   INSULIN 8.1 01/27/2021   INSULIN 8.2 09/26/2020   INSULIN 8.7 06/19/2020   INSULIN 9.9 03/19/2020   Lab Results  Component Value Date   TSH 1.360 03/19/2020   Lab Results  Component Value Date   CHOL 165 09/26/2020   HDL 54 09/26/2020   LDLCALC 97 09/26/2020   TRIG 74 09/26/2020   CHOLHDL 3.1 09/26/2020   Lab Results  Component Value Date   VD25OH 67.0 09/26/2020   VD25OH 55.0 08/14/2020   VD25OH 50.2 06/19/2020   Lab Results  Component Value Date   WBC 9.1 11/07/2020   HGB 13.2 11/07/2020   HCT 41.9 11/07/2020   MCV 84.5 11/07/2020   PLT 311 11/07/2020   Attestation Statements:   Reviewed by clinician on day of visit: allergies, medications, problem list, medical history, surgical history, family history, social history, and previous encounter notes.  I, Water quality scientist, CMA, am acting as Location manager for [employer], DO.  I have reviewed the above documentation for accuracy and completeness, and I agree with the above. Marjory Sneddon, D.O.  The [place] was signed into law in 2016  which includes the topic of electronic health records.  This provides immediate access to information in MyChart.  This includes consultation notes, operative notes, office notes, lab results and pathology reports.  If you have any questions about what you read please let us know at your next visit so we can discuss your concerns and take corrective action if need be.  We are right here with you.

## 2021-06-19 ENCOUNTER — Ambulatory Visit (INDEPENDENT_AMBULATORY_CARE_PROVIDER_SITE_OTHER): Payer: 59 | Admitting: Physician Assistant

## 2021-06-24 ENCOUNTER — Other Ambulatory Visit (INDEPENDENT_AMBULATORY_CARE_PROVIDER_SITE_OTHER): Payer: Self-pay | Admitting: Otolaryngology

## 2021-06-24 ENCOUNTER — Other Ambulatory Visit: Payer: Self-pay | Admitting: Otolaryngology

## 2021-06-24 DIAGNOSIS — J329 Chronic sinusitis, unspecified: Secondary | ICD-10-CM

## 2021-06-26 ENCOUNTER — Ambulatory Visit
Admission: RE | Admit: 2021-06-26 | Discharge: 2021-06-26 | Disposition: A | Discharge status: Home / Self Care | Payer: 59 | Source: Ambulatory Visit | Attending: Otolaryngology | Admitting: Otolaryngology

## 2021-06-26 DIAGNOSIS — J329 Chronic sinusitis, unspecified: Secondary | ICD-10-CM

## 2021-07-03 ENCOUNTER — Ambulatory Visit (INDEPENDENT_AMBULATORY_CARE_PROVIDER_SITE_OTHER): Payer: 59 | Admitting: Adult Health

## 2021-07-03 ENCOUNTER — Encounter (INDEPENDENT_AMBULATORY_CARE_PROVIDER_SITE_OTHER): Payer: Self-pay | Admitting: Adult Health

## 2021-07-03 ENCOUNTER — Other Ambulatory Visit: Payer: Self-pay

## 2021-07-03 VITALS — BP 117/74 | HR 61 | Temp 98.9°F | Ht 63.0 in | Wt 207.0 lb

## 2021-07-03 DIAGNOSIS — Z6836 Body mass index (BMI) 36.0-36.9, adult: Secondary | ICD-10-CM

## 2021-07-03 DIAGNOSIS — Z9189 Other specified personal risk factors, not elsewhere classified: Secondary | ICD-10-CM | POA: Diagnosis not present

## 2021-07-03 DIAGNOSIS — E8881 Metabolic syndrome: Secondary | ICD-10-CM

## 2021-07-03 DIAGNOSIS — J329 Chronic sinusitis, unspecified: Secondary | ICD-10-CM

## 2021-07-03 MED ORDER — OZEMPIC (0.25 OR 0.5 MG/DOSE) 2 MG/1.5ML ~~LOC~~ SOPN: 0.5000 mg | PEN_INJECTOR | SUBCUTANEOUS | 0 refills | Status: DC

## 2021-07-07 ENCOUNTER — Ambulatory Visit (INDEPENDENT_AMBULATORY_CARE_PROVIDER_SITE_OTHER): Payer: 59 | Admitting: Otolaryngology

## 2021-07-07 DIAGNOSIS — J329 Chronic sinusitis, unspecified: Secondary | ICD-10-CM | POA: Diagnosis not present

## 2021-07-07 NOTE — Progress Notes (Signed)
HPI: Lauren Garza is a 40 y.o. female who returns today for evaluation of chronic sinus infections.  She has had 2 previous sinus surgeries a couple years ago.  She continues to complain of intermittent colored discharge from her nose as well as a lot of pressure and pain between her eyebrows and in her forehead area.  She recently underwent a CT scan of the sinuses and I will review of this with her in the office today it showed that the sinus ostia were widely patent although the right anterior ethmoid and neck right nasal frontal area was partially obstructed.  The left frontal sinus appeared relatively patent.  Although the maxillary ostia were open she had a moderate amount of debris in both maxillary sinuses.  I reviewed the sinus CT with her in the office today..  Past Medical History:  Diagnosis Date   Abnormal Pap smear    Acid reflux    Allergies    Asthma    Bacterial vaginosis    Bursitis    Chlamydia trachomatis infection 2012   Constipation    Depression    DUB (dysfunctional uterine bleeding)    Family history of adverse reaction to anesthesia    mom has n and V past surgery   Frequent headaches    migraines   Hypertension    "not anymore", taken off BP meds per pt report   Meralgia paresthetica    Plantar fasciitis    right    Past Surgical History:  Procedure Laterality Date   GYNECOLOGIC CRYOSURGERY     I & D EXTREMITY Right 04/04/2015   Procedure: IRRIGATION AND DEBRIDEMENT RIGHT WRIST;  Surgeon: Leanora Cover, MD;  Location: [facility];  Service: Orthopedics;  Laterality: Right;   SINUS ENDO WITH FUSION N/A 01/25/2017   Procedure: SINUS ENDO WITH FUSION;  Surgeon: Melida Quitter, MD;  Location: [facility];  Service: ENT;  Laterality: N/A;   TUBAL LIGATION     VAGINAL HYSTERECTOMY Bilateral 02/01/2018   Procedure: HYSTERECTOMY VAGINAL WITH SALPINGECTOMY;  Surgeon: Chancy Milroy, MD;  Location: [facility];  Service: Gynecology;  Laterality: Bilateral;    WISDOM TOOTH EXTRACTION     WRIST SURGERY     Social History   Socioeconomic History   Marital status: Divorced    Spouse name: Not on file   Number of children: 3   Years of education: Not on file   Highest education level: 11th grade  Occupational History   Occupation: Pensions consultant  Tobacco Use   Smoking status: Former   Smokeless tobacco: Never   Tobacco comments:    rare smoker 9 years ago  Media planner   Vaping Use: Never used  Substance and Sexual Activity   Alcohol use: Yes    Comment: Rare, 1x year   Drug use: No   Sexual activity: Yes    Birth control/protection: Surgical  Other Topics Concern   Not on file  Social History Narrative   Lives at home with her children and parents    Right handed   Caffeine: coffee, 1 cup/day   Social Determinants of Health   Financial Resource Strain: Not on file  Food Insecurity: Not on file  Transportation Needs: Not on file  Physical Activity: Not on file  Stress: Not on file  Social Connections: Not on file   Family History  Problem Relation Age of Onset   Heart attack Father    Hypertension Mother    Diabetes Mother  Other Mother        questionable nerve issue, has numbness in thighs too   Sleep apnea Mother    Obesity Mother    Diabetes Maternal Grandmother    Allergies  Allergen Reactions   Ceftin [Cefuroxime]     Gives pt bad yeast infection that she has hard time getting rid of.   Clindamycin/Lincomycin Hives   Dust Mite Extract     Unknown reaction   Flagyl [Metronidazole Hcl] Hives   Lincomycin Hcl Hives   Metformin And Related    Prior to Admission medications   Medication Sig Start Date End Date Taking? Authorizing Provider  albuterol (ACCUNEB) 0.63 MG/3ML nebulizer solution Take by nebulization.    [provider]  amoxicillin-clavulanate (AUGMENTIN) 875-125 MG tablet Take 1 tablet by mouth 2 (two) times daily. 05/01/21   Rozetta Nunnery, MD  Boric Acid Vaginal 600 MG SUPP INSERT  1 SUPPOSITORY VAGINALLY AT BEDTIME. FOR VAGINAL USE 01/24/21   [provider]  cetirizine (ZYRTEC) 10 MG tablet Take 10 mg by mouth daily. 03/17/21   [provider]  cyanocobalamin (,VITAMIN B-12,) 1000 MCG/ML injection Inject 1,000 mcg into the skin every 30 (thirty) days. 01/28/21   [provider]  fluticasone (FLONASE) 50 MCG/ACT nasal spray Place 1 spray into both nostrils daily. 03/17/21   [provider]  montelukast (SINGULAIR) 10 MG tablet SMARTSIG:1 Tablet(s) By Mouth Every Evening 01/01/21   [provider]  omeprazole (PRILOSEC) 20 MG capsule 1 po qd or BID 05/14/21   Opalski, Deborah, DO  Semaglutide,0.25 or 0.'5MG'$ /DOS, (OZEMPIC, 0.25 OR 0.5 MG/DOSE,) 2 MG/1.5ML SOPN Inject 0.5 mg into the skin once a week. 07/03/21 08/02/21  Esaw Grandchild, NP  SYMBICORT 80-4.5 MCG/ACT inhaler SMARTSIG:2 Puff(s) By Mouth Twice Daily 01/14/21   [provider]  valACYclovir (VALTREX) 1000 MG tablet Take 1,000 mg by mouth 3 (three) times daily. 03/24/21   [provider]     Positive ROS: Otherwise negative  All other systems have been reviewed and were otherwise negative with the exception of those mentioned in the HPI and as above.  Physical Exam: Constitutional: Alert, well-appearing, no acute distress Ears: External ears without lesions or tenderness. Ear canals are clear bilaterally with intact, clear TMs.  Nasal: External nose without lesions. Septum with minimal deformity.  The middle meatus regions were widely patent bilaterally.  On nasal endoscopy the left nasal frontal region was clear with no purulent discharge.  The left maxillary sinus had a small thin layer of drainage coming out of the sinus ostia.  The posterior ethmoid area was clear.  On nasal endoscopy on the right side is little bit hard to definitely visualize the right nasofrontal duct area.  The maxillary ostia was widely patent but had some debris within the maxillary sinus  along the floor of the sinus..  Oral: Lips and gums without lesions. Tongue and palate mucosa without lesions. Posterior oropharynx clear. Neck: No palpable adenopathy or masses Respiratory: Breathing comfortably  Skin: No facial/neck lesions or rash noted.  Procedures  Assessment: Residual sinus disease following previous surgery x2.  It appears that the right nasal frontal area can be improved with surgery.    Plan: She has taken numerous antibiotics as well as steroids in the past as well as using saline rinses and steroid nasal sprays which I would recommend continuing with. Discussed with her that she might benefit from further surgical intervention but would recommend evaluation by sinus surgeon at one  of the tertiary care hospital such as [facility] or [facility] who have perform more of these more difficult procedures.  Could consider [facility] but I am not sure they take [employer] which is her insurance. She will follow-up here as needed.   Radene Journey, MD

## 2021-07-08 NOTE — Progress Notes (Signed)
Chief Complaint:   OBESITY Deryn is here to discuss her progress with her obesity treatment plan along with follow-up of her obesity related diagnoses. Asli is on the Category 2 Plan with breakfast and lunch options, or keeping a food journal and adhering to recommended goals of 1200-1300 calories and 90+ grams of protein daily and states she is following her eating plan approximately 60% of the time. Corisha states she is doing bike riding for 10 minutes 2 times per week.  Today's visit was #: 28 Starting weight: 237 lbs Starting date: 03/19/2020 Today's weight: 207 lbs Today's date: 07/03/2021 Total lbs lost to date: 30 Total lbs lost since last in-office visit: 7  Interim History: Sherree had a CT maxillofacial with no contrast on 06/26/2021, and she has a follow up with Dr. Lucia Gaskins, ENT.  She has started using her home stationary bike.  Subjective:   1. Insulin resistance Kele's last A1c was 5.3, BG 105, and insulin 8.1 on 01/27/2021. She was unable to tolerate Ozempic 0.5 mg dose (headache and nausea). She is taking Ozempic 0.25 mg Tuesday and 0.25 mg on Friday, with no side effects with this spacing.   2. Chronic sinusitis, unspecified location  Brandon had a CT maxillofacial with no contrast on 06/26/2021, and she has a follow up with Dr. Lucia Gaskins, ENT.   3. At risk for side effect of medication Treasea is at risk  for drug side effects with full dose of 0.5 mg of Ozempic.  Assessment/Plan:   1. Insulin resistance Roshon will continue to work on weight loss, exercise, and decreasing simple carbohydrates to help decrease the risk of diabetes. Siomara agreed to follow-up with [place] as directed to closely monitor her progress.  - Semaglutide,0.25 or 0.'5MG'$ /DOS, (OZEMPIC, 0.25 OR 0.5 MG/DOSE,) 2 MG/1.5ML SOPN; Inject 0.5 mg into the skin once a week.  Dispense: 1.5 mL; Refill: 0  2. Chronic sinusitis, unspecified location Doninique will follow up with Dr.  Lucia Gaskins, ENT on August 8th 2022.  3. At risk for side effect of medication Rodnika was given approximately 15 minutes of drug side effect counseling today.  We discussed side effect possibility and risk versus benefits. Saphia agreed to the medication, continue to break up her dosage, and will contact this office if these side effects are intolerable.  Repetitive spaced learning was employed today to elicit superior memory formation and behavioral change.  4. Class 2 severe obesity with serious comorbidity and body mass index (BMI) of 36.0 to 36.9 in adult, unspecified obesity type (HCC) Kerstyn is currently in the action stage of change. As such, her goal is to continue with weight loss efforts. She has agreed to the Category 2 Plan.   We will recheck fasting labs at her next office visit.  Exercise goals: As is.   Behavioral modification strategies: increasing lean protein intake, decreasing simple carbohydrates, increasing water intake, meal planning and cooking strategies, keeping healthy foods in the home, and planning for success.  Briony has agreed to follow-up with our clinic in 18 weeks with Dr. Jearld Shines. She was informed of the importance of frequent follow-up visits to maximize her success with intensive lifestyle modifications for her multiple health conditions.   Objective:   Blood pressure 117/74, pulse 61, temperature 98.9 F (37.2 C), height '5\' 3"'$  (1.6 m), weight 207 lb (93.9 kg), last menstrual period 02/11/2018, SpO2 99 %. Body mass index is 36.67 kg/m.  General: Cooperative, alert, well developed, in no acute distress. HEENT: Conjunctivae and  lids unremarkable. Cardiovascular: Regular rhythm.  Lungs: Normal work of breathing. Neurologic: No focal deficits.   Lab Results  Component Value Date   CREATININE 0.86 01/27/2021   BUN 15 01/27/2021   NA 141 01/27/2021   K 4.5 01/27/2021   CL 102 01/27/2021   CO2 21 01/27/2021   Lab Results  Component Value  Date   ALT 33 (H) 01/27/2021   AST 34 01/27/2021   ALKPHOS 101 01/27/2021   BILITOT 0.5 01/27/2021   Lab Results  Component Value Date   HGBA1C 5.3 01/27/2021   HGBA1C 5.4 09/26/2020   HGBA1C 5.4 03/19/2020   Lab Results  Component Value Date   INSULIN 8.1 01/27/2021   INSULIN 8.2 09/26/2020   INSULIN 8.7 06/19/2020   INSULIN 9.9 03/19/2020   Lab Results  Component Value Date   TSH 1.360 03/19/2020   Lab Results  Component Value Date   CHOL 165 09/26/2020   HDL 54 09/26/2020   LDLCALC 97 09/26/2020   TRIG 74 09/26/2020   CHOLHDL 3.1 09/26/2020   Lab Results  Component Value Date   VD25OH 67.0 09/26/2020   VD25OH 55.0 08/14/2020   VD25OH 50.2 06/19/2020   Lab Results  Component Value Date   WBC 9.1 11/07/2020   HGB 13.2 11/07/2020   HCT 41.9 11/07/2020   MCV 84.5 11/07/2020   PLT 311 11/07/2020   No results found for: IRON, TIBC, FERRITIN  Attestation Statements:   Reviewed by clinician on day of visit: allergies, medications, problem list, medical history, surgical history, family history, social history, and previous encounter notes.   Wilhemena Durie, am acting as transcriptionist for Mina Marble, NP.  I have reviewed the above documentation for accuracy and completeness, and I agree with the above. -  Anneta Rounds d. Olivette Beckmann, NP-C

## 2021-07-17 ENCOUNTER — Ambulatory Visit (INDEPENDENT_AMBULATORY_CARE_PROVIDER_SITE_OTHER): Payer: 59 | Admitting: Family Medicine

## 2021-07-17 ENCOUNTER — Encounter (INDEPENDENT_AMBULATORY_CARE_PROVIDER_SITE_OTHER): Payer: Self-pay | Admitting: Family Medicine

## 2021-07-17 ENCOUNTER — Other Ambulatory Visit: Payer: Self-pay

## 2021-07-17 VITALS — BP 116/76 | HR 82 | Ht 63.0 in | Wt 207.0 lb

## 2021-07-17 DIAGNOSIS — Z6841 Body Mass Index (BMI) 40.0 and over, adult: Secondary | ICD-10-CM

## 2021-07-17 DIAGNOSIS — E8881 Metabolic syndrome: Secondary | ICD-10-CM

## 2021-07-17 DIAGNOSIS — E88819 Insulin resistance, unspecified: Secondary | ICD-10-CM

## 2021-07-21 NOTE — Progress Notes (Signed)
Chief Complaint:   OBESITY Lauren Garza is here to discuss her progress with her obesity treatment plan along with follow-up of her obesity related diagnoses. Lauren Garza is on the Category 2 Plan and states she is following her eating plan approximately 80% of the time. Lauren Garza states she is doing 0 minutes 0 times per week.  Today's visit was #: 29 Starting weight: 237 lbs Starting date: 03/19/2020 Today's weight: 207 lbs Today's date: 07/17/2021 Total lbs lost to date: 30 lbs Total lbs lost since last in-office visit: 0  Interim History: Lauren Garza feels she is getting in adequate protein. She is eating 3 meals per day. She and her coworkers ate at [employer] and she ate salmon and vegetables. She has a stationary bike but has not been using it. She drinks only water. Her hunger is well controlled. Her snacking has reduced since starting Ozempic.  Subjective:   1. Insulin resistance Lauren Garza feels the Ozempic is helping with her appetite and cravings. She takes 0.25 mg twice weekly.  Lab Results  Component Value Date   INSULIN 8.1 01/27/2021   INSULIN 8.2 09/26/2020   INSULIN 8.7 06/19/2020   INSULIN 9.9 03/19/2020   Lab Results  Component Value Date   HGBA1C 5.3 01/27/2021    Assessment/Plan:   1. Insulin resistance Lauren Garza will continue Ozempic 0.25 mg twice weekly.  2. Obesity: Current BMI 36.68 Lauren Garza is currently in the action stage of change. As such, her goal is to continue with weight loss efforts. She has agreed to the Category 2 Plan and keeping a food journal and adhering to recommended goals of 400-550 calories and 35 grams of protein at supper.  Recipes: provided today.  Exercise goals:  Lauren Garza will ride a bike for 10 minutes 2 times a week.  Behavioral modification strategies: increasing lean protein intake and decreasing simple carbohydrates.  Lauren Garza has agreed to follow-up with our clinic in 2-3 weeks. She was informed of the importance  of frequent follow-up visits to maximize her success with intensive lifestyle modifications for her multiple health conditions.   Objective:   Blood pressure 116/76, pulse 82, height '5\' 3"'$  (1.6 m), weight 207 lb (93.9 kg), last menstrual period 02/11/2018, SpO2 99 %. Body mass index is 36.67 kg/m.  General: Cooperative, alert, well developed, in no acute distress. HEENT: Conjunctivae and lids unremarkable. Cardiovascular: Regular rhythm.  Lungs: Normal work of breathing. Neurologic: No focal deficits.   Lab Results  Component Value Date   CREATININE 0.86 01/27/2021   BUN 15 01/27/2021   NA 141 01/27/2021   K 4.5 01/27/2021   CL 102 01/27/2021   CO2 21 01/27/2021   Lab Results  Component Value Date   ALT 33 (H) 01/27/2021   AST 34 01/27/2021   ALKPHOS 101 01/27/2021   BILITOT 0.5 01/27/2021   Lab Results  Component Value Date   HGBA1C 5.3 01/27/2021   HGBA1C 5.4 09/26/2020   HGBA1C 5.4 03/19/2020   Lab Results  Component Value Date   INSULIN 8.1 01/27/2021   INSULIN 8.2 09/26/2020   INSULIN 8.7 06/19/2020   INSULIN 9.9 03/19/2020   Lab Results  Component Value Date   TSH 1.360 03/19/2020   Lab Results  Component Value Date   CHOL 165 09/26/2020   HDL 54 09/26/2020   LDLCALC 97 09/26/2020   TRIG 74 09/26/2020   CHOLHDL 3.1 09/26/2020   Lab Results  Component Value Date   VD25OH 67.0 09/26/2020   VD25OH 55.0 08/14/2020  VD25OH 50.2 06/19/2020   Lab Results  Component Value Date   WBC 9.1 11/07/2020   HGB 13.2 11/07/2020   HCT 41.9 11/07/2020   MCV 84.5 11/07/2020   PLT 311 11/07/2020   No results found for: IRON, TIBC, FERRITIN  Attestation Statements:   Reviewed by clinician on day of visit: allergies, medications, problem list, medical history, surgical history, family history, social history, and previous encounter notes.  I, Lauren Garza, RMA, am acting as Location manager for [employer], [place].   I have reviewed the above documentation  for accuracy and completeness, and I agree with the above. -  Lauren Fick, FNP

## 2021-07-31 ENCOUNTER — Other Ambulatory Visit: Payer: Self-pay

## 2021-07-31 ENCOUNTER — Encounter (INDEPENDENT_AMBULATORY_CARE_PROVIDER_SITE_OTHER): Payer: Self-pay | Admitting: Adult Health

## 2021-07-31 ENCOUNTER — Ambulatory Visit (INDEPENDENT_AMBULATORY_CARE_PROVIDER_SITE_OTHER): Payer: 59 | Admitting: Adult Health

## 2021-07-31 VITALS — BP 102/71 | HR 94 | Temp 98.1°F | Ht 63.0 in | Wt 203.0 lb

## 2021-07-31 DIAGNOSIS — E8881 Metabolic syndrome: Secondary | ICD-10-CM | POA: Diagnosis not present

## 2021-07-31 DIAGNOSIS — R7401 Elevation of levels of liver transaminase levels: Secondary | ICD-10-CM | POA: Diagnosis not present

## 2021-07-31 DIAGNOSIS — E66813 Obesity, class 3: Secondary | ICD-10-CM

## 2021-07-31 DIAGNOSIS — Z9189 Other specified personal risk factors, not elsewhere classified: Secondary | ICD-10-CM | POA: Diagnosis not present

## 2021-07-31 DIAGNOSIS — Z6841 Body Mass Index (BMI) 40.0 and over, adult: Secondary | ICD-10-CM

## 2021-07-31 DIAGNOSIS — E88819 Insulin resistance, unspecified: Secondary | ICD-10-CM

## 2021-07-31 DIAGNOSIS — E559 Vitamin D deficiency, unspecified: Secondary | ICD-10-CM

## 2021-07-31 MED ORDER — OZEMPIC (0.25 OR 0.5 MG/DOSE) 2 MG/1.5ML ~~LOC~~ SOPN: 0.5000 mg | PEN_INJECTOR | SUBCUTANEOUS | 0 refills | Status: DC

## 2021-07-31 NOTE — Progress Notes (Signed)
Chief Complaint:   OBESITY Lauren Garza is here to discuss her progress with her obesity treatment plan along with follow-up of her obesity related diagnoses. Lauren Garza is on the Category 2 Plan and keeping a food journal and adhering to recommended goals of 400-550 calories and 35 grams of protein at supper and states she is following her eating plan approximately 80% of the time. Lauren Garza states she is not exercising regularly.  Today's visit was #: 87 Starting weight: 237 lbs Starting date: 03/19/2020 Today's weight: 203 lbs Today's date: 07/31/2021 Total lbs lost to date: 34 lbs Total lbs lost since last in-office visit: 4 lbs  Interim History: Lauren Garza has really focused on increasing her water intake and consistent compliance on Category 2 meal plan. She will routinely consume >100 ounces of water per day.  Subjective:   1. Insulin resistance She is on Ozempic 0.5 mg - will take 0.25 mg spaced out by 4 days.  The entire 0.5 mg dose can cause headache and GI upset. She denies mass in neck, dysphagia, dyspepsia, or persistent hoarseness.  Lab Results  Component Value Date   INSULIN 8.1 01/27/2021   INSULIN 8.2 09/26/2020   INSULIN 8.7 06/19/2020   INSULIN 9.9 03/19/2020   Lab Results  Component Value Date   HGBA1C 5.3 01/27/2021   2. Vitamin D deficiency She is OTC multivitamin sporadically.    Lab Results  Component Value Date   VD25OH 67.0 09/26/2020   VD25OH 55.0 08/14/2020   VD25OH 50.2 06/19/2020   3. Transaminitis She denies RUQ abdominal pain.  4. At risk for constipation Lauren Garza is at increased risk for constipation due to taking GLP-1 for insulin resistance and weight loss.    Assessment/Plan:   1. Insulin resistance Refill Ozempic 0.5 mg once weekly, as per below. Check labs.  - Hemoglobin A1c - Insulin, random - Refill Semaglutide,0.25 or 0.'5MG'$ /DOS, (OZEMPIC, 0.25 OR 0.5 MG/DOSE,) 2 MG/1.5ML SOPN; Inject 0.5 mg into the skin once a week.   Dispense: 1.5 mL; Refill: 0  2. Vitamin D deficiency Check labs today.  - VITAMIN D 25 Hydroxy (Vit-D Deficiency, Fractures)  3. Transaminitis Check labs.  - Comprehensive metabolic panel - Lipid panel  4. At risk for constipation Lauren Garza was given approximately 15 minutes of counseling today regarding prevention of constipation. She was encouraged to increase water and fiber intake.    5. Obesity: Current BMI 36.1  Lauren Garza is currently in the action stage of change. As such, her goal is to continue with weight loss efforts. She has agreed to the Category 2 Plan and keeping a food journal and adhering to recommended goals of 400-550 calories and 35 grams of protein at supper.   Exercise goals: For substantial health benefits, adults should do at least 150 minutes (2 hours and 30 minutes) a week of moderate-intensity, or 75 minutes (1 hour and 15 minutes) a week of vigorous-intensity aerobic physical activity, or an equivalent combination of moderate- and vigorous-intensity aerobic activity. Aerobic activity should be performed in episodes of at least 10 minutes, and preferably, it should be spread throughout the week.  Behavioral modification strategies: increasing lean protein intake, decreasing simple carbohydrates, meal planning and cooking strategies, keeping healthy foods in the home, and planning for success.  Lauren Garza has agreed to follow-up with our clinic in 2 weeks. She was informed of the importance of frequent follow-up visits to maximize her success with intensive lifestyle modifications for her multiple health conditions.   Lauren Garza was informed  we would discuss her lab results at her next visit unless there is a critical issue that needs to be addressed sooner. Lauren Garza agreed to keep her next visit at the agreed upon time to discuss these results.  Objective:   Blood pressure 102/71, pulse 94, temperature 98.1 F (36.7 C), height '5\' 3"'$  (1.6 m), weight 203 lb  (92.1 kg), last menstrual period 02/11/2018, SpO2 97 %. Body mass index is 35.96 kg/m.  General: Cooperative, alert, well developed, in no acute distress. HEENT: Conjunctivae and lids unremarkable. Cardiovascular: Regular rhythm.  Lungs: Normal work of breathing. Neurologic: No focal deficits.   Lab Results  Component Value Date   CREATININE 0.86 01/27/2021   BUN 15 01/27/2021   NA 141 01/27/2021   K 4.5 01/27/2021   CL 102 01/27/2021   CO2 21 01/27/2021   Lab Results  Component Value Date   ALT 33 (H) 01/27/2021   AST 34 01/27/2021   ALKPHOS 101 01/27/2021   BILITOT 0.5 01/27/2021   Lab Results  Component Value Date   HGBA1C 5.3 01/27/2021   HGBA1C 5.4 09/26/2020   HGBA1C 5.4 03/19/2020   Lab Results  Component Value Date   INSULIN 8.1 01/27/2021   INSULIN 8.2 09/26/2020   INSULIN 8.7 06/19/2020   INSULIN 9.9 03/19/2020   Lab Results  Component Value Date   TSH 1.360 03/19/2020   Lab Results  Component Value Date   CHOL 165 09/26/2020   HDL 54 09/26/2020   LDLCALC 97 09/26/2020   TRIG 74 09/26/2020   CHOLHDL 3.1 09/26/2020   Lab Results  Component Value Date   VD25OH 67.0 09/26/2020   VD25OH 55.0 08/14/2020   VD25OH 50.2 06/19/2020   Lab Results  Component Value Date   WBC 9.1 11/07/2020   HGB 13.2 11/07/2020   HCT 41.9 11/07/2020   MCV 84.5 11/07/2020   PLT 311 11/07/2020   Attestation Statements:   Reviewed by clinician on day of visit: allergies, medications, problem list, medical history, surgical history, family history, social history, and previous encounter notes.  I, Water quality scientist, CMA, am acting as Location manager for Mina Marble, NP.  I have reviewed the above documentation for accuracy and completeness, and I agree with the above. -  Mahkai Fangman d. Kathlean Cinco, NP-C

## 2021-08-01 LAB — COMPREHENSIVE METABOLIC PANEL
ALT: 12 IU/L (ref 0–32)
AST: 15 IU/L (ref 0–40)
Albumin/Globulin Ratio: 1.6 (ref 1.2–2.2)
Albumin: 4.4 g/dL (ref 3.8–4.8)
Alkaline Phosphatase: 93 IU/L (ref 44–121)
BUN/Creatinine Ratio: 14 (ref 9–23)
BUN: 12 mg/dL (ref 6–20)
Bilirubin Total: 0.4 mg/dL (ref 0.0–1.2)
CO2: 23 mmol/L (ref 20–29)
Calcium: 9.6 mg/dL (ref 8.7–10.2)
Chloride: 102 mmol/L (ref 96–106)
Creatinine, Ser: 0.86 mg/dL (ref 0.57–1.00)
Globulin, Total: 2.7 g/dL (ref 1.5–4.5)
Glucose: 75 mg/dL (ref 65–99)
Potassium: 4.8 mmol/L (ref 3.5–5.2)
Sodium: 140 mmol/L (ref 134–144)
Total Protein: 7.1 g/dL (ref 6.0–8.5)
eGFR: 89 mL/min/{1.73_m2} (ref >59)

## 2021-08-01 LAB — VITAMIN D 25 HYDROXY (VIT D DEFICIENCY, FRACTURES): Vit D, 25-Hydroxy: 42.2 ng/mL (ref 30.0–100.0)

## 2021-08-01 LAB — LIPID PANEL
Chol/HDL Ratio: 3.4 ratio (ref 0.0–4.4)
Cholesterol, Total: 179 mg/dL (ref 100–199)
HDL: 53 mg/dL (ref >39)
LDL Chol Calc (NIH): 116 mg/dL — ABNORMAL HIGH (ref 0–99)
Triglycerides: 53 mg/dL (ref 0–149)
VLDL Cholesterol Cal: 10 mg/dL (ref 5–40)

## 2021-08-01 LAB — HEMOGLOBIN A1C
Est. average glucose Bld gHb Est-mCnc: 111 mg/dL
Hgb A1c MFr Bld: 5.5 % (ref 4.8–5.6)

## 2021-08-01 LAB — INSULIN, RANDOM: INSULIN: 7 u[IU]/mL (ref 2.6–24.9)

## 2021-08-14 ENCOUNTER — Other Ambulatory Visit: Payer: Self-pay

## 2021-08-14 ENCOUNTER — Ambulatory Visit (INDEPENDENT_AMBULATORY_CARE_PROVIDER_SITE_OTHER): Payer: 59 | Admitting: Family Medicine

## 2021-08-14 ENCOUNTER — Encounter (INDEPENDENT_AMBULATORY_CARE_PROVIDER_SITE_OTHER): Payer: Self-pay | Admitting: Family Medicine

## 2021-08-14 VITALS — BP 107/71 | HR 80 | Temp 98.2°F | Ht 63.0 in | Wt 205.0 lb

## 2021-08-14 DIAGNOSIS — Z9189 Other specified personal risk factors, not elsewhere classified: Secondary | ICD-10-CM | POA: Diagnosis not present

## 2021-08-14 DIAGNOSIS — E559 Vitamin D deficiency, unspecified: Secondary | ICD-10-CM | POA: Diagnosis not present

## 2021-08-14 DIAGNOSIS — R7401 Elevation of levels of liver transaminase levels: Secondary | ICD-10-CM

## 2021-08-14 DIAGNOSIS — Z6841 Body Mass Index (BMI) 40.0 and over, adult: Secondary | ICD-10-CM

## 2021-08-14 DIAGNOSIS — J3089 Other allergic rhinitis: Secondary | ICD-10-CM | POA: Diagnosis not present

## 2021-08-14 DIAGNOSIS — E7849 Other hyperlipidemia: Secondary | ICD-10-CM

## 2021-08-14 DIAGNOSIS — E8881 Metabolic syndrome: Secondary | ICD-10-CM | POA: Diagnosis not present

## 2021-08-14 MED ORDER — VITAMIN D (ERGOCALCIFEROL) 1.25 MG (50000 UNIT) PO CAPS: ORAL_CAPSULE | ORAL | 0 refills | Status: DC

## 2021-08-14 MED ORDER — OZEMPIC (0.25 OR 0.5 MG/DOSE) 2 MG/1.5ML ~~LOC~~ SOPN: 0.5000 mg | PEN_INJECTOR | SUBCUTANEOUS | 0 refills | Status: AC

## 2021-08-14 MED ORDER — LEVOCETIRIZINE DIHYDROCHLORIDE 5 MG PO TABS: 5.0000 mg | ORAL_TABLET | Freq: Every evening | ORAL | 0 refills | Status: DC

## 2021-08-18 ENCOUNTER — Other Ambulatory Visit (INDEPENDENT_AMBULATORY_CARE_PROVIDER_SITE_OTHER): Payer: Self-pay | Admitting: Otolaryngology

## 2021-08-18 ENCOUNTER — Telehealth (INDEPENDENT_AMBULATORY_CARE_PROVIDER_SITE_OTHER): Payer: Self-pay | Admitting: Otolaryngology

## 2021-08-18 MED ORDER — PREDNISONE 10 MG PO TABS: 10.0000 mg | ORAL_TABLET | Freq: Every day | ORAL | 0 refills | Status: DC

## 2021-08-18 MED ORDER — AMOXICILLIN-POT CLAVULANATE 875-125 MG PO TABS: 1.0000 | ORAL_TABLET | Freq: Two times a day (BID) | ORAL | 0 refills | Status: DC

## 2021-08-18 NOTE — Telephone Encounter (Signed)
Patient called again today because of recurrent sinus infection.  With colored discharge from her nose and an odor in the nose. She called to receive antibiotics. I called in Augmentin 875 mg twice daily for 10 days and day prednisone 10 mg 6-day taper. Also discussed with her concerning probable need for further surgery with one of the sinus surgical experts as she has had 2 previous sinus surgeries and still having problems.

## 2021-08-18 NOTE — Progress Notes (Signed)
Chief Complaint:   OBESITY Lauren Garza is here to discuss her progress with her obesity treatment plan along with follow-up of her obesity related diagnoses. Lauren Garza is on the Category 2 Plan and keeping a food journal and adhering to recommended goals of 400-500 calories and 35 grams of protein at supper daily and states she is following her eating plan approximately 80% of the time. Lauren Garza states she is doing 0 minutes 0 times per week.  Today's visit was #: 58 Starting weight: 237 lbs Starting date: 03/19/2020 Today's weight: 205 lbs Today's date: 08/14/2021 Total lbs lost to date: 32 Total lbs lost since last in-office visit: 0  Interim History: Lauren Garza is here for a follow up office visit.  We reviewed her meal plan and questions were answered. Patient's food recall appears to be accurate and consistent with what is on plan when she is following it. When eating on plan, her hunger and cravings are well controlled.   [name] has been eating pound cake, cupcakes, etc lately. She is here to go over her labs.  Subjective:   1. Environmental and seasonal allergies Lauren Garza's allergies have been bad lately. I discussed labs with the patient today.  2. Elevated alanine aminotransferase (ALT) level Lauren Garza has elevated ALT. She denies abdominal pain or jaundice and has never been told of any liver problems in the past. She denies excessive alcohol intake. I discussed labs with the patient today.  3. Insulin resistance Lauren Garza's A1c is worsening. She takes 0.25 mg BI-weekly because she can not handle the side effects on the entire 0.5 mg at one time. She is tolerating it well and she notes it is working well. I discussed labs with the patient today.  4. Other hyperlipidemia Lauren Garza has worsening elevated LDL from prior. She has been trying to improve her cholesterol levels with intensive lifestyle modification including a low saturated fat diet, exercise and  weight loss. She denies any chest pain, claudication or myalgias. I discussed labs with the patient today.  5. Vitamin D deficiency Lauren Garza's Vit D level is worsening. She is not currently taking vitamin D supplement. She denies nausea, vomiting or muscle weakness.  6. At risk for diabetes mellitus Lauren Garza is at higher than average risk for developing diabetes due to worsening A1c and insulin resistance.  Assessment/Plan:  No orders of the defined types were placed in this encounter.   Medications Discontinued During This Encounter  Medication Reason   cetirizine (ZYRTEC) 10 MG tablet    Semaglutide,0.25 or 0.5MG /DOS, (OZEMPIC, 0.25 OR 0.5 MG/DOSE,) 2 MG/1.5ML SOPN Reorder     Meds ordered this encounter  Medications   Semaglutide,0.25 or 0.5MG /DOS, (OZEMPIC, 0.25 OR 0.5 MG/DOSE,) 2 MG/1.5ML SOPN    Sig: Inject 0.5 mg into the skin once a week.    Dispense:  1.5 mL    Refill:  0    30 d supply;  ** OV for RF **   Do not send RF request   levocetirizine (XYZAL) 5 MG tablet    Sig: Take 1 tablet (5 mg total) by mouth every evening.    Dispense:  30 tablet    Refill:  0    30 d supply;  ** OV for RF **   Do not send RF request   Vitamin D, Ergocalciferol, (DRISDOL) 1.25 MG (50000 UNIT) CAPS capsule    Sig: 1 po q 10 d    Dispense:  3 capsule    Refill:  0  30 d supply;  ** OV for RF **   Do not send RF request     1. Environmental and seasonal allergies Lauren Garza agreed to start Xyzal 5 mg, and discontinue Zyrtec; she is take qhs with Singulair.  - levocetirizine (XYZAL) 5 MG tablet; Take 1 tablet (5 mg total) by mouth every evening.  Dispense: 30 tablet; Refill: 0  2. Elevated alanine aminotransferase (ALT) level We discussed the likely diagnosis of non-alcoholic fatty liver disease today and how this condition is obesity related. Lauren Garza's level is now within normal limits. She was educated the importance of weight loss. Lauren Garza agreed to continue with her weight  loss efforts with healthier diet and exercise as an essential part of her treatment plan.  3. Insulin resistance We will refill Ozempic 0.5 mg q weekly for 1 month. [name] decreasing simple carbohydrates to help decrease the risk of diabetes. [name] agreed to follow-up with [place] as directed to closely monitor her progress.  - Semaglutide,0.25 or 0.5MG /DOS, (OZEMPIC, 0.25 OR 0.5 MG/DOSE,) 2 MG/1.5ML SOPN; Inject 0.5 mg into the skin once a week.  Dispense: 1.5 mL; Refill: 0  4. Other hyperlipidemia Cardiovascular risk and specific lipid/LDL goals reviewed. We discussed several lifestyle modifications today. Lauren Garza will continue to work on diet, exercise and weight loss efforts. She is to decrease saturated and trans fats. Orders and follow up as documented in patient record.   5. Vitamin D deficiency Low Vitamin D level contributes to fatigue and are associated with obesity, breast, and colon cancer. Lauren Garza agreed to start prescription Vitamin D 50,000 IU every 10 days with no refills. She will follow-up for routine testing of Vitamin D, at least 2-3 times per year to avoid over-replacement.  - Vitamin D, Ergocalciferol, (DRISDOL) 1.25 MG (50000 UNIT) CAPS capsule; 1 po q 10 d  Dispense: 3 capsule; Refill: 0  6. At risk for diabetes mellitus Lauren Garza was given approximately 22 minutes of diabetes education and counseling today. We discussed intensive lifestyle modifications today with an emphasis on weight loss as well as increasing exercise and decreasing simple carbohydrates in her diet. We also reviewed medication options with an emphasis on risk versus benefit of those discussed.   Repetitive spaced learning was employed today to elicit superior memory formation and behavioral change.  7. Obesity with current BMI of 36.3 Lauren Garza is currently in the action stage of change. As such, her goal is to continue with weight loss efforts. She has agreed to the Category 2 Plan and keeping  a food journal and adhering to recommended goals of 400-500 calories and 35 grams of protein at supper daily.   Exercise goals: All adults should avoid inactivity. Some physical activity is better than none, and adults who participate in any amount of physical activity gain some health benefits.  Behavioral modification strategies: increasing lean protein intake, decreasing simple carbohydrates, and planning for success.  Cesar has agreed to follow-up with our clinic in 3 weeks. She was informed of the importance of frequent follow-up visits to maximize her success with intensive lifestyle modifications for her multiple health conditions.   Objective:   Blood pressure 107/71, pulse 80, temperature 98.2 F (36.8 C), height 5\' 3"  (1.6 m), weight 205 lb (93 kg), last menstrual period 02/11/2018, SpO2 96 %. Body mass index is 36.31 kg/m.  General: Cooperative, alert, well developed, in no acute distress. HEENT: Conjunctivae and lids unremarkable. Cardiovascular: Regular rhythm.  Lungs: Normal work of breathing. Neurologic: No focal deficits.   Lab Results  Component Value Date   CREATININE 0.86 07/31/2021   BUN 12 07/31/2021   NA 140 07/31/2021   K 4.8 07/31/2021   CL 102 07/31/2021   CO2 23 07/31/2021   Lab Results  Component Value Date   ALT 12 07/31/2021   AST 15 07/31/2021   ALKPHOS 93 07/31/2021   BILITOT 0.4 07/31/2021   Lab Results  Component Value Date   HGBA1C 5.5 07/31/2021   HGBA1C 5.3 01/27/2021   HGBA1C 5.4 09/26/2020   HGBA1C 5.4 03/19/2020   Lab Results  Component Value Date   INSULIN 7.0 07/31/2021   INSULIN 8.1 01/27/2021   INSULIN 8.2 09/26/2020   INSULIN 8.7 06/19/2020   INSULIN 9.9 03/19/2020   Lab Results  Component Value Date   TSH 1.360 03/19/2020   Lab Results  Component Value Date   CHOL 179 07/31/2021   HDL 53 07/31/2021   LDLCALC 116 (H) 07/31/2021   TRIG 53 07/31/2021   CHOLHDL 3.4 07/31/2021   Lab Results  Component Value  Date   VD25OH 42.2 07/31/2021   VD25OH 67.0 09/26/2020   VD25OH 55.0 08/14/2020   Lab Results  Component Value Date   WBC 9.1 11/07/2020   HGB 13.2 11/07/2020   HCT 41.9 11/07/2020   MCV 84.5 11/07/2020   PLT 311 11/07/2020   No results found for: IRON, TIBC, FERRITIN  Attestation Statements:   Reviewed by clinician on day of visit: allergies, medications, problem list, medical history, surgical history, family history, social history, and previous encounter notes.   Wilhemena Durie, am acting as transcriptionist for [employer], DO.  I have reviewed the above documentation for accuracy and completeness, and I agree with the above. Marjory Sneddon, D.O.  [place] was signed into law in 2016 which includes the topic of electronic health records.  This provides immediate access to information in MyChart.  This includes consultation notes, operative notes, office notes, lab results and pathology reports.  If you have any questions about what you read please let us know at your next visit so we can discuss your concerns and take corrective action if need be.  We are right here with you.

## 2021-09-01 ENCOUNTER — Ambulatory Visit (INDEPENDENT_AMBULATORY_CARE_PROVIDER_SITE_OTHER): Payer: 59 | Admitting: Adult Health

## 2021-09-01 ENCOUNTER — Encounter (INDEPENDENT_AMBULATORY_CARE_PROVIDER_SITE_OTHER): Payer: Self-pay | Admitting: Adult Health

## 2021-09-01 ENCOUNTER — Other Ambulatory Visit: Payer: Self-pay

## 2021-09-01 VITALS — BP 124/77 | HR 92 | Temp 98.3°F | Ht 63.0 in | Wt 204.0 lb

## 2021-09-01 DIAGNOSIS — E559 Vitamin D deficiency, unspecified: Secondary | ICD-10-CM | POA: Diagnosis not present

## 2021-09-01 DIAGNOSIS — Z9189 Other specified personal risk factors, not elsewhere classified: Secondary | ICD-10-CM | POA: Diagnosis not present

## 2021-09-01 DIAGNOSIS — Z6841 Body Mass Index (BMI) 40.0 and over, adult: Secondary | ICD-10-CM

## 2021-09-01 DIAGNOSIS — E8881 Metabolic syndrome: Secondary | ICD-10-CM

## 2021-09-01 MED ORDER — VITAMIN D (ERGOCALCIFEROL) 1.25 MG (50000 UNIT) PO CAPS: ORAL_CAPSULE | ORAL | 0 refills | Status: DC

## 2021-09-01 NOTE — Progress Notes (Signed)
Chief Complaint:   OBESITY [name] is here to discuss her progress with her obesity treatment plan along with follow-up of her obesity related diagnoses. [name] is on the Category 2 Plan and keeping a food journal and adhering to recommended goals of 400-500 calories and 30 grams of protein at supper and states she is following her eating plan approximately 75-80% of the time. [name] states she is not exercising at this time.  Today's visit was #: 35 Starting weight: 237 lbs Starting date: 03/19/2020 Today's weight: 204 lbs Today's date: 09/01/2021 Total lbs lost to date: 33 lbs Total lbs lost since last in-office visit: 1 lb  Interim History: Lauren Garza continues to tolerate bi-weekly Ozempic 0.25 mg - down 1 pound since last office visit. Of note:  Plans on re-establishing with Dr. Linton Rump ENT once her insurance changes back to [facility].   She was previously on Medicaid, then [place], currently with [facility].  Subjective:   1. Insulin resistance She is on bi-weekly Ozempic 0.25 mg dose. Unable to tolerate entire 0.5 mg Ozempic - GI upset/headache. She denies mass in neck, dysphagia, dyspepsia, or persistent hoarseness.  2. Vitamin D deficiency Vitamin D level 07/31/2021 - 42.2 - below goal of 50. She is currently taking prescription ergocalciferol 50,000 IU each week. She denies nausea, vomiting or muscle weakness.  3. At risk for nausea Tyshell is at risk for nausea due to GLP-1 therapy for IR.  Assessment/Plan:   1. Insulin resistance Continue bi-weekly Ozempic 0.25 mg.  No need for refill today.  2. Vitamin D deficiency Discussed labs with patient today.  Refill ergocalciferol 50,000 IU once weekly, as per below.  - Refill Vitamin D, Ergocalciferol, (DRISDOL) 1.25 MG (50000 UNIT) CAPS capsule; 1 capsules once per week.  Dispense: 4 capsule; Refill: 0  3. At risk for nausea Lauren Garza was given approximately 15 minutes of nausea prevention  counseling today. Lauren Garza is at risk for nausea due to her new or current medication. She was encouraged to titrate her medication slowly, make sure to stay hydrated, eat smaller portions throughout the day, and avoid high fat meals.   4. Obesity with current BMI of 36.1  [name] is currently in the action stage of change. As such, her goal is to continue with weight loss efforts. She has agreed to the Category 2 Plan and keeping a food journal and adhering to recommended goals of 400-500 calories and 35 grams of protein at supper.   Exercise goals: All adults should avoid inactivity. Some physical activity is better than none, and adults who participate in any amount of physical activity gain some health benefits. NEAT activities.  Stationary bike 10 miles, 2 times per week.  Behavioral modification strategies: increasing lean protein intake, decreasing simple carbohydrates, meal planning and cooking strategies, keeping healthy foods in the home, and planning for success.  Lauren Garza has agreed to follow-up with our clinic in 2 weeks. She was informed of the importance of frequent follow-up visits to maximize her success with intensive lifestyle modifications for her multiple health conditions.   Objective:   Blood pressure 124/77, pulse 92, temperature 98.3 F (36.8 C), height 5\' 3"  (1.6 m), weight 204 lb (92.5 kg), last menstrual period 02/11/2018, SpO2 99 %. Body mass index is 36.14 kg/m.  General: Cooperative, alert, well developed, in no acute distress. HEENT: Conjunctivae and lids unremarkable. Cardiovascular: Regular rhythm.  Lungs: Normal work of breathing. Neurologic: No focal deficits.   Lab Results  Component  Value Date   CREATININE 0.86 07/31/2021   BUN 12 07/31/2021   NA 140 07/31/2021   K 4.8 07/31/2021   CL 102 07/31/2021   CO2 23 07/31/2021   Lab Results  Component Value Date   ALT 12 07/31/2021   AST 15 07/31/2021   ALKPHOS 93 07/31/2021   BILITOT 0.4  07/31/2021   Lab Results  Component Value Date   HGBA1C 5.5 07/31/2021   HGBA1C 5.3 01/27/2021   HGBA1C 5.4 09/26/2020   HGBA1C 5.4 03/19/2020   Lab Results  Component Value Date   INSULIN 7.0 07/31/2021   INSULIN 8.1 01/27/2021   INSULIN 8.2 09/26/2020   INSULIN 8.7 06/19/2020   INSULIN 9.9 03/19/2020   Lab Results  Component Value Date   TSH 1.360 03/19/2020   Lab Results  Component Value Date   CHOL 179 07/31/2021   HDL 53 07/31/2021   LDLCALC 116 (H) 07/31/2021   TRIG 53 07/31/2021   CHOLHDL 3.4 07/31/2021   Lab Results  Component Value Date   VD25OH 42.2 07/31/2021   VD25OH 67.0 09/26/2020   VD25OH 55.0 08/14/2020   Lab Results  Component Value Date   WBC 9.1 11/07/2020   HGB 13.2 11/07/2020   HCT 41.9 11/07/2020   MCV 84.5 11/07/2020   PLT 311 11/07/2020   Attestation Statements:   Reviewed by clinician on day of visit: allergies, medications, problem list, medical history, surgical history, family history, social history, and previous encounter notes.  I, Water quality scientist, CMA, am acting as Location manager for Mina Marble, NP.  I have reviewed the above documentation for accuracy and completeness, and I agree with the above. -  Kerriann Kamphuis d. Reinhart Saulters, NP-C

## 2021-09-08 ENCOUNTER — Other Ambulatory Visit (INDEPENDENT_AMBULATORY_CARE_PROVIDER_SITE_OTHER): Payer: Self-pay | Admitting: Family Medicine

## 2021-09-08 DIAGNOSIS — J3089 Other allergic rhinitis: Secondary | ICD-10-CM

## 2021-09-08 NOTE — Telephone Encounter (Signed)
LAST APPOINTMENT DATE: 09/01/21 NEXT APPOINTMENT DATE: 09/15/21   [id number] [address], [place] [address] [place] [address] Phone: [phone] Fax: [phone]  Patient is requesting a refill of the following medications: Requested Prescriptions   Pending Prescriptions Disp Refills   levocetirizine (XYZAL) 5 MG tablet [Pharmacy Med Name: LEVOCETIRIZINE 5MG  TABLETS] 30 tablet 0    Sig: TAKE 1 TABLET(5 MG) BY MOUTH EVERY EVENING    Date last filled: 08/14/2021 Previously prescribed by Dr. Raliegh Scarlet  Lab Results  Component Value Date   HGBA1C 5.5 07/31/2021   HGBA1C 5.3 01/27/2021   HGBA1C 5.4 09/26/2020   Lab Results  Component Value Date   LDLCALC 116 (H) 07/31/2021   CREATININE 0.86 07/31/2021   Lab Results  Component Value Date   VD25OH 42.2 07/31/2021   VD25OH 67.0 09/26/2020   VD25OH 55.0 08/14/2020    BP Readings from Last 3 Encounters:  09/01/21 124/77  08/14/21 107/71  07/31/21 102/71

## 2021-09-15 ENCOUNTER — Encounter (INDEPENDENT_AMBULATORY_CARE_PROVIDER_SITE_OTHER): Payer: Self-pay | Admitting: Adult Health

## 2021-09-15 ENCOUNTER — Telehealth (INDEPENDENT_AMBULATORY_CARE_PROVIDER_SITE_OTHER): Payer: Self-pay | Admitting: Adult Health

## 2021-09-15 ENCOUNTER — Other Ambulatory Visit: Payer: Self-pay

## 2021-09-15 ENCOUNTER — Ambulatory Visit (INDEPENDENT_AMBULATORY_CARE_PROVIDER_SITE_OTHER): Payer: 59 | Admitting: Adult Health

## 2021-09-15 VITALS — BP 117/77 | HR 74 | Temp 98.8°F | Ht 63.0 in | Wt 201.0 lb

## 2021-09-15 DIAGNOSIS — Z6841 Body Mass Index (BMI) 40.0 and over, adult: Secondary | ICD-10-CM

## 2021-09-15 DIAGNOSIS — Z9189 Other specified personal risk factors, not elsewhere classified: Secondary | ICD-10-CM

## 2021-09-15 DIAGNOSIS — J3089 Other allergic rhinitis: Secondary | ICD-10-CM | POA: Diagnosis not present

## 2021-09-15 DIAGNOSIS — E8881 Metabolic syndrome: Secondary | ICD-10-CM

## 2021-09-15 MED ORDER — OZEMPIC (0.25 OR 0.5 MG/DOSE) 2 MG/1.5ML ~~LOC~~ SOPN: 0.5000 mg | PEN_INJECTOR | SUBCUTANEOUS | 0 refills | Status: DC

## 2021-09-15 MED ORDER — LEVOCETIRIZINE DIHYDROCHLORIDE 5 MG PO TABS: 5.0000 mg | ORAL_TABLET | Freq: Every evening | ORAL | 0 refills | Status: DC

## 2021-09-15 NOTE — Telephone Encounter (Signed)
FYI RX Sig Ozempic 0.5mg  weekly. Pt injects 0.25 on Sunday and 0.25mg  on Thursday. Confirmed with pharmacy.

## 2021-09-15 NOTE — Telephone Encounter (Signed)
[facility], Roselyn Reef needs to know if the instructions for Ozempic for this patient is 2 times a week.  Please call Roselyn Reef at [phone]

## 2021-09-15 NOTE — Progress Notes (Signed)
Chief Complaint:   OBESITY Lauren Garza is here to discuss her progress with her obesity treatment plan along with follow-up of her obesity related diagnoses. Lauren Garza is on the Category 2 Plan and keeping a food journal and adhering to recommended goals of 400-500 calories and 35 grams protein at supper and states she is following her eating plan approximately 80% of the time. Lauren Garza states she is not currently exercising.  Today's visit was #: 67 Starting weight: 237 lbs Starting date: 03/19/2020 Today's weight: 201 lbs Today's date: 09/15/2021 Total lbs lost to date: 36 Total lbs lost since last in-office visit: 3  Interim History: Lauren Garza has experienced 3 deaths in the last 2 weeks:  1. Her 2nd cousin 2. Her Step-brother's mothe 3. He 1st cousin's girlfriend of 16 years.   She reports decreased appetite due to recent loss.  Subjective:   1. Environmental and seasonal allergies Lauren Garza notes continued headache with intermittent nasal drainage. She reports decrease in symptoms with daily Xyzal. She completed recent course of Augmentin for acute sinus infection.  2. Insulin resistance Pt has been spacing out Ozempic 0.5 mg injection- split into two 0.25mg  doses (Sunday, Thursday). The entire 0.5mg  dose caused HA and GI upest. She is able to tolerate the split dose. She denies mass in neck, dysphagia, dyspepsia, or persistent hoarseness.  3. At risk for nausea Lauren Garza is at risk for nausea due to GLP-1 for insulin resistance.  Assessment/Plan:   1. Environmental and seasonal allergies Continue Xyzal 5 mg.  Refill- levocetirizine (XYZAL) 5 MG tablet; Take 1 tablet (5 mg total) by mouth every evening.  Dispense: 30 tablet; Refill: 0  2. Insulin resistance Lauren Garza will continue to work on weight loss, exercise, and decreasing simple carbohydrates to help decrease the risk of diabetes. Lauren Garza agreed to follow-up with Lauren Garza as directed to closely monitor her  progress. Increase water intake.  Refill- Semaglutide,0.25 or 0.5MG /DOS, (OZEMPIC, 0.25 OR 0.5 MG/DOSE,) 2 MG/1.5ML SOPN; Inject 0.5 mg into the skin once a week. Pt is taking 0.25mg  dose on Sunday and 0.25mg  Thursday  Dispense: 2 mL; Refill: 0  3. At risk for nausea Lauren Garza was given approximately 15 minutes of nausea prevention counseling today. Lauren Garza is at risk for nausea due to her new or current medication. She was encouraged to titrate her medication slowly, make sure to stay hydrated, eat smaller portions throughout the day, and avoid high fat meals.    4. Obesity with current BMI of 35.7  Lauren Garza is currently in the action stage of change. As such, her goal is to continue with weight loss efforts. She has agreed to the Category 2 Plan and keeping a food journal and adhering to recommended goals of 400-500 calories and 35 grams protein at supper.   Exercise goals:  Increase daily walking to 5 minutes per day.  Behavioral modification strategies: increasing lean protein intake, decreasing simple carbohydrates, meal planning and cooking strategies, keeping healthy foods in the home, and planning for success.  Senna has agreed to follow-up with our clinic in 2 weeks. She was informed of the importance of frequent follow-up visits to maximize her success with intensive lifestyle modifications for her multiple health conditions.   Objective:   Blood pressure 117/77, pulse 74, temperature 98.8 F (37.1 C), height 5\' 3"  (1.6 m), weight 201 lb (91.2 kg), last menstrual period 02/11/2018, SpO2 96 %. Body mass index is 35.61 kg/m.  General: Cooperative, alert, well developed, in no acute distress. HEENT:  Conjunctivae and lids unremarkable. Cardiovascular: Regular rhythm.  Lungs: Normal work of breathing. Neurologic: No focal deficits.   Lab Results  Component Value Date   CREATININE 0.86 07/31/2021   BUN 12 07/31/2021   NA 140 07/31/2021   K 4.8 07/31/2021   CL  102 07/31/2021   CO2 23 07/31/2021   Lab Results  Component Value Date   ALT 12 07/31/2021   AST 15 07/31/2021   ALKPHOS 93 07/31/2021   BILITOT 0.4 07/31/2021   Lab Results  Component Value Date   HGBA1C 5.5 07/31/2021   HGBA1C 5.3 01/27/2021   HGBA1C 5.4 09/26/2020   HGBA1C 5.4 03/19/2020   Lab Results  Component Value Date   INSULIN 7.0 07/31/2021   INSULIN 8.1 01/27/2021   INSULIN 8.2 09/26/2020   INSULIN 8.7 06/19/2020   INSULIN 9.9 03/19/2020   Lab Results  Component Value Date   TSH 1.360 03/19/2020   Lab Results  Component Value Date   CHOL 179 07/31/2021   HDL 53 07/31/2021   LDLCALC 116 (H) 07/31/2021   TRIG 53 07/31/2021   CHOLHDL 3.4 07/31/2021   Lab Results  Component Value Date   VD25OH 42.2 07/31/2021   VD25OH 67.0 09/26/2020   VD25OH 55.0 08/14/2020   Lab Results  Component Value Date   WBC 9.1 11/07/2020   HGB 13.2 11/07/2020   HCT 41.9 11/07/2020   MCV 84.5 11/07/2020   PLT 311 11/07/2020    Attestation Statements:   Reviewed by clinician on day of visit: allergies, medications, problem list, medical history, surgical history, family history, social history, and previous encounter notes.  Coral Ceo, CMA, am acting as transcriptionist for Mina Marble, NP.  I have reviewed the above documentation for accuracy and completeness, and I agree with the above. -  Atharva Mirsky d. Kelaiah Escalona, NP-C

## 2021-09-29 ENCOUNTER — Encounter (INDEPENDENT_AMBULATORY_CARE_PROVIDER_SITE_OTHER): Payer: Self-pay | Admitting: Adult Health

## 2021-09-29 ENCOUNTER — Other Ambulatory Visit: Payer: Self-pay

## 2021-09-29 ENCOUNTER — Ambulatory Visit (INDEPENDENT_AMBULATORY_CARE_PROVIDER_SITE_OTHER): Payer: 59 | Admitting: Adult Health

## 2021-09-29 VITALS — BP 117/77 | HR 83 | Temp 98.2°F | Ht 63.0 in | Wt 198.0 lb

## 2021-09-29 DIAGNOSIS — Z9189 Other specified personal risk factors, not elsewhere classified: Secondary | ICD-10-CM

## 2021-09-29 DIAGNOSIS — E8881 Metabolic syndrome: Secondary | ICD-10-CM | POA: Diagnosis not present

## 2021-09-29 DIAGNOSIS — E559 Vitamin D deficiency, unspecified: Secondary | ICD-10-CM

## 2021-09-29 DIAGNOSIS — Z6841 Body Mass Index (BMI) 40.0 and over, adult: Secondary | ICD-10-CM

## 2021-09-29 MED ORDER — VITAMIN D (ERGOCALCIFEROL) 1.25 MG (50000 UNIT) PO CAPS: ORAL_CAPSULE | ORAL | 0 refills | Status: DC

## 2021-09-29 NOTE — Progress Notes (Signed)
Chief Complaint:   OBESITY Caidence is here to discuss her progress with her obesity treatment plan along with follow-up of her obesity related diagnoses. Lauren Garza is on the Category 2 Plan and keeping a food journal and adhering to recommended goals of 400-500 calories and 35 grams of protein and states she is following her eating plan approximately 75% of the time. Brooks states she is not exercising regularly at this time.  Today's visit was #: 75 Starting weight: 237 lbs Starting date: 03/19/2020 Today's weight: 198 lbs Today's date: 09/29/2021 Total lbs lost to date: 39 lbs Total lbs lost since last in-office visit: 3 lbs  Interim History: Lauren Garza has lost to <200 - current weight 198 pounds! She had another death - her father's God sister. The frequent loss of family/friends has made it difficult to exercise on a regular basis.  Subjective:   1. Insulin resistance On 07/31/2021, BG 7.5, A1c 5.5, insulin level 7.0. She is on Ozempic 0.25 mg - injection Friday and injection either Tuesday/Wednesday. She denies mass in neck, dysphagia, dyspepsia, or persistent hoarseness. Of note:  Ozempic 0.5 mg once weekly injection caused nausea and headache.  2. Vitamin D deficiency Vitamin D level on 07/31/2021 - 42.2 - just below goal of 50. She is currently taking prescription ergocalciferol 50,000 IU each week. She denies nausea, vomiting or muscle weakness.  3. At risk for osteoporosis Kanaya is at higher risk of osteopenia and osteoporosis due to Vitamin D deficiency and obesity.    Assessment/Plan:   1. Insulin resistance Continue Ozempic as directed.  No need for refill.  2. Vitamin D deficiency Refill ergocalciferol 50,000 IU once weekly.  - Refill Vitamin D, Ergocalciferol, (DRISDOL) 1.25 MG (50000 UNIT) CAPS capsule; 1 capsules once per week.  Dispense: 4 capsule; Refill: 0  3. At risk for osteoporosis Lauren Garza was given approximately 15 minutes of  osteoporosis prevention counseling today. Lauren Garza is at risk for osteopenia and osteoporosis due to her Vitamin D deficiency. She was encouraged to take her Vitamin D and follow her higher calcium diet and increase strengthening exercise to help strengthen her bones and decrease her risk of osteopenia and osteoporosis.  Repetitive spaced learning was employed today to elicit superior memory formation and behavioral change.  4. Obesity with current BMI of 35.2  Lauren Garza is currently in the action stage of change. As such, her goal is to continue with weight loss efforts. She has agreed to the Category 2 Plan and keeping a food journal and adhering to recommended goals of 400-500 calories and 35 grams of protein at supper.   Exercise goals:  Stationary bike or walk for 10 minutes 2 times per week.  Behavioral modification strategies: increasing lean protein intake, decreasing simple carbohydrates, meal planning and cooking strategies, keeping healthy foods in the home, and planning for success.  Lauren Garza has agreed to follow-up with our clinic in 2-3 weeks. She was informed of the importance of frequent follow-up visits to maximize her success with intensive lifestyle modifications for her multiple health conditions.   Objective:   Blood pressure 117/77, pulse 83, temperature 98.2 F (36.8 C), height 5\' 3"  (1.6 m), weight 198 lb (89.8 kg), last menstrual period 02/11/2018, SpO2 99 %. Body mass index is 35.07 kg/m.  General: Cooperative, alert, well developed, in no acute distress. HEENT: Conjunctivae and lids unremarkable. Cardiovascular: Regular rhythm.  Lungs: Normal work of breathing. Neurologic: No focal deficits.   Lab Results  Component Value Date   CREATININE  0.86 07/31/2021   BUN 12 07/31/2021   NA 140 07/31/2021   K 4.8 07/31/2021   CL 102 07/31/2021   CO2 23 07/31/2021   Lab Results  Component Value Date   ALT 12 07/31/2021   AST 15 07/31/2021   ALKPHOS 93  07/31/2021   BILITOT 0.4 07/31/2021   Lab Results  Component Value Date   HGBA1C 5.5 07/31/2021   HGBA1C 5.3 01/27/2021   HGBA1C 5.4 09/26/2020   HGBA1C 5.4 03/19/2020   Lab Results  Component Value Date   INSULIN 7.0 07/31/2021   INSULIN 8.1 01/27/2021   INSULIN 8.2 09/26/2020   INSULIN 8.7 06/19/2020   INSULIN 9.9 03/19/2020   Lab Results  Component Value Date   TSH 1.360 03/19/2020   Lab Results  Component Value Date   CHOL 179 07/31/2021   HDL 53 07/31/2021   LDLCALC 116 (H) 07/31/2021   TRIG 53 07/31/2021   CHOLHDL 3.4 07/31/2021   Lab Results  Component Value Date   VD25OH 42.2 07/31/2021   VD25OH 67.0 09/26/2020   VD25OH 55.0 08/14/2020   Lab Results  Component Value Date   WBC 9.1 11/07/2020   HGB 13.2 11/07/2020   HCT 41.9 11/07/2020   MCV 84.5 11/07/2020   PLT 311 11/07/2020   Attestation Statements:   Reviewed by clinician on day of visit: allergies, medications, problem list, medical history, surgical history, family history, social history, and previous encounter notes.  I, Water quality scientist, CMA, am acting as Location manager for Mina Marble, NP.  I have reviewed the above documentation for accuracy and completeness, and I agree with the above. -  Essie Lagunes d. Adryana Mogensen, NP-C

## 2021-10-08 ENCOUNTER — Other Ambulatory Visit (INDEPENDENT_AMBULATORY_CARE_PROVIDER_SITE_OTHER): Payer: Self-pay | Admitting: Adult Health

## 2021-10-08 DIAGNOSIS — J3089 Other allergic rhinitis: Secondary | ICD-10-CM

## 2021-10-08 NOTE — Telephone Encounter (Signed)
Pt last seen by Katy Danford, FNP.  

## 2021-10-13 ENCOUNTER — Encounter (INDEPENDENT_AMBULATORY_CARE_PROVIDER_SITE_OTHER): Payer: Self-pay | Admitting: Adult Health

## 2021-10-13 ENCOUNTER — Other Ambulatory Visit: Payer: Self-pay

## 2021-10-13 ENCOUNTER — Ambulatory Visit (INDEPENDENT_AMBULATORY_CARE_PROVIDER_SITE_OTHER): Payer: 59 | Admitting: Adult Health

## 2021-10-13 VITALS — BP 124/80 | HR 86 | Temp 98.8°F | Ht 63.0 in | Wt 200.0 lb

## 2021-10-13 DIAGNOSIS — E8881 Metabolic syndrome: Secondary | ICD-10-CM

## 2021-10-13 DIAGNOSIS — Z6841 Body Mass Index (BMI) 40.0 and over, adult: Secondary | ICD-10-CM

## 2021-10-13 DIAGNOSIS — E559 Vitamin D deficiency, unspecified: Secondary | ICD-10-CM

## 2021-10-13 DIAGNOSIS — Z9189 Other specified personal risk factors, not elsewhere classified: Secondary | ICD-10-CM

## 2021-10-13 DIAGNOSIS — E88819 Insulin resistance, unspecified: Secondary | ICD-10-CM

## 2021-10-13 MED ORDER — OZEMPIC (0.25 OR 0.5 MG/DOSE) 2 MG/1.5ML ~~LOC~~ SOPN: 0.5000 mg | PEN_INJECTOR | SUBCUTANEOUS | 0 refills | Status: DC

## 2021-10-13 NOTE — Progress Notes (Signed)
Chief Complaint:   OBESITY Lauren Garza is here to discuss her progress with her obesity treatment plan along with follow-up of her obesity related diagnoses. Lauren Garza is on the Category 2 Plan and keeping a food journal and adhering to recommended goals of 400-500 calories and 35 grams of protein at supper and states she is following her eating plan approximately 75% of the time. Lauren Garza states she is not exercising regularly at this time.  Today's visit was #: 58 Starting weight: 237 lbs Starting date: 03/19/2020 Today's weight: 200 lbs Today's date: 10/13/2021 Total lbs lost to date: 37 lbs Total lbs lost since last in-office visit: 0  Interim History: Lauren Garza is on Ozempic 0.5 mg - splits dose to 0.25 mg injection on Sunday and Wednesday.  Subjective:   1. Vitamin D deficiency On 07/31/2021, vitamin D level - 42.2 - stable, yet below goal of 50. She is currently taking prescription ergocalciferol 50,000 IU each week. She denies nausea, vomiting or muscle weakness.  2. Insulin resistance Ozempic 0.5 mg - split dose Sunday/Thursday 0.25 mg. She denies mass in neck, dysphagia, dyspepsia, persistent hoarseness or GI upset.  3. At risk for nausea Lauren Garza is at risk for nausea due to taking Ozempic for IR.  Assessment/Plan:   1. Vitamin D deficiency Continue ergocalciferol.  No need for refill.  2. Insulin resistance Refill Ozempic 0.5 mg - inject 0.25 mg Sunday/Thursday, for a total of 0.5mg  per week.  - Refill Semaglutide,0.25 or 0.5MG /DOS, (OZEMPIC, 0.25 OR 0.5 MG/DOSE,) 2 MG/1.5ML SOPN; Inject 0.5 mg into the skin once a week. Pt is taking 0.25mg  dose on Sunday and 0.25mg  Thursday  Dispense: 2 mL; Refill: 0  3. At risk for nausea Lauren Garza was given approximately 15 minutes of nausea prevention counseling today. Lauren Garza is at risk for nausea due to her new or current medication. She was encouraged to titrate her medication slowly, make sure to stay  hydrated, eat smaller portions throughout the day, and avoid high fat meals.   4. Obesity with current BMI of 35.6  Lauren Garza is currently in the action stage of change. As such, her goal is to continue with weight loss efforts. She has agreed to the Category 2 Plan and keeping a food journal and adhering to recommended goals of 400-500 calories and 35 grams of protein at supper.   Handout:  Thanksgiving Strategies.  Maintain Weight - Birthday, Thanksgiving.  Exercise goals:  As is.  Behavioral modification strategies: increasing lean protein intake, decreasing simple carbohydrates, meal planning and cooking strategies, keeping healthy foods in the home, and planning for success.  Lauren Garza has agreed to follow-up with our clinic in 2 weeks. She was informed of the importance of frequent follow-up visits to maximize her success with intensive lifestyle modifications for her multiple health conditions.   Objective:   Blood pressure 124/80, pulse 86, temperature 98.8 F (37.1 C), height 5\' 3"  (1.6 m), weight 200 lb (90.7 kg), last menstrual period 02/11/2018, SpO2 98 %. Body mass index is 35.43 kg/m.  General: Cooperative, alert, well developed, in no acute distress. HEENT: Conjunctivae and lids unremarkable. Cardiovascular: Regular rhythm.  Lungs: Normal work of breathing. Neurologic: No focal deficits.   Lab Results  Component Value Date   CREATININE 0.86 07/31/2021   BUN 12 07/31/2021   NA 140 07/31/2021   K 4.8 07/31/2021   CL 102 07/31/2021   CO2 23 07/31/2021   Lab Results  Component Value Date   ALT 12 07/31/2021  AST 15 07/31/2021   ALKPHOS 93 07/31/2021   BILITOT 0.4 07/31/2021   Lab Results  Component Value Date   HGBA1C 5.5 07/31/2021   HGBA1C 5.3 01/27/2021   HGBA1C 5.4 09/26/2020   HGBA1C 5.4 03/19/2020   Lab Results  Component Value Date   INSULIN 7.0 07/31/2021   INSULIN 8.1 01/27/2021   INSULIN 8.2 09/26/2020   INSULIN 8.7 06/19/2020   INSULIN  9.9 03/19/2020   Lab Results  Component Value Date   TSH 1.360 03/19/2020   Lab Results  Component Value Date   CHOL 179 07/31/2021   HDL 53 07/31/2021   LDLCALC 116 (H) 07/31/2021   TRIG 53 07/31/2021   CHOLHDL 3.4 07/31/2021   Lab Results  Component Value Date   VD25OH 42.2 07/31/2021   VD25OH 67.0 09/26/2020   VD25OH 55.0 08/14/2020   Lab Results  Component Value Date   WBC 9.1 11/07/2020   HGB 13.2 11/07/2020   HCT 41.9 11/07/2020   MCV 84.5 11/07/2020   PLT 311 11/07/2020   Attestation Statements:   Reviewed by clinician on day of visit: allergies, medications, problem list, medical history, surgical history, family history, social history, and previous encounter notes.  I, Water quality scientist, CMA, am acting as Location manager for Mina Marble, NP.  I have reviewed the above documentation for accuracy and completeness, and I agree with the above. -  Breyon Blass d. Joee Iovine, NP-C

## 2021-10-15 ENCOUNTER — Encounter (INDEPENDENT_AMBULATORY_CARE_PROVIDER_SITE_OTHER): Payer: Self-pay | Admitting: Adult Health

## 2021-10-15 ENCOUNTER — Other Ambulatory Visit (HOSPITAL_COMMUNITY): Payer: Self-pay

## 2021-10-15 ENCOUNTER — Other Ambulatory Visit (INDEPENDENT_AMBULATORY_CARE_PROVIDER_SITE_OTHER): Payer: Self-pay

## 2021-10-15 DIAGNOSIS — E8881 Metabolic syndrome: Secondary | ICD-10-CM

## 2021-10-15 MED ORDER — OZEMPIC (0.25 OR 0.5 MG/DOSE) 2 MG/1.5ML ~~LOC~~ SOPN
0.5000 mg | PEN_INJECTOR | SUBCUTANEOUS | 0 refills | Status: DC
  Filled 2021-10-15: qty 1.5, 28d supply, fill #0

## 2021-10-15 NOTE — Telephone Encounter (Signed)
Original rx canceled at [facility] and transferred to [facility] at pt's request.

## 2021-10-15 NOTE — Telephone Encounter (Signed)
Pt last seen by Katy Danford, FNP.  

## 2021-10-18 ENCOUNTER — Other Ambulatory Visit (HOSPITAL_COMMUNITY): Payer: Self-pay

## 2021-10-28 ENCOUNTER — Ambulatory Visit (INDEPENDENT_AMBULATORY_CARE_PROVIDER_SITE_OTHER): Payer: 59 | Admitting: Adult Health

## 2021-10-28 ENCOUNTER — Encounter (INDEPENDENT_AMBULATORY_CARE_PROVIDER_SITE_OTHER): Payer: Self-pay | Admitting: Adult Health

## 2021-10-28 ENCOUNTER — Other Ambulatory Visit: Payer: Self-pay

## 2021-10-28 VITALS — BP 138/73 | HR 84 | Temp 98.5°F | Ht 63.0 in | Wt 197.0 lb

## 2021-10-28 DIAGNOSIS — Z6841 Body Mass Index (BMI) 40.0 and over, adult: Secondary | ICD-10-CM

## 2021-10-28 DIAGNOSIS — E8881 Metabolic syndrome: Secondary | ICD-10-CM

## 2021-10-28 DIAGNOSIS — L853 Xerosis cutis: Secondary | ICD-10-CM | POA: Diagnosis not present

## 2021-10-28 MED ORDER — OZEMPIC (0.25 OR 0.5 MG/DOSE) 2 MG/1.5ML ~~LOC~~ SOPN: PEN_INJECTOR | SUBCUTANEOUS | 0 refills | Status: DC

## 2021-10-28 NOTE — Progress Notes (Signed)
Chief Complaint:   OBESITY Lauren Garza is here to discuss her progress with her obesity treatment plan along with follow-up of her obesity related diagnoses. Lauren Garza is on the Category 2 Plan and keeping a food journal and adhering to recommended goals of 400-500 calories and 35 grams of protein at supper and states she is following her eating plan approximately not exercising regularly.  Today's visit was #: 5 Starting weight: 237 lbs Starting date: 03/19/2020 Today's weight: 197 lbs Today's date: 10/28/2021 Total lbs lost to date: 40 lbs Total lbs lost since last in-office visit: 3 lbs  Interim History:  Lauren Garza is on 0.5 mg - splits dose at 0.25 mg on Sunday and Thursday. She denies any SE with GLP-1 therapy the last month. She reports profound polyphagia is she delays spilt injection from Sunday/Thursday schedule.  Subjective:   1. Insulin resistance Insulin level - highest 9.9 on 03/19/2020 Most current on 07/31/2021 - 7.0 with normal BG of 75, A1c of 5.5. Lauren Garza is on 0.5 mg - splits dose at 0.25 mg on Sunday and Thursday. She denies any SE with GLP-1 therapy the last month. She reports profound polyphagia is she delays spilt injection from Sunday/Thursday schedule.  2. Dry skin For 1 week - dry, itchy skin noted bilaterally on triceps. She showers twice a week.  Assessment/Plan:   1. Insulin resistance Hold Thursday 0.25mg  dose.  Next Sunday, inject 0.5 mg - monitor for side effects. If tolerating 0.5 mg dose, consider titration at next office visit.  - Refill Semaglutide,0.25 or 0.5MG /DOS, (OZEMPIC, 0.25 OR 0.5 MG/DOSE,) 2 MG/1.5ML SOPN; Inject 0.5mg  on Sunday  Dispense: 2 mL; Refill: 0  2. Dry skin Remain hydrated, apply Eucerin in the morning and hydrocortisone cream at night to affected areas.  3. Obesity with current BMI of 35.0  Lauren Garza is currently in the action stage of change. As such, her goal is to continue with weight loss efforts. She has  agreed to the Category 2 Plan and keeping a food journal and adhering to recommended goals of 400-500 calories and 35 grams of protein at supper.   Exercise goals:  Increase daily walking.  Behavioral modification strategies: increasing lean protein intake, decreasing simple carbohydrates, meal planning and cooking strategies, keeping healthy foods in the home, and planning for success.  Lauren Garza has agreed to follow-up with our clinic in 2-3 weeks. She was informed of the importance of frequent follow-up visits to maximize her success with intensive lifestyle modifications for her multiple health conditions.   Objective:   Blood pressure 138/73, pulse 84, temperature 98.5 F (36.9 C), height 5\' 3"  (1.6 m), weight 197 lb (89.4 kg), last menstrual period 02/11/2018, SpO2 96 %. Body mass index is 34.9 kg/m.  General: Cooperative, alert, well developed, in no acute distress. HEENT: Conjunctivae and lids unremarkable. Cardiovascular: Regular rhythm.  Lungs: Normal work of breathing. Neurologic: No focal deficits.   Lab Results  Component Value Date   CREATININE 0.86 07/31/2021   BUN 12 07/31/2021   NA 140 07/31/2021   K 4.8 07/31/2021   CL 102 07/31/2021   CO2 23 07/31/2021   Lab Results  Component Value Date   ALT 12 07/31/2021   AST 15 07/31/2021   ALKPHOS 93 07/31/2021   BILITOT 0.4 07/31/2021   Lab Results  Component Value Date   HGBA1C 5.5 07/31/2021   HGBA1C 5.3 01/27/2021   HGBA1C 5.4 09/26/2020   HGBA1C 5.4 03/19/2020   Lab Results  Component Value Date  INSULIN 7.0 07/31/2021   INSULIN 8.1 01/27/2021   INSULIN 8.2 09/26/2020   INSULIN 8.7 06/19/2020   INSULIN 9.9 03/19/2020   Lab Results  Component Value Date   TSH 1.360 03/19/2020   Lab Results  Component Value Date   CHOL 179 07/31/2021   HDL 53 07/31/2021   LDLCALC 116 (H) 07/31/2021   TRIG 53 07/31/2021   CHOLHDL 3.4 07/31/2021   Lab Results  Component Value Date   VD25OH 42.2 07/31/2021    VD25OH 67.0 09/26/2020   VD25OH 55.0 08/14/2020   Lab Results  Component Value Date   WBC 9.1 11/07/2020   HGB 13.2 11/07/2020   HCT 41.9 11/07/2020   MCV 84.5 11/07/2020   PLT 311 11/07/2020   Attestation Statements:   Reviewed by clinician on day of visit: allergies, medications, problem list, medical history, surgical history, family history, social history, and previous encounter notes.  Time spent on visit including pre-visit chart review and post-visit care and charting was 30 minutes.   I, Water quality scientist, CMA, am acting as Location manager for Mina Marble, NP.  I have reviewed the above documentation for accuracy and completeness, and I agree with the above. -  Georgetta Crafton d. Shaun Runyon, NP-C

## 2021-10-31 ENCOUNTER — Other Ambulatory Visit (INDEPENDENT_AMBULATORY_CARE_PROVIDER_SITE_OTHER): Payer: Self-pay | Admitting: Adult Health

## 2021-10-31 DIAGNOSIS — E559 Vitamin D deficiency, unspecified: Secondary | ICD-10-CM

## 2021-11-03 ENCOUNTER — Other Ambulatory Visit (INDEPENDENT_AMBULATORY_CARE_PROVIDER_SITE_OTHER): Payer: Self-pay | Admitting: Adult Health

## 2021-11-03 DIAGNOSIS — E559 Vitamin D deficiency, unspecified: Secondary | ICD-10-CM

## 2021-11-03 DIAGNOSIS — J3089 Other allergic rhinitis: Secondary | ICD-10-CM

## 2021-11-03 MED ORDER — LEVOCETIRIZINE DIHYDROCHLORIDE 5 MG PO TABS: 5.0000 mg | ORAL_TABLET | Freq: Every evening | ORAL | 0 refills | Status: DC

## 2021-11-03 MED ORDER — VITAMIN D (ERGOCALCIFEROL) 1.25 MG (50000 UNIT) PO CAPS: ORAL_CAPSULE | ORAL | 0 refills | Status: DC

## 2021-11-03 NOTE — Telephone Encounter (Signed)
LAST APPOINTMENT DATE: 10/28/21 NEXT APPOINTMENT DATE: 11/11/21   [facility] #[id number] [address], [address] AT [address] [place] [address] Phone: [phone] Fax: [place] [address] Phone: [phone] Fax: [phone]  Patient is requesting a refill of the following medications: Requested Prescriptions   Pending Prescriptions Disp Refills   levocetirizine (XYZAL) 5 MG tablet 30 tablet 0    Sig: Take 1 tablet (5 mg total) by mouth every evening.   Vitamin D, Ergocalciferol, (DRISDOL) 1.25 MG (50000 UNIT) CAPS capsule 4 capsule 0    Sig: 1 capsules once per week.    Date last filled: xyzal-09/15/21 and vit d-09/29/21 Previously prescribed by [facility]  Lab Results  Component Value Date   HGBA1C 5.5 07/31/2021   HGBA1C 5.3 01/27/2021   HGBA1C 5.4 09/26/2020   Lab Results  Component Value Date   LDLCALC 116 (H) 07/31/2021   CREATININE 0.86 07/31/2021   Lab Results  Component Value Date   VD25OH 42.2 07/31/2021   VD25OH 67.0 09/26/2020   VD25OH 55.0 08/14/2020    BP Readings from Last 3 Encounters:  10/28/21 138/73  10/13/21 124/80  09/29/21 117/77

## 2021-11-11 ENCOUNTER — Ambulatory Visit (INDEPENDENT_AMBULATORY_CARE_PROVIDER_SITE_OTHER): Payer: 59 | Admitting: Adult Health

## 2021-11-12 ENCOUNTER — Telehealth: Payer: Self-pay

## 2021-11-12 ENCOUNTER — Other Ambulatory Visit: Payer: Self-pay

## 2021-11-12 ENCOUNTER — Ambulatory Visit (INDEPENDENT_AMBULATORY_CARE_PROVIDER_SITE_OTHER): Payer: 59 | Admitting: Adult Health

## 2021-11-12 ENCOUNTER — Encounter (INDEPENDENT_AMBULATORY_CARE_PROVIDER_SITE_OTHER): Payer: Self-pay | Admitting: Adult Health

## 2021-11-12 VITALS — BP 101/68 | HR 62 | Temp 98.1°F | Ht 63.0 in | Wt 195.0 lb

## 2021-11-12 DIAGNOSIS — E8881 Metabolic syndrome: Secondary | ICD-10-CM

## 2021-11-12 DIAGNOSIS — Z6841 Body Mass Index (BMI) 40.0 and over, adult: Secondary | ICD-10-CM | POA: Diagnosis not present

## 2021-11-12 NOTE — Telephone Encounter (Signed)
Pt called into the office and would like to set up an apt with Dr. Ernestina Patches to get some injections.   Please advise

## 2021-11-12 NOTE — Progress Notes (Signed)
Chief Complaint:   OBESITY Jelene is here to discuss her progress with her obesity treatment plan along with follow-up of her obesity related diagnoses. Joelle is on the Category 2 Plan and keeping a food journal and adhering to recommended goals of 400-500 calories and 35 grams of protein at supper and states she is following her eating plan approximately 75% of the time. Zetha states she is biking and playing with her child for 10 minutes 3 times per week.  Today's visit was #: [id number] Starting weight: 237 lbs Starting date: 03/19/2020  Today's weight: 195 lbs Today's date: 11/12/2021 Total lbs lost to date: 42 lbs Total lbs lost since last in-office visit: 2 lbs  Interim History:  Jynesis has been able to tolerate the entire 0.5 mg Ozempic dose - Monday injection. Reviewed bioimpedance results - visceral adipose rating 10 - at goal.   MM > FM.  106 lbs > 83 lbs.  Subjective:   1. Insulin resistance Kemaya has been able to tolerate the entire 0.5 mg Ozempic dose - Monday injection. She denies mass in neck, dysphagia, dyspepsia, persistent hoarseness, or GI upset. She is able to consume all foods on plan with current dose of GLP-1 therapy.  Assessment/Plan:   1. Insulin resistance Continue Ozempic 0.5 mg.  No need for refill.  2. Obesity with current BMI of 34.6  Rosalind is currently in the action stage of change. As such, her goal is to continue with weight loss efforts. She has agreed to the Category 2 Plan and keeping a food journal and adhering to recommended goals of 400-500 calories and 35 grams of protein at supper.   Exercise goals:  As is.  Behavioral modification strategies: increasing lean protein intake, decreasing simple carbohydrates, meal planning and cooking strategies, keeping healthy foods in the home, and planning for success.  Shasha has agreed to follow-up with our clinic in 3 weeks. She was informed of the importance of frequent  follow-up visits to maximize her success with intensive lifestyle modifications for her multiple health conditions.   Objective:   Blood pressure 101/68, pulse 62, temperature 98.1 F (36.7 C), height 5\' 3"  (1.6 m), weight 195 lb (88.5 kg), last menstrual period 02/11/2018, SpO2 98 %. Body mass index is 34.54 kg/m.  General: Cooperative, alert, well developed, in no acute distress. HEENT: Conjunctivae and lids unremarkable. Cardiovascular: Regular rhythm.  Lungs: Normal work of breathing. Neurologic: No focal deficits.   Lab Results  Component Value Date   CREATININE 0.86 07/31/2021   BUN 12 07/31/2021   NA 140 07/31/2021   K 4.8 07/31/2021   CL 102 07/31/2021   CO2 23 07/31/2021   Lab Results  Component Value Date   ALT 12 07/31/2021   AST 15 07/31/2021   ALKPHOS 93 07/31/2021   BILITOT 0.4 07/31/2021   Lab Results  Component Value Date   HGBA1C 5.5 07/31/2021   HGBA1C 5.3 01/27/2021   HGBA1C 5.4 09/26/2020   HGBA1C 5.4 03/19/2020   Lab Results  Component Value Date   INSULIN 7.0 07/31/2021   INSULIN 8.1 01/27/2021   INSULIN 8.2 09/26/2020   INSULIN 8.7 06/19/2020   INSULIN 9.9 03/19/2020   Lab Results  Component Value Date   TSH 1.360 03/19/2020   Lab Results  Component Value Date   CHOL 179 07/31/2021   HDL 53 07/31/2021   LDLCALC 116 (H) 07/31/2021   TRIG 53 07/31/2021   CHOLHDL 3.4 07/31/2021   Lab Results  Component  Value Date   VD25OH 42.2 07/31/2021   VD25OH 67.0 09/26/2020   VD25OH 55.0 08/14/2020   Lab Results  Component Value Date   WBC 9.1 11/07/2020   HGB 13.2 11/07/2020   HCT 41.9 11/07/2020   MCV 84.5 11/07/2020   PLT 311 11/07/2020   Attestation Statements:   Reviewed by clinician on day of visit: allergies, medications, problem list, medical history, surgical history, family history, social history, and previous encounter notes.  Time spent on visit including pre-visit chart review and post-visit care and charting was 29  minutes.   I, Water quality scientist, CMA, am acting as Location manager for Mina Marble, NP.  I have reviewed the above documentation for accuracy and completeness, and I agree with the above. -  Philopater Mucha d. Salbador Fiveash, NP-C

## 2021-11-13 ENCOUNTER — Other Ambulatory Visit: Payer: Self-pay | Admitting: Family Medicine

## 2021-11-13 DIAGNOSIS — M79604 Pain in right leg: Secondary | ICD-10-CM

## 2021-11-14 ENCOUNTER — Ambulatory Visit
Admission: RE | Admit: 2021-11-14 | Discharge: 2021-11-14 | Disposition: A | Discharge status: Home / Self Care | Payer: 59 | Source: Ambulatory Visit | Attending: Family Medicine | Admitting: Family Medicine

## 2021-11-14 DIAGNOSIS — M79604 Pain in right leg: Secondary | ICD-10-CM

## 2021-11-27 ENCOUNTER — Other Ambulatory Visit (INDEPENDENT_AMBULATORY_CARE_PROVIDER_SITE_OTHER): Payer: Self-pay | Admitting: Adult Health

## 2021-11-27 DIAGNOSIS — J3089 Other allergic rhinitis: Secondary | ICD-10-CM

## 2021-12-03 ENCOUNTER — Encounter (INDEPENDENT_AMBULATORY_CARE_PROVIDER_SITE_OTHER): Payer: Self-pay | Admitting: Adult Health

## 2021-12-03 ENCOUNTER — Other Ambulatory Visit: Payer: Self-pay

## 2021-12-03 ENCOUNTER — Ambulatory Visit (INDEPENDENT_AMBULATORY_CARE_PROVIDER_SITE_OTHER): Payer: 59 | Admitting: Adult Health

## 2021-12-03 VITALS — BP 103/68 | HR 72 | Temp 98.9°F | Ht 63.0 in | Wt 194.0 lb

## 2021-12-03 DIAGNOSIS — E559 Vitamin D deficiency, unspecified: Secondary | ICD-10-CM

## 2021-12-03 DIAGNOSIS — E8881 Metabolic syndrome: Secondary | ICD-10-CM

## 2021-12-03 DIAGNOSIS — Z6841 Body Mass Index (BMI) 40.0 and over, adult: Secondary | ICD-10-CM

## 2021-12-03 MED ORDER — OZEMPIC (0.25 OR 0.5 MG/DOSE) 2 MG/1.5ML ~~LOC~~ SOPN: PEN_INJECTOR | SUBCUTANEOUS | 0 refills | Status: DC

## 2021-12-03 MED ORDER — VITAMIN D (ERGOCALCIFEROL) 1.25 MG (50000 UNIT) PO CAPS: ORAL_CAPSULE | ORAL | 0 refills | Status: DC

## 2021-12-03 NOTE — Progress Notes (Signed)
Chief Complaint:   OBESITY Lauren Garza is here to discuss her progress with her obesity treatment plan along with follow-up of her obesity related diagnoses. Lauren Garza is on the Category 2 Plan and keeping a food journal and adhering to recommended goals of 400-500 calories and 30 grams of protein at supper and states she is following her eating plan approximately 60% of the time. Lauren Garza states she is biking for 15 minutes 1 time per week.  Today's visit was #: 44 Starting weight: 237 lbs Starting date: 03/19/2020 Today's weight: 194 lbs Today's date: 12/03/2021 Total lbs lost to date: 43 lbs Total lbs lost since last in-office visit: 1 lb  Interim History:  Lauren Garza is on Ozempic 0.5 mg  She continues to be able to tolerated the 0.5mg  dose in a single injection.  Subjective:   1. Insulin resistance She injects Ozempic 0.5 mg on Tuesday - 3 doses at this strength. Lauren Garza is on Ozempic 0.5 mg  She continues to be able to tolerated the 0.5mg  dose in a single injection. She previously had to split the dose into two 0.25mg  injections over 7 days. She denies mass in neck, dysphagia, dyspepsia, persistent hoarseness, or GI upset.  2. Vitamin D deficiency On 07/31/2021, vitamin D level - 42.2 - below goal of 50. She is currently taking prescription ergocalciferol 50,000 IU each week. She denies nausea, vomiting or muscle weakness.  Assessment/Plan:   1. Insulin resistance Check labs at next office visit. Refill Ozempic 0.5 mg once weekly, as per below.  - Refill Semaglutide,0.25 or 0.5MG /DOS, (OZEMPIC, 0.25 OR 0.5 MG/DOSE,) 2 MG/1.5ML SOPN; Inject 0.5mg  on Sunday  Dispense: 2 mL; Refill: 0  2. Vitamin D deficiency Check labs at next office visit. Refill ergocalciferol 50,000 IU once weekly.  - Refill Vitamin D, Ergocalciferol, (DRISDOL) 1.25 MG (50000 UNIT) CAPS capsule; 1 capsules once per week.  Dispense: 4 capsule; Refill: 0  3. Obesity with current BMI of  34.4  Lauren Garza is currently in the action stage of change. As such, her goal is to continue with weight loss efforts. She has agreed to the Category 2 Plan and keeping a food journal and adhering to recommended goals of 400-500 calories and 35 grams of protein at supper.   Exercise goals:  As is.  Behavioral modification strategies: increasing lean protein intake, decreasing simple carbohydrates, meal planning and cooking strategies, keeping healthy foods in the home, and planning for success.  Lauren Garza has agreed to follow-up with our clinic in 3 weeks. She was informed of the importance of frequent follow-up visits to maximize her success with intensive lifestyle modifications for her multiple health conditions.   Objective:   Blood pressure 103/68, pulse 72, temperature 98.9 F (37.2 C), height 5\' 3"  (1.6 m), weight 194 lb (88 kg), last menstrual period 02/11/2018, SpO2 100 %. Body mass index is 34.37 kg/m.  General: Cooperative, alert, well developed, in no acute distress. HEENT: Conjunctivae and lids unremarkable. Cardiovascular: Regular rhythm.  Lungs: Normal work of breathing. Neurologic: No focal deficits.   Lab Results  Component Value Date   CREATININE 0.86 07/31/2021   BUN 12 07/31/2021   NA 140 07/31/2021   K 4.8 07/31/2021   CL 102 07/31/2021   CO2 23 07/31/2021   Lab Results  Component Value Date   ALT 12 07/31/2021   AST 15 07/31/2021   ALKPHOS 93 07/31/2021   BILITOT 0.4 07/31/2021   Lab Results  Component Value Date   HGBA1C 5.5 07/31/2021  HGBA1C 5.3 01/27/2021   HGBA1C 5.4 09/26/2020   HGBA1C 5.4 03/19/2020   Lab Results  Component Value Date   INSULIN 7.0 07/31/2021   INSULIN 8.1 01/27/2021   INSULIN 8.2 09/26/2020   INSULIN 8.7 06/19/2020   INSULIN 9.9 03/19/2020   Lab Results  Component Value Date   TSH 1.360 03/19/2020   Lab Results  Component Value Date   CHOL 179 07/31/2021   HDL 53 07/31/2021   LDLCALC 116 (H) 07/31/2021    TRIG 53 07/31/2021   CHOLHDL 3.4 07/31/2021   Lab Results  Component Value Date   VD25OH 42.2 07/31/2021   VD25OH 67.0 09/26/2020   VD25OH 55.0 08/14/2020   Lab Results  Component Value Date   WBC 9.1 11/07/2020   HGB 13.2 11/07/2020   HCT 41.9 11/07/2020   MCV 84.5 11/07/2020   PLT 311 11/07/2020   Attestation Statements:   Reviewed by clinician on day of visit: allergies, medications, problem list, medical history, surgical history, family history, social history, and previous encounter notes.  Time spent on visit including pre-visit chart review and post-visit care and charting was 28 minutes.   I, Water quality scientist, CMA, am acting as Location manager for Mina Marble, NP.  I have reviewed the above documentation for accuracy and completeness, and I agree with the above. -  Efosa Treichler d. Ger Nicks, NP-C

## 2021-12-08 ENCOUNTER — Encounter (INDEPENDENT_AMBULATORY_CARE_PROVIDER_SITE_OTHER): Payer: Self-pay

## 2021-12-09 ENCOUNTER — Ambulatory Visit: Payer: Self-pay

## 2021-12-09 ENCOUNTER — Other Ambulatory Visit: Payer: Self-pay

## 2021-12-09 ENCOUNTER — Ambulatory Visit (INDEPENDENT_AMBULATORY_CARE_PROVIDER_SITE_OTHER): Payer: 59 | Admitting: Physical Medicine and Rehabilitation

## 2021-12-09 ENCOUNTER — Encounter: Payer: Self-pay | Admitting: Physical Medicine and Rehabilitation

## 2021-12-09 DIAGNOSIS — M7062 Trochanteric bursitis, left hip: Secondary | ICD-10-CM | POA: Diagnosis not present

## 2021-12-09 DIAGNOSIS — M7061 Trochanteric bursitis, right hip: Secondary | ICD-10-CM

## 2021-12-09 NOTE — Progress Notes (Signed)
Pt state both hip pain. Pt state standing for a longtime and sitting makes the pain worse. Pt state she takes over the counter pain meds to help ease her pain.  Numeric Pain Rating Scale and Functional Assessment Average Pain 5   In the last MONTH (on 0-10 scale) has pain interfered with the following?  1. General activity like being  able to carry out your everyday physical activities such as walking, climbing stairs, carrying groceries, or moving a chair?  Rating(10)    -BT, -Dye Allergies.

## 2021-12-24 ENCOUNTER — Encounter (INDEPENDENT_AMBULATORY_CARE_PROVIDER_SITE_OTHER): Payer: Self-pay | Admitting: Adult Health

## 2021-12-24 ENCOUNTER — Other Ambulatory Visit: Payer: Self-pay

## 2021-12-24 ENCOUNTER — Ambulatory Visit (INDEPENDENT_AMBULATORY_CARE_PROVIDER_SITE_OTHER): Payer: 59 | Admitting: Adult Health

## 2021-12-24 VITALS — BP 105/63 | HR 70 | Temp 98.1°F | Ht 63.0 in | Wt 197.0 lb

## 2021-12-24 DIAGNOSIS — Z9189 Other specified personal risk factors, not elsewhere classified: Secondary | ICD-10-CM | POA: Diagnosis not present

## 2021-12-24 DIAGNOSIS — Z6835 Body mass index (BMI) 35.0-35.9, adult: Secondary | ICD-10-CM

## 2021-12-24 DIAGNOSIS — E559 Vitamin D deficiency, unspecified: Secondary | ICD-10-CM | POA: Diagnosis not present

## 2021-12-24 DIAGNOSIS — J3089 Other allergic rhinitis: Secondary | ICD-10-CM

## 2021-12-24 DIAGNOSIS — E8881 Metabolic syndrome: Secondary | ICD-10-CM

## 2021-12-24 DIAGNOSIS — E669 Obesity, unspecified: Secondary | ICD-10-CM

## 2021-12-24 DIAGNOSIS — E88819 Insulin resistance, unspecified: Secondary | ICD-10-CM

## 2021-12-24 DIAGNOSIS — Z6841 Body Mass Index (BMI) 40.0 and over, adult: Secondary | ICD-10-CM

## 2021-12-24 MED ORDER — VITAMIN D (ERGOCALCIFEROL) 1.25 MG (50000 UNIT) PO CAPS: ORAL_CAPSULE | ORAL | 0 refills | Status: DC

## 2021-12-24 MED ORDER — LEVOCETIRIZINE DIHYDROCHLORIDE 5 MG PO TABS: 5.0000 mg | ORAL_TABLET | Freq: Every evening | ORAL | 0 refills | Status: DC

## 2021-12-24 NOTE — Progress Notes (Signed)
Chief Complaint:   OBESITY Lauren Garza is here to discuss her progress with her obesity treatment plan along with follow-up of her obesity related diagnoses. Lauren Garza is on the Category 2 Plan and keeping a food journal and adhering to recommended goals of 400-500 calories and 35 grams of protein at supper and states she is following her eating plan approximately 70% of the time. Lauren Garza states she is not exercising regularly at this time.  Today's visit was #: 88 Starting weight: 237 lbs Starting date: 03/19/2020 Today's weight: 197 lbs Today's date: 12/24/2021 Total lbs lost to date: 40 lbs Total lbs lost since last in-office visit: 0  Interim History:  Due to uncontrolled Insulin Resistance and metformin intolerance- On 04/01/21- Metformin 500mg  QD d/c'd and  started on Ozempic. She eventually, slowly titrated up to 0.5mg  Ozempic.  Last refill Ozempic deferred by her insurance (new plan this year). She missed 1 dose - injects on Tuesday - last dose 12/09/2021. She reports increased polyphagia.  She recently had 2 dental crowns placed - timid to eat food.  Subjective:   1. Environmental and seasonal allergies Chronic viral/bacterial sinusitis. Recently purchased dehumidifier - in her bedroom. She denies acute respiratory symptoms at present. She denies tobacco/vape use.  2. Insulin resistance Due to uncontrolled Insulin Resistance and metformin intolerance (yeast infections)- On 04/01/21- Metformin 500mg  QD d/c'd and  started on Ozempic. She eventually, slowly titrated up to 0.5mg  Ozempic.  Last refill Ozempic deferred by her insurance (new plan this year). She missed 1 dose - injects on Tuesday - last dose 12/09/2021. She reports increased polyphagia.  3. Vitamin D deficiency On 07/31/2021, vitamin D level - 42.2 - below goal of 50. She is currently taking prescription ergocalciferol 50,000 IU each week. She denies nausea, vomiting or muscle weakness.  4. At risk for  hyperglycemia Lauren Garza is at risk for hyperglycemia due to insurance.  Assessment/Plan:   1. Environmental and seasonal allergies Refill levocetirizine 5 mg daily.  - Refill levocetirizine (XYZAL) 5 MG tablet; Take 1 tablet (5 mg total) by mouth every evening.  Dispense: 90 tablet; Refill: 0  2. Insulin resistance Increase protein, increase regular exercise. Check labs at next office visit.  3. Vitamin D deficiency Refill ergocalciferol 50,000 IU once weekly. Check labs at next office visit.  - Refill Vitamin D, Ergocalciferol, (DRISDOL) 1.25 MG (50000 UNIT) CAPS capsule; 1 capsules once per week.  Dispense: 4 capsule; Refill: 0  4. At risk for hyperglycemia Lauren Garza was given approximately 15 minutes of counseling today regarding prevention of hyperglycemia. She was advised of hyperglycemia causes and the fact hyperglycemia is often asymptomatic. Lauren Garza was instructed to avoid skipping meals, eat regular protein rich meals and schedule low calorie but protein rich snacks as needed.   Repetitive spaced learning was employed today to elicit superior memory formation and behavioral change   5. Obesity with current BMI of 35.0  Lauren Garza is currently in the action stage of change. As such, her goal is to continue with weight loss efforts. She has agreed to the Category 2 Plan and keeping a food journal and adhering to recommended goals of 400-500 calories and 35 grams of protein at supper.   Check fasting labs at next office visit.  Exercise goals:  Bowling. YouTube exercise once a week.  Stationary Bike. Try to engage in 1-2 these activities per week.  Behavioral modification strategies: increasing lean protein intake, decreasing simple carbohydrates, meal planning and cooking strategies, keeping healthy foods in the  home, and planning for success.  Lauren Garza has agreed to follow-up with our clinic in 3 weeks fasting. She was informed of the importance of frequent follow-up  visits to maximize her success with intensive lifestyle modifications for her multiple health conditions.   Objective:   Blood pressure 105/63, pulse 70, temperature 98.1 F (36.7 C), height 5\' 3"  (1.6 m), weight 197 lb (89.4 kg), last menstrual period 02/11/2018, SpO2 98 %. Body mass index is 34.9 kg/m.  General: Cooperative, alert, well developed, in no acute distress. HEENT: Conjunctivae and lids unremarkable. Cardiovascular: Regular rhythm.  Lungs: Normal work of breathing. Neurologic: No focal deficits.   Lab Results  Component Value Date   CREATININE 0.86 07/31/2021   BUN 12 07/31/2021   NA 140 07/31/2021   K 4.8 07/31/2021   CL 102 07/31/2021   CO2 23 07/31/2021   Lab Results  Component Value Date   ALT 12 07/31/2021   AST 15 07/31/2021   ALKPHOS 93 07/31/2021   BILITOT 0.4 07/31/2021   Lab Results  Component Value Date   HGBA1C 5.5 07/31/2021   HGBA1C 5.3 01/27/2021   HGBA1C 5.4 09/26/2020   HGBA1C 5.4 03/19/2020   Lab Results  Component Value Date   INSULIN 7.0 07/31/2021   INSULIN 8.1 01/27/2021   INSULIN 8.2 09/26/2020   INSULIN 8.7 06/19/2020   INSULIN 9.9 03/19/2020   Lab Results  Component Value Date   TSH 1.360 03/19/2020   Lab Results  Component Value Date   CHOL 179 07/31/2021   HDL 53 07/31/2021   LDLCALC 116 (H) 07/31/2021   TRIG 53 07/31/2021   CHOLHDL 3.4 07/31/2021   Lab Results  Component Value Date   VD25OH 42.2 07/31/2021   VD25OH 67.0 09/26/2020   VD25OH 55.0 08/14/2020   Lab Results  Component Value Date   WBC 9.1 11/07/2020   HGB 13.2 11/07/2020   HCT 41.9 11/07/2020   MCV 84.5 11/07/2020   PLT 311 11/07/2020   Attestation Statements:   Reviewed by clinician on day of visit: allergies, medications, problem list, medical history, surgical history, family history, social history, and previous encounter notes.  I, Water quality scientist, CMA, am acting as Location manager for Mina Marble, NP.  I have reviewed the above  documentation for accuracy and completeness, and I agree with the above. -  Malyia Moro d. Shiri Hodapp, NP-C

## 2022-01-03 MED ORDER — BUPIVACAINE HCL 0.25 % IJ SOLN
5.0000 mL | INTRAMUSCULAR | Status: AC | PRN
  Administered 2021-12-09: 5 mL via INTRA_ARTICULAR

## 2022-01-03 MED ORDER — TRIAMCINOLONE ACETONIDE 40 MG/ML IJ SUSP
40.0000 mg | INTRAMUSCULAR | Status: AC | PRN
  Administered 2021-12-09: 40 mg via INTRA_ARTICULAR

## 2022-01-03 NOTE — Progress Notes (Signed)
SHAKINA CHOY - 41 y.o. female MRN [id number]  Date of birth: 1981-08-22  Office Visit Note: Visit Date: 12/09/2021 PCP: Lin Landsman, MD Referred by: Lin Landsman, MD  Subjective: Chief Complaint  Patient presents with   Right Hip - Pain   Left Hip - Pain   HPI:  FRANCESA EUGENIO is a 41 y.o. female who comes in today for planned repeat Bilateral anesthetic greater trochanteric injection with fluoroscopic guidance.  The patient has failed conservative care including home exercise, medications, time and activity modification. Prior injection gave more than 50% relief for several months. This injection will be diagnostic and hopefully therapeutic.  Please see requesting physician notes for further details and justification.  Referring: Dr. Sarina Ill   ROS Otherwise per HPI.  Assessment & Plan: Visit Diagnoses:    ICD-10-CM   1. Greater trochanteric bursitis, left  M70.62 XR C-ARM NO REPORT    Large Joint Inj: bilateral greater trochanter    2. Greater trochanteric bursitis, right  M70.61 XR C-ARM NO REPORT    Large Joint Inj: bilateral greater trochanter      Plan: No additional findings.   Meds & Orders: No orders of the defined types were placed in this encounter.   Orders Placed This Encounter  Procedures   Large Joint Inj: bilateral greater trochanter   XR C-ARM NO REPORT    Follow-up: No follow-ups on file.   Procedures: Large Joint Inj: bilateral greater trochanter on 12/09/2021 2:15 PM Indications: pain and diagnostic evaluation Details: 22 G 3.5 in needle, fluoroscopy-guided lateral approach  Arthrogram: No  Medications (Right): 40 mg triamcinolone acetonide 40 MG/ML; 5 mL bupivacaine 0.25 % Medications (Left): 40 mg triamcinolone acetonide 40 MG/ML; 5 mL bupivacaine 0.25 % Outcome: tolerated well, no immediate complications  There was excellent flow of contrast outlined the greater trochanteric bursa without vascular uptake. Procedure,  treatment alternatives, risks and benefits explained, specific risks discussed. Consent was given by the patient. Immediately prior to procedure a time out was called to verify the correct patient, procedure, equipment, support staff and site/side marked as required. Patient was prepped and draped in the usual sterile fashion.         Clinical History: MRI LUMBAR SPINE WITHOUT CONTRAST   TECHNIQUE: Multiplanar, multisequence MR imaging of the lumbar spine was performed. No intravenous contrast was administered.   COMPARISON:  None available.   FINDINGS: Segmentation: Standard. Lowest well-formed disc space labeled the L5-S1 level.   Alignment: Mild dextroscoliosis. Alignment otherwise normal preservation of the normal lumbar lordosis.   Vertebrae: Vertebral body height maintained without evidence for acute or chronic fracture. Bone marrow signal intensity within normal limits. No discrete or worrisome osseous lesions. No abnormal marrow edema.   Conus medullaris and cauda equina: Conus extends to the L1 level. Conus and cauda equina appear normal.   Paraspinal and other soft tissues: Paraspinous soft tissues within normal limits. Visualized visceral structures are normal.   Disc levels:   L1-2:  Unremarkable.   L2-3:  Unremarkable.   L3-4: Disc desiccation with minimal annular disc bulge. No stenosis or impingement.   L4-5: Normal interspace. Mild facet hypertrophy. No stenosis or impingement.   L5-S1: Normal interspace. Mild facet hypertrophy. No stenosis or impingement.   IMPRESSION: 1. Mild degenerative disc disease at L3-4 without stenosis or impingement. 2. Mild facet hypertrophy at L4-5 and L5-S1. 3. Underlying mild dextroscoliosis.     Electronically Signed   By: Jeannine Boga M.D.   On: 02/24/2020  22:54     Objective:  VS:  HT:     WT:    BMI:      BP:    HR: bpm   TEMP: ( )   RESP:  Physical Exam   Imaging: No results found.

## 2022-01-14 ENCOUNTER — Other Ambulatory Visit: Payer: Self-pay

## 2022-01-14 ENCOUNTER — Ambulatory Visit (INDEPENDENT_AMBULATORY_CARE_PROVIDER_SITE_OTHER): Payer: 59 | Admitting: Adult Health

## 2022-01-14 ENCOUNTER — Encounter (INDEPENDENT_AMBULATORY_CARE_PROVIDER_SITE_OTHER): Payer: Self-pay | Admitting: Adult Health

## 2022-01-14 VITALS — BP 107/70 | HR 89 | Temp 98.6°F | Ht 63.0 in | Wt 195.0 lb

## 2022-01-14 DIAGNOSIS — R7401 Elevation of levels of liver transaminase levels: Secondary | ICD-10-CM

## 2022-01-14 DIAGNOSIS — E559 Vitamin D deficiency, unspecified: Secondary | ICD-10-CM

## 2022-01-14 DIAGNOSIS — E8881 Metabolic syndrome: Secondary | ICD-10-CM

## 2022-01-14 DIAGNOSIS — Z9189 Other specified personal risk factors, not elsewhere classified: Secondary | ICD-10-CM

## 2022-01-14 DIAGNOSIS — Z6834 Body mass index (BMI) 34.0-34.9, adult: Secondary | ICD-10-CM

## 2022-01-14 DIAGNOSIS — E669 Obesity, unspecified: Secondary | ICD-10-CM | POA: Diagnosis not present

## 2022-01-14 DIAGNOSIS — Z6841 Body Mass Index (BMI) 40.0 and over, adult: Secondary | ICD-10-CM

## 2022-01-14 MED ORDER — SEMAGLUTIDE-WEIGHT MANAGEMENT 0.25 MG/0.5ML ~~LOC~~ SOAJ: 0.2500 mg | SUBCUTANEOUS | 0 refills | Status: DC

## 2022-01-14 NOTE — Progress Notes (Signed)
Chief Complaint:   OBESITY Lauren Garza is here to discuss her progress with her obesity treatment plan along with follow-up of her obesity related diagnoses. Lauren Garza is on the Category 2 Plan and keeping a food journal and adhering to recommended goals of 400-500 calories and 35 grams of protein at supper and states she is following her eating plan approximately 50% of the time. Lauren Garza states she is not exercising regularly.  Today's visit was #: 98 Starting weight: 237 lbs Starting date: 03/19/2020 Today's weight: 195 lbs Today's date: 01/14/2022 Total lbs lost to date: 42 lbs Total lbs lost since last in-office visit: 2 lbs  Interim History:  Lauren Garza's last dose of Ozempic 0.5 mg - 12/09/2021. Changed from [place] to [employer] on 11/29/2021- current plan will not cover GLP- therapy.  Subjective:   1. Insulin resistance Last dose of Ozempic 0.5 mg - 12/09/2021. Changed from [place] to [employer] on 11/29/2021- current plan will not cover GLP- therapy. Increased carbohydrates/cravings the last few weeks - however she managed cravings with PC/Bern mindset.  2. Vitamin D deficiency She is currently taking prescription ergocalciferol 50,000 IU each week. She denies nausea, vomiting or muscle weakness.  3. Transaminitis She denies RUQ pain.  4. At risk for impaired function of liver Lauren Garza is at risk of impaired liver function due to having elevated LFTs.  Assessment/Plan:   1. Insulin resistance Check labs.  - Hemoglobin A1c - Insulin, random - Semaglutide-Weight Management 0.25 MG/0.5ML SOAJ; Inject 0.25 mg into the skin once a week.  Dispense: 2 mL; Refill: 0  2. Vitamin D deficiency Check labs.  - VITAMIN D 25 Hydroxy (Vit-D Deficiency, Fractures)  3. Transaminitis Check labs.  - Comprehensive metabolic panel  4. At risk for impaired function of liver Lauren Garza was given approximately 15 minutes of counseling today regarding  prevention of impaired liver function. Lauren Garza was educated about her risk of developing NASH or even liver failure and advised that the only proven treatment for NAFLD was weight loss of at least 5-10% of body weight.    5. Obesity with current BMI of 34.6  Lauren Garza is currently in the action stage of change. As such, her goal is to continue with weight loss efforts. She has agreed to the Category 2 Plan and keeping a food journal and adhering to recommended goals of 400-500 calories and 35 grams of protein at supper.   EXERCISE - increase daily activity.  Start Wegovy 0.25 mg once weekly.  Exercise goals: All adults should avoid inactivity. Some physical activity is better than none, and adults who participate in any amount of physical activity gain some health benefits. YouTube.  Behavioral modification strategies: increasing lean protein intake, decreasing simple carbohydrates, meal planning and cooking strategies, keeping healthy foods in the home, and planning for success.  Lauren Garza has agreed to follow-up with our clinic in 2-3 weeks. She was informed of the importance of frequent follow-up visits to maximize her success with intensive lifestyle modifications for her multiple health conditions.   Lauren Garza was informed we would discuss her lab results at her next visit unless there is a critical issue that needs to be addressed sooner. Lauren Garza agreed to keep her next visit at the agreed upon time to discuss these results.  Objective:   Blood pressure 107/70, pulse 89, temperature 98.6 F (37 C), height 5\' 3"  (1.6 m), weight 195 lb (88.5 kg), last menstrual period 02/11/2018, SpO2 98 %. Body mass index is  34.54 kg/m.  General: Cooperative, alert, well developed, in no acute distress. HEENT: Conjunctivae and lids unremarkable. Cardiovascular: Regular rhythm.  Lungs: Normal work of breathing. Neurologic: No focal deficits.   Lab Results  Component Value Date   CREATININE  0.86 07/31/2021   BUN 12 07/31/2021   NA 140 07/31/2021   K 4.8 07/31/2021   CL 102 07/31/2021   CO2 23 07/31/2021   Lab Results  Component Value Date   ALT 12 07/31/2021   AST 15 07/31/2021   ALKPHOS 93 07/31/2021   BILITOT 0.4 07/31/2021   Lab Results  Component Value Date   HGBA1C 5.5 07/31/2021   HGBA1C 5.3 01/27/2021   HGBA1C 5.4 09/26/2020   HGBA1C 5.4 03/19/2020   Lab Results  Component Value Date   INSULIN 7.0 07/31/2021   INSULIN 8.1 01/27/2021   INSULIN 8.2 09/26/2020   INSULIN 8.7 06/19/2020   INSULIN 9.9 03/19/2020   Lab Results  Component Value Date   TSH 1.360 03/19/2020   Lab Results  Component Value Date   CHOL 179 07/31/2021   HDL 53 07/31/2021   LDLCALC 116 (H) 07/31/2021   TRIG 53 07/31/2021   CHOLHDL 3.4 07/31/2021   Lab Results  Component Value Date   VD25OH 42.2 07/31/2021   VD25OH 67.0 09/26/2020   VD25OH 55.0 08/14/2020   Lab Results  Component Value Date   WBC 9.1 11/07/2020   HGB 13.2 11/07/2020   HCT 41.9 11/07/2020   MCV 84.5 11/07/2020   PLT 311 11/07/2020   Attestation Statements:   Reviewed by clinician on day of visit: allergies, medications, problem list, medical history, surgical history, family history, social history, and previous encounter notes.  I, Water quality scientist, CMA, am acting as Location manager for Mina Marble, NP.  I have reviewed the above documentation for accuracy and completeness, and I agree with the above. - Johnelle Tafolla d. Zavior Thomason, NP-C

## 2022-01-15 LAB — COMPREHENSIVE METABOLIC PANEL
ALT: 16 IU/L (ref 0–32)
AST: 17 IU/L (ref 0–40)
Albumin/Globulin Ratio: 1.6 (ref 1.2–2.2)
Albumin: 4.2 g/dL (ref 3.8–4.8)
Alkaline Phosphatase: 93 IU/L (ref 44–121)
BUN/Creatinine Ratio: 18 (ref 9–23)
BUN: 13 mg/dL (ref 6–20)
Bilirubin Total: 0.4 mg/dL (ref 0.0–1.2)
CO2: 24 mmol/L (ref 20–29)
Calcium: 9.2 mg/dL (ref 8.7–10.2)
Chloride: 102 mmol/L (ref 96–106)
Creatinine, Ser: 0.73 mg/dL (ref 0.57–1.00)
Globulin, Total: 2.7 g/dL (ref 1.5–4.5)
Glucose: 77 mg/dL (ref 70–99)
Potassium: 4.6 mmol/L (ref 3.5–5.2)
Sodium: 139 mmol/L (ref 134–144)
Total Protein: 6.9 g/dL (ref 6.0–8.5)
eGFR: 108 mL/min/{1.73_m2} (ref >59)

## 2022-01-15 LAB — INSULIN, RANDOM: INSULIN: 4.9 u[IU]/mL (ref 2.6–24.9)

## 2022-01-15 LAB — HEMOGLOBIN A1C
Est. average glucose Bld gHb Est-mCnc: 108 mg/dL
Hgb A1c MFr Bld: 5.4 % (ref 4.8–5.6)

## 2022-01-15 LAB — VITAMIN D 25 HYDROXY (VIT D DEFICIENCY, FRACTURES): Vit D, 25-Hydroxy: 69.7 ng/mL (ref 30.0–100.0)

## 2022-01-16 ENCOUNTER — Other Ambulatory Visit (INDEPENDENT_AMBULATORY_CARE_PROVIDER_SITE_OTHER): Payer: Self-pay | Admitting: Adult Health

## 2022-01-16 DIAGNOSIS — E559 Vitamin D deficiency, unspecified: Secondary | ICD-10-CM

## 2022-01-19 ENCOUNTER — Encounter (INDEPENDENT_AMBULATORY_CARE_PROVIDER_SITE_OTHER): Payer: Self-pay

## 2022-02-04 ENCOUNTER — Ambulatory Visit (INDEPENDENT_AMBULATORY_CARE_PROVIDER_SITE_OTHER): Payer: 59 | Admitting: Adult Health

## 2022-02-04 ENCOUNTER — Other Ambulatory Visit: Payer: Self-pay

## 2022-02-04 ENCOUNTER — Encounter (INDEPENDENT_AMBULATORY_CARE_PROVIDER_SITE_OTHER): Payer: Self-pay | Admitting: Adult Health

## 2022-02-04 VITALS — BP 119/65 | HR 69 | Temp 98.4°F | Ht 63.0 in | Wt 195.0 lb

## 2022-02-04 DIAGNOSIS — E559 Vitamin D deficiency, unspecified: Secondary | ICD-10-CM

## 2022-02-04 DIAGNOSIS — Z6834 Body mass index (BMI) 34.0-34.9, adult: Secondary | ICD-10-CM

## 2022-02-04 DIAGNOSIS — R7401 Elevation of levels of liver transaminase levels: Secondary | ICD-10-CM

## 2022-02-04 DIAGNOSIS — E8881 Metabolic syndrome: Secondary | ICD-10-CM | POA: Diagnosis not present

## 2022-02-04 DIAGNOSIS — E669 Obesity, unspecified: Secondary | ICD-10-CM | POA: Diagnosis not present

## 2022-02-04 NOTE — Progress Notes (Signed)
? ? ? ?Chief Complaint:  ? ?OBESITY ?Lauren Garza is here to discuss her progress with her obesity treatment plan along with follow-up of her obesity related diagnoses. Tammey is on the Category 2 Plan and keeping a food journal and adhering to recommended goals of 400-500 calories and 35 grams of protein at supper and states she is following her eating plan approximately 50% of the time. Lauren Garza states she is biking for 10 minutes 1 time per week. ? ?Today's visit was #: 41 ?Starting weight: 237 lbs ?Starting date: 03/19/2020 ?Today's weight: 195 lbs ?Today's date: 02/04/2022 ?Total lbs lost to date: 42 lbs ?Total lbs lost since last in-office visit: 0 ? ?Interim History:  ?Lauren Garza has started riding the stationary bike - at least once a week. ?Her children's grandfather passed away 2 weeks ago - age 23 - complications of dementia - ex-husband's father. ? ?Subjective:  ? ?1. Insulin resistance ?Previously n Ozempic.  Titrated up - 1 weekly injection of 0.5 mg. ?Last ose 12/09/2021.  New insurance would not cover any GLP-1 therapy - [id number]. ?On 01/14/2022, CMP- stable ?On 01/14/22  BG:77, A1c: 5.4, Insulin Level: 4.9- stable. ? ?2. Vitamin D deficiency ?On 01/14/2022, vitamin D level - 69.7. ?She is currently taking prescription ergocalciferol 50,000 IU each week. She denies nausea, vomiting or muscle weakness. ? ?3. Transaminitis ?On 01/14/2022, CMP - LFTs normal. ?She denies RUQ pain. ? ?Assessment/Plan:  ? ?1. Insulin resistance ?Monitor labs. ? ?2. Vitamin D deficiency ?Convert to OTC vitamin D3 5000 IU every other day. ? ?3. Transaminitis ?Monitor labs. ? ?4. Obesity with current BMI of 34.9 ? ?Chyna is currently in the action stage of change. As such, her goal is to continue with weight loss efforts. She has agreed to the Category 2 Plan and keeping a food journal and adhering to recommended goals of 400-500 calories and 35 grams of protein at supper.  ? ?Exercise goals:  Increase stationary  bike. ? ?Behavioral modification strategies: increasing lean protein intake, decreasing simple carbohydrates, meal planning and cooking strategies, keeping healthy foods in the home, and planning for success. ? ?Lauren Garza has agreed to follow-up with our clinic in 3 weeks.  Check IC in 6 weeks. She was informed of the importance of frequent follow-up visits to maximize her success with intensive lifestyle modifications for her multiple health conditions.  ? ?Objective:  ? ?Blood pressure 119/65, pulse 69, temperature 98.4 ?F (36.9 ?C), height '5\' 3"'$  (1.6 m), weight 195 lb (88.5 kg), last menstrual period 02/11/2018, SpO2 100 %. ?Body mass index is 34.54 kg/m?. ? ?General: Cooperative, alert, well developed, in no acute distress. ?HEENT: Conjunctivae and lids unremarkable. ?Cardiovascular: Regular rhythm.  ?Lungs: Normal work of breathing. ?Neurologic: No focal deficits.  ? ?Lab Results  ?Component Value Date  ? CREATININE 0.73 01/14/2022  ? BUN 13 01/14/2022  ? NA 139 01/14/2022  ? K 4.6 01/14/2022  ? CL 102 01/14/2022  ? CO2 24 01/14/2022  ? ?Lab Results  ?Component Value Date  ? ALT 16 01/14/2022  ? AST 17 01/14/2022  ? ALKPHOS 93 01/14/2022  ? BILITOT 0.4 01/14/2022  ? ?Lab Results  ?Component Value Date  ? HGBA1C 5.4 01/14/2022  ? HGBA1C 5.5 07/31/2021  ? HGBA1C 5.3 01/27/2021  ? HGBA1C 5.4 09/26/2020  ? HGBA1C 5.4 03/19/2020  ? ?Lab Results  ?Component Value Date  ? INSULIN 4.9 01/14/2022  ? INSULIN 7.0 07/31/2021  ? INSULIN 8.1 01/27/2021  ? INSULIN 8.2 09/26/2020  ? INSULIN 8.7 06/19/2020  ? ?  Lab Results  ?Component Value Date  ? TSH 1.360 03/19/2020  ? ?Lab Results  ?Component Value Date  ? CHOL 179 07/31/2021  ? HDL 53 07/31/2021  ? LDLCALC 116 (H) 07/31/2021  ? TRIG 53 07/31/2021  ? CHOLHDL 3.4 07/31/2021  ? ?Lab Results  ?Component Value Date  ? VD25OH 69.7 01/14/2022  ? VD25OH 42.2 07/31/2021  ? VD25OH 67.0 09/26/2020  ? ?Lab Results  ?Component Value Date  ? WBC 9.1 11/07/2020  ? HGB 13.2 11/07/2020  ?  HCT 41.9 11/07/2020  ? MCV 84.5 11/07/2020  ? PLT 311 11/07/2020  ? ?Attestation Statements:  ? ?Reviewed by clinician on day of visit: allergies, medications, problem list, medical history, surgical history, family history, social history, and previous encounter notes. ? ?I, Water quality scientist, CMA, am acting as Location manager for Mina Marble, NP. ? ?I have reviewed the above documentation for accuracy and completeness, and I agree with the above. -  Brack Shaddock d. Timica Marcom, NP-C ?

## 2022-02-17 ENCOUNTER — Other Ambulatory Visit: Payer: Self-pay

## 2022-02-17 ENCOUNTER — Ambulatory Visit (INDEPENDENT_AMBULATORY_CARE_PROVIDER_SITE_OTHER): Payer: 59 | Admitting: Adult Health

## 2022-02-17 ENCOUNTER — Encounter (INDEPENDENT_AMBULATORY_CARE_PROVIDER_SITE_OTHER): Payer: Self-pay | Admitting: Adult Health

## 2022-02-17 VITALS — BP 116/73 | HR 72 | Temp 99.4°F | Ht 63.0 in | Wt 199.0 lb

## 2022-02-17 DIAGNOSIS — J329 Chronic sinusitis, unspecified: Secondary | ICD-10-CM | POA: Diagnosis not present

## 2022-02-17 DIAGNOSIS — Z6835 Body mass index (BMI) 35.0-35.9, adult: Secondary | ICD-10-CM

## 2022-02-17 DIAGNOSIS — E559 Vitamin D deficiency, unspecified: Secondary | ICD-10-CM | POA: Diagnosis not present

## 2022-02-17 DIAGNOSIS — E669 Obesity, unspecified: Secondary | ICD-10-CM | POA: Diagnosis not present

## 2022-02-17 NOTE — Progress Notes (Signed)
? ? ? ?Chief Complaint:  ? ?OBESITY ?Lauren Garza is here to discuss her progress with her obesity treatment plan along with follow-up of her obesity related diagnoses. Lauren Garza is on the Category 2 Plan and keeping a food journal and adhering to recommended goals of 400-500 calories and 35 grams of protein at supper and states she is following her eating plan approximately 70% of the time. Lauren Garza states she is not exercising regularly at this time. ? ?Today's visit was #: 42 ?Starting weight: 237 lbs ?Starting date: 03/19/2020 ?Today's weight: 199 lbs ?Today's date: 02/17/2022 ?Total lbs lost to date: 38 lbs ?Total lbs lost since last in-office visit: 0 ? ?Interim History:  ?Lauren Garza endorses increased polyphagia since being off GLP-I therapy. ?"Off plan food" -Peanut butter waffle, cornbread, soda (ginger ale, Dr. Malachi Bonds). ? ?Subjective:  ? ?1. Vitamin D deficiency ?Last level on 01/14/2022 - 69.7. ?Ergocalciferol converted to OTC vitamin D3 5000 IU daily. ? ?2. Chronic sinusitis, unspecified location ?Hx of 2 previous sinus surgeries. ?She continues to experience perpetual nasal congestion and pharyngitis. ? ?07/07/21 ENT Note- ?Assessment: ?Residual sinus disease following previous surgery x2.  It appears that the right nasal frontal area can be improved with surgery.   ?  ?Plan: ?She has taken numerous antibiotics as well as steroids in the past as well as using saline rinses and steroid nasal sprays which I would recommend continuing with. ?Discussed with her that she might benefit from further surgical intervention but would recommend evaluation by sinus surgeon at one of the tertiary care hospital ? ?Assessment/Plan:  ? ?1. Vitamin D deficiency ?Continue OTC vitamin D3 5000 IU daily. ? ?2. Chronic sinusitis, unspecified location ?Continue current Rx regime and add in Vicks Vapor Rub products - shower tablets. ?Follow-up with PCP. ? ?3. Obesity with current BMI of 35.3 ? ?Lauren Garza is currently in the action  stage of change. As such, her goal is to continue with weight loss efforts. She has agreed to the Category 2 Plan and keeping a food journal and adhering to recommended goals of 400-500 calories and 35 grams of protein at supper.  ? ?Consistent exercise - bowling, stationary bike. ? ?Exercise goals: All adults should avoid inactivity. Some physical activity is better than none, and adults who participate in any amount of physical activity gain some health benefits. ? ?Behavioral modification strategies: increasing lean protein intake, decreasing simple carbohydrates, meal planning and cooking strategies, keeping healthy foods in the home, and planning for success. ? ?Lauren Garza has agreed to follow-up with our clinic in 3 weeks. She was informed of the importance of frequent follow-up visits to maximize her success with intensive lifestyle modifications for her multiple health conditions.  ? ?Objective:  ? ?Blood pressure 116/73, pulse 72, temperature 99.4 ?F (37.4 ?C), height '5\' 3"'$  (1.6 m), weight 199 lb (90.3 kg), last menstrual period 02/11/2018, SpO2 97 %. ?Body mass index is 35.25 kg/m?. ? ?General: Cooperative, alert, well developed, in no acute distress. ?HEENT: Conjunctivae and lids unremarkable. ?Cardiovascular: Regular rhythm.  ?Lungs: Normal work of breathing. ?Neurologic: No focal deficits.  ? ?Lab Results  ?Component Value Date  ? CREATININE 0.73 01/14/2022  ? BUN 13 01/14/2022  ? NA 139 01/14/2022  ? K 4.6 01/14/2022  ? CL 102 01/14/2022  ? CO2 24 01/14/2022  ? ?Lab Results  ?Component Value Date  ? ALT 16 01/14/2022  ? AST 17 01/14/2022  ? ALKPHOS 93 01/14/2022  ? BILITOT 0.4 01/14/2022  ? ?Lab Results  ?Component Value Date  ?  HGBA1C 5.4 01/14/2022  ? HGBA1C 5.5 07/31/2021  ? HGBA1C 5.3 01/27/2021  ? HGBA1C 5.4 09/26/2020  ? HGBA1C 5.4 03/19/2020  ? ?Lab Results  ?Component Value Date  ? INSULIN 4.9 01/14/2022  ? INSULIN 7.0 07/31/2021  ? INSULIN 8.1 01/27/2021  ? INSULIN 8.2 09/26/2020  ? INSULIN 8.7  06/19/2020  ? ?Lab Results  ?Component Value Date  ? TSH 1.360 03/19/2020  ? ?Lab Results  ?Component Value Date  ? CHOL 179 07/31/2021  ? HDL 53 07/31/2021  ? LDLCALC 116 (H) 07/31/2021  ? TRIG 53 07/31/2021  ? CHOLHDL 3.4 07/31/2021  ? ?Lab Results  ?Component Value Date  ? VD25OH 69.7 01/14/2022  ? VD25OH 42.2 07/31/2021  ? VD25OH 67.0 09/26/2020  ? ?Lab Results  ?Component Value Date  ? WBC 9.1 11/07/2020  ? HGB 13.2 11/07/2020  ? HCT 41.9 11/07/2020  ? MCV 84.5 11/07/2020  ? PLT 311 11/07/2020  ? ?Attestation Statements:  ? ?Reviewed by clinician on day of visit: allergies, medications, problem list, medical history, surgical history, family history, social history, and previous encounter notes. ? ?I, Water quality scientist, CMA, am acting as Location manager for Mina Marble, NP. ? ?I have reviewed the above documentation for accuracy and completeness, and I agree with the above. -  Lauren Garza d. Notnamed Croucher, NP-C ?

## 2022-03-18 ENCOUNTER — Encounter (INDEPENDENT_AMBULATORY_CARE_PROVIDER_SITE_OTHER): Payer: Self-pay | Admitting: Adult Health

## 2022-03-18 ENCOUNTER — Ambulatory Visit (INDEPENDENT_AMBULATORY_CARE_PROVIDER_SITE_OTHER): Payer: 59 | Admitting: Adult Health

## 2022-03-18 ENCOUNTER — Telehealth: Payer: Self-pay

## 2022-03-18 VITALS — BP 136/84 | HR 69 | Temp 98.3°F | Ht 63.6 in | Wt 200.0 lb

## 2022-03-18 DIAGNOSIS — Z6835 Body mass index (BMI) 35.0-35.9, adult: Secondary | ICD-10-CM

## 2022-03-18 DIAGNOSIS — E559 Vitamin D deficiency, unspecified: Secondary | ICD-10-CM | POA: Diagnosis not present

## 2022-03-18 DIAGNOSIS — E8881 Metabolic syndrome: Secondary | ICD-10-CM | POA: Diagnosis not present

## 2022-03-18 DIAGNOSIS — Z9189 Other specified personal risk factors, not elsewhere classified: Secondary | ICD-10-CM

## 2022-03-18 DIAGNOSIS — R0602 Shortness of breath: Secondary | ICD-10-CM | POA: Diagnosis not present

## 2022-03-18 DIAGNOSIS — R7401 Elevation of levels of liver transaminase levels: Secondary | ICD-10-CM

## 2022-03-18 DIAGNOSIS — E669 Obesity, unspecified: Secondary | ICD-10-CM

## 2022-03-18 NOTE — Telephone Encounter (Signed)
Patient called and would like a repeat injection and would like a call to set that appt up  ?

## 2022-03-19 ENCOUNTER — Other Ambulatory Visit (INDEPENDENT_AMBULATORY_CARE_PROVIDER_SITE_OTHER): Payer: Self-pay | Admitting: Adult Health

## 2022-03-19 DIAGNOSIS — J3089 Other allergic rhinitis: Secondary | ICD-10-CM

## 2022-03-19 LAB — HEMOGLOBIN A1C
Est. average glucose Bld gHb Est-mCnc: 108 mg/dL
Hgb A1c MFr Bld: 5.4 % (ref 4.8–5.6)

## 2022-03-19 LAB — COMPREHENSIVE METABOLIC PANEL
ALT: 23 IU/L (ref 0–32)
AST: 22 IU/L (ref 0–40)
Albumin/Globulin Ratio: 1.6 (ref 1.2–2.2)
Albumin: 4.2 g/dL (ref 3.8–4.8)
Alkaline Phosphatase: 101 IU/L (ref 44–121)
BUN/Creatinine Ratio: 17 (ref 9–23)
BUN: 15 mg/dL (ref 6–20)
Bilirubin Total: 0.6 mg/dL (ref 0.0–1.2)
CO2: 24 mmol/L (ref 20–29)
Calcium: 9.3 mg/dL (ref 8.7–10.2)
Chloride: 100 mmol/L (ref 96–106)
Creatinine, Ser: 0.89 mg/dL (ref 0.57–1.00)
Globulin, Total: 2.7 g/dL (ref 1.5–4.5)
Glucose: 70 mg/dL (ref 70–99)
Potassium: 4.2 mmol/L (ref 3.5–5.2)
Sodium: 138 mmol/L (ref 134–144)
Total Protein: 6.9 g/dL (ref 6.0–8.5)
eGFR: 85 mL/min/{1.73_m2} (ref >59)

## 2022-03-19 LAB — VITAMIN D 25 HYDROXY (VIT D DEFICIENCY, FRACTURES): Vit D, 25-Hydroxy: 48.7 ng/mL (ref 30.0–100.0)

## 2022-03-19 LAB — INSULIN, RANDOM: INSULIN: 10.3 u[IU]/mL (ref 2.6–24.9)

## 2022-03-19 MED ORDER — LEVOCETIRIZINE DIHYDROCHLORIDE 5 MG PO TABS: 5.0000 mg | ORAL_TABLET | Freq: Every evening | ORAL | 0 refills | Status: DC

## 2022-03-19 NOTE — Telephone Encounter (Signed)
LAST APPOINTMENT DATE: 03/18/22 ?NEXT APPOINTMENT DATE: 04/07/22 ? ? ?[id number] [address], [address] ?[place] ?[place] [address] ?Phone: [phone] Fax: [phone] ? ?[facility] ?[address] ?[place] [address] ?Phone: [phone] Fax: [phone] ? ?Patient is requesting a refill of the following medications: ?Pending Prescriptions:                       Disp   Refills ?  levocetirizine (XYZAL) 5 MG tablet         90 tab*0       ?Sig: Take 1 tablet (5 mg total) by mouth every evening. ? ? ?Date last filled: 12/24/21 ?Previously prescribed by Valetta Fuller ? ?Lab Results ?     Component                Value               Date                 ?     HGBA1C                   5.4                 03/18/2022           ?     HGBA1C                   5.4                 01/14/2022           ?     HGBA1C                   5.5                 07/31/2021           ?Lab Results ?     Component                Value               Date                 ?     LDLCALC                  116 (H)             07/31/2021           ?     CREATININE               0.89                03/18/2022           ?Lab Results ?     Component                Value               Date                 ?     VD25OH                   48.7                03/18/2022           ?  VD25OH                   69.7                01/14/2022           ?     VD25OH                   42.2                07/31/2021           ? ?BP Readings from Last 3 Encounters: ?03/18/22 : 136/84 ?02/17/22 : 116/73 ?02/04/22 : 119/65 ?

## 2022-03-31 NOTE — Progress Notes (Signed)
? ? ? ?Chief Complaint:  ? ?OBESITY ?Lauren Garza is here to discuss her progress with her obesity treatment plan along with follow-up of her obesity related diagnoses. Lauren Garza is on the Category 2 Plan and keeping a food journal and adhering to recommended goals of 400-500 calories and 35 grams of  protein and states she is following her eating plan approximately 65-70% of the time. Lauren Garza states she is riding a bike 11-17 minutes 3 times per week. ? ?Today's visit was #: 11 ?Starting weight: 237 lbs ?Starting date: 03/19/2020 ?Today's weight: 200 lbs ?Today's date: 03/18/2022 ?Total lbs lost to date: 34 ?Total lbs lost since last in-office visit: 0 ? ?Interim History:  ?Lauren Garza's prior RMRs: ?1) 03/29/20 1336 ?2) 09/26/20 1165  ?3) today-1901.  ?Metabolism increased 236 calories. ? ?Of Note: recently went to [facility] [facility] for protein shake: [employer]. ? ?Subjective:  ? ?1. Insulin resistance ?Previously on Ozempic.   ?Titrated up - 1 weekly injection of 0.5 mg. ?Last dose 12/09/2021.   ?New insurance would not cover any GLP-1 therapy - [id number]. ? ?2. SOB (shortness of breath) on exertion ?Lauren Garza reports dyspnea with extreme exertion. Denies chest pressure with exertion.  ? ?3. Transaminitis ?Lauren Garza denies right upper quad pain. ? ?4. Vitamin D deficiency ?Lauren Garza is currently taking over the counter Vit D3 50,000 IU every other day. ? ?5. At risk for impaired function of liver ?Lauren Garza is at increased risk for impaired liver function due to transaminitis obesity. ? ? ?Assessment/Plan:  ? ?1. Insulin resistance ? ?- Hemoglobin A1c ?- Insulin, random ? ?2. SOB (shortness of breath) on exertion ?Lauren Garza will have IC checked  today. ? ?3. Transaminitis ?Lauren Garza will have labs checked today. ?- Comprehensive metabolic panel ? ?4. Vitamin D deficiency ?Lauren Garza will have labs checked today. ?- VITAMIN D 25 Hydroxy (Vit-D Deficiency, Fractures) ? ?5. At risk for impaired function of liver ?Lauren Garza  was given approximately 15 minutes of counseling today regarding prevention of impaired liver function. Jaycee was educated about her risk of developing NASH or even liver failure and advised that the only proven treatment for NAFLD was weight loss of at least 5-10% of body weight.  ? ? ?6. Obesity with current BMI of 35.5 ?Lauren Garza is currently in the action stage of change. As such, her goal is to continue with weight loss efforts. She has agreed to keeping a food journal and adhering to recommended goals of 400-500 calories and 35 grams of  protein.  ? ?Exercise goals: As is. Biking 2-3 times a week. ? ?Behavioral modification strategies: increasing lean protein intake, decreasing simple carbohydrates, meal planning and cooking strategies, keeping healthy foods in the home, and planning for success. ? ?Lauren Garza has agreed to follow-up with our clinic in 2 weeks. She was informed of the importance of frequent follow-up visits to maximize her success with intensive lifestyle modifications for her multiple health conditions.  ? ?Lauren Garza was informed we would discuss her lab results at her next visit unless there is a critical issue that needs to be addressed sooner. Lauren Garza agreed to keep her next visit at the agreed upon time to discuss these results. ? ?Objective:  ? ?Blood pressure 136/84, pulse 69, temperature 98.3 ?F (36.8 ?C), height 5' 3.6" (1.615 m), weight 200 lb (90.7 kg), last menstrual period 02/11/2018, SpO2 99 %. ?Body mass index is 34.76 kg/m?. ? ?General: Cooperative, alert, well developed, in no acute distress. ?HEENT: Conjunctivae and lids unremarkable. ?Cardiovascular: Regular rhythm.  ?Lungs: Normal work  of breathing. ?Neurologic: No focal deficits.  ? ?Lab Results  ?Component Value Date  ? CREATININE 0.89 03/18/2022  ? BUN 15 03/18/2022  ? NA 138 03/18/2022  ? K 4.2 03/18/2022  ? CL 100 03/18/2022  ? CO2 24 03/18/2022  ? ?Lab Results  ?Component Value Date  ? ALT 23 03/18/2022  ? AST 22  03/18/2022  ? ALKPHOS 101 03/18/2022  ? BILITOT 0.6 03/18/2022  ? ?Lab Results  ?Component Value Date  ? HGBA1C 5.4 03/18/2022  ? HGBA1C 5.4 01/14/2022  ? HGBA1C 5.5 07/31/2021  ? HGBA1C 5.3 01/27/2021  ? HGBA1C 5.4 09/26/2020  ? ?Lab Results  ?Component Value Date  ? INSULIN 10.3 03/18/2022  ? INSULIN 4.9 01/14/2022  ? INSULIN 7.0 07/31/2021  ? INSULIN 8.1 01/27/2021  ? INSULIN 8.2 09/26/2020  ? ?Lab Results  ?Component Value Date  ? TSH 1.360 03/19/2020  ? ?Lab Results  ?Component Value Date  ? CHOL 179 07/31/2021  ? HDL 53 07/31/2021  ? LDLCALC 116 (H) 07/31/2021  ? TRIG 53 07/31/2021  ? CHOLHDL 3.4 07/31/2021  ? ?Lab Results  ?Component Value Date  ? VD25OH 48.7 03/18/2022  ? VD25OH 69.7 01/14/2022  ? VD25OH 42.2 07/31/2021  ? ?Lab Results  ?Component Value Date  ? WBC 9.1 11/07/2020  ? HGB 13.2 11/07/2020  ? HCT 41.9 11/07/2020  ? MCV 84.5 11/07/2020  ? PLT 311 11/07/2020  ? ?No results found for: IRON, TIBC, FERRITIN ? ?Attestation Statements:  ? ?Reviewed by clinician on day of visit: allergies, medications, problem list, medical history, surgical history, family history, social history, and previous encounter notes. ? ?I, Brendell Tyus, RMA, am acting as transcriptionist for Mina Marble, NP. ? ?I have reviewed the above documentation for accuracy and completeness, and I agree with the above. -  [place]cil [place] d. Skarlette Lattner, NP-C ?

## 2022-04-07 ENCOUNTER — Encounter (INDEPENDENT_AMBULATORY_CARE_PROVIDER_SITE_OTHER): Payer: Self-pay | Admitting: Adult Health

## 2022-04-07 ENCOUNTER — Ambulatory Visit (INDEPENDENT_AMBULATORY_CARE_PROVIDER_SITE_OTHER): Payer: 59 | Admitting: Adult Health

## 2022-04-07 VITALS — BP 111/73 | HR 77 | Temp 98.6°F | Ht 63.0 in | Wt 204.0 lb

## 2022-04-07 DIAGNOSIS — E669 Obesity, unspecified: Secondary | ICD-10-CM | POA: Diagnosis not present

## 2022-04-07 DIAGNOSIS — E8881 Metabolic syndrome: Secondary | ICD-10-CM

## 2022-04-07 DIAGNOSIS — E559 Vitamin D deficiency, unspecified: Secondary | ICD-10-CM | POA: Diagnosis not present

## 2022-04-07 DIAGNOSIS — R7401 Elevation of levels of liver transaminase levels: Secondary | ICD-10-CM

## 2022-04-07 DIAGNOSIS — Z6836 Body mass index (BMI) 36.0-36.9, adult: Secondary | ICD-10-CM

## 2022-04-07 MED ORDER — VITAMIN D (ERGOCALCIFEROL) 1.25 MG (50000 UNIT) PO CAPS
50000.0000 [IU] | ORAL_CAPSULE | ORAL | 0 refills | Status: DC
Start: 2022-04-07 — End: 2022-05-07

## 2022-04-14 NOTE — Progress Notes (Signed)
? ? ? ?Chief Complaint:  ? ?OBESITY ?Lauren Garza is here to discuss her progress with her obesity treatment plan along with follow-up of her obesity related diagnoses. Lauren Garza is on the Category 2 Plan and keeping a food journal and adhering to recommended goals of 400-500 calories and 35 gms protein at dinner and states she is following her eating plan approximately 70% of the time. Lauren Garza states she is doing cardio for 11-35 minutes 7 times per week. ? ?Today's visit was #: 38 ?Starting weight: 237 ?Starting date: 03/19/20 ?Today's weight: 204 lbs ?Today's date: 04/07/22 ?Total lbs lost to date: 74 ?Total lbs lost since last in-office visit: 0. Gained 4 lbs. ? ?Interim History:  ?Last dose of Ozempic 0.5 mg on or about 12/09/2021. ?Her insurance would not approve Wegovy to replace Ozempic.   ?She has started cardio exercise (stationary bike) 11 to 35 minutes 7 times per week-great! ?Muscle mass is up 3 pounds. ?Adipose mass is up to 0.4 pounds ? ?Subjective:  ? ?1. Insulin resistance ?Worsening.  Discussed lab with patient today. ?Unable to tolerate metformin (yeast infection). ?Last dose of Ozempic 0.5 mg on or about 12/09/2021. ?Her insurance would not approve Wegovy to replace Ozempic.   ?03/18/22 blood glucose 70, A1c 5.4, insulin level 10.3.  Insulin level increased from 4.9 on 01/14/2022. ?PCP provided her samples of Rybelsus 3 mg.   ?She has not started this due to concern of medication side effects. ? ?2. Transaminitis ?Discussed lab with patient today. ?03/18/2022 CMP LFTs within normal limits. ?She denies right upper quadrant pain. ? ?3. Vitamin D deficiency ?Discussed labs with patient today. ?She is on vitamin D3 5000 IU daily. ?03/18/2022 vitamin D level 48.7. ?She endorses increased fatigue the last month. ? ?Assessment/Plan:  ? ?1. Insulin resistance ?Continue category 3 meal plan and regular exercise. ? ?2. Transaminitis ?Avoid hepatotoxic substances. ?We will monitor labs. ? ?3. Vitamin D  deficiency ?Stop OTC supplementation.  Restart ergocalciferol 50,000 IU once weekly dispense 8 capsules 0 refills ? ?RF Ergocalciferol 50,000 IU once week Disp 8 caps RF 0 ? ?4. Obesity with current BMI of 36.1 ?Handouts: 100/200 snack lists. ? ?Lauren Garza is currently in the action stage of change. As such, her goal is to continue with weight loss efforts. She has agreed to the Category 3 Plan.  ? ?Exercise goals: As is. ? ?Behavioral modification strategies: increasing lean protein intake, decreasing simple carbohydrates, meal planning and cooking strategies, keeping healthy foods in the home, and planning for success. ? ?Lauren Garza has agreed to follow-up with our clinic in 2-3 weeks. She was informed of the importance of frequent follow-up visits to maximize her success with intensive lifestyle modifications for her multiple health conditions.  ? ? ?Objective:  ? ?Blood pressure 111/73, pulse 77, temperature 98.6 ?F (37 ?C), height '5\' 3"'$  (1.6 m), weight 204 lb (92.5 kg), last menstrual period 02/11/2018, SpO2 100 %. ?Body mass index is 36.14 kg/m?. ? ?General: Cooperative, alert, well developed, in no acute distress. ?HEENT: Conjunctivae and lids unremarkable. ?Cardiovascular: Regular rhythm.  ?Lungs: Normal work of breathing. ?Neurologic: No focal deficits.  ? ?Lab Results  ?Component Value Date  ? CREATININE 0.89 03/18/2022  ? BUN 15 03/18/2022  ? NA 138 03/18/2022  ? K 4.2 03/18/2022  ? CL 100 03/18/2022  ? CO2 24 03/18/2022  ? ?Lab Results  ?Component Value Date  ? ALT 23 03/18/2022  ? AST 22 03/18/2022  ? ALKPHOS 101 03/18/2022  ? BILITOT 0.6 03/18/2022  ? ?  Lab Results  ?Component Value Date  ? HGBA1C 5.4 03/18/2022  ? HGBA1C 5.4 01/14/2022  ? HGBA1C 5.5 07/31/2021  ? HGBA1C 5.3 01/27/2021  ? HGBA1C 5.4 09/26/2020  ? ?Lab Results  ?Component Value Date  ? INSULIN 10.3 03/18/2022  ? INSULIN 4.9 01/14/2022  ? INSULIN 7.0 07/31/2021  ? INSULIN 8.1 01/27/2021  ? INSULIN 8.2 09/26/2020  ? ?Lab Results  ?Component  Value Date  ? TSH 1.360 03/19/2020  ? ?Lab Results  ?Component Value Date  ? CHOL 179 07/31/2021  ? HDL 53 07/31/2021  ? LDLCALC 116 (H) 07/31/2021  ? TRIG 53 07/31/2021  ? CHOLHDL 3.4 07/31/2021  ? ?Lab Results  ?Component Value Date  ? VD25OH 48.7 03/18/2022  ? VD25OH 69.7 01/14/2022  ? VD25OH 42.2 07/31/2021  ? ?Lab Results  ?Component Value Date  ? WBC 9.1 11/07/2020  ? HGB 13.2 11/07/2020  ? HCT 41.9 11/07/2020  ? MCV 84.5 11/07/2020  ? PLT 311 11/07/2020  ? ?No results found for: IRON, TIBC, FERRITIN ? ?Attestation Statements:  ? ?Reviewed by clinician on day of visit: allergies, medications, problem list, medical history, surgical history, family history, social history, and previous encounter notes. ? ?I, Georgianne Fick, FNP, am acting as Location manager for Mina Marble, NP. ? ?I have reviewed the above documentation for accuracy and completeness, and I agree with the above. -  Nickoli Bagheri d. Latorria Zeoli, NP-C  ?

## 2022-04-21 ENCOUNTER — Encounter: Payer: Self-pay | Admitting: Physical Medicine and Rehabilitation

## 2022-04-21 ENCOUNTER — Encounter (INDEPENDENT_AMBULATORY_CARE_PROVIDER_SITE_OTHER): Payer: Self-pay | Admitting: Adult Health

## 2022-04-21 ENCOUNTER — Ambulatory Visit (INDEPENDENT_AMBULATORY_CARE_PROVIDER_SITE_OTHER): Payer: 59 | Admitting: Adult Health

## 2022-04-21 ENCOUNTER — Ambulatory Visit: Payer: Self-pay

## 2022-04-21 ENCOUNTER — Ambulatory Visit (INDEPENDENT_AMBULATORY_CARE_PROVIDER_SITE_OTHER): Payer: 59 | Admitting: Physical Medicine and Rehabilitation

## 2022-04-21 VITALS — BP 114/70 | HR 85 | Ht 63.0 in | Wt 207.0 lb

## 2022-04-21 DIAGNOSIS — M7061 Trochanteric bursitis, right hip: Secondary | ICD-10-CM

## 2022-04-21 DIAGNOSIS — Z6836 Body mass index (BMI) 36.0-36.9, adult: Secondary | ICD-10-CM

## 2022-04-21 DIAGNOSIS — M7062 Trochanteric bursitis, left hip: Secondary | ICD-10-CM

## 2022-04-21 DIAGNOSIS — J329 Chronic sinusitis, unspecified: Secondary | ICD-10-CM | POA: Diagnosis not present

## 2022-04-21 DIAGNOSIS — E669 Obesity, unspecified: Secondary | ICD-10-CM

## 2022-04-21 NOTE — Progress Notes (Signed)
   Lauren Garza - 41 y.o. female MRN [id number]  Date of birth: 1981/02/08  Office Visit Note: Visit Date: 04/21/2022 PCP: Lin Landsman, MD Referred by: Lin Landsman, MD  Subjective: Chief Complaint  Patient presents with   Right Hip - Pain   Left Hip - Pain   HPI:  Lauren Garza is a 41 y.o. female who comes in todayfor planned repeat Bilateral anesthetic greater trochanteric injection with fluoroscopic guidance.  The patient has failed conservative care including home exercise, medications, time and activity modification. Prior injection gave more than 50% relief for several months. This injection will be diagnostic and hopefully therapeutic.  Please see requesting physician notes for further details and justification.   Referring: Dr. Sarina Ill   ROS Otherwise per HPI.  Assessment & Plan: Visit Diagnoses:    ICD-10-CM   1. Greater trochanteric bursitis, left  M70.62 XR C-ARM NO REPORT    Large Joint Inj: bilateral greater trochanter    2. Greater trochanteric bursitis, right  M70.61 XR C-ARM NO REPORT    Large Joint Inj: bilateral greater trochanter      Plan: No additional findings.   Meds & Orders: No orders of the defined types were placed in this encounter.   Orders Placed This Encounter  Procedures   Large Joint Inj: bilateral greater trochanter   XR C-ARM NO REPORT    Follow-up: Return if symptoms worsen or fail to improve.   Procedures: Large Joint Inj: bilateral greater trochanter on 04/21/2022 1:55 PM Indications: pain and diagnostic evaluation Details: 22 G 3.5 in needle, lateral approach  Arthrogram: No  Medications (Right): 40 mg triamcinolone acetonide 40 MG/ML; 5 mL bupivacaine 0.5 % Medications (Left): 40 mg triamcinolone acetonide 40 MG/ML; 5 mL bupivacaine 0.5 % Outcome: tolerated well, no immediate complications  Greatest area of pain over the greater trochanter was palpated and marked prior to injection. The patient did seem to  have relief after the injection. Procedure, treatment alternatives, risks and benefits explained, specific risks discussed. Consent was given by the patient. Immediately prior to procedure a time out was called to verify the correct patient, procedure, equipment, support staff and site/side marked as required. Patient was prepped and draped in the usual sterile fashion.         Clinical History: No specialty comments available.     Objective:  VS:  HT:    WT:   BMI:     BP:   HR: bpm  TEMP: ( )  RESP:  Physical Exam   Imaging: No results found.

## 2022-04-21 NOTE — Progress Notes (Signed)
Pt state pain in both hips. Pt state standing for a long time makes the pain worse. Pt state she takes over the counter pain meds to help ease her pain.  Numeric Pain Rating Scale and Functional Assessment Average Pain 8   In the last MONTH (on 0-10 scale) has pain interfered with the following?  1. General activity like being  able to carry out your everyday physical activities such as walking, climbing stairs, carrying groceries, or moving a chair?  Rating(10)   -BT, -Dye Allergies.

## 2022-04-23 NOTE — Progress Notes (Signed)
Chief Complaint:   OBESITY Lauren Garza is here to discuss her progress with her obesity treatment plan along with follow-up of her obesity related diagnoses. Dashayla is on the Category 3 Plan and states she is following her eating plan approximately 70% of the time. Valissa states she is riding stationary bike for 20 minutes 7 times per week.  Today's visit was #: 45 Starting weight: 237 Starting date: 03/19/20 Today's weight: 207 lbs Today's date: 04/21/22 Total lbs lost to date: 30 lbs Total lbs lost since last in-office visit: +3  Interim History:  Ms. Vanburen started stationary bike riding.   03/18/2022 muscle mass 104.4 lbs.   04/21/2022 muscle mass 109.2 lbs.  +4.8 lbs of muscle mass since last OV!  Recently purchased size 16 jeans!    She made and enjoyed banana pudding brownie.  Nutrition information: 1 serving equals 128 cal and 3 g of protein.    Subjective:   1. Chronic sinusitis, unspecified location PCP recently started on clarithromycin 500 mg twice daily for 5 days. She endorses decrease in acute symptoms since starting clarithromycin (nasal drainage, headache). She has seen various ENTs over the years- Last Dr. Lucia Gaskins 06/2021  Assessment/Plan:   1. Chronic sinusitis, unspecified location Finish antibiotic therapy completely.   Increase water intake.  2. Obesity with current BMI of 36.7 Enrika is currently in the action stage of change. As such, her goal is to continue with weight loss efforts. She has agreed to the Category 3 Plan.   Exercise goals: as is.  Behavioral modification strategies: increasing lean protein intake, decreasing simple carbohydrates, meal planning and cooking strategies, keeping healthy foods in the home, and planning for success.  Oletta has agreed to follow-up with our clinic in 2 weeks. She was informed of the importance of frequent follow-up visits to maximize her success with intensive lifestyle modifications for her  multiple health conditions.    Objective:   Blood pressure 114/70, pulse 85, height '5\' 3"'$  (1.6 m), weight 207 lb (93.9 kg), last menstrual period 02/11/2018, SpO2 97 %. Body mass index is 36.67 kg/m.  General: Cooperative, alert, well developed, in no acute distress. HEENT: Conjunctivae and lids unremarkable. Cardiovascular: Regular rhythm.  Lungs: Normal work of breathing. Neurologic: No focal deficits.   Lab Results  Component Value Date   CREATININE 0.89 03/18/2022   BUN 15 03/18/2022   NA 138 03/18/2022   K 4.2 03/18/2022   CL 100 03/18/2022   CO2 24 03/18/2022   Lab Results  Component Value Date   ALT 23 03/18/2022   AST 22 03/18/2022   ALKPHOS 101 03/18/2022   BILITOT 0.6 03/18/2022   Lab Results  Component Value Date   HGBA1C 5.4 03/18/2022   HGBA1C 5.4 01/14/2022   HGBA1C 5.5 07/31/2021   HGBA1C 5.3 01/27/2021   HGBA1C 5.4 09/26/2020   Lab Results  Component Value Date   INSULIN 10.3 03/18/2022   INSULIN 4.9 01/14/2022   INSULIN 7.0 07/31/2021   INSULIN 8.1 01/27/2021   INSULIN 8.2 09/26/2020   Lab Results  Component Value Date   TSH 1.360 03/19/2020   Lab Results  Component Value Date   CHOL 179 07/31/2021   HDL 53 07/31/2021   LDLCALC 116 (H) 07/31/2021   TRIG 53 07/31/2021   CHOLHDL 3.4 07/31/2021   Lab Results  Component Value Date   VD25OH 48.7 03/18/2022   VD25OH 69.7 01/14/2022   VD25OH 42.2 07/31/2021   Lab Results  Component Value Date  WBC 9.1 11/07/2020   HGB 13.2 11/07/2020   HCT 41.9 11/07/2020   MCV 84.5 11/07/2020   PLT 311 11/07/2020   No results found for: IRON, TIBC, FERRITIN   Attestation Statements:   Reviewed by clinician on day of visit: allergies, medications, problem list, medical history, surgical history, family history, social history, and previous encounter notes.  Time spent on visit including pre-visit chart review and post-visit care and charting was 27 minutes.   I, Georgianne Fick, FNP, am  acting as Location manager for Mina Marble, NP.  I have reviewed the above documentation for accuracy and completeness, and I agree with the above. -  Nghia Mcentee d. Kamalani Mastro, NP-C

## 2022-04-29 MED ORDER — TRIAMCINOLONE ACETONIDE 40 MG/ML IJ SUSP
40.0000 mg | INTRAMUSCULAR | Status: AC | PRN
  Administered 2022-04-21: 40 mg via INTRA_ARTICULAR

## 2022-04-29 MED ORDER — BUPIVACAINE HCL 0.5 % IJ SOLN
5.0000 mL | INTRAMUSCULAR | Status: AC | PRN
  Administered 2022-04-21: 5 mL via INTRA_ARTICULAR

## 2022-05-07 ENCOUNTER — Encounter (INDEPENDENT_AMBULATORY_CARE_PROVIDER_SITE_OTHER): Payer: Self-pay | Admitting: Family Medicine

## 2022-05-07 ENCOUNTER — Ambulatory Visit (INDEPENDENT_AMBULATORY_CARE_PROVIDER_SITE_OTHER): Payer: 59 | Admitting: Adult Health

## 2022-05-07 ENCOUNTER — Ambulatory Visit (INDEPENDENT_AMBULATORY_CARE_PROVIDER_SITE_OTHER): Payer: 59 | Admitting: Family Medicine

## 2022-05-07 VITALS — BP 119/78 | HR 69 | Temp 98.7°F | Ht 63.0 in | Wt 212.0 lb

## 2022-05-07 DIAGNOSIS — Z7984 Long term (current) use of oral hypoglycemic drugs: Secondary | ICD-10-CM

## 2022-05-07 DIAGNOSIS — E559 Vitamin D deficiency, unspecified: Secondary | ICD-10-CM | POA: Diagnosis not present

## 2022-05-07 DIAGNOSIS — E8881 Metabolic syndrome: Secondary | ICD-10-CM | POA: Diagnosis not present

## 2022-05-07 DIAGNOSIS — Z6837 Body mass index (BMI) 37.0-37.9, adult: Secondary | ICD-10-CM

## 2022-05-07 DIAGNOSIS — E669 Obesity, unspecified: Secondary | ICD-10-CM

## 2022-05-07 MED ORDER — VITAMIN D (ERGOCALCIFEROL) 1.25 MG (50000 UNIT) PO CAPS
50000.0000 [IU] | ORAL_CAPSULE | ORAL | 0 refills | Status: DC
Start: 2022-05-07 — End: 2022-06-17

## 2022-05-16 NOTE — Progress Notes (Unsigned)
Chief Complaint:   OBESITY Lauren Garza is here to discuss her progress with her obesity treatment plan along with follow-up of her obesity related diagnoses. Lauren Garza is on the Category 3 Plan and states she is following her eating plan approximately 60% of the time. Lauren Garza states she is riding her stationary bike 20-30 minutes 7 times per week.  Today's visit was #: 67 Starting weight: 237 lbs Starting date: 04/202/2021 Today's weight: 212 lbs Today's date: 06/08/02023 Total lbs lost to date: 25 lbs Total lbs lost since last in-office visit: 0  Interim History: Lauren Garza has been struggling to follow meal plan lately.  "It's too much food".  She has gained 5 lbs since last office visit.  She has ate off plan more with more simple carbohydrates  Increased fat mass 7.8 lbs and lost muscle mass.    Subjective:   1. Insulin resistance Lauren Garza's PCP gave her samples of Rybelsus 3 mg, which she recently just started.  She is not sure if they are helping or not.  2. Vitamin D deficiency She is currently taking prescription vitamin D 50,000 IU each week. She denies nausea, vomiting or muscle weakness.  Last vitamin d level 03/18/2022, it was 48.7  Assessment/Plan:  No orders of the defined types were placed in this encounter.   Medications Discontinued During This Encounter  Medication Reason   Vitamin D, Ergocalciferol, (DRISDOL) 1.25 MG (50000 UNIT) CAPS capsule Reorder     Meds ordered this encounter  Medications   Vitamin D, Ergocalciferol, (DRISDOL) 1.25 MG (50000 UNIT) CAPS capsule    Sig: Take 1 capsule (50,000 Units total) by mouth every 7 (seven) days.    Dispense:  8 capsule    Refill:  0     1. Insulin resistance Continue Rybelsus per her PCP.  Do not recommend increase in dose since she cannot get the medication on a regular basis each month.  Decrease simple carbohydrates and increase proteins by following prudent nutritional plan.   2. Vitamin D  deficiency Low Vitamin D level contributes to fatigue and are associated with obesity, breast, and colon cancer. She agrees to continue to take prescription Vitamin D '@50'$ ,000 IU every week and will follow-up for routine testing of Vitamin D, at least 2-3 times per year to avoid over-replacement. Continue same dose.   Refill - Vitamin D, Ergocalciferol, (DRISDOL) 1.25 MG (50000 UNIT) CAPS capsule; Take 1 capsule (50,000 Units total) by mouth every 7 (seven) days.  Dispense: 8 capsule; Refill: 0  3. Obesity, Current BMI 37.7 Lauren Garza is currently in the action stage of change. As such, her goal is to continue with weight loss efforts. She has agreed to the Category 2 Plan with 6 oz protein at lunch, 8-10 oz protein, plus add 1 plain greek yogurt.   Exercise goals: As is   Behavioral modification strategies: avoiding temptations and planning for success.  Lauren Garza has agreed to follow-up with our clinic in 2 weeks. She was informed of the importance of frequent follow-up visits to maximize her success with intensive lifestyle modifications for her multiple health conditions.   Objective:   Blood pressure 119/78, pulse 69, temperature 98.7 F (37.1 C), height '5\' 3"'$  (1.6 m), weight 212 lb (96.2 kg), last menstrual period 02/11/2018, SpO2 100 %. Body mass index is 37.55 kg/m.  General: Cooperative, alert, well developed, in no acute distress. HEENT: Conjunctivae and lids unremarkable. Cardiovascular: Regular rhythm.  Lungs: Normal work of breathing. Neurologic: No focal deficits.  Lab Results  Component Value Date   CREATININE 0.89 03/18/2022   BUN 15 03/18/2022   NA 138 03/18/2022   K 4.2 03/18/2022   CL 100 03/18/2022   CO2 24 03/18/2022   Lab Results  Component Value Date   ALT 23 03/18/2022   AST 22 03/18/2022   ALKPHOS 101 03/18/2022   BILITOT 0.6 03/18/2022   Lab Results  Component Value Date   HGBA1C 5.4 03/18/2022   HGBA1C 5.4 01/14/2022   HGBA1C 5.5 07/31/2021    HGBA1C 5.3 01/27/2021   HGBA1C 5.4 09/26/2020   Lab Results  Component Value Date   INSULIN 10.3 03/18/2022   INSULIN 4.9 01/14/2022   INSULIN 7.0 07/31/2021   INSULIN 8.1 01/27/2021   INSULIN 8.2 09/26/2020   Lab Results  Component Value Date   TSH 1.360 03/19/2020   Lab Results  Component Value Date   CHOL 179 07/31/2021   HDL 53 07/31/2021   LDLCALC 116 (H) 07/31/2021   TRIG 53 07/31/2021   CHOLHDL 3.4 07/31/2021   Lab Results  Component Value Date   VD25OH 48.7 03/18/2022   VD25OH 69.7 01/14/2022   VD25OH 42.2 07/31/2021   Lab Results  Component Value Date   WBC 9.1 11/07/2020   HGB 13.2 11/07/2020   HCT 41.9 11/07/2020   MCV 84.5 11/07/2020   PLT 311 11/07/2020   No results found for: "IRON", "TIBC", "FERRITIN"  Attestation Statements:   Reviewed by clinician on day of visit: allergies, medications, problem list, medical history, surgical history, family history, social history, and previous encounter notes.  I, Davy Pique, am acting as Location manager for [employer], DO.  I have reviewed the above documentation for accuracy and completeness, and I agree with the above. Marjory Sneddon, D.O.  [place] was signed into law in 2016 which includes the topic of electronic health records.  This provides immediate access to information in MyChart.  This includes consultation notes, operative notes, office notes, lab results and pathology reports.  If you have any questions about what you read please let us know at your next visit so we can discuss your concerns and take corrective action if need be.  We are right here with you.

## 2022-05-21 ENCOUNTER — Encounter (INDEPENDENT_AMBULATORY_CARE_PROVIDER_SITE_OTHER): Payer: Self-pay | Admitting: Adult Health

## 2022-05-21 ENCOUNTER — Ambulatory Visit (INDEPENDENT_AMBULATORY_CARE_PROVIDER_SITE_OTHER): Payer: 59 | Admitting: Adult Health

## 2022-05-21 VITALS — BP 111/74 | HR 74 | Temp 98.3°F | Ht 63.0 in | Wt 209.0 lb

## 2022-05-21 DIAGNOSIS — E8881 Metabolic syndrome: Secondary | ICD-10-CM | POA: Diagnosis not present

## 2022-05-21 DIAGNOSIS — Z7984 Long term (current) use of oral hypoglycemic drugs: Secondary | ICD-10-CM

## 2022-05-21 DIAGNOSIS — Z6837 Body mass index (BMI) 37.0-37.9, adult: Secondary | ICD-10-CM

## 2022-05-21 DIAGNOSIS — E669 Obesity, unspecified: Secondary | ICD-10-CM

## 2022-05-25 ENCOUNTER — Telehealth: Payer: Self-pay | Admitting: Physical Medicine and Rehabilitation

## 2022-05-25 NOTE — Progress Notes (Signed)
Chief Complaint:   OBESITY Lauren Garza is here to discuss her progress with her obesity treatment plan along with follow-up of her obesity related diagnoses. Lauren Garza is on the Category 2 Plan with 6 oz of protein at lunch and 8/10 oz of protein at supper + add 1 plain [place] yogurt and states she is following her eating plan approximately 50% of the time. Lauren Garza states she is doing 0 minutes 0 times per week.  Today's visit was #: 70 Starting weight: 237 lbs Starting date: 03/19/2020 Today's weight: 209 lbs Today's date: 05/21/2022 Total lbs lost to date: 28 Total lbs lost since last in-office visit: 3  Interim History:  Lauren Garza's PCP provided her 78-monthsupply of Rybelsus 3 mg.   She started oral GLP-1 therapy on 04/27/2022-consistently taking for 1 week, then following week inconsistent, then stopped her medication.   Last Rybelsus use was on 04/29/2022.    Bioimpedance results reviewed with patient:  muscle mass + 3 pounds adipose mass-6.6 lbs.   Subjective:   1. Insulin resistance Lauren Garza's PCP provided her 339-monthupply of Rybelsus 3 mg.   She started oral GLP-1 therapy on 04/27/2022-consistently taking for 1 week, then following week inconsistent, then stopped her medication.   Last Rybelsus use was on 04/29/2022.    Assessment/Plan:   1. Insulin resistance Lauren Garza continue daily Rybelsus 3 mg every morning.  2. Obesity current BMI-37.1 Lauren Garza currently in the action stage of change. As such, her goal is to continue with weight loss efforts. She has agreed to the Category 2 Plan.   Exercise goals: No exercise has been prescribed at this time.  Behavioral modification strategies: increasing lean protein intake, decreasing simple carbohydrates, meal planning and cooking strategies, keeping healthy foods in the home, and planning for success.  Lauren Garza agreed to follow-up with our clinic in 2 weeks. She was informed of the importance of frequent  follow-up visits to maximize her success with intensive lifestyle modifications for her multiple health conditions.   Objective:   Blood pressure 111/74, pulse 74, temperature 98.3 F (36.8 C), height '5\' 3"'$  (1.6 m), weight 209 lb (94.8 kg), last menstrual period 02/11/2018, SpO2 94 %. Body mass index is 37.02 kg/m.  General: Cooperative, alert, well developed, in no acute distress. HEENT: Conjunctivae and lids unremarkable. Cardiovascular: Regular rhythm.  Lungs: Normal work of breathing. Neurologic: No focal deficits.   Lab Results  Component Value Date   CREATININE 0.89 03/18/2022   BUN 15 03/18/2022   NA 138 03/18/2022   K 4.2 03/18/2022   CL 100 03/18/2022   CO2 24 03/18/2022   Lab Results  Component Value Date   ALT 23 03/18/2022   AST 22 03/18/2022   ALKPHOS 101 03/18/2022   BILITOT 0.6 03/18/2022   Lab Results  Component Value Date   HGBA1C 5.4 03/18/2022   HGBA1C 5.4 01/14/2022   HGBA1C 5.5 07/31/2021   HGBA1C 5.3 01/27/2021   HGBA1C 5.4 09/26/2020   Lab Results  Component Value Date   INSULIN 10.3 03/18/2022   INSULIN 4.9 01/14/2022   INSULIN 7.0 07/31/2021   INSULIN 8.1 01/27/2021   INSULIN 8.2 09/26/2020   Lab Results  Component Value Date   TSH 1.360 03/19/2020   Lab Results  Component Value Date   CHOL 179 07/31/2021   HDL 53 07/31/2021   LDLCALC 116 (H) 07/31/2021   TRIG 53 07/31/2021   CHOLHDL 3.4 07/31/2021   Lab Results  Component Value Date  VD25OH 48.7 03/18/2022   VD25OH 69.7 01/14/2022   VD25OH 42.2 07/31/2021   Lab Results  Component Value Date   WBC 9.1 11/07/2020   HGB 13.2 11/07/2020   HCT 41.9 11/07/2020   MCV 84.5 11/07/2020   PLT 311 11/07/2020   No results found for: "IRON", "TIBC", "FERRITIN"  Attestation Statements:   Reviewed by clinician on day of visit: allergies, medications, problem list, medical history, surgical history, family history, social history, and previous encounter notes.   Lauren Garza, am acting as transcriptionist for Mina Marble, NP.  I have reviewed the above documentation for accuracy and completeness, and I agree with the above. -  Lauren Garza d. Lauren Mantei, NP-C

## 2022-05-27 ENCOUNTER — Ambulatory Visit (INDEPENDENT_AMBULATORY_CARE_PROVIDER_SITE_OTHER): Payer: 59 | Admitting: Physical Medicine and Rehabilitation

## 2022-05-27 ENCOUNTER — Encounter: Payer: Self-pay | Admitting: Physical Medicine and Rehabilitation

## 2022-05-27 VITALS — BP 114/79 | HR 92

## 2022-05-27 DIAGNOSIS — M25552 Pain in left hip: Secondary | ICD-10-CM

## 2022-05-27 DIAGNOSIS — M25551 Pain in right hip: Secondary | ICD-10-CM

## 2022-05-27 DIAGNOSIS — M7062 Trochanteric bursitis, left hip: Secondary | ICD-10-CM

## 2022-05-27 DIAGNOSIS — M7061 Trochanteric bursitis, right hip: Secondary | ICD-10-CM | POA: Diagnosis not present

## 2022-05-27 NOTE — Progress Notes (Signed)
Lauren Garza - 41 y.o. female MRN [id number]  Date of birth: Feb 07, 1981  Office Visit Note: Visit Date: 05/27/2022 PCP: Lin Landsman, MD Referred by: Lin Landsman, MD  Subjective: Chief Complaint  Patient presents with   Lower Back - Pain   Right Hip - Pain   Left Hip - Pain   Right Leg - Pain   Left Leg - Pain   HPI: Lauren Garza is a 41 y.o. female who comes in today for evaluation of chronic, worsening and severe bilateral hip and lateral thigh pain. Patient reports pain has been ongoing for several years and is exacerbated by laying on sides, prolonged standing and prolonged sitting. She also reports burning and tenderness to bilateral buttocks, thighs and legs. Patient describes her pain as a constant sore, aching and tender sensation, currently rates as 7 out of 10. Patient states she works at [employer] and her job requires frequent driving, states she has to get out of car and take breaks frequently. Also reports she has to change positions frequently during her sleep due to severe discomfort. Patient states she has tried home exercise regimen, rest and use of over the counter medications, however these therapies did not help to alleviate her pain. Patient has attended formal physical therapy in the past in [facility] with [facility], states no relief of pain with these treatments. Patient's lumbar MRI imaging from 2021 exhibits mild degenerative disc disease at L3-L4 without stenosis or impingement and mild facet hypertrophy at the levels of L4-L5 and L5-S1. No high grade spinal canal stenosis noted. Patient has had multiple bilateral greater trochanter injections performed in our office over the years, most recent was on 04/21/2022, reports 60% relief of pain for approximately one and a half weeks with this procedure. Patient was previously treated by Dr. Sarina Ill at [facility], she was thought to have meralgia paresthetica based on symptoms of  burning pain to bilateral thigh regions and risk factor of morbid obesity. Patient has not been re-evaluated by Dr. Jaynee Eagles recently. Patient reports pain is negatively impacting her daily life and is making it difficult for her to perform job duties. Patient denies focal weakness, numbness and tingling. Patient denies recent trauma or falls.    Review of Systems  Musculoskeletal:  Positive for joint pain and myalgias.  Neurological:  Negative for tingling, sensory change, focal weakness and weakness.  All other systems reviewed and are negative.  Otherwise per HPI.  Assessment & Plan: Visit Diagnoses:    ICD-10-CM   1. Greater trochanteric bursitis, left  M70.62 Ambulatory referral to Physical Medicine Rehab    2. Greater trochanteric bursitis, right  M70.61 Ambulatory referral to Physical Medicine Rehab    3. Bilateral hip pain  M25.551 Ambulatory referral to Physical Medicine Rehab   M25.552        Plan: Findings:  Chronic, worsening and severe bilateral hip and lateral thigh pain. Also she continues to experience burning and tenderness to bilateral buttocks, thighs and legs. Patient continues to have severe pain despite good conservative therapies such as formal physical therapy, home exercise regimen, rest and use of medications. Patient's clinical presentation and exam are consistent with bilateral greater trochanteric bursitis. I also feel patient could have some type of central sensitization syndrome such as fibromyalgia contributing to her pain. Patient did get good relief of pain with previous bilateral greater trochanter injections. We believe the next step is to repeat bilateral greater trochanter injections under fluoroscopic guidance.  If patient's pain persists post injections we did discuss regrouping with our in house physical therapy team. Patient encouraged to remain active and to continue with home exercise regimen as tolerated. No red flag symptoms noted upon exam today.      Meds & Orders: No orders of the defined types were placed in this encounter.   Orders Placed This Encounter  Procedures   Ambulatory referral to Physical Medicine Rehab    Follow-up: Return for Bilateral greater trochanter injections.   Procedures: No procedures performed      Clinical History: No specialty comments available.   She reports that she has quit smoking. She has never used smokeless tobacco.  Recent Labs    07/31/21 1015 01/14/22 1046 03/18/22 1220  HGBA1C 5.5 5.4 5.4    Objective:  VS:  HT:    WT:   BMI:     BP:114/79  HR:92bpm  TEMP: ( )  RESP:  Physical Exam Vitals and nursing note reviewed.  HENT:     Head: Normocephalic and atraumatic.     Right Ear: External ear normal.     Left Ear: External ear normal.     Nose: Nose normal.     Mouth/Throat:     Mouth: Mucous membranes are moist.  Eyes:     Extraocular Movements: Extraocular movements intact.  Cardiovascular:     Rate and Rhythm: Normal rate.     Pulses: Normal pulses.  Pulmonary:     Effort: Pulmonary effort is normal.  Abdominal:     General: Abdomen is flat. There is no distension.  Musculoskeletal:        General: Tenderness present.     Cervical back: Normal range of motion.     Comments: Pt rises from seated position to standing without difficulty. Good lumbar range of motion. Strong distal strength without clonus, pain upon palpation of greater trochanters. Sensation intact bilaterally. Walks independently, gait steady.   Skin:    General: Skin is warm and dry.     Capillary Refill: Capillary refill takes less than 2 seconds.  Neurological:     General: No focal deficit present.     Mental Status: She is alert and oriented to person, place, and time.  Psychiatric:        Mood and Affect: Mood normal.        Behavior: Behavior normal.     Ortho Exam  Imaging: No results found.  Past Medical/Family/Surgical/Social History: Medications & Allergies reviewed per EMR,  new medications updated. Patient Active Problem List   Diagnosis Date Noted   Environmental and seasonal allergies 09/15/2021   At risk for side effect of medication 04/01/2021   Chronic sinusitis 02/25/2021   Transaminitis 02/11/2021   History of tooth extraction 12/23/2020   Insomnia 12/23/2020   Hemorrhagic ovarian cyst left 11/12/2020   SOB (shortness of breath) on exertion 09/26/2020   Vitamin B12 deficiency 08/19/2020   Other hyperlipidemia, pure 08/19/2020   Vitamin D deficiency 05/21/2020   Asthma 05/21/2020   Insulin resistance 05/06/2020   Meralgia paresthetica of both lower extremities 01/16/2020   Class 3 severe obesity with serious comorbidity and body mass index (BMI) of 40.0 to 44.9 in adult [facility]) 01/16/2020   Abnormal laboratory test 03/09/2018   Kidney stone on left side 02/06/2018   S/P vaginal hysterectomy 02/01/2018   Generalized headaches 10/25/2017   Past Medical History:  Diagnosis Date   Abnormal Pap smear    Acid reflux    Allergies  Asthma    Bacterial vaginosis    Bursitis    Chlamydia trachomatis infection 2012   Constipation    Depression    DUB (dysfunctional uterine bleeding)    Family history of adverse reaction to anesthesia    mom has n and V past surgery   Frequent headaches    migraines   Hypertension    "not anymore", taken off BP meds per pt report   Meralgia paresthetica    Plantar fasciitis    right    Family History  Problem Relation Age of Onset   Heart attack Father    Hypertension Mother    Diabetes Mother    Other Mother        questionable nerve issue, has numbness in thighs too   Sleep apnea Mother    Obesity Mother    Diabetes Maternal Grandmother    Past Surgical History:  Procedure Laterality Date   GYNECOLOGIC CRYOSURGERY     I & D EXTREMITY Right 04/04/2015   Procedure: IRRIGATION AND DEBRIDEMENT RIGHT WRIST;  Surgeon: Leanora Cover, MD;  Location: [place];  Service: Orthopedics;  Laterality: Right;   SINUS  ENDO WITH FUSION N/A 01/25/2017   Procedure: SINUS ENDO WITH FUSION;  Surgeon: Melida Quitter, MD;  Location: [place];  Service: ENT;  Laterality: N/A;   TUBAL LIGATION     VAGINAL HYSTERECTOMY Bilateral 02/01/2018   Procedure: HYSTERECTOMY VAGINAL WITH SALPINGECTOMY;  Surgeon: Chancy Milroy, MD;  Location: [facility];  Service: Gynecology;  Laterality: Bilateral;   WISDOM TOOTH EXTRACTION     WRIST SURGERY     Social History   Occupational History   Occupation: Pensions consultant  Tobacco Use   Smoking status: Former   Smokeless tobacco: Never   Tobacco comments:    rare smoker 9 years ago  Vaping Use   Vaping Use: Never used  Substance and Sexual Activity   Alcohol use: Yes    Comment: Rare, 1x year   Drug use: No   Sexual activity: Yes    Birth control/protection: Surgical

## 2022-05-27 NOTE — Progress Notes (Signed)
Pt state lower back pain that travels to her buttocks, both his and legs. Pt state standing and walking makes the pain worse. Pt state she takes over the counter pain meds to help ease his pain.  Numeric Pain Rating Scale and Functional Assessment Average Pain 10 Pain Right Now 7 My pain is constant, dull, and aching Pain is worse with: walking, standing, and some activites Pain improves with: rest, medication, and injections   In the last MONTH (on 0-10 scale) has pain interfered with the following?  1. General activity like being  able to carry out your everyday physical activities such as walking, climbing stairs, carrying groceries, or moving a chair?  Rating(5)  2. Relation with others like being able to carry out your usual social activities and roles such as  activities at home, at work and in your community. Rating(6)  3. Enjoyment of life such that you have  been bothered by emotional problems such as feeling anxious, depressed or irritable?  Rating(7)

## 2022-06-04 ENCOUNTER — Encounter (INDEPENDENT_AMBULATORY_CARE_PROVIDER_SITE_OTHER): Payer: Self-pay | Admitting: Family Medicine

## 2022-06-04 ENCOUNTER — Ambulatory Visit (INDEPENDENT_AMBULATORY_CARE_PROVIDER_SITE_OTHER): Payer: 59 | Admitting: Family Medicine

## 2022-06-04 VITALS — BP 122/83 | HR 90 | Temp 98.7°F | Ht 63.0 in | Wt 212.0 lb

## 2022-06-04 DIAGNOSIS — E559 Vitamin D deficiency, unspecified: Secondary | ICD-10-CM | POA: Diagnosis not present

## 2022-06-04 DIAGNOSIS — J302 Other seasonal allergic rhinitis: Secondary | ICD-10-CM | POA: Diagnosis not present

## 2022-06-04 DIAGNOSIS — E669 Obesity, unspecified: Secondary | ICD-10-CM

## 2022-06-04 DIAGNOSIS — J3089 Other allergic rhinitis: Secondary | ICD-10-CM

## 2022-06-04 DIAGNOSIS — Z6837 Body mass index (BMI) 37.0-37.9, adult: Secondary | ICD-10-CM

## 2022-06-04 DIAGNOSIS — E8881 Metabolic syndrome: Secondary | ICD-10-CM

## 2022-06-04 MED ORDER — LEVOCETIRIZINE DIHYDROCHLORIDE 5 MG PO TABS: 5.0000 mg | ORAL_TABLET | Freq: Every evening | ORAL | 0 refills | Status: DC

## 2022-06-06 NOTE — Progress Notes (Unsigned)
Chief Complaint:   OBESITY Lauren Garza is here to discuss her progress with her obesity treatment plan along with follow-up of her obesity related diagnoses. [name] is on the Category 2 Plan and states she is following her eating plan approximately 50% of the time. [name] states she is not exercising.  Today's visit was #: 32 Starting weight: 237 lbs Starting date: 03/19/2020 Today's weight: 212 lbs Today's date: 06/04/2022 Total lbs lost to date: 25 lbs Total lbs lost since last in-office visit: +3  Interim History: Lauren Garza voices that it has been difficult to stay on plan lately.  "It's been a lot going on."  She has declined to journal.   Subjective:   1. Insulin resistance Last fasting insulin increased to 10.3 on 03/18/22.  It is up from 4.9 on 01/14/2022.  2. Environmental and seasonal allergies Symptoms much better controlled on Xyzal and Singulair combined.  3. Vitamin D deficiency She is currently taking prescription vitamin D 50,000 IU each week. She denies nausea, vomiting or muscle weakness. EMREE LOCICERO is tolerating medication(s) well without side effects.  Medication compliance is good as patient endorses taking it as prescribed.  The patient denies additional concerns regarding this condition.  Last Vitamin D level was at 48.7, at goal on 03/18/2022.  Assessment/Plan:  No orders of the defined types were placed in this encounter.   Medications Discontinued During This Encounter  Medication Reason   levocetirizine (XYZAL) 5 MG tablet Reorder     Meds ordered this encounter  Medications   levocetirizine (XYZAL) 5 MG tablet    Sig: Take 1 tablet (5 mg total) by mouth every evening.    Dispense:  30 tablet    Refill:  0    30 d supply;  ** OV for RF **   Do not send RF request     1. Insulin resistance Kalani will continue to work on weight loss, exercise, and decreasing simple carbohydrates to help decrease the risk of diabetes.  Talia agreed to follow-up with [place] as directed to closely monitor her progress.  Educated Harini on insulin resistance again and encouraged her to continue prudent nutritional plan, increase exercise and weight loss.   2. Environmental and seasonal allergies Continue Singulair and refill Xyzal.  Preventative strategies discussed with Lauren Garza.  Refill - levocetirizine (XYZAL) 5 MG tablet; Take 1 tablet (5 mg total) by mouth every evening.  Dispense: 30 tablet; Refill: 0  3. Vitamin D deficiency Low Vitamin D level contributes to fatigue and are associated with obesity, breast, and colon cancer. She agrees to continue to take prescription Vitamin D '@50'$ ,000 IU every week and will follow-up for routine testing of Vitamin D, at least 2-3 times per year to avoid over-replacement. Continue Ergocalciferol, denies need for refill today.   4. Obesity current BMI-37.6 Long discussion with Lauren Garza about healthy ways to make things she loves to eat. Ie) barbeque chicken, spaghetti, alternatives ie) black bean spaghetti, etc.   Lauren Garza is currently in the action stage of change. As such, her goal is to continue with weight loss efforts. She has agreed to the Category 2 Plan.   Exercise goals:  As is.   Behavioral modification strategies: avoiding temptations and planning for success.  Lauren Garza has agreed to follow-up with our clinic in 3 weeks. She was informed of the importance of frequent follow-up visits to maximize her success with intensive lifestyle modifications for her multiple health conditions.   Objective:   Blood pressure 122/83,  pulse 90, temperature 98.7 F (37.1 C), height '5\' 3"'$  (1.6 m), weight 212 lb (96.2 kg), last menstrual period 02/11/2018, SpO2 100 %. Body mass index is 37.55 kg/m.  General: Cooperative, alert, well developed, in no acute distress. HEENT: Conjunctivae and lids unremarkable. Cardiovascular: Regular rhythm.  Lungs: Normal work of  breathing. Neurologic: No focal deficits.   Lab Results  Component Value Date   CREATININE 0.89 03/18/2022   BUN 15 03/18/2022   NA 138 03/18/2022   K 4.2 03/18/2022   CL 100 03/18/2022   CO2 24 03/18/2022   Lab Results  Component Value Date   ALT 23 03/18/2022   AST 22 03/18/2022   ALKPHOS 101 03/18/2022   BILITOT 0.6 03/18/2022   Lab Results  Component Value Date   HGBA1C 5.4 03/18/2022   HGBA1C 5.4 01/14/2022   HGBA1C 5.5 07/31/2021   HGBA1C 5.3 01/27/2021   HGBA1C 5.4 09/26/2020   Lab Results  Component Value Date   INSULIN 10.3 03/18/2022   INSULIN 4.9 01/14/2022   INSULIN 7.0 07/31/2021   INSULIN 8.1 01/27/2021   INSULIN 8.2 09/26/2020   Lab Results  Component Value Date   TSH 1.360 03/19/2020   Lab Results  Component Value Date   CHOL 179 07/31/2021   HDL 53 07/31/2021   LDLCALC 116 (H) 07/31/2021   TRIG 53 07/31/2021   CHOLHDL 3.4 07/31/2021   Lab Results  Component Value Date   VD25OH 48.7 03/18/2022   VD25OH 69.7 01/14/2022   VD25OH 42.2 07/31/2021   Lab Results  Component Value Date   WBC 9.1 11/07/2020   HGB 13.2 11/07/2020   HCT 41.9 11/07/2020   MCV 84.5 11/07/2020   PLT 311 11/07/2020   No results found for: "IRON", "TIBC", "FERRITIN"  Attestation Statements:   Reviewed by clinician on day of visit: allergies, medications, problem list, medical history, surgical history, family history, social history, and previous encounter notes.  I, Davy Pique, RMA, am acting as Location manager for [employer], DO.  I have reviewed the above documentation for accuracy and completeness, and I agree with the above. Marjory Sneddon, D.O.  The [place] was signed into law in 2016 which includes the topic of electronic health records.  This provides immediate access to information in MyChart.  This includes consultation notes, operative notes, office notes, lab results and pathology reports.  If you have any questions about  what you read please let us know at your next visit so we can discuss your concerns and take corrective action if need be.  We are right here with you.

## 2022-06-17 ENCOUNTER — Encounter (INDEPENDENT_AMBULATORY_CARE_PROVIDER_SITE_OTHER): Payer: Self-pay | Admitting: Family Medicine

## 2022-06-17 ENCOUNTER — Ambulatory Visit (INDEPENDENT_AMBULATORY_CARE_PROVIDER_SITE_OTHER): Payer: 59 | Admitting: Family Medicine

## 2022-06-17 VITALS — BP 117/76 | HR 77 | Temp 98.2°F | Ht 63.0 in | Wt 212.0 lb

## 2022-06-17 DIAGNOSIS — Z6837 Body mass index (BMI) 37.0-37.9, adult: Secondary | ICD-10-CM

## 2022-06-17 DIAGNOSIS — E669 Obesity, unspecified: Secondary | ICD-10-CM

## 2022-06-17 DIAGNOSIS — F430 Acute stress reaction: Secondary | ICD-10-CM

## 2022-06-17 DIAGNOSIS — J3089 Other allergic rhinitis: Secondary | ICD-10-CM

## 2022-06-17 DIAGNOSIS — E559 Vitamin D deficiency, unspecified: Secondary | ICD-10-CM

## 2022-06-17 MED ORDER — VITAMIN D (ERGOCALCIFEROL) 1.25 MG (50000 UNIT) PO CAPS: 50000.0000 [IU] | ORAL_CAPSULE | ORAL | 0 refills | Status: DC

## 2022-06-17 MED ORDER — LEVOCETIRIZINE DIHYDROCHLORIDE 5 MG PO TABS: 5.0000 mg | ORAL_TABLET | Freq: Every evening | ORAL | 0 refills | Status: DC

## 2022-06-19 DIAGNOSIS — F430 Acute stress reaction: Secondary | ICD-10-CM | POA: Insufficient documentation

## 2022-06-19 NOTE — Progress Notes (Unsigned)
Chief Complaint:   OBESITY Lauren Garza is here to discuss her progress with her obesity treatment plan along with follow-up of her obesity related diagnoses. Lauren Garza is on the Category 2 Plan and states she is following her eating plan approximately 50% of the time. Lauren Garza states she is not exercising.  Today's visit was #: 50 Starting weight: 237 lbs Starting date: 03/19/2020 Today's weight: 212 lbs Today's date:  06/17/2022 Total lbs lost to date: 25 lbs Total lbs lost since last in-office visit: 0  Interim History: Lauren Garza has not been eating much due to emotional distress.  She is not ready to discuss what that is.   She denies side effects or being in harms way and denies emotional or physical abuse.  Subjective:   1. Vitamin D deficiency She is currently taking prescription vitamin D 50,000 IU each week. She denies nausea, vomiting or muscle weakness.  2. Environmental and seasonal allergies Lauren Garza's symptoms are well controlled.  3. Acute reaction to stress Increased stress in life lately.  She still has not been eating much at all.  No side effects or issues with feeling helpless.  Has a good support system.   Assessment/Plan:  No orders of the defined types were placed in this encounter.   Medications Discontinued During This Encounter  Medication Reason   Vitamin D, Ergocalciferol, (DRISDOL) 1.25 MG (50000 UNIT) CAPS capsule Reorder   levocetirizine (XYZAL) 5 MG tablet Reorder     Meds ordered this encounter  Medications   Vitamin D, Ergocalciferol, (DRISDOL) 1.25 MG (50000 UNIT) CAPS capsule    Sig: Take 1 capsule (50,000 Units total) by mouth every 7 (seven) days.    Dispense:  4 capsule    Refill:  0   levocetirizine (XYZAL) 5 MG tablet    Sig: Take 1 tablet (5 mg total) by mouth every evening.    Dispense:  90 tablet    Refill:  0     1. Vitamin D deficiency Low Vitamin D level contributes to fatigue and are associated with obesity,  breast, and colon cancer. She agrees to continue to take prescription Vitamin D '@50'$ ,000 IU every week and will follow-up for routine testing of Vitamin D, at least 2-3 times per year to avoid over-replacement.  Refill - Vitamin D, Ergocalciferol, (DRISDOL) 1.25 MG (50000 UNIT) CAPS capsule; Take 1 capsule (50,000 Units total) by mouth every 7 (seven) days.  Dispense: 4 capsule; Refill: 0  2. Environmental and seasonal allergies Continue allergy medication to help with symptoms.  Refill- levocetirizine (XYZAL) 5 MG tablet; Take 1 tablet (5 mg total) by mouth every evening.  Dispense: 90 tablet; Refill: 0  3. Acute reaction to stress Lauren Garza handouts of counseling places in the area.  [facility] discussed with Lauren Garza, but will try to focus on self care in the near future.  4. Obesity, Current BMI 37.7 Lauren Garza's goals are:  1)  Bike 10 minutes 2 days per week. 2)  Find counselor, hand out given of provider in the area  Lauren Garza is currently in the action stage of change. As such, her goal is to continue with weight loss efforts. She has agreed to the Category 2 Plan.   Exercise goals: All adults should avoid inactivity. Some physical activity is better than none, and adults who participate in any amount of physical activity gain some health benefits, and for stress management.   Behavioral modification strategies: increasing lean protein intake and planning for success.  Lauren Garza  has agreed to follow-up with our clinic in 3-4 weeks. She was informed of the importance of frequent follow-up visits to maximize her success with intensive lifestyle modifications for her multiple health conditions.   Objective:   Blood pressure 117/76, pulse 77, temperature 98.2 F (36.8 C), height '5\' 3"'$  (1.6 m), weight 212 lb (96.2 kg), last menstrual period 02/11/2018, SpO2 99 %. Body mass index is 37.55 kg/m.  General: Cooperative, alert, well developed, in no acute distress. HEENT:  Conjunctivae and lids unremarkable. Cardiovascular: Regular rhythm.  Lungs: Normal work of breathing. Neurologic: No focal deficits.   Lab Results  Component Value Date   CREATININE 0.89 03/18/2022   BUN 15 03/18/2022   NA 138 03/18/2022   K 4.2 03/18/2022   CL 100 03/18/2022   CO2 24 03/18/2022   Lab Results  Component Value Date   ALT 23 03/18/2022   AST 22 03/18/2022   ALKPHOS 101 03/18/2022   BILITOT 0.6 03/18/2022   Lab Results  Component Value Date   HGBA1C 5.4 03/18/2022   HGBA1C 5.4 01/14/2022   HGBA1C 5.5 07/31/2021   HGBA1C 5.3 01/27/2021   HGBA1C 5.4 09/26/2020   Lab Results  Component Value Date   INSULIN 10.3 03/18/2022   INSULIN 4.9 01/14/2022   INSULIN 7.0 07/31/2021   INSULIN 8.1 01/27/2021   INSULIN 8.2 09/26/2020   Lab Results  Component Value Date   TSH 1.360 03/19/2020   Lab Results  Component Value Date   CHOL 179 07/31/2021   HDL 53 07/31/2021   LDLCALC 116 (H) 07/31/2021   TRIG 53 07/31/2021   CHOLHDL 3.4 07/31/2021   Lab Results  Component Value Date   VD25OH 48.7 03/18/2022   VD25OH 69.7 01/14/2022   VD25OH 42.2 07/31/2021   Lab Results  Component Value Date   WBC 9.1 11/07/2020   HGB 13.2 11/07/2020   HCT 41.9 11/07/2020   MCV 84.5 11/07/2020   PLT 311 11/07/2020   No results found for: "IRON", "TIBC", "FERRITIN"  Attestation Statements:   Reviewed by clinician on day of visit: allergies, medications, problem list, medical history, surgical history, family history, social history, and previous encounter notes.  Time spent on visit including pre-visit chart review and post-visit care and charting was 20 minutes.   I, Davy Pique, RMA, am acting as Location manager for [employer], DO.   I have reviewed the above documentation for accuracy and completeness, and I agree with the above. Lauren Garza, D.O.  The [place] was signed into law in 2016 which includes the topic of electronic health  records.  This provides immediate access to information in MyChart.  This includes consultation notes, operative notes, office notes, lab results and pathology reports.  If you have any questions about what you read please let us know at your next visit so we can discuss your concerns and take corrective action if need be.  We are right here with you.

## 2022-06-24 ENCOUNTER — Ambulatory Visit (INDEPENDENT_AMBULATORY_CARE_PROVIDER_SITE_OTHER): Payer: 59 | Admitting: Physical Medicine and Rehabilitation

## 2022-06-24 ENCOUNTER — Ambulatory Visit: Payer: Self-pay

## 2022-06-24 ENCOUNTER — Encounter: Payer: Self-pay | Admitting: Physical Medicine and Rehabilitation

## 2022-06-24 DIAGNOSIS — M7062 Trochanteric bursitis, left hip: Secondary | ICD-10-CM

## 2022-06-24 DIAGNOSIS — M7061 Trochanteric bursitis, right hip: Secondary | ICD-10-CM | POA: Diagnosis not present

## 2022-06-24 NOTE — Patient Instructions (Signed)

## 2022-06-24 NOTE — Progress Notes (Signed)
Pt state lower back pain that travels to her buttocks, both his and legs. Pt state standing and walking makes the pain worse. Pt state she takes over the counter pain meds to help ease his pain. Pt has hx of inj on 04/21/22 pt state it helped for a week or so and it slowly returned.  Numeric Pain Rating Scale and Functional Assessment Average Pain 7   In the last MONTH (on 0-10 scale) has pain interfered with the following?  1. General activity like being  able to carry out your everyday physical activities such as walking, climbing stairs, carrying groceries, or moving a chair?  Rating(10)   +Driver, -BT, -Dye Allergies.

## 2022-06-24 NOTE — Progress Notes (Signed)
Lauren Garza - 41 y.o. female MRN [id number]  Date of birth: [birth year]  Office Visit Note: Visit Date: 06/24/2022 PCP: Lin Landsman, MD Referred by: Lin Landsman, MD  Subjective: Chief Complaint  Patient presents with   Lower Back - Pain   Right Leg - Pain   Left Leg - Pain   HPI:  Lauren Garza is a 41 y.o. female who comes in today at the request of Barnet Pall, FNP for planned Bilateral greater trochanteric injections with fluoroscopic guidance.  The patient has failed conservative care including home exercise, medications, time and activity modification.  This injection will be diagnostic and hopefully therapeutic.  Please see requesting physician notes for further details and justification.    ROS Otherwise per HPI.  Assessment & Plan: Visit Diagnoses:    ICD-10-CM   1. Greater trochanteric bursitis, left  M70.62 Large Joint Inj: bilateral greater trochanter    XR C-ARM NO REPORT    2. Greater trochanteric bursitis, right  M70.61 Large Joint Inj: bilateral greater trochanter    XR C-ARM NO REPORT      Plan: No additional findings.   Meds & Orders: No orders of the defined types were placed in this encounter.   Orders Placed This Encounter  Procedures   Large Joint Inj: bilateral greater trochanter   XR C-ARM NO REPORT    Follow-up: Return for visit to requesting provider as needed.   Procedures: Large Joint Inj: bilateral greater trochanter on 06/24/2022 8:43 AM Indications: pain and diagnostic evaluation Details: 22 G 3.5 in needle, fluoroscopy-guided lateral approach  Arthrogram: No  Medications (Right): 40 mg triamcinolone acetonide 40 MG/ML; 5 mL bupivacaine 0.25 % Medications (Left): 40 mg triamcinolone acetonide 40 MG/ML; 5 mL bupivacaine 0.25 % Outcome: tolerated well, no immediate complications  There was excellent flow of contrast outlined the greater trochanteric bursa without vascular uptake. Procedure, treatment alternatives, risks  and benefits explained, specific risks discussed. Consent was given by the patient. Immediately prior to procedure a time out was called to verify the correct patient, procedure, equipment, support staff and site/side marked as required. Patient was prepped and draped in the usual sterile fashion.          Clinical History: EXAM: MRI LUMBAR SPINE WITHOUT CONTRAST   TECHNIQUE: Multiplanar, multisequence MR imaging of the lumbar spine was performed. No intravenous contrast was administered.   COMPARISON:  None available.   FINDINGS: Segmentation: Standard. Lowest well-formed disc space labeled the L5-S1 level.   Alignment: Mild dextroscoliosis. Alignment otherwise normal preservation of the normal lumbar lordosis.   Vertebrae: Vertebral body height maintained without evidence for acute or chronic fracture. Bone marrow signal intensity within normal limits. No discrete or worrisome osseous lesions. No abnormal marrow edema.   Conus medullaris and cauda equina: Conus extends to the L1 level. Conus and cauda equina appear normal.   Paraspinal and other soft tissues: Paraspinous soft tissues within normal limits. Visualized visceral structures are normal.   Disc levels:   L1-2:  Unremarkable.   L2-3:  Unremarkable.   L3-4: Disc desiccation with minimal annular disc bulge. No stenosis or impingement.   L4-5: Normal interspace. Mild facet hypertrophy. No stenosis or impingement.   L5-S1: Normal interspace. Mild facet hypertrophy. No stenosis or impingement.   IMPRESSION: 1. Mild degenerative disc disease at L3-4 without stenosis or impingement. 2. Mild facet hypertrophy at L4-5 and L5-S1. 3. Underlying mild dextroscoliosis.     Electronically Signed   By: Pincus Badder.D.  On: 02/24/2020 22:54     Objective:  VS:  HT:    WT:   BMI:     BP:   HR: bpm  TEMP: ( )  RESP:  Physical Exam   Imaging: No results found.

## 2022-06-25 ENCOUNTER — Other Ambulatory Visit (INDEPENDENT_AMBULATORY_CARE_PROVIDER_SITE_OTHER): Payer: Self-pay | Admitting: Family Medicine

## 2022-06-25 DIAGNOSIS — K219 Gastro-esophageal reflux disease without esophagitis: Secondary | ICD-10-CM

## 2022-06-29 MED ORDER — BUPIVACAINE HCL 0.25 % IJ SOLN
5.0000 mL | INTRAMUSCULAR | Status: AC | PRN
  Administered 2022-06-24: 5 mL via INTRA_ARTICULAR

## 2022-06-29 MED ORDER — TRIAMCINOLONE ACETONIDE 40 MG/ML IJ SUSP
40.0000 mg | INTRAMUSCULAR | Status: AC | PRN
  Administered 2022-06-24: 40 mg via INTRA_ARTICULAR

## 2022-07-08 ENCOUNTER — Encounter (INDEPENDENT_AMBULATORY_CARE_PROVIDER_SITE_OTHER): Payer: Self-pay

## 2022-07-09 ENCOUNTER — Encounter (INDEPENDENT_AMBULATORY_CARE_PROVIDER_SITE_OTHER): Payer: Self-pay | Admitting: Adult Health

## 2022-07-09 ENCOUNTER — Ambulatory Visit (INDEPENDENT_AMBULATORY_CARE_PROVIDER_SITE_OTHER): Payer: Commercial Managed Care - HMO | Admitting: Adult Health

## 2022-07-09 VITALS — BP 120/78 | HR 84 | Temp 98.7°F | Ht 63.0 in | Wt 215.0 lb

## 2022-07-09 DIAGNOSIS — M7062 Trochanteric bursitis, left hip: Secondary | ICD-10-CM | POA: Diagnosis not present

## 2022-07-09 DIAGNOSIS — Z7984 Long term (current) use of oral hypoglycemic drugs: Secondary | ICD-10-CM

## 2022-07-09 DIAGNOSIS — E8881 Metabolic syndrome: Secondary | ICD-10-CM

## 2022-07-09 DIAGNOSIS — M7061 Trochanteric bursitis, right hip: Secondary | ICD-10-CM

## 2022-07-09 DIAGNOSIS — K219 Gastro-esophageal reflux disease without esophagitis: Secondary | ICD-10-CM

## 2022-07-09 DIAGNOSIS — E669 Obesity, unspecified: Secondary | ICD-10-CM

## 2022-07-09 DIAGNOSIS — Z6838 Body mass index (BMI) 38.0-38.9, adult: Secondary | ICD-10-CM

## 2022-07-09 MED ORDER — OMEPRAZOLE 20 MG PO CPDR: DELAYED_RELEASE_CAPSULE | ORAL | 5 refills | Status: DC

## 2022-07-16 DIAGNOSIS — M7061 Trochanteric bursitis, right hip: Secondary | ICD-10-CM | POA: Insufficient documentation

## 2022-07-16 DIAGNOSIS — K219 Gastro-esophageal reflux disease without esophagitis: Secondary | ICD-10-CM | POA: Insufficient documentation

## 2022-07-16 NOTE — Progress Notes (Signed)
Chief Complaint:   OBESITY Lauren Garza is here to discuss her progress with her obesity treatment plan along with follow-up of her obesity related diagnoses. Lauren Garza is on the Category 2 Plan and states she is following her eating plan approximately 30% of the time. Lauren Garza states she is doing 0 minutes 0 times per week.  Today's visit was #: 64 Starting weight: 237 lbs Starting date: 03/19/2020 Today's weight: 215 lbs Today's date: 07/09/2022 Total lbs lost to date: 22 Total lbs lost since last in-office visit: 0  Interim History:  This month they will celebrate two family member: Her daughter Lauren Garza ,will turn 49.   Her Lauren Garza, will turn 52, and this is his senior year of high school.  In August she has a cookout and pool party to celebrate both birthdays.   Of Note-Her finances will limit ability to purchase foods on a plan.  Subjective:   1. Insulin resistance Rybelsus 3 mg, PCP provided 3 months of samples.   Lauren Garza restarted Rybelsus 3 mg on 07/04/2022- she reports tolerating oral GLP-1 therapy. Discussed dosing options 1 or 2.  2. Greater trochanteric bursitis of both hips On 06/24/2022 Orthopedic Specialist [facility] Notes: HPI:  Lauren Garza is a 41 y.o. female who comes in today at the request of Barnet Pall, FNP for planned Bilateral greater trochanteric injections with fluoroscopic guidance.  The patient has failed conservative care including home exercise, medications, time and activity modification.  This injection will be diagnostic and hopefully therapeutic.  Please see requesting physician notes for further details and justification.   Procedures: Large Joint Inj: bilateral greater trochanter on 06/24/2022 8:43 AM Indications: pain and diagnostic evaluation Details: 22 G 3.5 in needle, fluoroscopy-guided lateral approach    Follow-up: Return for visit to requesting provider as needed.   3. Gastroesophageal reflux disease, unspecified whether  esophagitis present Lauren Garza's acid reflux is well managed with Prilosec 60 mg from once daily to twice daily.  Dosing depends on severity of symptoms.  Assessment/Plan:   1. Insulin resistance Lauren Garza will continue oral GLP-1 therapy- either one of 2 dosing schedules. 1) Daily Rybelsus '3mg'$  for 90 days. 2) Rybelsus '3mg'$  daily for 30 days and if tolerating well- increase to Rybelsus '3mg'$ - 2 tabs each am for 30 days- will deplete Rybelsus samples quicker with this method.  2. Greater trochanteric bursitis of both hips Lauren Garza will follow-up with orthopedic PRN.  3. Gastroesophageal reflux disease, unspecified whether esophagitis present We will refill Prilosec 60 mg once daily or twice daily.   - omeprazole (PRILOSEC) 20 MG capsule; 1 po qd or BID  Dispense: 60 capsule; Refill: 5  4. Obesity, Current BMI 38.2 Lauren Garza is currently in the action stage of change. As such, her goal is to continue with weight loss efforts. She has agreed to the Category 2 Plan.   Exercise goals: All adults should avoid inactivity. Some physical activity is better than none, and adults who participate in any amount of physical activity gain some health benefits.  Behavioral modification strategies: increasing lean protein intake, decreasing simple carbohydrates, meal planning and cooking strategies, keeping healthy foods in the home, and planning for success.  Lauren Garza has agreed to follow-up with our clinic in 4 weeks. She was informed of the importance of frequent follow-up visits to maximize her success with intensive lifestyle modifications for her multiple health conditions.   Objective:   Blood pressure 120/78, pulse 84, temperature 98.7 F (37.1 C), height '5\' 3"'$  (1.6 m), weight 215  lb (97.5 kg), last menstrual period 02/11/2018, SpO2 100 %. Body mass index is 38.09 kg/m.  General: Cooperative, alert, well developed, in no acute distress. HEENT: Conjunctivae and lids  unremarkable. Cardiovascular: Regular rhythm.  Lungs: Normal work of breathing. Neurologic: No focal deficits.   Lab Results  Component Value Date   CREATININE 0.89 03/18/2022   BUN 15 03/18/2022   NA 138 03/18/2022   K 4.2 03/18/2022   CL 100 03/18/2022   CO2 24 03/18/2022   Lab Results  Component Value Date   ALT 23 03/18/2022   AST 22 03/18/2022   ALKPHOS 101 03/18/2022   BILITOT 0.6 03/18/2022   Lab Results  Component Value Date   HGBA1C 5.4 03/18/2022   HGBA1C 5.4 01/14/2022   HGBA1C 5.5 07/31/2021   HGBA1C 5.3 01/27/2021   HGBA1C 5.4 09/26/2020   Lab Results  Component Value Date   INSULIN 10.3 03/18/2022   INSULIN 4.9 01/14/2022   INSULIN 7.0 07/31/2021   INSULIN 8.1 01/27/2021   INSULIN 8.2 09/26/2020   Lab Results  Component Value Date   TSH 1.360 03/19/2020   Lab Results  Component Value Date   CHOL 179 07/31/2021   HDL 53 07/31/2021   LDLCALC 116 (H) 07/31/2021   TRIG 53 07/31/2021   CHOLHDL 3.4 07/31/2021   Lab Results  Component Value Date   VD25OH 48.7 03/18/2022   VD25OH 69.7 01/14/2022   VD25OH 42.2 07/31/2021   Lab Results  Component Value Date   WBC 9.1 11/07/2020   HGB 13.2 11/07/2020   HCT 41.9 11/07/2020   MCV 84.5 11/07/2020   PLT 311 11/07/2020   No results found for: "IRON", "TIBC", "FERRITIN"  Attestation Statements:   Reviewed by clinician on day of visit: allergies, medications, problem list, medical history, surgical history, family history, social history, and previous encounter notes.   Wilhemena Durie, am acting as transcriptionist for Mina Marble, NP.  I have reviewed the above documentation for accuracy and completeness, and I agree with the above. -  Lauren Garza d. Kennia Vanvorst, NP-C

## 2022-07-22 ENCOUNTER — Ambulatory Visit (INDEPENDENT_AMBULATORY_CARE_PROVIDER_SITE_OTHER): Payer: Commercial Managed Care - HMO | Admitting: Adult Health

## 2022-07-22 ENCOUNTER — Encounter (INDEPENDENT_AMBULATORY_CARE_PROVIDER_SITE_OTHER): Payer: Self-pay | Admitting: Adult Health

## 2022-07-22 VITALS — BP 107/72 | HR 67 | Temp 98.9°F | Ht 63.0 in | Wt 215.0 lb

## 2022-07-22 DIAGNOSIS — Z6838 Body mass index (BMI) 38.0-38.9, adult: Secondary | ICD-10-CM

## 2022-07-22 DIAGNOSIS — E669 Obesity, unspecified: Secondary | ICD-10-CM

## 2022-07-22 DIAGNOSIS — E559 Vitamin D deficiency, unspecified: Secondary | ICD-10-CM | POA: Diagnosis not present

## 2022-07-22 DIAGNOSIS — E8881 Metabolic syndrome: Secondary | ICD-10-CM | POA: Diagnosis not present

## 2022-07-22 MED ORDER — VITAMIN D (ERGOCALCIFEROL) 1.25 MG (50000 UNIT) PO CAPS: 50000.0000 [IU] | ORAL_CAPSULE | ORAL | 0 refills | Status: DC

## 2022-07-25 NOTE — Progress Notes (Unsigned)
Chief Complaint:   OBESITY Lauren Garza is here to discuss her progress with her obesity treatment plan along with follow-up of her obesity related diagnoses. Lauren Garza is on the Category 2 Plan and states she is following her eating plan approximately 50% of the time. Lauren Garza states she is not exercising.  Today's visit was #: 57 Starting weight: 237 lbs Starting date: 03/19/2020 Today's weight: 215 lbs Today's date: 07/22/2022 Total lbs lost to date: 22 lbs Total lbs lost since last in-office visit: 0  Interim History: She increased Rybelsus to 6 mg each morning for the last 2 weeks.  She endorses decreased polyphagia with increased one GLP-1.  Pool party this weekend to celebrate her daughter and her son's birthday.   Subjective:   1. Vitamin D deficiency 03/18/2022 Vitamin D level 48.7  2. Insulin resistance ***  Assessment/Plan:   1. Vitamin D deficiency Refill - Vitamin D, Ergocalciferol, (DRISDOL) 1.25 MG (50000 UNIT) CAPS capsule; Take 1 capsule (50,000 Units total) by mouth every 7 (seven) days.  Dispense: 4 capsule; Refill: 0  2. Insulin resistance Continue Rybelsus per PCP.   3. Obesity, Current BMI 38.2 Check fasting labs at next office visit.   Lauren Garza is currently in the action stage of change. As such, her goal is to continue with weight loss efforts. She has agreed to the Category 2 Plan.   Exercise goals: For substantial health benefits, adults should do at least 150 minutes (2 hours and 30 minutes) a week of moderate-intensity, or 75 minutes (1 hour and 15 minutes) a week of vigorous-intensity aerobic physical activity, or an equivalent combination of moderate- and vigorous-intensity aerobic activity. Aerobic activity should be performed in episodes of at least 10 minutes, and preferably, it should be spread throughout the week.  Behavioral modification strategies: increasing lean protein intake, decreasing simple carbohydrates, meal planning and  cooking strategies, keeping healthy foods in the home, and planning for success.  Lauren Garza has agreed to follow-up with our clinic in 4 weeks FASTING.  She was informed of the importance of frequent follow-up visits to maximize her success with intensive lifestyle modifications for her multiple health conditions.   Objective:   Blood pressure 107/72, pulse 67, temperature 98.9 F (37.2 C), height '5\' 3"'$  (1.6 m), weight 215 lb (97.5 kg), last menstrual period 02/11/2018, SpO2 98 %. Body mass index is 38.09 kg/m.  General: Cooperative, alert, well developed, in no acute distress. HEENT: Conjunctivae and lids unremarkable. Cardiovascular: Regular rhythm.  Lungs: Normal work of breathing. Neurologic: No focal deficits.   Lab Results  Component Value Date   CREATININE 0.89 03/18/2022   BUN 15 03/18/2022   NA 138 03/18/2022   K 4.2 03/18/2022   CL 100 03/18/2022   CO2 24 03/18/2022   Lab Results  Component Value Date   ALT 23 03/18/2022   AST 22 03/18/2022   ALKPHOS 101 03/18/2022   BILITOT 0.6 03/18/2022   Lab Results  Component Value Date   HGBA1C 5.4 03/18/2022   HGBA1C 5.4 01/14/2022   HGBA1C 5.5 07/31/2021   HGBA1C 5.3 01/27/2021   HGBA1C 5.4 09/26/2020   Lab Results  Component Value Date   INSULIN 10.3 03/18/2022   INSULIN 4.9 01/14/2022   INSULIN 7.0 07/31/2021   INSULIN 8.1 01/27/2021   INSULIN 8.2 09/26/2020   Lab Results  Component Value Date   TSH 1.360 03/19/2020   Lab Results  Component Value Date   CHOL 179 07/31/2021   HDL 53 07/31/2021  LDLCALC 116 (H) 07/31/2021   TRIG 53 07/31/2021   CHOLHDL 3.4 07/31/2021   Lab Results  Component Value Date   VD25OH 48.7 03/18/2022   VD25OH 69.7 01/14/2022   VD25OH 42.2 07/31/2021   Lab Results  Component Value Date   WBC 9.1 11/07/2020   HGB 13.2 11/07/2020   HCT 41.9 11/07/2020   MCV 84.5 11/07/2020   PLT 311 11/07/2020   No results found for: "IRON", "TIBC", "FERRITIN"  Attestation  Statements:   Reviewed by clinician on day of visit: allergies, medications, problem list, medical history, surgical history, family history, social history, and previous encounter notes.  I, Davy Pique, RMA, am acting as Location manager for Mina Marble, NP.  I have reviewed the above documentation for accuracy and completeness, and I agree with the above. -  ***

## 2022-08-03 ENCOUNTER — Other Ambulatory Visit (INDEPENDENT_AMBULATORY_CARE_PROVIDER_SITE_OTHER): Payer: Self-pay | Admitting: Family Medicine

## 2022-08-03 DIAGNOSIS — E559 Vitamin D deficiency, unspecified: Secondary | ICD-10-CM

## 2022-08-20 ENCOUNTER — Ambulatory Visit (INDEPENDENT_AMBULATORY_CARE_PROVIDER_SITE_OTHER): Payer: Commercial Managed Care - HMO | Admitting: Adult Health

## 2022-09-06 IMAGING — US US PELVIS COMPLETE TRANSABD/TRANSVAG W DUPLEX
1 series · 13 of 25 positions shown · non-contrast
Comparison: CT abdomen and pelvis 11/07/2020

CLINICAL DATA: LEFT lower quadrant and pelvic pain, abnormal CT
with LEFT ovarian large mint versus mass

EXAM:
TRANSABDOMINAL AND TRANSVAGINAL ULTRASOUND OF PELVIS
DOPPLER ULTRASOUND OF OVARIES
TECHNIQUE: Both transabdominal and transvaginal ultrasound examinations of the
pelvis were performed. Transabdominal technique was performed for
global imaging of the pelvis including uterus, ovaries, adnexal
regions, and pelvic cul-de-sac.
It was necessary to proceed with endovaginal exam following the
transabdominal exam to visualize the adnexa. Color and duplex
Doppler ultrasound was utilized to evaluate blood flow to the
ovaries.

[Series 1: us pelvis complete transabd/transvag w duplex · 58 acquisitions, 13 frames shown]
[im 1/58]
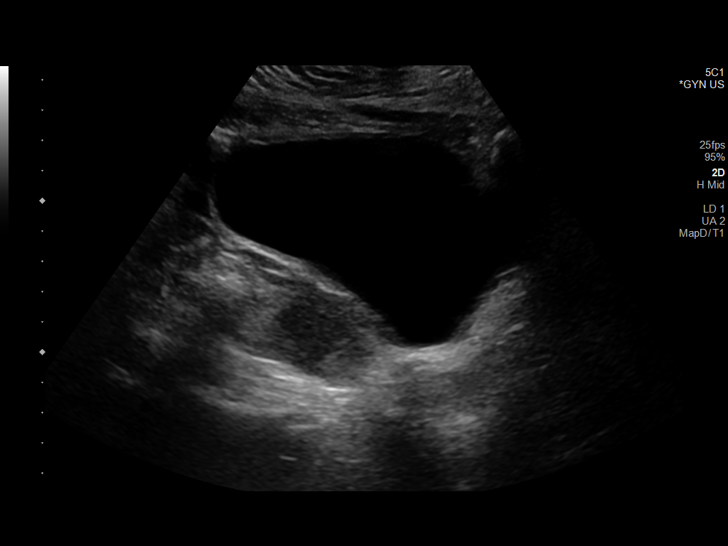
[im 5/58]
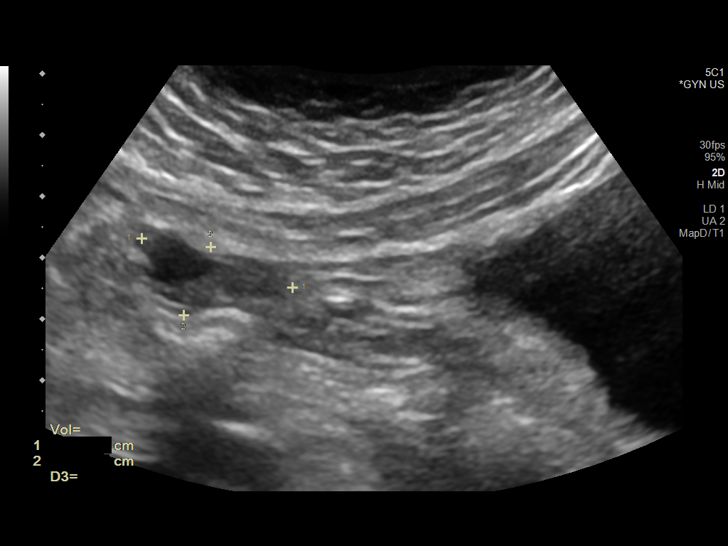
[im 10/58]
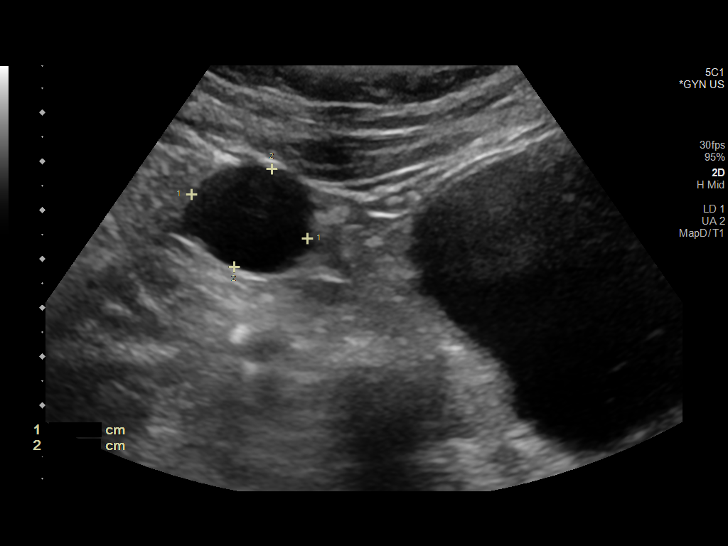
[im 15/58]
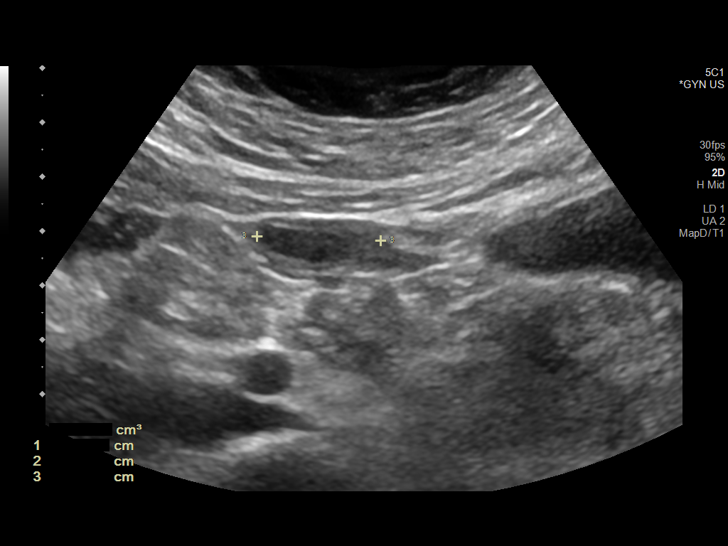
[im 20/58]
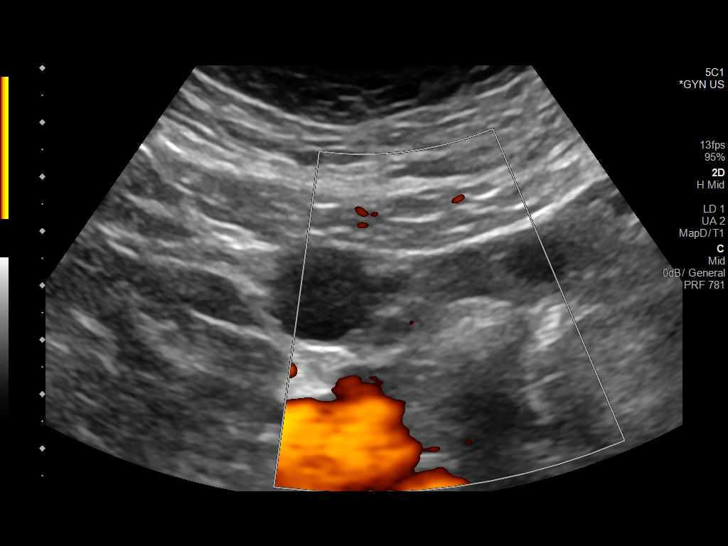
[im 24/58]
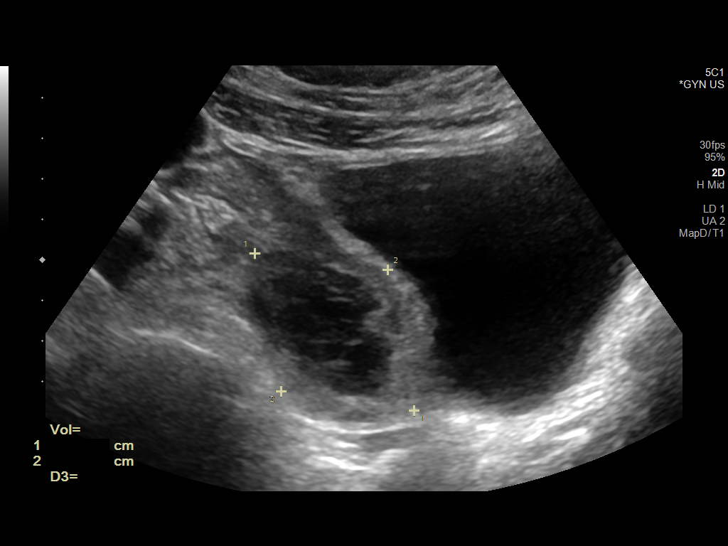
[im 29/58]
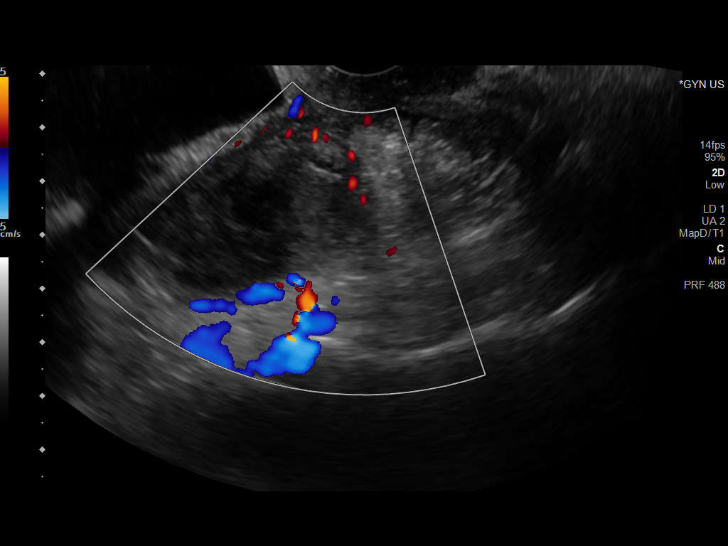
[im 34/58]
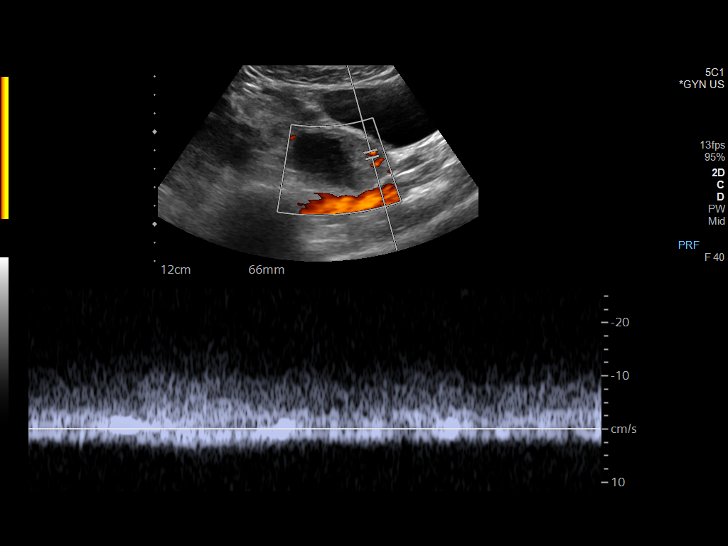
[im 39/58]
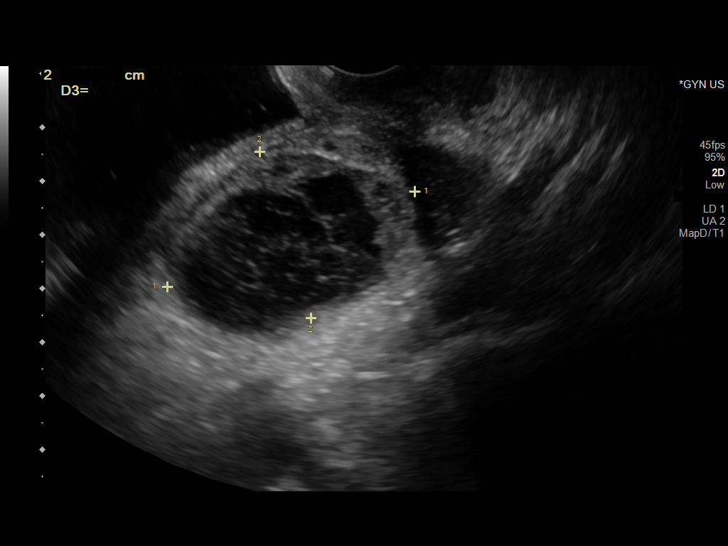
[im 43/58]
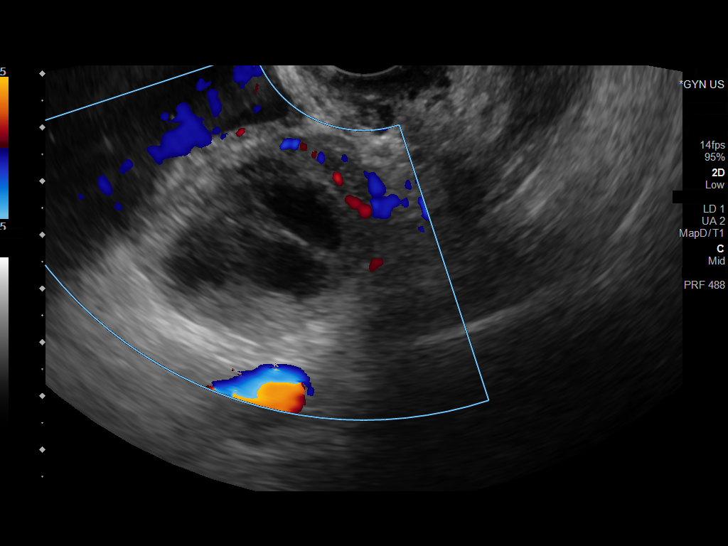
[im 48/58]
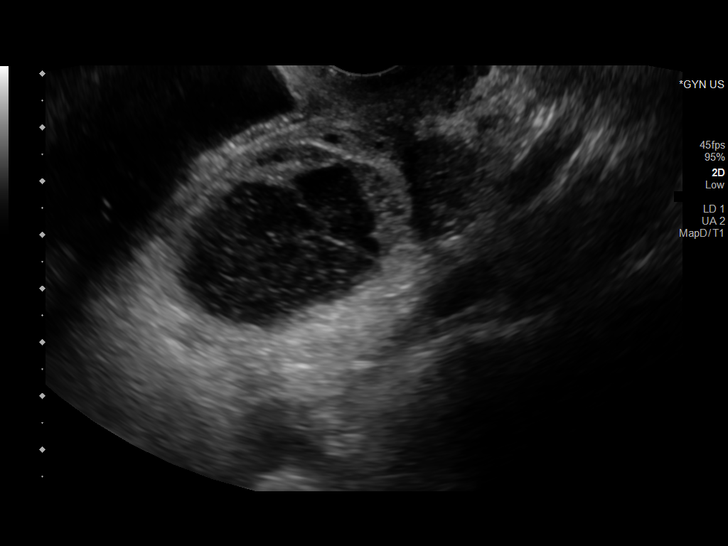
[im 53/58]
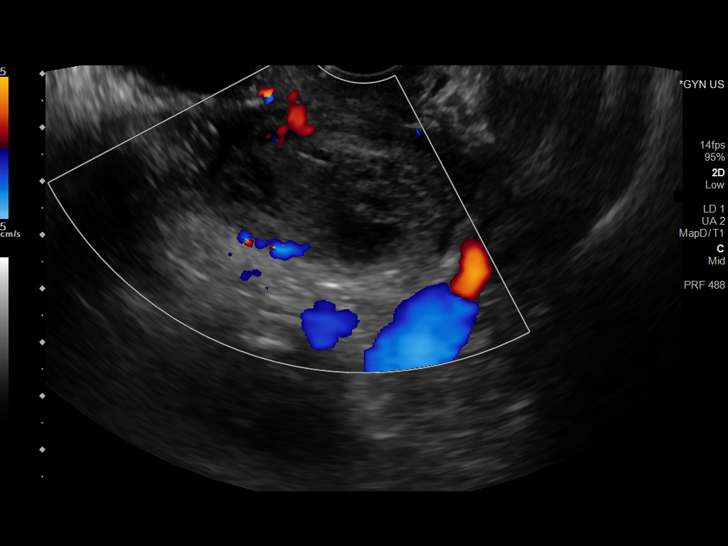
[im 58/58]
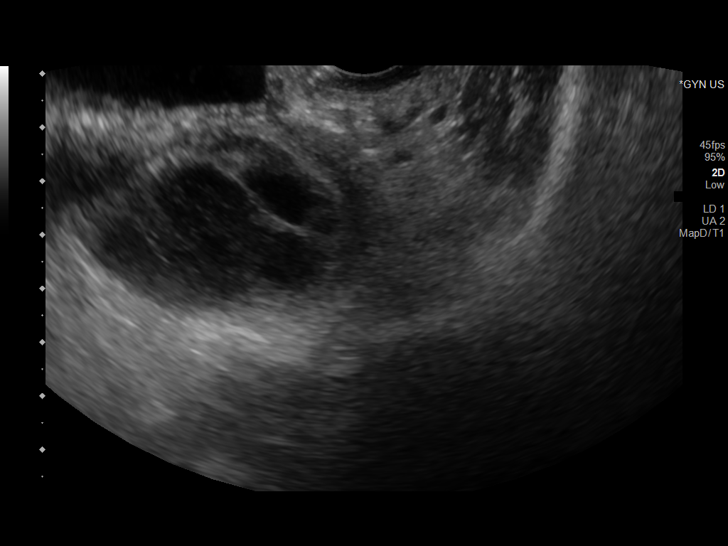

[13 of 25 positions shown; findings below may reference images not displayed]

FINDINGS: Uterus

Surgically absent

Endometrium

N/A

Right ovary

Measurements: 4.8 x 2.0 x 2.4 cm = volume: 13.4 mL. Grossly normal
morphology without mass. Suboptimally visualized on transabdominal
imaging, not visualized on transvaginal imaging.

Left ovary

Measurements: 5.5 x 4.0 x 6.0 cm = volume: 69.1 mL. Complex
hypoechoic lesion within LEFT ovary 4.6 x 3.4 x 3.8 cm containing
Chamess internal architecture and scattered low level internal
echogenicity most consistent with a hemorrhagic cyst; no follow-up
imaging recommended. Blood flow present within LEFT ovary on color
Doppler imaging.

Pulsed Doppler evaluation of the LEFT ovary demonstrates normal
low-resistance arterial and venous waveforms. RIGHT ovary was
inadequately visualized for Doppler assessment.

Other findings

Trace free pelvic fluid. Additional simple cyst within RIGHT adnexa
2.7 x 2.0 x 2.6 cm consistent with simple paraovarian cyst; no
follow-up imaging recommended.
IMPRESSION: Surgical absence of uterus.

Unremarkable RIGHT ovary.

Complex cyst of the LEFT ovary 4.6 cm greatest size most consistent
with a hemorrhagic cyst; no follow-up imaging recommended.

Simple paraovarian cyst RIGHT adnexa 2.7 cm greatest size; no
follow-up imaging recommended.

No evidence of LEFT ovarian torsion, RIGHT ovary suboptimally
visualized.

## 2022-09-14 ENCOUNTER — Ambulatory Visit (INDEPENDENT_AMBULATORY_CARE_PROVIDER_SITE_OTHER): Payer: Commercial Managed Care - HMO | Admitting: Adult Health

## 2022-09-16 ENCOUNTER — Other Ambulatory Visit (INDEPENDENT_AMBULATORY_CARE_PROVIDER_SITE_OTHER): Payer: Self-pay | Admitting: Adult Health

## 2022-09-16 DIAGNOSIS — E559 Vitamin D deficiency, unspecified: Secondary | ICD-10-CM

## 2022-09-17 ENCOUNTER — Ambulatory Visit (INDEPENDENT_AMBULATORY_CARE_PROVIDER_SITE_OTHER): Payer: Commercial Managed Care - HMO | Admitting: Adult Health

## 2022-09-17 ENCOUNTER — Encounter (INDEPENDENT_AMBULATORY_CARE_PROVIDER_SITE_OTHER): Payer: Self-pay | Admitting: Adult Health

## 2022-09-17 VITALS — BP 121/79 | HR 71 | Temp 98.2°F | Ht 63.0 in | Wt 221.0 lb

## 2022-09-17 DIAGNOSIS — E7849 Other hyperlipidemia: Secondary | ICD-10-CM | POA: Diagnosis not present

## 2022-09-17 DIAGNOSIS — Z6839 Body mass index (BMI) 39.0-39.9, adult: Secondary | ICD-10-CM

## 2022-09-17 DIAGNOSIS — E559 Vitamin D deficiency, unspecified: Secondary | ICD-10-CM | POA: Diagnosis not present

## 2022-09-17 DIAGNOSIS — E538 Deficiency of other specified B group vitamins: Secondary | ICD-10-CM

## 2022-09-17 DIAGNOSIS — E669 Obesity, unspecified: Secondary | ICD-10-CM

## 2022-09-17 DIAGNOSIS — E88819 Insulin resistance, unspecified: Secondary | ICD-10-CM | POA: Diagnosis not present

## 2022-09-17 MED ORDER — VITAMIN D (ERGOCALCIFEROL) 1.25 MG (50000 UNIT) PO CAPS: 50000.0000 [IU] | ORAL_CAPSULE | ORAL | 0 refills | Status: DC

## 2022-09-18 LAB — COMPREHENSIVE METABOLIC PANEL
ALT: 19 IU/L (ref 0–32)
AST: 22 IU/L (ref 0–40)
Albumin/Globulin Ratio: 1.4 (ref 1.2–2.2)
Albumin: 4.1 g/dL (ref 3.9–4.9)
Alkaline Phosphatase: 107 IU/L (ref 44–121)
BUN/Creatinine Ratio: 14 (ref 9–23)
BUN: 11 mg/dL (ref 6–20)
Bilirubin Total: 0.2 mg/dL (ref 0.0–1.2)
CO2: 26 mmol/L (ref 20–29)
Calcium: 9 mg/dL (ref 8.7–10.2)
Chloride: 103 mmol/L (ref 96–106)
Creatinine, Ser: 0.77 mg/dL (ref 0.57–1.00)
Globulin, Total: 2.9 g/dL (ref 1.5–4.5)
Glucose: 82 mg/dL (ref 70–99)
Potassium: 4.8 mmol/L (ref 3.5–5.2)
Sodium: 142 mmol/L (ref 134–144)
Total Protein: 7 g/dL (ref 6.0–8.5)
eGFR: 101 mL/min/{1.73_m2} (ref >59)

## 2022-09-18 LAB — HEMOGLOBIN A1C
Est. average glucose Bld gHb Est-mCnc: 117 mg/dL
Hgb A1c MFr Bld: 5.7 % — ABNORMAL HIGH (ref 4.8–5.6)

## 2022-09-18 LAB — INSULIN, RANDOM: INSULIN: 8.3 u[IU]/mL (ref 2.6–24.9)

## 2022-09-18 LAB — LIPID PANEL
Chol/HDL Ratio: 2.4 ratio (ref 0.0–4.4)
Cholesterol, Total: 164 mg/dL (ref 100–199)
HDL: 67 mg/dL (ref >39)
LDL Chol Calc (NIH): 86 mg/dL (ref 0–99)
Triglycerides: 51 mg/dL (ref 0–149)
VLDL Cholesterol Cal: 11 mg/dL (ref 5–40)

## 2022-09-18 LAB — VITAMIN D 25 HYDROXY (VIT D DEFICIENCY, FRACTURES): Vit D, 25-Hydroxy: 62.6 ng/mL (ref 30.0–100.0)

## 2022-09-18 LAB — VITAMIN B12: Vitamin B-12: 1489 pg/mL — ABNORMAL HIGH (ref 232–1245)

## 2022-09-24 NOTE — Progress Notes (Signed)
Chief Complaint:   OBESITY Lauren Garza is here to discuss her progress with her obesity treatment plan along with follow-up of her obesity related diagnoses. Lauren Garza is on the Category 2 Plan and states she is following her eating plan approximately 50% of the time. Lauren Garza states she is riding her bike 16-18 minutes 2 times per week.  Today's visit was #: 54 Starting weight: 237 lbs Starting date: 03/19/2020 Today's weight: 215 lbs Today's date: 09/16/2022 Total lbs lost to date: 22 lbs Total lbs lost since last in-office visit: +6 lbs  Interim History:  She stopped oral GLP-1 therapy Rybelsus 3 mg >4 weeks.  She stopped due SE with GLP-1, CH:ENIDPOEUM fatigue.  She was only able to tolerate 6 weeks.   Subjective:   1. Vitamin D deficiency She is on weekly ergocalciferol, tolerating.  She denies N/V/Muscle Weakness.  2. Insulin resistance Unable to tolerate Rybelsus 3 mg daily, she has been off for 6 weeks.   3. Vitamin B12 deficiency PCP manages B12 deficiency She is on once monthly B12 injection.   4. Other hyperlipidemia She is not on statin therapy.  She denies acute cardiac sx's.  Assessment/Plan:   1. Vitamin D deficiency Refill - Vitamin D, Ergocalciferol, (DRISDOL) 1.25 MG (50000 UNIT) CAPS capsule; Take 1 capsule (50,000 Units total) by mouth every 7 (seven) days.  Dispense: 4 capsule; Refill: 0  Check labs today.   - VITAMIN D 25 Hydroxy (Vit-D Deficiency, Fractures)  2. Insulin resistance Check labs today.   - Hemoglobin A1c - Insulin, random  3. Vitamin B12 deficiency Check labs today.   - Vitamin B12  4. Other hyperlipidemia Check labs today.   - Comprehensive metabolic panel - Lipid panel  5. Obesity, Current BMI 39.1 Lauren Garza is currently in the action stage of change. As such, her goal is to continue with weight loss efforts. She has agreed to the Category 2 Plan.   Exercise goals:  As is.   Behavioral modification  strategies: increasing lean protein intake, decreasing simple carbohydrates, meal planning and cooking strategies, keeping healthy foods in the home, and planning for success.  Lauren Garza has agreed to follow-up with our clinic in 4 weeks. She was informed of the importance of frequent follow-up visits to maximize her success with intensive lifestyle modifications for her multiple health conditions.   Lauren Garza was informed we would discuss her lab results at her next visit unless there is a critical issue that needs to be addressed sooner. Lauren Garza agreed to keep her next visit at the agreed upon time to discuss these results.  Objective:   Blood pressure 121/79, pulse 71, temperature 98.2 F (36.8 C), height '5\' 3"'$  (1.6 m), weight 221 lb (100.2 kg), last menstrual period 02/11/2018, SpO2 97 %. Body mass index is 39.15 kg/m.  General: Cooperative, alert, well developed, in no acute distress. HEENT: Conjunctivae and lids unremarkable. Cardiovascular: Regular rhythm.  Lungs: Normal work of breathing. Neurologic: No focal deficits.   Lab Results  Component Value Date   CREATININE 0.77 09/17/2022   BUN 11 09/17/2022   NA 142 09/17/2022   K 4.8 09/17/2022   CL 103 09/17/2022   CO2 26 09/17/2022   Lab Results  Component Value Date   ALT 19 09/17/2022   AST 22 09/17/2022   ALKPHOS 107 09/17/2022   BILITOT 0.2 09/17/2022   Lab Results  Component Value Date   HGBA1C 5.7 (H) 09/17/2022   HGBA1C 5.4 03/18/2022   HGBA1C 5.4 01/14/2022  HGBA1C 5.5 07/31/2021   HGBA1C 5.3 01/27/2021   Lab Results  Component Value Date   INSULIN 8.3 09/17/2022   INSULIN 10.3 03/18/2022   INSULIN 4.9 01/14/2022   INSULIN 7.0 07/31/2021   INSULIN 8.1 01/27/2021   Lab Results  Component Value Date   TSH 1.360 03/19/2020   Lab Results  Component Value Date   CHOL 164 09/17/2022   HDL 67 09/17/2022   LDLCALC 86 09/17/2022   TRIG 51 09/17/2022   CHOLHDL 2.4 09/17/2022   Lab Results   Component Value Date   VD25OH 62.6 09/17/2022   VD25OH 48.7 03/18/2022   VD25OH 69.7 01/14/2022   Lab Results  Component Value Date   WBC 9.1 11/07/2020   HGB 13.2 11/07/2020   HCT 41.9 11/07/2020   MCV 84.5 11/07/2020   PLT 311 11/07/2020   No results found for: "IRON", "TIBC", "FERRITIN"  Attestation Statements:   Reviewed by clinician on day of visit: allergies, medications, problem list, medical history, surgical history, family history, social history, and previous encounter notes.  I, Davy Pique, RMA, am acting as Location manager for Mina Marble, NP.  I have reviewed the above documentation for accuracy and completeness, and I agree with the above. -  Shiheem Corporan d. Rainer Mounce, NP-C

## 2022-09-30 ENCOUNTER — Ambulatory Visit (INDEPENDENT_AMBULATORY_CARE_PROVIDER_SITE_OTHER): Payer: Commercial Managed Care - HMO | Admitting: Adult Health

## 2022-09-30 ENCOUNTER — Encounter (INDEPENDENT_AMBULATORY_CARE_PROVIDER_SITE_OTHER): Payer: Self-pay | Admitting: Adult Health

## 2022-09-30 VITALS — BP 121/80 | HR 76 | Temp 98.2°F | Ht 63.0 in | Wt 215.0 lb

## 2022-09-30 DIAGNOSIS — Z6838 Body mass index (BMI) 38.0-38.9, adult: Secondary | ICD-10-CM

## 2022-09-30 DIAGNOSIS — E538 Deficiency of other specified B group vitamins: Secondary | ICD-10-CM

## 2022-09-30 DIAGNOSIS — E7849 Other hyperlipidemia: Secondary | ICD-10-CM

## 2022-09-30 DIAGNOSIS — K921 Melena: Secondary | ICD-10-CM | POA: Diagnosis not present

## 2022-09-30 DIAGNOSIS — R7303 Prediabetes: Secondary | ICD-10-CM

## 2022-09-30 DIAGNOSIS — R7401 Elevation of levels of liver transaminase levels: Secondary | ICD-10-CM

## 2022-09-30 DIAGNOSIS — E559 Vitamin D deficiency, unspecified: Secondary | ICD-10-CM

## 2022-09-30 DIAGNOSIS — E669 Obesity, unspecified: Secondary | ICD-10-CM

## 2022-09-30 DIAGNOSIS — Z6841 Body Mass Index (BMI) 40.0 and over, adult: Secondary | ICD-10-CM

## 2022-09-30 MED ORDER — VITAMIN D (ERGOCALCIFEROL) 1.25 MG (50000 UNIT) PO CAPS: 50000.0000 [IU] | ORAL_CAPSULE | ORAL | 0 refills | Status: DC

## 2022-10-05 ENCOUNTER — Other Ambulatory Visit (INDEPENDENT_AMBULATORY_CARE_PROVIDER_SITE_OTHER): Payer: Self-pay | Admitting: Family Medicine

## 2022-10-05 DIAGNOSIS — J3089 Other allergic rhinitis: Secondary | ICD-10-CM

## 2022-10-05 DIAGNOSIS — K921 Melena: Secondary | ICD-10-CM | POA: Insufficient documentation

## 2022-10-05 NOTE — Progress Notes (Signed)
Chief Complaint:   OBESITY Lauren Garza is here to discuss her progress with her obesity treatment plan along with follow-up of her obesity related diagnoses. Lauren Garza is on the Category 2 Plan and states she is following her eating plan approximately 50% of the time. Lauren Garza states she is not exercising.   Today's visit was #: 40 Starting weight: 237 lbs Starting date: 04/202/2021 Today's weight: 215 lbs Today's date:  09/30/2022 Total lbs lost to date: 22 lbs Total lbs lost since last in-office visit: 6 lbs  Interim History:  Lauren Garza states "I've been trying to eat on plan and increase my water intake".   09/28/2022, PCP, Dr Ayesha Rumpf gave her a sample of Ozempic 0.25 mg-6 doses worth. She took the first dose yesterday. She denies mass in neck, dysphagia, dyspepsia, persistent hoarseness, abdominal pain, or N/V/Constipation.  Of note- she recently experienced GI upset.   She was evaluated by PCP. She denies any current GI upset at present.  Subjective:   1. Hematochezia 09/27/2022- she provided the recall of daily intake: Breakfast- yogurt with protein granola with 45 calorie toast.   Lunch -prescribed sandwich,She then late    Lauren Garza- consumed dinner, ground chicken patty.  (7.5 oz).  Green beans.   Sunday night she developed nausea and bloody diarrhea. She was seen by her PCP Dr Ayesha Rumpf 09/28/2022.  She was evaluated, but no referral to GI.  She denies family hx of colon cancer. She denies GI upset at present.  2. Other hyperlipidemia Lipid Panel - stable!    Component Value Date/Time   CHOL 164 09/17/2022 1027   TRIG 51 09/17/2022 1027   HDL 67 09/17/2022 1027   CHOLHDL 2.4 09/17/2022 1027   LDLCALC 86 09/17/2022 1027   LABVLDL 11 09/17/2022 1027     3. Transaminitis Discussed labs with patient today. 09/17/2022, CMP, LFT's were normal.  She denies RUQ pain.  4. Vitamin D deficiency Discussed labs with patient today. 09/17/2022, Vitamin D level 62.6,  stable.  She is on weekly ergocalciferol.  She denies N/V/Constipation.  5. Vitamin B12 deficiency Discussed labs with patient today. 09/17/2022, B12 level 1489.   PCP manages monthly B12 injections.    6. Prediabetes Discussed labs with patient today. Lab Results  Component Value Date   HGBA1C 5.7 (H) 09/17/2022   HGBA1C 5.4 03/18/2022   HGBA1C 5.4 01/14/2022    PCP provided sample of Ozempic 0.25 mg 6 doses worth. She took the first dose yesterday. She denies mass in neck, dysphagia, dyspepsia, persistent hoarseness, abdominal pain, or N/V/Constipation.   Assessment/Plan:   1. Hematochezia Monitor symptoms, remain well hydrated.   2. Other hyperlipidemia Continue Category 2 meal plan.  3. Transaminitis Continue to work on weight loss efforts.   4. Vitamin D deficiency Refill - Vitamin D, Ergocalciferol, (DRISDOL) 1.25 MG (50000 UNIT) CAPS capsule; Take 1 capsule (50,000 Units total) by mouth every 7 (seven) days.  Dispense: 4 capsule; Refill: 0  5. Vitamin B12 deficiency Hold next dose, follow up with PCP.  6. Prediabetes Continue Category 2 meal plan and continue Ozempic.   7. Obesity, Current BMI 38.2 Handouts:  Thanksgiving Strategies  Lauren Garza is currently in the action stage of change. As such, her goal is to continue with weight loss efforts. She has agreed to the Category 2 Plan.   Exercise goals:  Increase daily walking  Behavioral modification strategies: increasing lean protein intake, decreasing simple carbohydrates, meal planning and cooking strategies, keeping healthy foods in the home, and  planning for success.  Lauren Garza has agreed to follow-up with our clinic in 4 weeks. She was informed of the importance of frequent follow-up visits to maximize her success with intensive lifestyle modifications for her multiple health conditions.   Objective:   Blood pressure 121/80, pulse 76, temperature 98.2 F (36.8 C), height '5\' 3"'$  (1.6 m), weight 215 lb  (97.5 kg), last menstrual period 02/11/2018, SpO2 99 %. Body mass index is 38.09 kg/m.  General: Cooperative, alert, well developed, in no acute distress. HEENT: Conjunctivae and lids unremarkable. Cardiovascular: Regular rhythm.  Lungs: Normal work of breathing. Neurologic: No focal deficits.   Lab Results  Component Value Date   CREATININE 0.77 09/17/2022   BUN 11 09/17/2022   NA 142 09/17/2022   K 4.8 09/17/2022   CL 103 09/17/2022   CO2 26 09/17/2022   Lab Results  Component Value Date   ALT 19 09/17/2022   AST 22 09/17/2022   ALKPHOS 107 09/17/2022   BILITOT 0.2 09/17/2022   Lab Results  Component Value Date   HGBA1C 5.7 (H) 09/17/2022   HGBA1C 5.4 03/18/2022   HGBA1C 5.4 01/14/2022   HGBA1C 5.5 07/31/2021   HGBA1C 5.3 01/27/2021   Lab Results  Component Value Date   INSULIN 8.3 09/17/2022   INSULIN 10.3 03/18/2022   INSULIN 4.9 01/14/2022   INSULIN 7.0 07/31/2021   INSULIN 8.1 01/27/2021   Lab Results  Component Value Date   TSH 1.360 03/19/2020   Lab Results  Component Value Date   CHOL 164 09/17/2022   HDL 67 09/17/2022   LDLCALC 86 09/17/2022   TRIG 51 09/17/2022   CHOLHDL 2.4 09/17/2022   Lab Results  Component Value Date   VD25OH 62.6 09/17/2022   VD25OH 48.7 03/18/2022   VD25OH 69.7 01/14/2022   Lab Results  Component Value Date   WBC 9.1 11/07/2020   HGB 13.2 11/07/2020   HCT 41.9 11/07/2020   MCV 84.5 11/07/2020   PLT 311 11/07/2020   No results found for: "IRON", "TIBC", "FERRITIN"  Attestation Statements:   Reviewed by clinician on day of visit: allergies, medications, problem list, medical history, surgical history, family history, social history, and previous encounter notes.  I, Davy Pique, RMA, am acting as Location manager for Mina Marble, NP.  I have reviewed the above documentation for accuracy and completeness, and I agree with the above. -  Bana Borgmeyer d. Beautiful Pensyl, NP-C

## 2022-10-14 ENCOUNTER — Telehealth: Payer: Self-pay | Admitting: Physical Medicine and Rehabilitation

## 2022-10-14 NOTE — Telephone Encounter (Signed)
Pt called to schedule bilateral hip injection. Please call pt at [phone]

## 2022-10-15 ENCOUNTER — Ambulatory Visit (INDEPENDENT_AMBULATORY_CARE_PROVIDER_SITE_OTHER): Payer: Commercial Managed Care - HMO | Admitting: Physical Medicine and Rehabilitation

## 2022-10-15 ENCOUNTER — Ambulatory Visit: Payer: Self-pay

## 2022-10-15 DIAGNOSIS — M7061 Trochanteric bursitis, right hip: Secondary | ICD-10-CM

## 2022-10-15 DIAGNOSIS — M7062 Trochanteric bursitis, left hip: Secondary | ICD-10-CM | POA: Diagnosis not present

## 2022-10-15 NOTE — Progress Notes (Signed)
Patient present for bilateral greater trochanter injections

## 2022-10-15 NOTE — Progress Notes (Signed)
   Lauren Garza - 41 y.o. female MRN [id number]  Date of birth: May 23, 1981  Office Visit Note: Visit Date: 10/15/2022 PCP: Lin Landsman, MD Referred by: Lin Landsman, MD  Subjective: Chief Complaint  Patient presents with   Other    Bilat hip pain   HPI:  Lauren Garza is a 41 y.o. female who comes in today for planned repeat Bilateral greater trochanteric injections with fluoroscopic guidance.  The patient has failed conservative care including home exercise, medications, time and activity modification. Prior injection gave more than 50% relief for several months. This injection will be diagnostic and hopefully therapeutic.  Please see requesting physician notes for further details and justification.    ROS Otherwise per HPI.  Assessment & Plan: Visit Diagnoses: No diagnosis found.  Plan: No additional findings.   Meds & Orders: No orders of the defined types were placed in this encounter.  No orders of the defined types were placed in this encounter.   Follow-up: No follow-ups on file.   Procedures: Large Joint Inj: bilateral greater trochanter on 10/15/2022 8:35 AM Indications: pain and diagnostic evaluation Details: 22 G 3.5 in needle, fluoroscopy-guided lateral approach  Arthrogram: No  Medications (Right): 40 mg triamcinolone acetonide 40 MG/ML; 5 mL bupivacaine 0.5 % Medications (Left): 40 mg triamcinolone acetonide 40 MG/ML; 5 mL bupivacaine 0.5 % Outcome: tolerated well, no immediate complications  There was excellent flow of contrast outlined the greater trochanteric bursa without vascular uptake. Procedure, treatment alternatives, risks and benefits explained, specific risks discussed. Consent was given by the patient. Immediately prior to procedure a time out was called to verify the correct patient, procedure, equipment, support staff and site/side marked as required. Patient was prepped and draped in the usual sterile fashion.          Clinical  History: EXAM: MRI LUMBAR SPINE WITHOUT CONTRAST   TECHNIQUE: Multiplanar, multisequence MR imaging of the lumbar spine was performed. No intravenous contrast was administered.   COMPARISON:  None available.   FINDINGS: Segmentation: Standard. Lowest well-formed disc space labeled the L5-S1 level.   Alignment: Mild dextroscoliosis. Alignment otherwise normal preservation of the normal lumbar lordosis.   Vertebrae: Vertebral body height maintained without evidence for acute or chronic fracture. Bone marrow signal intensity within normal limits. No discrete or worrisome osseous lesions. No abnormal marrow edema.   Conus medullaris and cauda equina: Conus extends to the L1 level. Conus and cauda equina appear normal.   Paraspinal and other soft tissues: Paraspinous soft tissues within normal limits. Visualized visceral structures are normal.   Disc levels:   L1-2:  Unremarkable.   L2-3:  Unremarkable.   L3-4: Disc desiccation with minimal annular disc bulge. No stenosis or impingement.   L4-5: Normal interspace. Mild facet hypertrophy. No stenosis or impingement.   L5-S1: Normal interspace. Mild facet hypertrophy. No stenosis or impingement.   IMPRESSION: 1. Mild degenerative disc disease at L3-4 without stenosis or impingement. 2. Mild facet hypertrophy at L4-5 and L5-S1. 3. Underlying mild dextroscoliosis.     Electronically Signed   By: Jeannine Boga M.D.   On: 02/24/2020 22:54     Objective:  VS:  HT:    WT:   BMI:     BP:   HR: bpm  TEMP: ( )  RESP:  Physical Exam   Imaging: No results found.

## 2022-10-21 MED ORDER — BUPIVACAINE HCL 0.5 % IJ SOLN
5.0000 mL | INTRAMUSCULAR | Status: AC | PRN
  Administered 2022-10-15: 5 mL via INTRA_ARTICULAR

## 2022-10-21 MED ORDER — TRIAMCINOLONE ACETONIDE 40 MG/ML IJ SUSP
40.0000 mg | INTRAMUSCULAR | Status: AC | PRN
  Administered 2022-10-15: 40 mg via INTRA_ARTICULAR

## 2022-10-29 ENCOUNTER — Ambulatory Visit (INDEPENDENT_AMBULATORY_CARE_PROVIDER_SITE_OTHER): Payer: Commercial Managed Care - HMO | Admitting: Family Medicine

## 2022-10-29 ENCOUNTER — Ambulatory Visit (INDEPENDENT_AMBULATORY_CARE_PROVIDER_SITE_OTHER): Payer: Medicaid Other | Admitting: Physician Assistant

## 2022-10-29 ENCOUNTER — Encounter (INDEPENDENT_AMBULATORY_CARE_PROVIDER_SITE_OTHER): Payer: Self-pay | Admitting: Physician Assistant

## 2022-10-29 VITALS — BP 124/85 | HR 67 | Temp 98.2°F | Ht 63.0 in | Wt 213.0 lb

## 2022-10-29 DIAGNOSIS — E669 Obesity, unspecified: Secondary | ICD-10-CM | POA: Diagnosis not present

## 2022-10-29 DIAGNOSIS — E559 Vitamin D deficiency, unspecified: Secondary | ICD-10-CM | POA: Diagnosis not present

## 2022-10-29 DIAGNOSIS — R7303 Prediabetes: Secondary | ICD-10-CM

## 2022-10-29 DIAGNOSIS — Z6837 Body mass index (BMI) 37.0-37.9, adult: Secondary | ICD-10-CM

## 2022-10-29 DIAGNOSIS — J3089 Other allergic rhinitis: Secondary | ICD-10-CM | POA: Diagnosis not present

## 2022-10-29 MED ORDER — VITAMIN D (ERGOCALCIFEROL) 1.25 MG (50000 UNIT) PO CAPS: 50000.0000 [IU] | ORAL_CAPSULE | ORAL | 0 refills | Status: DC

## 2022-10-29 MED ORDER — LEVOCETIRIZINE DIHYDROCHLORIDE 5 MG PO TABS: 5.0000 mg | ORAL_TABLET | Freq: Every evening | ORAL | 0 refills | Status: DC

## 2022-11-06 ENCOUNTER — Emergency Department (HOSPITAL_BASED_OUTPATIENT_CLINIC_OR_DEPARTMENT_OTHER): Payer: Commercial Managed Care - HMO

## 2022-11-06 ENCOUNTER — Other Ambulatory Visit: Payer: Self-pay

## 2022-11-06 ENCOUNTER — Emergency Department (HOSPITAL_BASED_OUTPATIENT_CLINIC_OR_DEPARTMENT_OTHER)
Admission: EM | Admit: 2022-11-06 | Discharge: 2022-11-06 | Disposition: A | Discharge status: Home / Self Care | Payer: Commercial Managed Care - HMO | Attending: Emergency Medicine | Admitting: Emergency Medicine

## 2022-11-06 ENCOUNTER — Encounter (HOSPITAL_BASED_OUTPATIENT_CLINIC_OR_DEPARTMENT_OTHER): Payer: Self-pay | Admitting: Emergency Medicine

## 2022-11-06 DIAGNOSIS — R519 Headache, unspecified: Secondary | ICD-10-CM | POA: Diagnosis present

## 2022-11-06 DIAGNOSIS — Z1152 Encounter for screening for COVID-19: Secondary | ICD-10-CM | POA: Insufficient documentation

## 2022-11-06 LAB — RESP PANEL BY RT-PCR (FLU A&B, COVID) ARPGX2
Influenza A by PCR: NEGATIVE
Influenza B by PCR: NEGATIVE
SARS Coronavirus 2 by RT PCR: NEGATIVE

## 2022-11-06 MED ORDER — TRAMADOL HCL 50 MG PO TABS
50.0000 mg | ORAL_TABLET | Freq: Once | ORAL | Status: AC
  Administered 2022-11-06: 50 mg via ORAL
  Filled 2022-11-06: qty 1

## 2022-11-06 MED ORDER — SUMATRIPTAN SUCCINATE 6 MG/0.5ML ~~LOC~~ SOLN
6.0000 mg | Freq: Once | SUBCUTANEOUS | Status: AC
  Administered 2022-11-06: 6 mg via SUBCUTANEOUS
  Filled 2022-11-06: qty 0.5

## 2022-11-06 MED ORDER — METOCLOPRAMIDE HCL 5 MG/ML IJ SOLN
10.0000 mg | Freq: Once | INTRAMUSCULAR | Status: AC
  Administered 2022-11-06: 10 mg via INTRAMUSCULAR
  Filled 2022-11-06: qty 2

## 2022-11-06 NOTE — Discharge Instructions (Addendum)
It was our pleasure to provide your ER care today - we hope that you feel better.  Drink plenty of fluids/stay well hydrated.   You may try ibuprofen, naprosyn, or excedrin as need.   Follow up with primary care doctor in 1-2 weeks if symptoms fail to improve/resolve.  Return to ER if worse, new symptoms, high fevers, severe pain, persistent vomiting, or other concern.   You were given pain meds in the ER - no driving for the next 6 hours.

## 2022-11-06 NOTE — ED Provider Notes (Signed)
[facility] DEPARTMENT Provider Note   CSN: [id number] Arrival date & time: 11/06/22  0941     History  Chief Complaint  Patient presents with   Migraine    Lauren Garza is a 41 y.o. female.  Pt with c/o headache in past two days. Symptoms acute onset, moderate, persistent, constant, superior/diffuse location in head. Denies hx same headaches. Otc med did not help. +nausea. No vomiting. No sinus pain/pressure, sore throat or uri symptoms. No fever or chills. No syncope. No recent head trauma. No eye pain or change in vision. No neck pain or stiffness. No numbness/weakness. No specific exacerbating or alleviating factors. Indicates is very unusual for her to have such a prolonged headache.   The history is provided by the patient and medical records.  Migraine Associated symptoms include headaches. Pertinent negatives include no chest pain, no abdominal pain and no shortness of breath.       Home Medications Prior to Admission medications   Medication Sig Start Date End Date Taking? Authorizing Provider  albuterol (ACCUNEB) 0.63 MG/3ML nebulizer solution Take by nebulization.    [provider]  Boric Acid Vaginal 600 MG SUPP INSERT 1 SUPPOSITORY VAGINALLY AT BEDTIME. FOR VAGINAL USE 01/24/21   [provider]  Cholecalciferol (VITAMIN D HIGH POTENCY PO) Take 5,000 Units by mouth every other day.    [provider]  cyanocobalamin (,VITAMIN B-12,) 1000 MCG/ML injection Inject 1,000 mcg into the skin every 30 (thirty) days. 01/28/21   [provider]  levocetirizine (XYZAL) 5 MG tablet Take 1 tablet (5 mg total) by mouth every evening. 10/29/22   Rayburn, Neta Mends, PA-C  montelukast (SINGULAIR) 10 MG tablet SMARTSIG:1 Tablet(s) By Mouth Every Evening 01/01/21   [provider]  omeprazole (PRILOSEC) 20 MG capsule 1 po qd or BID 07/09/22   Mina Marble D, NP  SYMBICORT 80-4.5 MCG/ACT inhaler SMARTSIG:2 Puff(s)  By Mouth Twice Daily 01/14/21   [provider]  valACYclovir (VALTREX) 1000 MG tablet Take 1,000 mg by mouth 3 (three) times daily. 03/24/21   [provider]  Vitamin D, Ergocalciferol, (DRISDOL) 1.25 MG (50000 UNIT) CAPS capsule Take 1 capsule (50,000 Units total) by mouth every 7 (seven) days. 10/29/22   Rayburn, Neta Mends, PA-C      Allergies    Ceftin [cefuroxime], Clindamycin/lincomycin, Dust mite extract, Flagyl [metronidazole hcl], Lincomycin hcl, and Metformin and related    Review of Systems   Review of Systems  Constitutional:  Negative for chills and fever.  HENT:  Negative for ear pain, sinus pain and sore throat.   Eyes:  Negative for redness.  Respiratory:  Negative for shortness of breath.   Cardiovascular:  Negative for chest pain.  Gastrointestinal:  Positive for nausea. Negative for abdominal pain and vomiting.  Genitourinary:  Negative for dysuria and flank pain.  Musculoskeletal:  Negative for neck pain and neck stiffness.  Skin:  Negative for rash.  Neurological:  Positive for headaches. Negative for speech difficulty, weakness and numbness.  Hematological:  Does not bruise/bleed easily.  Psychiatric/Behavioral:  Negative for confusion.     Physical Exam Updated Vital Signs BP 103/80   Pulse 91   Temp 98.4 F (36.9 C) (Oral)   Resp 18   Ht 1.6 m ('5\' 3"'$ )   Wt 96.6 kg   LMP 02/11/2018 (Exact Date)   SpO2 100%   BMI 37.73 kg/m  Physical Exam Vitals and nursing note reviewed.  Constitutional:  Appearance: Normal appearance. She is well-developed.  HENT:     Head: Atraumatic.     Comments: No sinus or temporal tenderness.    Nose: Nose normal. No congestion or rhinorrhea.     Mouth/Throat:     Mouth: Mucous membranes are moist.     Pharynx: Oropharynx is clear.  Eyes:     General: No scleral icterus.    Conjunctiva/sclera: Conjunctivae normal.     Pupils: Pupils are equal, round, and reactive to light.  Neck:      Trachea: No tracheal deviation.     Comments: No stiffness or rigidity Cardiovascular:     Rate and Rhythm: Normal rate and regular rhythm.     Pulses: Normal pulses.     Heart sounds: Normal heart sounds. No murmur heard.    No friction rub. No gallop.  Pulmonary:     Effort: Pulmonary effort is normal. No respiratory distress.     Breath sounds: Normal breath sounds.  Abdominal:     General: Bowel sounds are normal. There is no distension.     Palpations: Abdomen is soft.     Tenderness: There is no abdominal tenderness.  Genitourinary:    Comments: No cva tenderness.  Musculoskeletal:        General: No swelling.     Cervical back: Normal range of motion and neck supple. No rigidity. No muscular tenderness.  Skin:    General: Skin is warm and dry.     Findings: No rash.  Neurological:     Mental Status: She is alert.     Comments: Alert, speech normal. Motor/sens grossly intact bil. Steady gait.   Psychiatric:        Mood and Affect: Mood normal.     ED Results / Procedures / Treatments   Labs (all labs ordered are listed, but only abnormal results are displayed) Results for orders placed or performed during the hospital encounter of 11/06/22  Resp Panel by RT-PCR (Flu A&B, Covid) Anterior Nasal Swab   Specimen: Anterior Nasal Swab  Result Value Ref Range   SARS Coronavirus 2 by RT PCR NEGATIVE NEGATIVE   Influenza A by PCR NEGATIVE NEGATIVE   Influenza B by PCR NEGATIVE NEGATIVE   CT Head Wo Contrast  Result Date: 11/06/2022 CLINICAL DATA:  Headache EXAM: CT HEAD WITHOUT CONTRAST TECHNIQUE: Contiguous axial images were obtained from the base of the skull through the vertex without intravenous contrast. RADIATION DOSE REDUCTION: This exam was performed according to the departmental dose-optimization program which includes automated exposure control, adjustment of the mA and/or kV according to patient size and/or use of iterative reconstruction technique. COMPARISON:   Brain CT 10/16/2010 FINDINGS: Brain: No evidence of acute infarction, hemorrhage, hydrocephalus, extra-axial collection or mass lesion/mass effect. Vascular: No hyperdense vessel or unexpected calcification. Skull: Intact. Sinuses/Orbits: Post sinus surgery. Mucosal thickening involving the paranasal sinuses. No air-fluid levels. Mastoid air cells are unremarkable. Other: None IMPRESSION: 1. No acute intracranial abnormalities. Electronically Signed   By: Lovey Newcomer M.D.   On: 11/06/2022 12:59   XR C-ARM NO REPORT  Result Date: 10/15/2022 Please see Notes tab for imaging impression.   EKG None  Radiology CT Head Wo Contrast  Result Date: 11/06/2022 CLINICAL DATA:  Headache EXAM: CT HEAD WITHOUT CONTRAST TECHNIQUE: Contiguous axial images were obtained from the base of the skull through the vertex without intravenous contrast. RADIATION DOSE REDUCTION: This exam was performed according to the departmental dose-optimization program which includes automated exposure control, adjustment  of the mA and/or kV according to patient size and/or use of iterative reconstruction technique. COMPARISON:  Brain CT 10/16/2010 FINDINGS: Brain: No evidence of acute infarction, hemorrhage, hydrocephalus, extra-axial collection or mass lesion/mass effect. Vascular: No hyperdense vessel or unexpected calcification. Skull: Intact. Sinuses/Orbits: Post sinus surgery. Mucosal thickening involving the paranasal sinuses. No air-fluid levels. Mastoid air cells are unremarkable. Other: None IMPRESSION: 1. No acute intracranial abnormalities. Electronically Signed   By: Lovey Newcomer M.D.   On: 11/06/2022 12:59    Procedures Procedures    Medications Ordered in ED Medications  SUMAtriptan (IMITREX) injection 6 mg (6 mg Subcutaneous Given 11/06/22 1229)  metoCLOPramide (REGLAN) injection 10 mg (10 mg Intramuscular Given 11/06/22 1229)  traMADol (ULTRAM) tablet 50 mg (50 mg Oral Given 11/06/22 1229)    ED Course/ Medical  Decision Making/ A&P                           Medical Decision Making Problems Addressed: Generalized headache: acute illness or injury with systemic symptoms  Amount and/or Complexity of Data Reviewed External Data Reviewed: notes. Radiology: ordered and independent interpretation performed. Decision-making details documented in ED Course.  Risk Prescription drug management.   Imaging ordered.   Reviewed nursing notes and prior charts for additional history.   Reglan IM, imitrex IM.    CT  reviewed/interpreted by me - no hem.   Labs reviewed/interpreted by me - covid/flu neg.   Recheck, headache improved.   Pt appears stable for d/c.   Rec pcp f/u.  Return precautions provided.            Final Clinical Impression(s) / ED Diagnoses Final diagnoses:  None    Rx / DC Orders ED Discharge Orders     None         Lajean Saver, MD 11/06/22 1338

## 2022-11-06 NOTE — ED Triage Notes (Signed)
Migraine h/a since yesterday

## 2022-11-10 NOTE — Progress Notes (Unsigned)
Chief Complaint:   OBESITY Lauren Garza is here to discuss her progress with her obesity treatment plan along with follow-up of her obesity related diagnoses. Lauren Garza is on the Category 2 Plan and states she is following her eating plan approximately 60% of the time. Lauren Garza states she is exercising 0 minutes 0 times per week.  Today's visit was #: 61 Starting weight: 237 lbs Starting date: 03/19/2020 Today's weight: 213 lbs Today's date: 10/29/2022 Total lbs lost to date: 24 lbs Total lbs lost since last in-office visit: 2  Interim History: Lauren Garza has done well woth weigh tloss. Rrecently taking GLP-1 (Ozempic 0.25 mg weekly as samples from PCP and only has 1 more). Plans to continue to focus on eating plan, working to meet protein goals.   Subjective:   1. Vitamin D deficiency Lauren Garza is currently taking prescription Vit D 50,000 IU once a week. Denies any side effects. Level on 09/17/22 of 62.6--At goal.  2. Environmental and seasonal allergies Lauren Garza is taking Xyzal and Singular--Denies any side effects.  3. Prediabetes A1c at 5.7, insulin at 8.3. Taking Ozempic 0.25 mg weekly per PC, but plans to focus on eating plan as no insurance coverage for GLP-1 drugs.  Assessment/Plan:   1. Vitamin D deficiency We will refill Vit D 50K IU once a week for 1 month with 0 refills. Will follow up level in 1-2 months to avoid over supplementation.  -Refill Vitamin D, Ergocalciferol, (DRISDOL) 1.25 MG (50000 UNIT) CAPS capsule; Take 1 capsule (50,000 Units total) by mouth every 7 (seven) days.  Dispense: 4 capsule; Refill: 0  2. Environmental and seasonal allergies We will refill Xyzal 5 mg daily for 3 months with 0 refills. Continue medications and monitor with PCP.  -Refill levocetirizine (XYZAL) 5 MG tablet; Take 1 tablet (5 mg total) by mouth every evening.  Dispense: 90 tablet; Refill: 0  3. Prediabetes Continue healthy eating plan and exercise.  4. Obesity, Current  BMI 37.8 Lauren Garza is currently in the action stage of change. As such, her goal is to continue with weight loss efforts. She has agreed to the Category 2 Plan.   Provided information on food resources.  Exercise goals: As is.  Behavioral modification strategies: increasing lean protein intake, decreasing simple carbohydrates, meal planning and cooking strategies, and keeping healthy foods in the home.  Lauren Garza has agreed to follow-up with our clinic in 2 weeks. She was informed of the importance of frequent follow-up visits to maximize her success with intensive lifestyle modifications for her multiple health conditions.   Objective:   Blood pressure 124/85, pulse 67, temperature 98.2 F (36.8 C), height '5\' 3"'$  (1.6 m), weight 213 lb (96.6 kg), last menstrual period 02/11/2018, SpO2 98 %. Body mass index is 37.73 kg/m.  General: Cooperative, alert, well developed, in no acute distress. HEENT: Conjunctivae and lids unremarkable. Cardiovascular: Regular rhythm.  Lungs: Normal work of breathing. Neurologic: No focal deficits.   Lab Results  Component Value Date   CREATININE 0.77 09/17/2022   BUN 11 09/17/2022   NA 142 09/17/2022   K 4.8 09/17/2022   CL 103 09/17/2022   CO2 26 09/17/2022   Lab Results  Component Value Date   ALT 19 09/17/2022   AST 22 09/17/2022   ALKPHOS 107 09/17/2022   BILITOT 0.2 09/17/2022   Lab Results  Component Value Date   HGBA1C 5.7 (H) 09/17/2022   HGBA1C 5.4 03/18/2022   HGBA1C 5.4 01/14/2022   HGBA1C 5.5 07/31/2021   HGBA1C  5.3 01/27/2021   Lab Results  Component Value Date   INSULIN 8.3 09/17/2022   INSULIN 10.3 03/18/2022   INSULIN 4.9 01/14/2022   INSULIN 7.0 07/31/2021   INSULIN 8.1 01/27/2021   Lab Results  Component Value Date   TSH 1.360 03/19/2020   Lab Results  Component Value Date   CHOL 164 09/17/2022   HDL 67 09/17/2022   LDLCALC 86 09/17/2022   TRIG 51 09/17/2022   CHOLHDL 2.4 09/17/2022   Lab Results   Component Value Date   VD25OH 62.6 09/17/2022   VD25OH 48.7 03/18/2022   VD25OH 69.7 01/14/2022   Lab Results  Component Value Date   WBC 9.1 11/07/2020   HGB 13.2 11/07/2020   HCT 41.9 11/07/2020   MCV 84.5 11/07/2020   PLT 311 11/07/2020   No results found for: "IRON", "TIBC", "FERRITIN"  Attestation Statements:   Reviewed by clinician on day of visit: allergies, medications, problem list, medical history, surgical history, family history, social history, and previous encounter notes.  I, Brendell Tyus, am acting as transcriptionist for [employer], PA.  I have reviewed the above documentation for accuracy and completeness, and I agree with the above. -  ***

## 2022-11-11 ENCOUNTER — Ambulatory Visit (INDEPENDENT_AMBULATORY_CARE_PROVIDER_SITE_OTHER): Payer: Commercial Managed Care - HMO | Admitting: Physician Assistant

## 2022-11-11 ENCOUNTER — Encounter (INDEPENDENT_AMBULATORY_CARE_PROVIDER_SITE_OTHER): Payer: Self-pay | Admitting: Physician Assistant

## 2022-11-11 VITALS — BP 118/78 | HR 66 | Temp 99.0°F | Ht 63.0 in | Wt 210.0 lb

## 2022-11-11 DIAGNOSIS — E66813 Obesity, class 3: Secondary | ICD-10-CM

## 2022-11-11 DIAGNOSIS — E559 Vitamin D deficiency, unspecified: Secondary | ICD-10-CM

## 2022-11-11 DIAGNOSIS — R7303 Prediabetes: Secondary | ICD-10-CM | POA: Diagnosis not present

## 2022-11-11 DIAGNOSIS — Z6837 Body mass index (BMI) 37.0-37.9, adult: Secondary | ICD-10-CM

## 2022-11-11 DIAGNOSIS — E669 Obesity, unspecified: Secondary | ICD-10-CM

## 2022-11-26 NOTE — Progress Notes (Signed)
Chief Complaint:   OBESITY Lauren Garza is here to discuss her progress with her obesity treatment plan along with follow-up of her obesity related diagnoses. [name] is on the Category 2 Plan and states she is following her eating plan approximately 50% of the time. [name] states she is exercising 0 minutes 0 times per week.  Today's visit was #: 69 Starting weight: 237 lbs Starting date: 03/19/2020 Today's weight: 210 lbs Today's date: 11/11/2022 Total lbs lost to date: 27 lbs Total lbs lost since last in-office visit: 3   Interim History: Lauren Garza has done well with weight loss.  Used portion control over Thanksgiving. Made "superheroes soup"/rotello/corn/beans/taco seasoning.   Working on finding new healthy recipes.  Provided hand out for chicken enchilada recipe.  Subjective:   1. Vitamin D deficiency Lauren Garza is currently taking prescription Vit D 50,000 IU once a week. Denies any side effects. Vit D level of 62.6--at goal.  2. Prediabetes A1c at 5.7/insulin at 5.3 on 09/17/2022 (completed Ozempic 0.25 weekly samples from PCP).  Does not think Ozempic made much difference with hunger/appetite.  Working on decreasing simple carbs and adhering to eating plan and exercise to promote weight loss.   Assessment/Plan:   1. Vitamin D deficiency Continue ergocalciferol weekly. Follow up vitamin D level 2-3 times yearly to avoid over supplementation.   2. Prediabetes Continueprescribed nutrition plan to decrease simple carbohydrates, decrease saturated fat, increase lean proteins and exercise to promote weight loss. healthy eating plan and exercise.  3. Obesity, Current BMI 37.3 Lauren Garza is currently in the action stage of change. As such, her goal is to continue with weight loss efforts. She has agreed to the Category 2 Plan.   Exercise goals: All adults should avoid inactivity. Some physical activity is better than none, and adults who participate in any amount of  physical activity gain some health benefits.  Behavioral modification strategies: increasing lean protein intake, decreasing simple carbohydrates, meal planning and cooking strategies, and holiday eating strategies .  [name] has agreed to follow-up with our clinic in 4 weeks. She was informed of the importance of frequent follow-up visits to maximize her success with intensive lifestyle modifications for her multiple health conditions.   Objective:   Blood pressure 118/78, pulse 66, temperature 99 F (37.2 C), height '5\' 3"'$  (1.6 m), weight 210 lb (95.3 kg), last menstrual period 02/11/2018, SpO2 100 %. Body mass index is 37.2 kg/m.  General: Cooperative, alert, well developed, in no acute distress. HEENT: Conjunctivae and lids unremarkable. Cardiovascular: Regular rhythm.  Lungs: Normal work of breathing. Neurologic: No focal deficits.   Lab Results  Component Value Date   CREATININE 0.77 09/17/2022   BUN 11 09/17/2022   NA 142 09/17/2022   K 4.8 09/17/2022   CL 103 09/17/2022   CO2 26 09/17/2022   Lab Results  Component Value Date   ALT 19 09/17/2022   AST 22 09/17/2022   ALKPHOS 107 09/17/2022   BILITOT 0.2 09/17/2022   Lab Results  Component Value Date   HGBA1C 5.7 (H) 09/17/2022   HGBA1C 5.4 03/18/2022   HGBA1C 5.4 01/14/2022   HGBA1C 5.5 07/31/2021   HGBA1C 5.3 01/27/2021   Lab Results  Component Value Date   INSULIN 8.3 09/17/2022   INSULIN 10.3 03/18/2022   INSULIN 4.9 01/14/2022   INSULIN 7.0 07/31/2021   INSULIN 8.1 01/27/2021   Lab Results  Component Value Date   TSH 1.360 03/19/2020   Lab Results  Component Value Date  CHOL 164 09/17/2022   HDL 67 09/17/2022   LDLCALC 86 09/17/2022   TRIG 51 09/17/2022   CHOLHDL 2.4 09/17/2022   Lab Results  Component Value Date   VD25OH 62.6 09/17/2022   VD25OH 48.7 03/18/2022   VD25OH 69.7 01/14/2022   Lab Results  Component Value Date   WBC 9.1 11/07/2020   HGB 13.2 11/07/2020   HCT 41.9  11/07/2020   MCV 84.5 11/07/2020   PLT 311 11/07/2020   No results found for: "IRON", "TIBC", "FERRITIN"  Attestation Statements:   Reviewed by clinician on day of visit: allergies, medications, problem list, medical history, surgical history, family history, social history, and previous encounter notes.  I, Brendell Tyus, am acting as transcriptionist for [employer], PA.  I have reviewed the above documentation for accuracy and completeness, and I agree with the above. -  Lamesha Tibbits,PA-C

## 2022-12-01 ENCOUNTER — Encounter (INDEPENDENT_AMBULATORY_CARE_PROVIDER_SITE_OTHER): Payer: Self-pay | Admitting: Adult Health

## 2022-12-01 ENCOUNTER — Ambulatory Visit (INDEPENDENT_AMBULATORY_CARE_PROVIDER_SITE_OTHER): Payer: Commercial Managed Care - HMO | Admitting: Adult Health

## 2022-12-01 VITALS — BP 116/78 | HR 62 | Temp 99.4°F | Ht 63.0 in | Wt 206.0 lb

## 2022-12-01 DIAGNOSIS — Z6836 Body mass index (BMI) 36.0-36.9, adult: Secondary | ICD-10-CM

## 2022-12-01 DIAGNOSIS — R519 Headache, unspecified: Secondary | ICD-10-CM

## 2022-12-01 DIAGNOSIS — E559 Vitamin D deficiency, unspecified: Secondary | ICD-10-CM | POA: Diagnosis not present

## 2022-12-01 DIAGNOSIS — E669 Obesity, unspecified: Secondary | ICD-10-CM | POA: Diagnosis not present

## 2022-12-01 MED ORDER — VITAMIN D (ERGOCALCIFEROL) 1.25 MG (50000 UNIT) PO CAPS: 50000.0000 [IU] | ORAL_CAPSULE | ORAL | 0 refills | Status: DC

## 2022-12-02 NOTE — Progress Notes (Unsigned)
Chief Complaint:   OBESITY Lauren Garza is here to discuss her progress with her obesity treatment plan along with follow-up of her obesity related diagnoses. Lauren Garza is on the Category 2 Plan and states she is following her eating plan approximately 50% of the time. Lauren Garza states she is not exercising.  Today's visit was #: 25 Starting weight: 42 LBS Starting date: 03/19/2020 Today's weight: 206 LBS Today's date: 12/01/2022 Total lbs lost to date: 31 LBS Total lbs lost since last in-office visit: 4 LBS  Interim History: ***  Subjective:   1. Vitamin D deficiency 02/15/2022, vitamin D level 62.6, stable.  Patient is currently on weekly ergocalciferol 50,000 IU weekly.  Patient denies nausea,vomiting or muscle weakness.  2. Nonintractable headache, unspecified chronicity pattern, unspecified headache type Patient reports daily headaches since February 2023.  No visual changes has nausea without vomiting.  Assessment/Plan:   1. Vitamin D deficiency Refill- Vitamin D, Ergocalciferol, (DRISDOL) 1.25 MG (50000 UNIT) CAPS capsule; Take 1 capsule (50,000 Units total) by mouth every 7 (seven) days.  Dispense: 4 capsule; Refill: 0  2. Nonintractable headache, unspecified chronicity pattern, unspecified headache type Referral to Dr. Sarina Ill.  Referral- Ambulatory referral to Neurology  3. Obesity current BMI-36.6 Lauren Garza is currently in the action stage of change. As such, her goal is to continue with weight loss efforts. She has agreed to the Category 2 Plan.   Exercise goals: All adults should avoid inactivity. Some physical activity is better than none, and adults who participate in any amount of physical activity gain some health benefits.  Behavioral modification strategies: increasing lean protein intake, decreasing simple carbohydrates, meal planning and cooking strategies, keeping healthy foods in the home, and planning for success.  Lauren Garza has agreed to  follow-up with our clinic in 2-3 weeks. She was informed of the importance of frequent follow-up visits to maximize her success with intensive lifestyle modifications for her multiple health conditions.   Objective:   Blood pressure 116/78, pulse 62, temperature 99.4 F (37.4 C), height '5\' 3"'$  (1.6 m), weight 206 lb (93.4 kg), last menstrual period 02/11/2018, SpO2 98 %. Body mass index is 36.49 kg/m.  General: Cooperative, alert, well developed, in no acute distress. HEENT: Conjunctivae and lids unremarkable. Cardiovascular: Regular rhythm.  Lungs: Normal work of breathing. Neurologic: No focal deficits.   Lab Results  Component Value Date   CREATININE 0.77 09/17/2022   BUN 11 09/17/2022   NA 142 09/17/2022   K 4.8 09/17/2022   CL 103 09/17/2022   CO2 26 09/17/2022   Lab Results  Component Value Date   ALT 19 09/17/2022   AST 22 09/17/2022   ALKPHOS 107 09/17/2022   BILITOT 0.2 09/17/2022   Lab Results  Component Value Date   HGBA1C 5.7 (H) 09/17/2022   HGBA1C 5.4 03/18/2022   HGBA1C 5.4 01/14/2022   HGBA1C 5.5 07/31/2021   HGBA1C 5.3 01/27/2021   Lab Results  Component Value Date   INSULIN 8.3 09/17/2022   INSULIN 10.3 03/18/2022   INSULIN 4.9 01/14/2022   INSULIN 7.0 07/31/2021   INSULIN 8.1 01/27/2021   Lab Results  Component Value Date   TSH 1.360 03/19/2020   Lab Results  Component Value Date   CHOL 164 09/17/2022   HDL 67 09/17/2022   LDLCALC 86 09/17/2022   TRIG 51 09/17/2022   CHOLHDL 2.4 09/17/2022   Lab Results  Component Value Date   VD25OH 62.6 09/17/2022   VD25OH 48.7 03/18/2022   VD25OH 69.7  01/14/2022   Lab Results  Component Value Date   WBC 9.1 11/07/2020   HGB 13.2 11/07/2020   HCT 41.9 11/07/2020   MCV 84.5 11/07/2020   PLT 311 11/07/2020   No results found for: "IRON", "TIBC", "FERRITIN"  Attestation Statements:   Reviewed by clinician on day of visit: allergies, medications, problem list, medical history, surgical  history, family history, social history, and previous encounter notes.  I, Davy Pique, RMA, am acting as Location manager for Mina Marble, NP.  I have reviewed the above documentation for accuracy and completeness, and I agree with the above. -  ***

## 2022-12-15 ENCOUNTER — Ambulatory Visit (INDEPENDENT_AMBULATORY_CARE_PROVIDER_SITE_OTHER): Payer: Medicaid Other | Admitting: Adult Health

## 2022-12-15 ENCOUNTER — Encounter (INDEPENDENT_AMBULATORY_CARE_PROVIDER_SITE_OTHER): Payer: Self-pay | Admitting: Adult Health

## 2022-12-15 VITALS — BP 114/78 | HR 63 | Temp 98.5°F | Ht 63.0 in | Wt 213.0 lb

## 2022-12-15 DIAGNOSIS — E669 Obesity, unspecified: Secondary | ICD-10-CM | POA: Diagnosis not present

## 2022-12-15 DIAGNOSIS — Z6837 Body mass index (BMI) 37.0-37.9, adult: Secondary | ICD-10-CM

## 2022-12-15 DIAGNOSIS — E559 Vitamin D deficiency, unspecified: Secondary | ICD-10-CM | POA: Diagnosis not present

## 2022-12-15 MED ORDER — WEGOVY 0.25 MG/0.5ML ~~LOC~~ SOAJ
0.2500 mg | SUBCUTANEOUS | 0 refills | Status: DC
  Filled 2022-12-22: qty 2, 28d supply, fill #0

## 2022-12-15 MED ORDER — VITAMIN D (ERGOCALCIFEROL) 1.25 MG (50000 UNIT) PO CAPS: 50000.0000 [IU] | ORAL_CAPSULE | ORAL | 0 refills | Status: DC

## 2022-12-21 ENCOUNTER — Encounter (INDEPENDENT_AMBULATORY_CARE_PROVIDER_SITE_OTHER): Payer: Self-pay | Admitting: Adult Health

## 2022-12-21 ENCOUNTER — Other Ambulatory Visit (HOSPITAL_COMMUNITY): Payer: Self-pay

## 2022-12-21 ENCOUNTER — Telehealth (INDEPENDENT_AMBULATORY_CARE_PROVIDER_SITE_OTHER): Payer: Self-pay

## 2022-12-21 NOTE — Telephone Encounter (Addendum)
PA submitted for [facility] 12/21/2022, waiting for determination.  12/23/22 PA was denied stating that patient did not meet PA requirements.

## 2022-12-22 ENCOUNTER — Other Ambulatory Visit (INDEPENDENT_AMBULATORY_CARE_PROVIDER_SITE_OTHER): Payer: Self-pay | Admitting: Adult Health

## 2022-12-22 ENCOUNTER — Other Ambulatory Visit (HOSPITAL_COMMUNITY): Payer: Self-pay

## 2022-12-22 NOTE — Progress Notes (Signed)
Chief Complaint:   OBESITY Lauren Garza is here to discuss her progress with her obesity treatment plan along with follow-up of her obesity related diagnoses. Lauren Garza is on the Category 2 Plan and states she is following her eating plan approximately 50% of the time. Lauren Garza states she is not exercising.   Today's visit was #: 37  Starting weight: 237 lbs Starting date: 03/19/2020 Today's weight: 213 lbs Today's date: 12/15/2022 Total lbs lost to date: 24 lbs Total lbs lost since last in-office visit: +7 lbs  Interim History:  [name] sampled several of the additional breakfast options to offer some variety to her Cat 2 meal plan.  2024 Health Plans 1) Avoid weight gain above 215 lbs.  Current weight 213 lbs 2) Resume regular exercise  Of Note- She denies family hx of MEN 2 or MTC. She denies personal hx of pancreatitis. She has been on Rybelsus in past.  Subjective:   1. Vitamin D deficiency 09/17/2022, vitamin D level was 62.6- stable. 04/02/2020- started on weekly Ergocalciferol- denies N/V/Muscle Weakness. She endorses stable energy levels.  Assessment/Plan:   1. Vitamin D deficiency Refill - Vitamin D, Ergocalciferol, (DRISDOL) 1.25 MG (50000 UNIT) CAPS capsule; Take 1 capsule (50,000 Units total) by mouth every 7 (seven) days.  Dispense: 4 capsule; Refill: 0  2. Obesity current BMI-37.9 - Semaglutide-Weight Management (WEGOVY) 0.25 MG/0.5ML SOAJ; Inject 0.25 mg into the skin once a week.  Dispense: 2 mL; Refill: 0  Lauren Garza is currently in the action stage of change. As such, her goal is to continue with weight loss efforts. She has agreed to the Category 2 Plan and keeping a food journal and adhering to recommended goals of 200-300 calories and 20+ protein at breakfast. .   Exercise goals:  As is.  Behavioral modification strategies: increasing lean protein intake, decreasing simple carbohydrates, meal planning and cooking strategies, keeping healthy foods  in the home, and planning for success.  Lauren Garza has agreed to follow-up with our clinic in 4 weeks. She was informed of the importance of frequent follow-up visits to maximize her success with intensive lifestyle modifications for her multiple health conditions.   Objective:   Blood pressure 114/78, pulse 63, temperature 98.5 F (36.9 C), height '5\' 3"'$  (1.6 m), weight 213 lb (96.6 kg), last menstrual period 02/11/2018, SpO2 96 %. Body mass index is 37.73 kg/m.  General: Cooperative, alert, well developed, in no acute distress. HEENT: Conjunctivae and lids unremarkable. Cardiovascular: Regular rhythm.  Lungs: Normal work of breathing. Neurologic: No focal deficits.   Lab Results  Component Value Date   CREATININE 0.77 09/17/2022   BUN 11 09/17/2022   NA 142 09/17/2022   K 4.8 09/17/2022   CL 103 09/17/2022   CO2 26 09/17/2022   Lab Results  Component Value Date   ALT 19 09/17/2022   AST 22 09/17/2022   ALKPHOS 107 09/17/2022   BILITOT 0.2 09/17/2022   Lab Results  Component Value Date   HGBA1C 5.7 (H) 09/17/2022   HGBA1C 5.4 03/18/2022   HGBA1C 5.4 01/14/2022   HGBA1C 5.5 07/31/2021   HGBA1C 5.3 01/27/2021   Lab Results  Component Value Date   INSULIN 8.3 09/17/2022   INSULIN 10.3 03/18/2022   INSULIN 4.9 01/14/2022   INSULIN 7.0 07/31/2021   INSULIN 8.1 01/27/2021   Lab Results  Component Value Date   TSH 1.360 03/19/2020   Lab Results  Component Value Date   CHOL 164 09/17/2022   HDL 67 09/17/2022  LDLCALC 86 09/17/2022   TRIG 51 09/17/2022   CHOLHDL 2.4 09/17/2022   Lab Results  Component Value Date   VD25OH 62.6 09/17/2022   VD25OH 48.7 03/18/2022   VD25OH 69.7 01/14/2022   Lab Results  Component Value Date   WBC 9.1 11/07/2020   HGB 13.2 11/07/2020   HCT 41.9 11/07/2020   MCV 84.5 11/07/2020   PLT 311 11/07/2020   No results found for: "IRON", "TIBC", "FERRITIN"  Attestation Statements:   Reviewed by clinician on day of visit:  allergies, medications, problem list, medical history, surgical history, family history, social history, and previous encounter notes.  I, Davy Pique, RMA, am acting as Location manager for Mina Marble, NP.  I have reviewed the above documentation for accuracy and completeness, and I agree with the above. -  Christan Ciccarelli d. [place], NP-C

## 2022-12-29 ENCOUNTER — Other Ambulatory Visit (INDEPENDENT_AMBULATORY_CARE_PROVIDER_SITE_OTHER): Payer: Self-pay | Admitting: Adult Health

## 2022-12-29 DIAGNOSIS — K219 Gastro-esophageal reflux disease without esophagitis: Secondary | ICD-10-CM

## 2023-01-05 ENCOUNTER — Ambulatory Visit: Payer: Medicaid Other | Admitting: Neurology

## 2023-01-05 ENCOUNTER — Encounter: Payer: Self-pay | Admitting: Neurology

## 2023-01-05 VITALS — BP 130/77 | HR 74 | Ht 63.0 in | Wt 213.0 lb

## 2023-01-05 DIAGNOSIS — G43009 Migraine without aura, not intractable, without status migrainosus: Secondary | ICD-10-CM | POA: Diagnosis not present

## 2023-01-05 DIAGNOSIS — R51 Headache with orthostatic component, not elsewhere classified: Secondary | ICD-10-CM

## 2023-01-05 DIAGNOSIS — R5383 Other fatigue: Secondary | ICD-10-CM

## 2023-01-05 DIAGNOSIS — Z820 Family history of epilepsy and other diseases of the nervous system: Secondary | ICD-10-CM

## 2023-01-05 DIAGNOSIS — H539 Unspecified visual disturbance: Secondary | ICD-10-CM

## 2023-01-05 DIAGNOSIS — G4719 Other hypersomnia: Secondary | ICD-10-CM

## 2023-01-05 DIAGNOSIS — R519 Headache, unspecified: Secondary | ICD-10-CM

## 2023-01-05 DIAGNOSIS — G441 Vascular headache, not elsewhere classified: Secondary | ICD-10-CM

## 2023-01-05 MED ORDER — KETOROLAC TROMETHAMINE 60 MG/2ML IM SOLN
60.0000 mg | Freq: Once | INTRAMUSCULAR | Status: AC
  Administered 2023-01-05: 60 mg via INTRAMUSCULAR

## 2023-01-05 MED ORDER — UBRELVY 100 MG PO TABS: 100.0000 mg | ORAL_TABLET | ORAL | 0 refills | Status: DC | PRN

## 2023-01-05 MED ORDER — NORTRIPTYLINE HCL 10 MG PO CAPS: 10.0000 mg | ORAL_CAPSULE | Freq: Every day | ORAL | 3 refills | Status: DC

## 2023-01-05 MED ORDER — RIZATRIPTAN BENZOATE 10 MG PO TBDP: 10.0000 mg | ORAL_TABLET | ORAL | 11 refills | Status: DC | PRN

## 2023-01-05 MED ORDER — ONDANSETRON 4 MG PO TBDP: 4.0000 mg | ORAL_TABLET | Freq: Three times a day (TID) | ORAL | 3 refills | Status: DC | PRN

## 2023-01-05 NOTE — Progress Notes (Signed)
[facility]    Provider:  Dr Jaynee Eagles Requesting Provider: Esaw Grandchild, NP Primary Care Provider:  Lin Landsman, MD  CC: Migraines  HPI: Here for new concern headaches, past medical history bursitis of both hips, GERD, acute reaction to stress, allergies, chronic sinusitis, transaminitis, insomnia, vitamin B12 deficiency, hyperlipidemia, morbid obesity, asthma, meralgia paresthetica, kidney stones, hysterectomy, generalized headaches.   She is here alone. She has had sporadic headaches in the past but never sever headaches, a few months ago, she went and saw Dr. Redmond Baseman and he said not sinuses or allergies, nurtec did not help, nausea, on both temples, pulsating/pounding/throbbing, light and sound sensitivity, she layed down in a quiet room that helped, moving made it worse. 2nd migraines she has ever had. Lasted for days. Nothing helped, waxed and wained for days. She went to the ED, given imitrex, reglan, toradol and took a while but she felt better. Maternal aunt had headaches but had a brain aneurysm, no pulsatile tinnitus. She can wake up with headaches, no known snoring, doesn't feel refereshed, she may doze off sometimes and she can sit at a red light and fall asleep. Blurry vision, worse in the morning and laying down, hurts to move, new headache, moderate to severe. No other focal neurologic deficits, associated symptoms, inciting events or modifiable factors.  Reviewed notes, labs and imaging from outside physicians, which showed:  From a thorough review of records, medications tried that can be used in migraine management include: Tylenol, Benadryl, Decadron, Topamax contraindicated due to kidney stones, propranolol contraindicated due to asthma, blood pressure medications, ibuprofen, ketorolac, lisinopril, Robaxin, Reglan, Zofran, Phenergan, nurtec, propranolol  CT head 2023: COMPARISON:  Brain CT 10/16/2010   FINDINGS: Brain: No evidence of acute infarction,  hemorrhage, hydrocephalus, extra-axial collection or mass lesion/mass effect.   Vascular: No hyperdense vessel or unexpected calcification.   Skull: Intact.   Sinuses/Orbits: Post sinus surgery. Mucosal thickening involving the paranasal sinuses. No air-fluid levels. Mastoid air cells are unremarkable.   Other: None   IMPRESSION: 1. No acute intracranial abnormalities.      Latest Ref Rng & Units 11/07/2020    9:15 AM 03/19/2020   11:40 AM 02/10/2018    5:17 AM  CBC  WBC 4.0 - 10.5 K/uL 9.1  9.2  10.7   Hemoglobin 12.0 - 15.0 g/dL 13.2  13.1  9.3   Hematocrit 36.0 - 46.0 % 41.9  40.9  29.3   Platelets 150 - 400 K/uL 311  345  370       Latest Ref Rng & Units 09/17/2022   10:27 AM 03/18/2022   12:20 PM 01/14/2022   10:46 AM  CMP  Glucose 70 - 99 mg/dL 82  70  77   BUN 6 - 20 mg/dL '11  15  13   '$ Creatinine 0.57 - 1.00 mg/dL 0.77  0.89  0.73   Sodium 134 - 144 mmol/L 142  138  139   Potassium 3.5 - 5.2 mmol/L 4.8  4.2  4.6   Chloride 96 - 106 mmol/L 103  100  102   CO2 20 - 29 mmol/L '26  24  24   '$ Calcium 8.7 - 10.2 mg/dL 9.0  9.3  9.2   Total Protein 6.0 - 8.5 g/dL 7.0  6.9  6.9   Total Bilirubin 0.0 - 1.2 mg/dL 0.2  0.6  0.4   Alkaline Phos 44 - 121 IU/L 107  101  93   AST 0 - 40 IU/L 22  22  17   ALT 0 - 32 IU/L '19  23  16      '$ CC:  Thigh pain  HPI 01/16/2020:  Lauren Garza is a 42 y.o. female here as requested by Esaw Grandchild, NP for pain in the right thigh, merlgi paresthetica, severe obesity BMI of 40-44. PMHx plantar fasciitis, hypertension, frequent headaches, asthma, kidney stones, hysterectomy.  I reviewed Tirraney Osborne's notes, patient came in for pain in her thighs, bilateral thigh pain, been ongoing at least several months but stable, thighs are very tender to touch, she has not seen PT or chiropractor, she is obese and asked for phentermine, thigh pain positive for numbness, on examination tenderness to palpation about her thighs bilaterally, she was  given a shot of Depo-Medrol, she was started on gabapentin, suspicion for meralgia paresthetica, weight loss was recommended for her severe obesity class III.  Symptoms started years ago with numbness in the outer thighs. No inciting events, no accidents or trauma or anything that preceded. In October/Novemebr of last year she was seen for plantar fasciitis and she was walking differently and the pain in the thigh worsened it is burning, throbbing, severe, radiating, in the lateral thigh area, doesn't move, doesn't radiate past the knees, no low back pain associated, symmetric and bilateral, rolling over in bed makes it worse. tanding for long periods of time make it worse. Sitting makes it worse. Can be severe. Also buttocks pain. She went to physical therapy and it did not help. She has had insurance issues. Gabapentin doesn't help. This is everyday, severe, burning, nothing helps, comes and goes all day. She is going to the bathroom more, feels stream is weaker. No other focal neurologic deficits, associated symptoms, inciting events or modifiable factors.No abdominal pain.   Reviewed notes, labs and imaging from outside physicians, which showed:  CT Abdomen Pelvis 02/06/2018 reviewed report: 1. Status post hysterectomy. Edema and ill-defined complex fluid within the pelvis, likely due to postoperative chronic hemorrhage/hematoma. No drainable abscess. 2. No bowel obstruction or other acute complication. 3.  Possible constipation. 4. Mild bladder wall thickening could represent cystitis. 5. Left nephrolithiasis.    Review of Systems: Patient complains of symptoms per HPI as well as the following symptoms: weight gain. Pertinent negatives and positives per HPI. All others negative.   Social History   Socioeconomic History   Marital status: Divorced    Spouse name: Not on file   Number of children: 3   Years of education: Not on file   Highest education level: 11th grade  Occupational  History   Occupation: Pensions consultant  Tobacco Use   Smoking status: Former   Smokeless tobacco: Never   Tobacco comments:    rare smoker 9 years ago  Media planner   Vaping Use: Never used  Substance and Sexual Activity   Alcohol use: Yes    Comment: Rare, 1x year   Drug use: No   Sexual activity: Yes    Birth control/protection: Surgical  Other Topics Concern   Not on file  Social History Narrative   Lives at home with her children and parents    Right handed   Caffeine: coffee, 1 cup/day   Social Determinants of Health   Financial Resource Strain: Not on file  Food Insecurity: Not on file  Transportation Needs: Not on file  Physical Activity: Not on file  Stress: Not on file  Social Connections: Not on file  Intimate Partner Violence: Not on file  Family History  Problem Relation Age of Onset   Hypertension Mother    Diabetes Mother    Other Mother        questionable nerve issue, has numbness in thighs too   Sleep apnea Mother    Obesity Mother    Heart attack Father    Headache Paternal Aunt    Diabetes Maternal Grandmother     Past Medical History:  Diagnosis Date   Abnormal Pap smear    Acid reflux    Allergies    Asthma    Bacterial vaginosis    Bursitis    Chlamydia trachomatis infection 2012   Constipation    Depression    DUB (dysfunctional uterine bleeding)    Family history of adverse reaction to anesthesia    mom has n and V past surgery   Frequent headaches    migraines   Hypertension    "not anymore", taken off BP meds per pt report   Meralgia paresthetica    Plantar fasciitis    right     Patient Active Problem List   Diagnosis Date Noted   Hematochezia 10/05/2022   Greater trochanteric bursitis of both hips 07/16/2022   Gastroesophageal reflux disease 07/16/2022   Acute reaction to stress 06/19/2022   Environmental and seasonal allergies 09/15/2021   At risk for side effect of medication 04/01/2021   Chronic sinusitis  02/25/2021   Transaminitis 02/11/2021   History of tooth extraction 12/23/2020   Insomnia 12/23/2020   Hemorrhagic ovarian cyst left 11/12/2020   SOB (shortness of breath) on exertion 09/26/2020   Vitamin B12 deficiency 08/19/2020   Other hyperlipidemia, pure 08/19/2020   Vitamin D deficiency 05/21/2020   Asthma 05/21/2020   Insulin resistance 05/06/2020   Meralgia paresthetica of both lower extremities 01/16/2020   Class 3 severe obesity with serious comorbidity and body mass index (BMI) of 40.0 to 44.9 in adult [facility]) 01/16/2020   Abnormal laboratory test 03/09/2018   Kidney stone on left side 02/06/2018   S/P vaginal hysterectomy 02/01/2018   Generalized headaches 10/25/2017    Past Surgical History:  Procedure Laterality Date   GYNECOLOGIC CRYOSURGERY     I & D EXTREMITY Right 04/04/2015   Procedure: IRRIGATION AND DEBRIDEMENT RIGHT WRIST;  Surgeon: Leanora Cover, MD;  Location: [place];  Service: Orthopedics;  Laterality: Right;   SINUS ENDO WITH FUSION N/A 01/25/2017   Procedure: SINUS ENDO WITH FUSION;  Surgeon: Melida Quitter, MD;  Location: [place];  Service: ENT;  Laterality: N/A;   TUBAL LIGATION     VAGINAL HYSTERECTOMY Bilateral 02/01/2018   Procedure: HYSTERECTOMY VAGINAL WITH SALPINGECTOMY;  Surgeon: Chancy Milroy, MD;  Location: [facility];  Service: Gynecology;  Laterality: Bilateral;   WISDOM TOOTH EXTRACTION     WRIST SURGERY      Current Outpatient Medications  Medication Sig Dispense Refill   albuterol (ACCUNEB) 0.63 MG/3ML nebulizer solution Take by nebulization.     Boric Acid Vaginal 600 MG SUPP INSERT 1 SUPPOSITORY VAGINALLY AT BEDTIME. FOR VAGINAL USE     cyanocobalamin (,VITAMIN B-12,) 1000 MCG/ML injection Inject 1,000 mcg into the skin every 30 (thirty) days.     fluconazole (DIFLUCAN) 150 MG tablet Take 150 mg by mouth daily.     levocetirizine (XYZAL) 5 MG tablet Take 1 tablet (5 mg total) by mouth every evening. 90 tablet 0   montelukast  (SINGULAIR) 10 MG tablet SMARTSIG:1 Tablet(s) By Mouth Every Evening     nortriptyline (PAMELOR)  10 MG capsule Take 1 capsule (10 mg total) by mouth at bedtime. 30 capsule 3   omeprazole (PRILOSEC) 20 MG capsule 1 po qd or BID 60 capsule 5   ondansetron (ZOFRAN-ODT) 4 MG disintegrating tablet Take 1-2 tablets (4-8 mg total) by mouth every 8 (eight) hours as needed. 30 tablet 3   rizatriptan (MAXALT-MLT) 10 MG disintegrating tablet Take 1 tablet (10 mg total) by mouth as needed for migraine. May repeat in 2 hours if needed 9 tablet 11   spironolactone (ALDACTONE) 50 MG tablet Take by mouth.     SYMBICORT 80-4.5 MCG/ACT inhaler SMARTSIG:2 Puff(s) By Mouth Twice Daily     Ubrogepant (UBRELVY) 100 MG TABS Take 1 tablet (100 mg total) by mouth every 2 (two) hours as needed. Maximum '200mg'$  a day. 4 tablet 0   valACYclovir (VALTREX) 1000 MG tablet Take 1,000 mg by mouth 3 (three) times daily.     Vitamin D, Ergocalciferol, (DRISDOL) 1.25 MG (50000 UNIT) CAPS capsule Take 1 capsule (50,000 Units total) by mouth every 7 (seven) days. 4 capsule 0   Cholecalciferol (VITAMIN D HIGH POTENCY PO) Take 5,000 Units by mouth every other day.     Semaglutide-Weight Management (WEGOVY) 0.25 MG/0.5ML SOAJ Inject 0.25 mg into the skin once a week. 2 mL 0   No current facility-administered medications for this visit.    Allergies as of 01/05/2023 - Review Complete 01/05/2023  Allergen Reaction Noted   Ceftin [cefuroxime]  05/01/2021   Clindamycin/lincomycin Hives 08/15/2011   Dust mite extract  01/26/2018   Flagyl [metronidazole hcl] Hives 08/15/2011   Lincomycin hcl Hives 08/15/2011   Metformin and related  04/01/2021    Vitals: BP 130/77   Pulse 74   Ht '5\' 3"'$  (1.6 m)   Wt 213 lb (96.6 kg)   LMP 02/11/2018 (Exact Date)   BMI 37.73 kg/m  Last Weight:  Wt Readings from Last 1 Encounters:  01/05/23 213 lb (96.6 kg)   Last Height:   Ht Readings from Last 1 Encounters:  01/05/23 '5\' 3"'$  (1.6 m)     Physical exam: Exam: Gen: NAD, conversant, well nourised, obese, well groomed                     CV: RRR, no MRG. No Carotid Bruits. No peripheral edema, warm, nontender Eyes: Conjunctivae clear without exudates or hemorrhage  Neuro: Detailed Neurologic Exam  Speech:    Speech is normal; fluent and spontaneous with normal comprehension.  Cognition:    The patient is oriented to person, place, and time;     recent and remote memory intact;     language fluent;     normal attention, concentration,     fund of knowledge Cranial Nerves:    The pupils are equal, round, and reactive to light. The fundi are flat. Visual fields are full to finger confrontation. Extraocular movements are intact. Trigeminal sensation is intact and the muscles of mastication are normal. The face is symmetric. The palate elevates in the midline. Hearing intact. Voice is normal. Shoulder shrug is normal. The tongue has normal motion without fasciculations.   Coordination: nml  Gait: nml  Motor Observation:    No asymmetry, no atrophy, and no involuntary movements noted. Tone:    Normal muscle tone.    Posture:    Posture is normal. normal erect    Strength:    Strength is V/V in the upper and lower limbs.      Sensation: intact  to LT     Reflex Exam:  DTR's:    Deep tendon reflexes in the upper and lower extremities are normal bilaterally.   Toes:    The toes are downgoing bilaterally.   Clonus:    Clonus is absent.    Assessment/Plan: 42 year old with likely migraines however given concerning symptoms needs thorough evaluation.  - Emergency: At onset headache take medication: Rizatriptan and Ondansetron. Can repeat in 2 hours. Roselyn Meier - I can prescribe if you like it, can take all together. RIGHT at onset of headache. Toradol shot and ubrelvy today - take Roselyn Meier get home and take when you get home Emergency: Roselyn Meier: take one and then another 2 hours later RIGHT at onset of  headache - MRI brain w/wo contrast: MRI brain due to concerning symptoms of morning headaches, positional,vision changes, worsening headaches  to look for space occupying mass, chiari or intracranial hypertension (pseudotumor), strokes, malignancies, vasculidities, demyelination(multiple sclerosis) or other - Sleep doctor for sleep testing - falling asleep at red lights, morning headaches ess 14 - More frequent migraines need a preventative: Qulipta (Ajovy, emgality, nurtec, ubrelvy) New meds Preventative: Nortriptyline at bedtime    Orders Placed This Encounter  Procedures   MR BRAIN W WO CONTRAST   Basic Metabolic Panel   TSH Rfx on Abnormal to Free T4   Ambulatory referral to Sleep Studies   Meds ordered this encounter  Medications   rizatriptan (MAXALT-MLT) 10 MG disintegrating tablet    Sig: Take 1 tablet (10 mg total) by mouth as needed for migraine. May repeat in 2 hours if needed    Dispense:  9 tablet    Refill:  11   ondansetron (ZOFRAN-ODT) 4 MG disintegrating tablet    Sig: Take 1-2 tablets (4-8 mg total) by mouth every 8 (eight) hours as needed.    Dispense:  30 tablet    Refill:  3   Ubrogepant (UBRELVY) 100 MG TABS    Sig: Take 1 tablet (100 mg total) by mouth every 2 (two) hours as needed. Maximum '200mg'$  a day.    Dispense:  4 tablet    Refill:  0   nortriptyline (PAMELOR) 10 MG capsule    Sig: Take 1 capsule (10 mg total) by mouth at bedtime.    Dispense:  30 capsule    Refill:  3   ketorolac (TORADOL) injection 60 mg    Cc: Danford, Berna Spare, NP,  Lin Landsman, MD  Sarina Ill, MD  [facility] [address] [place], [place] [address]  Phone [phone] Fax [phone]  I spent 70 minutes of face-to-face and non-face-to-face time with patient on the  1. Migraine without aura and without status migrainosus, not intractable   2. Other fatigue   3. Excessive daytime sleepiness   4. Morning headache   5. Acute  intractable headache, unspecified headache type   6. Positional headache   7. Vision changes   8. Worsening headaches   9. FHx: multiple sclerosis   10. Other vascular headache    diagnosis.  This included previsit chart review, lab review, study review, order entry, electronic health record documentation, patient education on the different diagnostic and therapeutic options, counseling and coordination of care, risks and benefits of management, compliance, or risk factor reduction

## 2023-01-05 NOTE — Progress Notes (Signed)
Verbal order for Toradol, '60mg'$  IM, by Dr. Jaynee Eagles for migraine.   Under aseptic technique, Toradol '60mg'$  IM to Right upper outer gluteal quadrant.  Pt stated stinging after injection which she has noted previously when given this medication.  Bandaid applied.   ESS 14.

## 2023-01-05 NOTE — Patient Instructions (Addendum)
Emergency: At onset headache take medication: Rizatriptan and Ondansetron. Can repeat in 2 hours. Lauren Garza - I can prescribe if you like it, can take all together. RIGHT at onset of headache. Toradol shot and ubrelvy today - take Lauren Garza get home and take when you get home Emergency: Lauren Garza: take one and then another 2 hours later RIGHT at onset of headache MRI brain w/wo contrast Sleep doctor for sleep testing - falling asleep at red lights, morning headaches ess 14 More frequent migraines need a preventative: Qulipta (Ajovy, emgality, nurtec, ubrelvy) New meds Preventative: Nortriptyline at bedtime  Meds ordered this encounter  Medications   rizatriptan (MAXALT-MLT) 10 MG disintegrating tablet    Sig: Take 1 tablet (10 mg total) by mouth as needed for migraine. May repeat in 2 hours if needed    Dispense:  9 tablet    Refill:  11   ondansetron (ZOFRAN-ODT) 4 MG disintegrating tablet    Sig: Take 1-2 tablets (4-8 mg total) by mouth every 8 (eight) hours as needed.    Dispense:  30 tablet    Refill:  3   Ubrogepant (UBRELVY) 100 MG TABS    Sig: Take 1 tablet (100 mg total) by mouth every 2 (two) hours as needed. Maximum '200mg'$  a day.    Dispense:  4 tablet    Refill:  0   nortriptyline (PAMELOR) 10 MG capsule    Sig: Take 1 capsule (10 mg total) by mouth at bedtime.    Dispense:  30 capsule    Refill:  3   ketorolac (TORADOL) injection 60 mg     Migraine Headache A migraine headache is an intense, throbbing pain on one side or both sides of the head. Migraine headaches may also cause other symptoms, such as nausea, vomiting, and sensitivity to light and noise. A migraine headache can last from 4 hours to 3 days. Talk with your doctor about what things may bring on (trigger) your migraine headaches. What are the causes? The exact cause of this condition is not known. However, a migraine may be caused when nerves in the brain become irritated and release chemicals that cause  inflammation of blood vessels. This inflammation causes pain. This condition may be triggered or caused by: Drinking alcohol. Smoking. Taking medicines, such as: Medicine used to treat chest pain (nitroglycerin). Birth control pills. Estrogen. Certain blood pressure medicines. Eating or drinking products that contain nitrates, glutamate, aspartame, or tyramine. Aged cheeses, chocolate, or caffeine may also be triggers. Doing physical activity. Other things that may trigger a migraine headache include: Menstruation. Pregnancy. Hunger. Stress. Lack of sleep or too much sleep. Weather changes. Fatigue. What increases the risk? The following factors may make you more likely to experience migraine headaches: Being a certain age. This condition is more common in people who are 57-53 years old. Being female. Having a family history of migraine headaches. Being Caucasian. Having a mental health condition, such as depression or anxiety. Being obese. What are the signs or symptoms? The main symptom of this condition is pulsating or throbbing pain. This pain may: Happen in any area of the head, such as on one side or both sides. Interfere with daily activities. Get worse with physical activity. Get worse with exposure to bright lights or loud noises. Other symptoms may include: Nausea. Vomiting. Dizziness. General sensitivity to bright lights, loud noises, or smells. Before you get a migraine headache, you may get warning signs (an aura). An aura may include: Seeing flashing lights or having  blind spots. Seeing bright spots, halos, or zigzag lines. Having tunnel vision or blurred vision. Having numbness or a tingling feeling. Having trouble talking. Having muscle weakness. Some people have symptoms after a migraine headache (postdromal phase), such as: Feeling tired. Difficulty concentrating. How is this diagnosed? A migraine headache can be diagnosed based on: Your symptoms. A  physical exam. Tests, such as: CT scan or an MRI of the head. These imaging tests can help rule out other causes of headaches. Taking fluid from the spine (lumbar puncture) and analyzing it (cerebrospinal fluid analysis, or CSF analysis). How is this treated? This condition may be treated with medicines that: Relieve pain. Relieve nausea. Prevent migraine headaches. Treatment for this condition may also include: Acupuncture. Lifestyle changes like avoiding foods that trigger migraine headaches. Biofeedback. Cognitive behavioral therapy. Follow these instructions at home: Medicines Take over-the-counter and prescription medicines only as told by your health care provider. Ask your health care provider if the medicine prescribed to you: Requires you to avoid driving or using heavy machinery. Can cause constipation. You may need to take these actions to prevent or treat constipation: Drink enough fluid to keep your urine pale yellow. Take over-the-counter or prescription medicines. Eat foods that are high in fiber, such as beans, whole grains, and fresh fruits and vegetables. Limit foods that are high in fat and processed sugars, such as fried or sweet foods. Lifestyle Do not drink alcohol. Do not use any products that contain nicotine or tobacco, such as cigarettes, e-cigarettes, and chewing tobacco. If you need help quitting, ask your health care provider. Get at least 8 hours of sleep every night. Find ways to manage stress, such as meditation, deep breathing, or yoga. General instructions Keep a journal to find out what may trigger your migraine headaches. For example, write down: What you eat and drink. How much sleep you get. Any change to your diet or medicines. If you have a migraine headache: Avoid things that make your symptoms worse, such as bright lights. It may help to lie down in a dark, quiet room. Do not drive or use heavy machinery. Ask your health care provider  what activities are safe for you while you are experiencing symptoms. Keep all follow-up visits as told by your health care provider. This is important. Contact a health care provider if: You develop symptoms that are different or more severe than your usual migraine headache symptoms. You have more than 15 headache days in one month. Get help right away if: Your migraine headache becomes severe. Your migraine headache lasts longer than 72 hours. You have a fever. You have a stiff neck. You have vision loss. Your muscles feel weak or like you cannot control them. You start to lose your balance often. You have trouble walking. You faint. You have a seizure. Summary A migraine headache is an intense, throbbing pain on one side or both sides of the head. Migraines may also cause other symptoms, such as nausea, vomiting, and sensitivity to light and noise. This condition may be treated with medicines and lifestyle changes. You may also need to avoid certain things that trigger a migraine headache. Keep a journal to find out what may trigger your migraine headaches. Contact your health care provider if you have more than 15 headache days in a month or you develop symptoms that are different or more severe than your usual migraine headache symptoms. This information is not intended to replace advice given to you by your health care provider.  Make sure you discuss any questions you have with your health care provider.Analgesic Rebound Headache An analgesic rebound headache, sometimes called a medication overuse headache or a drug-induced headache, is a secondary disorder that is caused by the overuse of pain medicine (analgesic) to treat the original (primary) headache. Any type of primary headache can return as a rebound headache if a person regularly takes analgesics. The types of primary headaches that are commonly associated with rebound headaches include: Migraines. Headaches that are caused  by tense muscles in the head and neck area (tension headaches). Headaches that develop and happen again on one side of the head and around the eye (cluster headaches). If rebound headaches continue, they can become long-term, daily headaches. What are the causes? This condition may be caused by frequent use of: Over-the-counter medicines such as aspirin, ibuprofen, and acetaminophen. Sinus-relief medicines and medicines that contain caffeine. Narcotic pain medicines such as codeine and oxycodone. Some prescription migraine medicines. What are the signs or symptoms? The symptoms of a rebound headache are the same as the symptoms of the original headache. Some of the symptoms of specific types of headaches include: Migraine headache Pulsing or throbbing pain on one or both sides of the head. Severe pain that interferes with daily activities. Pain that gets worse with physical activity. Nausea, vomiting, or both. Pain and sensitivity with exposure to bright light, loud noises, or strong smells. Visual changes. Numbness of one or both arms. Tension headache Pressure around the head. Dull, aching head pain. Pain felt over the front and sides of the head. Tenderness in the muscles of the head, neck, and shoulders. Cluster headache Severe pain that begins in or around one eye or temple. Droopy or swollen eyelid, or redness and tearing in the eye on the same side as the pain. One-sided head pain. Nausea. Runny nose. Sweaty, pale facial skin. Restlessness. How is this diagnosed? This condition is diagnosed by: Reviewing your medical history. This includes the nature of your primary headaches. Reviewing the types of pain medicines that you have been using to treat your primary headaches and how often you take them. How is this treated? This condition may be treated or managed by: Discontinuing frequent use of the analgesic medicine. Doing this may worsen your headaches at first, but the  pain should eventually become more manageable, less frequent, and less severe. Seeing a headache specialist. He or she may be able to help you manage your headaches and help make sure there is not another cause of the headaches. Using methods of stress relief, such as acupuncture, counseling, biofeedback, and massage. Talk with your health care provider about which methods might be good for you. Follow these instructions at home: Medicines  Take over-the-counter and prescription medicines only as told by your health care provider. Stop the repeated use of pain medicine as told by your health care provider. Stopping can be difficult. Carefully follow instructions from your health care provider. Lifestyle Follow a regular sleep schedule. Do not vary the time that you go to bed or the amount that you sleep from day to day. It is important to stay on the same schedule to help prevent headaches. Get 7-9 hours of sleep each night, or the amount recommended by your health care provider. Exercise regularly. Exercise for at least 30 minutes, 5 times each week. Limit or manage stress. Consider stress-relief options such as acupuncture, counseling, biofeedback, and massage. Talk with your health care provider about which methods might be good for you.  Do not drink alcohol. Do not use any products that contain nicotine or tobacco, such as cigarettes, e-cigarettes, and chewing tobacco. If you need help quitting, ask your health care provider. General instructions Avoid triggers that are known to cause your primary headaches. Keep all follow-up visits as told by your health care provider. This is important. Contact a health care provider if: You continue to experience headaches after following treatments that your health care provider recommended. Get help right away if you have: New headache pain. Headache pain that is different than what you have experienced in the past. Numbness or tingling in your arms  or legs. Changes in your speech or vision. Summary An analgesic rebound headache, sometimes called a medication overuse headache or a drug-induced headache, is caused by the overuse of pain medicine (analgesic) to treat the original (primary) headache. Any type of primary headache can return as a rebound headache if a person regularly takes analgesics. The types of primary headaches that are commonly associated with rebound headaches include migraines, tension headaches, and cluster headaches. Analgesic rebound headaches can occur with frequent use of over-the-counter medicines and prescription medicines. Treatment involves stopping the medicine that is being overused. This will improve headache frequency and severity. This information is not intended to replace advice given to you by your health care provider. Make sure you discuss any questions you have with your health care provider. Document Revised: 04/30/2022 Document Reviewed: 12/14/2019 Elsevier Patient Education  [place]. Sleep Apnea Sleep apnea is a condition in which breathing pauses or becomes shallow during sleep. People with sleep apnea usually snore loudly. They may have times when they gasp and stop breathing for 10 seconds or more during sleep. This may happen many times during the night. Sleep apnea disrupts your sleep and keeps your body from getting the rest that it needs. This condition can increase your risk of certain health problems, including: Heart attack. Stroke. Obesity. Type 2 diabetes. Heart failure. Irregular heartbeat. High blood pressure. The goal of treatment is to help you breathe normally again. What are the causes?  The most common cause of sleep apnea is a collapsed or blocked airway. There are three kinds of sleep apnea: Obstructive sleep apnea. This kind is caused by a blocked or collapsed airway. Central sleep apnea. This kind happens when the part of the brain that controls breathing does  not send the correct signals to the muscles that control breathing. Mixed sleep apnea. This is a combination of obstructive and central sleep apnea. What increases the risk? You are more likely to develop this condition if you: Are overweight. Smoke. Have a smaller than normal airway. Are older. Are female. Drink alcohol. Take sedatives or tranquilizers. Have a family history of sleep apnea. Have a tongue or tonsils that are larger than normal. What are the signs or symptoms? Symptoms of this condition include: Trouble staying asleep. Loud snoring. Morning headaches. Waking up gasping. Dry mouth or sore throat in the morning. Daytime sleepiness and tiredness. If you have daytime fatigue because of sleep apnea, you may be more likely to have: Trouble concentrating. Forgetfulness. Irritability or mood swings. Personality changes. Feelings of depression. Sexual dysfunction. This may include loss of interest if you are female, or erectile dysfunction if you are female. How is this diagnosed? This condition may be diagnosed with: A medical history. A physical exam. A series of tests that are done while you are sleeping (sleep study). These tests are usually done in a sleep lab,  but they may also be done at home. How is this treated? Treatment for this condition aims to restore normal breathing and to ease symptoms during sleep. It may involve managing health issues that can affect breathing, such as high blood pressure or obesity. Treatment may include: Sleeping on your side. Using a decongestant if you have nasal congestion. Avoiding the use of depressants, including alcohol, sedatives, and narcotics. Losing weight if you are overweight. Making changes to your diet. Quitting smoking. Using a device to open your airway while you sleep, such as: An oral appliance. This is a custom-made mouthpiece that shifts your lower jaw forward. A continuous positive airway pressure (CPAP) device.  This device blows air through a mask when you breathe out (exhale). A nasal expiratory positive airway pressure (EPAP) device. This device has valves that you put into each nostril. A bi-level positive airway pressure (BIPAP) device. This device blows air through a mask when you breathe in (inhale) and breathe out (exhale). Having surgery if other treatments do not work. During surgery, excess tissue is removed to create a wider airway. Follow these instructions at home: Lifestyle Make any lifestyle changes that your health care provider recommends. Eat a healthy, well-balanced diet. Take steps to lose weight if you are overweight. Avoid using depressants, including alcohol, sedatives, and narcotics. Do not use any products that contain nicotine or tobacco. These products include cigarettes, chewing tobacco, and vaping devices, such as e-cigarettes. If you need help quitting, ask your health care provider. General instructions Take over-the-counter and prescription medicines only as told by your health care provider. If you were given a device to open your airway while you sleep, use it only as told by your health care provider. If you are having surgery, make sure to tell your health care provider you have sleep apnea. You may need to bring your device with you. Keep all follow-up visits. This is important. Contact a health care provider if: The device that you received to open your airway during sleep is uncomfortable or does not seem to be working. Your symptoms do not improve. Your symptoms get worse. Get help right away if: You develop: Chest pain. Shortness of breath. Discomfort in your back, arms, or stomach. You have: Trouble speaking. Weakness on one side of your body. Drooping in your face. These symptoms may represent a serious problem that is an emergency. Do not wait to see if the symptoms will go away. Get medical help right away. Call your local emergency services (911 in  the [place] Do not drive yourself to the hospital. Summary Sleep apnea is a condition in which breathing pauses or becomes shallow during sleep. The most common cause is a collapsed or blocked airway. The goal of treatment is to restore normal breathing and to ease symptoms during sleep. This information is not intended to replace advice given to you by your health care provider. Make sure you discuss any questions you have with your health care provider. Document Revised: 06/25/2021 Document Reviewed: 10/25/2020 Elsevier Patient Education  [place].  Document Revised: 04/30/2022 Document Reviewed: 12/29/2018 Elsevier Patient Education  [place].

## 2023-01-06 LAB — BASIC METABOLIC PANEL
BUN/Creatinine Ratio: 15 (ref 9–23)
BUN: 13 mg/dL (ref 6–20)
CO2: 24 mmol/L (ref 20–29)
Calcium: 9.7 mg/dL (ref 8.7–10.2)
Chloride: 100 mmol/L (ref 96–106)
Creatinine, Ser: 0.86 mg/dL (ref 0.57–1.00)
Glucose: 75 mg/dL (ref 70–99)
Potassium: 4.7 mmol/L (ref 3.5–5.2)
Sodium: 138 mmol/L (ref 134–144)
eGFR: 88 mL/min/{1.73_m2} (ref >59)

## 2023-01-06 LAB — TSH RFX ON ABNORMAL TO FREE T4: TSH: 1.47 u[IU]/mL (ref 0.450–4.500)

## 2023-01-12 ENCOUNTER — Encounter (INDEPENDENT_AMBULATORY_CARE_PROVIDER_SITE_OTHER): Payer: Self-pay | Admitting: Adult Health

## 2023-01-12 ENCOUNTER — Ambulatory Visit (INDEPENDENT_AMBULATORY_CARE_PROVIDER_SITE_OTHER): Payer: Medicaid Other | Admitting: Adult Health

## 2023-01-12 ENCOUNTER — Other Ambulatory Visit (HOSPITAL_COMMUNITY): Payer: Self-pay

## 2023-01-12 VITALS — BP 122/83 | HR 85 | Temp 98.5°F | Ht 63.0 in | Wt 209.0 lb

## 2023-01-12 DIAGNOSIS — E669 Obesity, unspecified: Secondary | ICD-10-CM | POA: Diagnosis not present

## 2023-01-12 DIAGNOSIS — E559 Vitamin D deficiency, unspecified: Secondary | ICD-10-CM | POA: Diagnosis not present

## 2023-01-12 DIAGNOSIS — R7303 Prediabetes: Secondary | ICD-10-CM

## 2023-01-12 DIAGNOSIS — E88819 Insulin resistance, unspecified: Secondary | ICD-10-CM

## 2023-01-12 DIAGNOSIS — Z6837 Body mass index (BMI) 37.0-37.9, adult: Secondary | ICD-10-CM

## 2023-01-12 DIAGNOSIS — E66813 Obesity, class 3: Secondary | ICD-10-CM

## 2023-01-12 DIAGNOSIS — G43009 Migraine without aura, not intractable, without status migrainosus: Secondary | ICD-10-CM

## 2023-01-12 MED ORDER — VITAMIN D (ERGOCALCIFEROL) 1.25 MG (50000 UNIT) PO CAPS: 50000.0000 [IU] | ORAL_CAPSULE | ORAL | 0 refills | Status: DC

## 2023-01-12 MED ORDER — WEGOVY 0.25 MG/0.5ML ~~LOC~~ SOAJ
0.2500 mg | SUBCUTANEOUS | 0 refills | Status: DC
  Filled 2023-01-12: qty 2, 28d supply, fill #0

## 2023-01-12 NOTE — Progress Notes (Signed)
Chief Complaint:   OBESITY Lauren Garza is here to discuss her progress with her obesity treatment plan along with follow-up of her obesity related diagnoses. Lauren Garza is on the Category 2 Plan and states she is following her eating plan approximately 50% of the time. Lauren Garza states she is not currently exercising.  Today's visit was #: 16 Starting weight: 237 lbs Starting date: 03/19/2020 Today's weight: 209 lbs Today's date: 01/12/2023 Total lbs lost to date: 28 Total lbs lost since last in-office visit: - 4 lbs  Interim History:  Lauren Garza reports that lean protein is often cost prohibitive due to persistent inflation. Her 60 year old son recently moved home and that has also increased overall living expenses.  Subjective:   1. Insulin Resistance Lab Results  Component Value Date   HGBA1C 5.7 (H) 09/17/2022   HGBA1C 5.4 03/18/2022   HGBA1C 5.4 01/14/2022    Her insulin level has fluctuated between 4.9-10.3 She denies family hx of MEN 2 or MTC. She denies personal hx of pancreatitis. She has tolerated oral Rybelsus well in past.  2. Prediabetes Lab Results  Component Value Date   HGBA1C 5.7 (H) 09/17/2022   HGBA1C 5.4 03/18/2022   HGBA1C 5.4 01/14/2022   She denies family hx of MEN 2 or MTC. She denies personal hx of pancreatitis. She has tolerated oral Rybelsus well in past.  3.Vitamin D deficiency She is currently on weekly Ergocalciferol-denies N/V/Muscle Weakness.  4. Migraine without aura, not intractable, without status migrainosus Recently established with Dr. Ahern/Neurology. Started on medication and imaging ordered.  Assessment/Plan:   1.Insulin Resistance Check labs - Hemoglobin A1c - Semaglutide-Weight Management (WEGOVY) 0.25 MG/0.5ML SOAJ; Inject 0.25 mg into the skin once a week.  Dispense: 2 mL; Refill: 0  2. Vitamin D deficiency Check Labs - Vitamin D, Ergocalciferol, (DRISDOL) 1.25 MG (50000 UNIT) CAPS capsule; Take 1 capsule (50,000  Units total) by mouth every 7 (seven) days.  Dispense: 4 capsule; Refill: 0 - VITAMIN D 25 Hydroxy (Vit-D Deficiency, Fractures)  3. Migraine without aura, not intractable, without status migrainosus F/u with Neurology/Lauren Garza as directed.  4. Obesity, Current BMI 37.2  Lauren Garza is currently in the action stage of change. As such, her goal is to continue with weight loss efforts. She has agreed to the Category 2 Plan.   Exercise goals: For substantial health benefits, adults should do at least 150 minutes (2 hours and 30 minutes) a week of moderate-intensity, or 75 minutes (1 hour and 15 minutes) a week of vigorous-intensity aerobic physical activity, or an equivalent combination of moderate- and vigorous-intensity aerobic activity. Aerobic activity should be performed in episodes of at least 10 minutes, and preferably, it should be spread throughout the week.  Behavioral modification strategies: increasing lean protein intake, decreasing simple carbohydrates, increasing vegetables, increasing water intake, decreasing sodium intake, no skipping meals, meal planning and cooking strategies, keeping healthy foods in the home, and planning for success.  Lauren Garza has agreed to follow-up with our clinic in 4 weeks. She was informed of the importance of frequent follow-up visits to maximize her success with intensive lifestyle modifications for her multiple health conditions.   Lauren Garza was informed we would discuss her lab results at her next visit unless there is a critical issue that needs to be addressed sooner. Lauren Garza agreed to keep her next visit at the agreed upon time to discuss these results.  Objective:   Blood pressure 122/83, pulse 85, temperature 98.5 F (36.9 C), height 5'  3" (1.6 m), weight 209 lb (94.8 kg), last menstrual period 02/11/2018, SpO2 100 %. Body mass index is 37.02 kg/m.  General: Cooperative, alert, well developed, in no acute distress. HEENT: Conjunctivae and  lids unremarkable. Cardiovascular: Regular rhythm.  Lungs: Normal work of breathing. Neurologic: No focal deficits.   Lab Results  Component Value Date   CREATININE 0.86 01/05/2023   BUN 13 01/05/2023   NA 138 01/05/2023   K 4.7 01/05/2023   CL 100 01/05/2023   CO2 24 01/05/2023   Lab Results  Component Value Date   ALT 19 09/17/2022   AST 22 09/17/2022   ALKPHOS 107 09/17/2022   BILITOT 0.2 09/17/2022   Lab Results  Component Value Date   HGBA1C 5.7 (H) 09/17/2022   HGBA1C 5.4 03/18/2022   HGBA1C 5.4 01/14/2022   HGBA1C 5.5 07/31/2021   HGBA1C 5.3 01/27/2021   Lab Results  Component Value Date   INSULIN 8.3 09/17/2022   INSULIN 10.3 03/18/2022   INSULIN 4.9 01/14/2022   INSULIN 7.0 07/31/2021   INSULIN 8.1 01/27/2021   Lab Results  Component Value Date   TSH 1.470 01/05/2023   Lab Results  Component Value Date   CHOL 164 09/17/2022   HDL 67 09/17/2022   LDLCALC 86 09/17/2022   TRIG 51 09/17/2022   CHOLHDL 2.4 09/17/2022   Lab Results  Component Value Date   VD25OH 62.6 09/17/2022   VD25OH 48.7 03/18/2022   VD25OH 69.7 01/14/2022   Lab Results  Component Value Date   WBC 9.1 11/07/2020   HGB 13.2 11/07/2020   HCT 41.9 11/07/2020   MCV 84.5 11/07/2020   PLT 311 11/07/2020   No results found for: "IRON", "TIBC", "FERRITIN"  Attestation Statements:   Reviewed by clinician on day of visit: allergies, medications, problem list, medical history, surgical history, family history, social history, and previous encounter notes.   I have reviewed the above documentation for accuracy and completeness, and I agree with the above. -  Lauren Garza d. Cassandria Drew, NP-C

## 2023-01-13 ENCOUNTER — Telehealth: Payer: Self-pay | Admitting: Neurology

## 2023-01-13 LAB — HEMOGLOBIN A1C
Est. average glucose Bld gHb Est-mCnc: 117 mg/dL
Hgb A1c MFr Bld: 5.7 % — ABNORMAL HIGH (ref 4.8–5.6)

## 2023-01-13 LAB — VITAMIN D 25 HYDROXY (VIT D DEFICIENCY, FRACTURES): Vit D, 25-Hydroxy: 59.2 ng/mL (ref 30.0–100.0)

## 2023-01-13 NOTE — Telephone Encounter (Signed)
[employer] medicaid Josem KaufmannVU:[id number] exp. 01/13/23-02/27/23 sent to GI [id number]

## 2023-01-19 ENCOUNTER — Other Ambulatory Visit (HOSPITAL_COMMUNITY): Payer: Self-pay

## 2023-01-21 ENCOUNTER — Telehealth: Payer: Self-pay | Admitting: Physical Medicine and Rehabilitation

## 2023-01-21 DIAGNOSIS — M25552 Pain in left hip: Secondary | ICD-10-CM

## 2023-01-21 NOTE — Telephone Encounter (Signed)
Waiting on call for an appointment.Marland Kitchen

## 2023-01-21 NOTE — Telephone Encounter (Signed)
Spoke with patient and scheduled bilateral greater trochanter injection for 02/01/23

## 2023-01-25 ENCOUNTER — Telehealth: Payer: Self-pay | Admitting: Physical Medicine and Rehabilitation

## 2023-01-25 NOTE — Telephone Encounter (Signed)
Spoke with patient and rescheduled appointment for 01/26/23

## 2023-01-25 NOTE — Telephone Encounter (Signed)
Pt called asking to be added to Dr Ernestina Patches cancellation list. Pt phone number is [phone].

## 2023-01-26 ENCOUNTER — Ambulatory Visit: Payer: Self-pay

## 2023-01-26 ENCOUNTER — Ambulatory Visit (INDEPENDENT_AMBULATORY_CARE_PROVIDER_SITE_OTHER): Payer: Medicaid Other | Admitting: Physical Medicine and Rehabilitation

## 2023-01-26 DIAGNOSIS — M7062 Trochanteric bursitis, left hip: Secondary | ICD-10-CM

## 2023-01-26 DIAGNOSIS — M7061 Trochanteric bursitis, right hip: Secondary | ICD-10-CM

## 2023-01-26 NOTE — Progress Notes (Unsigned)
Functional Pain Scale - descriptive words and definitions   Severe (9)  Cannot do any ADL's even with assistance can barely talk/unable to sleep and unable to use distraction. Severe range order  Average Pain 9   +Driver, -BT, -Dye Allergies.  Bilateral hip pain

## 2023-01-27 ENCOUNTER — Encounter: Payer: Self-pay | Admitting: Neurology

## 2023-01-27 ENCOUNTER — Ambulatory Visit: Payer: Medicaid Other | Admitting: Neurology

## 2023-01-27 VITALS — BP 123/89 | HR 79 | Ht 63.0 in | Wt 209.0 lb

## 2023-01-27 DIAGNOSIS — G43009 Migraine without aura, not intractable, without status migrainosus: Secondary | ICD-10-CM | POA: Diagnosis not present

## 2023-01-27 DIAGNOSIS — H04123 Dry eye syndrome of bilateral lacrimal glands: Secondary | ICD-10-CM

## 2023-01-27 DIAGNOSIS — Z6837 Body mass index (BMI) 37.0-37.9, adult: Secondary | ICD-10-CM

## 2023-01-27 DIAGNOSIS — J4521 Mild intermittent asthma with (acute) exacerbation: Secondary | ICD-10-CM

## 2023-01-27 DIAGNOSIS — G478 Other sleep disorders: Secondary | ICD-10-CM

## 2023-01-27 DIAGNOSIS — G4719 Other hypersomnia: Secondary | ICD-10-CM

## 2023-01-27 DIAGNOSIS — R519 Headache, unspecified: Secondary | ICD-10-CM | POA: Insufficient documentation

## 2023-01-27 DIAGNOSIS — E661 Drug-induced obesity: Secondary | ICD-10-CM

## 2023-01-27 DIAGNOSIS — G43709 Chronic migraine without aura, not intractable, without status migrainosus: Secondary | ICD-10-CM | POA: Insufficient documentation

## 2023-01-27 DIAGNOSIS — R682 Dry mouth, unspecified: Secondary | ICD-10-CM | POA: Diagnosis not present

## 2023-01-27 DIAGNOSIS — Z6832 Body mass index (BMI) 32.0-32.9, adult: Secondary | ICD-10-CM | POA: Insufficient documentation

## 2023-01-27 MED ORDER — TRIAMCINOLONE ACETONIDE 40 MG/ML IJ SUSP
40.0000 mg | INTRAMUSCULAR | Status: AC | PRN
  Administered 2023-01-26: 40 mg via INTRA_ARTICULAR

## 2023-01-27 MED ORDER — BUPIVACAINE HCL 0.5 % IJ SOLN
5.0000 mL | INTRAMUSCULAR | Status: AC | PRN
  Administered 2023-01-26: 5 mL via INTRA_ARTICULAR
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
## 2023-01-27 NOTE — Patient Instructions (Signed)
Healthy Living: Sleep In this video, you will learn why sleep is an important part of a healthy lifestyle. To view the content, go to this web address: [email or web address]  This video will expire on: 11/11/2024. If you need access to this video following this date, please reach out to the healthcare provider who assigned it to you. This information is not intended to replace advice given to you by your health care provider. Make sure you discuss any questions you have with your health care provider. Elsevier Patient Education  [place] Sleep Information, Adult Quality sleep is important for your mental and physical health. It also improves your quality of life. Quality sleep means you: Are asleep for most of the time you are in bed. Fall asleep within 30 minutes. Wake up no more than once a night. Are awake for no longer than 20 minutes if you do wake up during the night. Most adults need 7-8 hours of quality sleep each night. How can poor sleep affect me? If you do not get enough quality sleep, you may have: Mood swings. Daytime sleepiness. Decreased alertness, reaction time, and concentration. Sleep disorders, such as insomnia and sleep apnea. Difficulty with: Solving problems. Coping with stress. Paying attention. These issues may affect your performance and productivity at work, school, and home. Lack of sleep may also put you at higher risk for accidents, suicide, and risky behaviors. If you do not get quality sleep, you may also be at higher risk for several health problems, including: Infections. Type 2 diabetes. Heart disease. High blood pressure. Obesity. Worsening of long-term conditions, like arthritis, kidney disease, depression, Parkinson's disease, and epilepsy. What actions can I take to get more quality sleep? Sleep schedule and routine Stick to a sleep schedule. Go to sleep and wake up at about the same time each day. Do not try to  sleep less on weekdays and make up for lost sleep on weekends. This does not work. Limit naps during the day to 30 minutes or less. Do not take naps in the late afternoon. Make time to relax before bed. Reading, listening to music, or taking a hot bath promotes quality sleep. Make your bedroom a place that promotes quality sleep. Keep your bedroom dark, quiet, and at a comfortable room temperature. Make sure your bed is comfortable. Avoid using electronic devices that give off bright blue light for 30 minutes before bedtime. Your brain perceives bright blue light as sunlight. This includes television, phones, and computers. If you are lying awake in bed for longer than 20 minutes, get up and do a relaxing activity until you feel sleepy. Lifestyle     Try to get at least 30 minutes of exercise on most days. Do not exercise 2-3 hours before going to bed. Do not use any products that contain nicotine or tobacco. These products include cigarettes, chewing tobacco, and vaping devices, such as e-cigarettes. If you need help quitting, ask your health care provider. Do not drink caffeinated beverages for at least 8 hours before going to bed. Coffee, tea, and some sodas contain caffeine. Do not drink alcohol or eat large meals close to bedtime. Try to get at least 30 minutes of sunlight every day. Morning sunlight is best. Medical concerns Work with your health care provider to treat medical conditions that may affect sleeping, such as: Nasal obstruction. Snoring. Sleep apnea and other sleep disorders. Talk to your health care provider if you think any of your prescription medicines may cause you  to have difficulty falling or staying asleep. If you have sleep problems, talk with a sleep consultant. If you think you have a sleep disorder, talk with your health care provider about getting evaluated by a specialist. Where to find more information Sleep Foundation: sleepfoundation.org American Academy of  Sleep Medicine: aasm.org Centers for Disease Control and Prevention (CDC): StoreMirror.com.cy Contact a health care provider if: You have trouble getting to sleep or staying asleep. You often wake up very early in the morning and cannot get back to sleep. You have daytime sleepiness. You have daytime sleep attacks of suddenly falling asleep and sudden muscle weakness (narcolepsy). You have a tingling sensation in your legs with a strong urge to move your legs (restless legs syndrome). You stop breathing briefly during sleep (sleep apnea). You think you have a sleep disorder or are taking a medicine that is affecting your quality of sleep. Summary Most adults need 7-8 hours of quality sleep each night. Getting enough quality sleep is important for your mental and physical health. Make your bedroom a place that promotes quality sleep, and avoid things that may cause you to have poor sleep, such as alcohol, caffeine, smoking, or large meals. Talk to your health care provider if you have trouble falling asleep or staying asleep. This information is not intended to replace advice given to you by your health care provider. Make sure you discuss any questions you have with your health care provider. Document Revised: 03/11/2022 Document Reviewed: 03/11/2022 Elsevier Patient Education  [place].

## 2023-01-27 NOTE — Progress Notes (Signed)
Lauren Garza - 42 y.o. female MRN [id number]  Date of birth: October 11, 1981  Office Visit Note: Visit Date: 01/26/2023 PCP: Lin Landsman, MD Referred by: Lin Landsman, MD  Subjective: Chief Complaint  Patient presents with   Right Hip - Pain   Left Hip - Pain   HPI:  Lauren Garza is a 42 y.o. female who comes in today at the request of Barnet Pall, FNP for planned Bilateral anesthetic greater trochanter injection with fluoroscopic guidance.  The patient has failed conservative care including home exercise, medications, time and activity modification.  This injection will be diagnostic and hopefully therapeutic.  Please see requesting physician notes for further details and justification.    ROS Otherwise per HPI.  Assessment & Plan: Visit Diagnoses:    ICD-10-CM   1. Greater trochanteric bursitis, left  M70.62 XR C-ARM NO REPORT    2. Greater trochanteric bursitis, right  M70.61 XR C-ARM NO REPORT      Plan: No additional findings.   Meds & Orders: No orders of the defined types were placed in this encounter.   Orders Placed This Encounter  Procedures   Large Joint Inj   XR C-ARM NO REPORT    Follow-up: Return for visit to requesting provider as needed.   Procedures: Large Joint Inj: bilateral greater trochanter on 01/26/2023 9:45 AM Indications: pain and diagnostic evaluation Details: 22 G 3.5 in needle, fluoroscopy-guided lateral approach  Arthrogram: No  Medications (Right): 40 mg triamcinolone acetonide 40 MG/ML; 5 mL bupivacaine 0.5 % Medications (Left): 40 mg triamcinolone acetonide 40 MG/ML; 5 mL bupivacaine 0.5 % Outcome: tolerated well, no immediate complications  There was excellent flow of contrast outlined the greater trochanteric bursa without vascular uptake. Procedure, treatment alternatives, risks and benefits explained, specific risks discussed. Consent was given by the patient. Immediately prior to procedure a time out was called to  verify the correct patient, procedure, equipment, support staff and site/side marked as required. Patient was prepped and draped in the usual sterile fashion.          Clinical History: EXAM: MRI LUMBAR SPINE WITHOUT CONTRAST   TECHNIQUE: Multiplanar, multisequence MR imaging of the lumbar spine was performed. No intravenous contrast was administered.   COMPARISON:  None available.   FINDINGS: Segmentation: Standard. Lowest well-formed disc space labeled the L5-S1 level.   Alignment: Mild dextroscoliosis. Alignment otherwise normal preservation of the normal lumbar lordosis.   Vertebrae: Vertebral body height maintained without evidence for acute or chronic fracture. Bone marrow signal intensity within normal limits. No discrete or worrisome osseous lesions. No abnormal marrow edema.   Conus medullaris and cauda equina: Conus extends to the L1 level. Conus and cauda equina appear normal.   Paraspinal and other soft tissues: Paraspinous soft tissues within normal limits. Visualized visceral structures are normal.   Disc levels:   L1-2:  Unremarkable.   L2-3:  Unremarkable.   L3-4: Disc desiccation with minimal annular disc bulge. No stenosis or impingement.   L4-5: Normal interspace. Mild facet hypertrophy. No stenosis or impingement.   L5-S1: Normal interspace. Mild facet hypertrophy. No stenosis or impingement.   IMPRESSION: 1. Mild degenerative disc disease at L3-4 without stenosis or impingement. 2. Mild facet hypertrophy at L4-5 and L5-S1. 3. Underlying mild dextroscoliosis.     Electronically Signed   By: Jeannine Boga M.D.   On: 02/24/2020 22:54     Objective:  VS:  HT:    WT:   BMI:     BP:  HR: bpm  TEMP: ( )  RESP:  Physical Exam   Imaging: XR C-ARM NO REPORT  Result Date: 01/26/2023 Please see Notes tab for imaging impression.

## 2023-02-01 ENCOUNTER — Ambulatory Visit: Payer: Medicaid Other | Admitting: Physical Medicine and Rehabilitation

## 2023-02-01 ENCOUNTER — Telehealth: Payer: Self-pay

## 2023-02-01 ENCOUNTER — Ambulatory Visit
Admission: RE | Admit: 2023-02-01 | Discharge: 2023-02-01 | Disposition: A | Discharge status: Home / Self Care | Payer: Medicaid Other | Source: Ambulatory Visit | Attending: Neurology | Admitting: Neurology

## 2023-02-01 DIAGNOSIS — R519 Headache, unspecified: Secondary | ICD-10-CM

## 2023-02-01 DIAGNOSIS — H539 Unspecified visual disturbance: Secondary | ICD-10-CM

## 2023-02-01 DIAGNOSIS — R51 Headache with orthostatic component, not elsewhere classified: Secondary | ICD-10-CM

## 2023-02-01 DIAGNOSIS — G441 Vascular headache, not elsewhere classified: Secondary | ICD-10-CM

## 2023-02-01 DIAGNOSIS — Z820 Family history of epilepsy and other diseases of the nervous system: Secondary | ICD-10-CM

## 2023-02-01 MED ORDER — GADOPICLENOL 0.5 MMOL/ML IV SOLN
10.0000 mL | Freq: Once | INTRAVENOUS | Status: AC | PRN
  Administered 2023-02-01: 10 mL via INTRAVENOUS

## 2023-02-01 NOTE — Telephone Encounter (Signed)
Patient states she is numbness on the inner part of the right knee. It started on 01/29/23. No issues with walking or balance. Please advise

## 2023-02-02 NOTE — Telephone Encounter (Signed)
Spoke with patient and informed her of the information below. Verbalized understanding

## 2023-02-09 ENCOUNTER — Ambulatory Visit (INDEPENDENT_AMBULATORY_CARE_PROVIDER_SITE_OTHER): Payer: Medicaid Other | Admitting: Adult Health

## 2023-02-16 ENCOUNTER — Telehealth (INDEPENDENT_AMBULATORY_CARE_PROVIDER_SITE_OTHER): Payer: Self-pay | Admitting: Adult Health

## 2023-02-16 ENCOUNTER — Ambulatory Visit (INDEPENDENT_AMBULATORY_CARE_PROVIDER_SITE_OTHER): Payer: Medicaid Other | Admitting: Adult Health

## 2023-02-16 ENCOUNTER — Other Ambulatory Visit (HOSPITAL_COMMUNITY): Payer: Self-pay

## 2023-02-16 ENCOUNTER — Encounter (INDEPENDENT_AMBULATORY_CARE_PROVIDER_SITE_OTHER): Payer: Self-pay | Admitting: Adult Health

## 2023-02-16 VITALS — BP 104/74 | HR 78 | Temp 98.5°F | Ht 63.0 in | Wt 206.0 lb

## 2023-02-16 DIAGNOSIS — E669 Obesity, unspecified: Secondary | ICD-10-CM | POA: Diagnosis not present

## 2023-02-16 DIAGNOSIS — Z6836 Body mass index (BMI) 36.0-36.9, adult: Secondary | ICD-10-CM | POA: Diagnosis not present

## 2023-02-16 DIAGNOSIS — E559 Vitamin D deficiency, unspecified: Secondary | ICD-10-CM

## 2023-02-16 DIAGNOSIS — E88819 Insulin resistance, unspecified: Secondary | ICD-10-CM

## 2023-02-16 MED ORDER — WEGOVY 0.25 MG/0.5ML ~~LOC~~ SOAJ
0.2500 mg | SUBCUTANEOUS | 0 refills | Status: DC
  Filled 2023-02-16: qty 2, fill #0

## 2023-02-16 MED ORDER — VITAMIN D (ERGOCALCIFEROL) 1.25 MG (50000 UNIT) PO CAPS
50000.0000 [IU] | ORAL_CAPSULE | ORAL | 0 refills | Status: DC
  Filled 2023-02-16: qty 4, 28d supply, fill #0

## 2023-02-16 MED ORDER — VITAMIN D (ERGOCALCIFEROL) 1.25 MG (50000 UNIT) PO CAPS: 50000.0000 [IU] | ORAL_CAPSULE | ORAL | 0 refills | Status: DC

## 2023-02-16 NOTE — Progress Notes (Signed)
WEIGHT SUMMARY AND BIOMETRICS  Vitals Temp: 98.5 F (36.9 C) BP: 104/74 Pulse Rate: 78 SpO2: 97 %   Anthropometric Measurements Height: 5\' 3"  (1.6 m) Weight: 206 lb (93.4 kg) BMI (Calculated): 36.5 Weight at Last Visit: 209lb Weight Lost Since Last Visit: 3lb Weight Gained Since Last Visit: 0 Starting Weight: 237lb Total Weight Loss (lbs): 31 lb (14.1 kg)   Body Composition  Body Fat %: 44.8 % Fat Mass (lbs): 92.6 lbs Muscle Mass (lbs): 108.4 lbs Total Body Water (lbs): 79.4 lbs Visceral Fat Rating : 11   Other Clinical Data Fasting: no Labs: No Today's Visit #: 67 Starting Date: 03/19/20    Chief Complaint:   OBESITY Lauren Garza is here to discuss her progress with her obesity treatment plan. She is on the the Category 2 Plan and states she is following her eating plan approximately 55 % of the time.  She states she is exercising gym-treadmill/elliptical/walking 30-37minutes 3 times per week.   Interim History:  Since last OV at [facility] on 01/12/2023 Her 76 year old God Daughter "Lauren Garza" suffered a spinal stroke 01/15/2023 She was on ventilator 31 days then removed- passed away 2023/02/16 Lauren Garza has not eaten since she passed. Her God Daughter had a 61 year old daughter.  She reports strong local support system.  Subjective:   1. Vitamin D deficiency Discussed Labs  Latest Reference Range & Units 01/12/23 10:15  Vitamin D, 25-Hydroxy 30.0 - 100.0 ng/mL 59.2  She is on weekly ergocalciferol- she denies N/V/Muscle Weakness  2. Insulin resistance Discussed Labs  Latest Reference Range & Units 01/12/23 10:15  Hemoglobin A1C 4.8 - 5.6 % 5.7 (H)  Est. average glucose Bld gHb Est-mCnc mg/dL 117  (H): Data is abnormally high [facility] Rx sent in last OV- 01/12/23 Insurance denied- pt provided appeal process information.  Assessment/Plan:   1. Vitamin D deficiency Refill - Vitamin D, Ergocalciferol, (DRISDOL) 1.25 MG (50000 UNIT) CAPS capsule; Take  1 capsule (50,000 Units total) by mouth every 7 (seven) days.  Dispense: 4 capsule; Refill: 0  2. Insulin resistance Appeal insurance denial- information provided to pt.  3. Obesity, Current BMI 36.5  Lauren Garza is currently in the action stage of change. As such, her goal is to continue with weight loss efforts. She has agreed to the Category 2 Plan.   Exercise goals: All adults should avoid inactivity. Some physical activity is better than none, and adults who participate in any amount of physical activity gain some health benefits.  Behavioral modification strategies: increasing lean protein intake, decreasing simple carbohydrates, increasing vegetables, increasing water intake, and planning for success.  Lauren Garza has agreed to follow-up with our clinic in 4 weeks. She was informed of the importance of frequent follow-up visits to maximize her success with intensive lifestyle modifications for her multiple health conditions.   Objective:   Blood pressure 104/74, pulse 78, temperature 98.5 F (36.9 C), height 5\' 3"  (1.6 m), weight 206 lb (93.4 kg), last menstrual period 02/11/2018, SpO2 97 %. Body mass index is 36.49 kg/m.  General: Cooperative, alert, well developed, in no acute distress. HEENT: Conjunctivae and lids unremarkable. Cardiovascular: Regular rhythm.  Lungs: Normal work of breathing. Neurologic: No focal deficits.   Lab Results  Component Value Date   CREATININE 0.86 01/05/2023   BUN 13 01/05/2023   NA 138 01/05/2023   K 4.7 01/05/2023   CL 100 01/05/2023   CO2 24 01/05/2023   Lab Results  Component Value Date  ALT 19 09/17/2022   AST 22 09/17/2022   ALKPHOS 107 09/17/2022   BILITOT 0.2 09/17/2022   Lab Results  Component Value Date   HGBA1C 5.7 (H) 01/12/2023   HGBA1C 5.7 (H) 09/17/2022   HGBA1C 5.4 03/18/2022   HGBA1C 5.4 01/14/2022   HGBA1C 5.5 07/31/2021   Lab Results  Component Value Date   INSULIN 8.3 09/17/2022   INSULIN 10.3 03/18/2022    INSULIN 4.9 01/14/2022   INSULIN 7.0 07/31/2021   INSULIN 8.1 01/27/2021   Lab Results  Component Value Date   TSH 1.470 01/05/2023   Lab Results  Component Value Date   CHOL 164 09/17/2022   HDL 67 09/17/2022   LDLCALC 86 09/17/2022   TRIG 51 09/17/2022   CHOLHDL 2.4 09/17/2022   Lab Results  Component Value Date   VD25OH 59.2 01/12/2023   VD25OH 62.6 09/17/2022   VD25OH 48.7 03/18/2022   Lab Results  Component Value Date   WBC 9.1 11/07/2020   HGB 13.2 11/07/2020   HCT 41.9 11/07/2020   MCV 84.5 11/07/2020   PLT 311 11/07/2020   No results found for: "IRON", "TIBC", "FERRITIN"  Attestation Statements:   Reviewed by clinician on day of visit: allergies, medications, problem list, medical history, surgical history, family history, social history, and previous encounter notes.  I have reviewed the above documentation for accuracy and completeness, and I agree with the above. -  Najeeb Uptain d. Lauren Tech, NP-C

## 2023-02-16 NOTE — Telephone Encounter (Signed)
PA for [facility] was Denied on 12/21/22. Pt will need to complete an appeal. 02/16/23

## 2023-02-22 ENCOUNTER — Telehealth: Payer: Self-pay | Admitting: Neurology

## 2023-02-22 NOTE — Telephone Encounter (Signed)
[facility] [employer] no auth req'd   Patient is scheduled at [facility] for 03/16/23 at 9:30 AM.  Mailed packet to the patient.

## 2023-03-05 ENCOUNTER — Other Ambulatory Visit (INDEPENDENT_AMBULATORY_CARE_PROVIDER_SITE_OTHER): Payer: Self-pay | Admitting: Adult Health

## 2023-03-05 DIAGNOSIS — E559 Vitamin D deficiency, unspecified: Secondary | ICD-10-CM

## 2023-03-16 ENCOUNTER — Ambulatory Visit: Payer: Medicaid Other | Admitting: Neurology

## 2023-03-16 DIAGNOSIS — G43009 Migraine without aura, not intractable, without status migrainosus: Secondary | ICD-10-CM

## 2023-03-16 DIAGNOSIS — G4710 Hypersomnia, unspecified: Secondary | ICD-10-CM | POA: Diagnosis not present

## 2023-03-16 DIAGNOSIS — E661 Drug-induced obesity: Secondary | ICD-10-CM

## 2023-03-16 DIAGNOSIS — G4719 Other hypersomnia: Secondary | ICD-10-CM

## 2023-03-16 DIAGNOSIS — R519 Headache, unspecified: Secondary | ICD-10-CM

## 2023-03-16 DIAGNOSIS — H04123 Dry eye syndrome of bilateral lacrimal glands: Secondary | ICD-10-CM

## 2023-03-16 DIAGNOSIS — G478 Other sleep disorders: Secondary | ICD-10-CM

## 2023-03-18 ENCOUNTER — Encounter (INDEPENDENT_AMBULATORY_CARE_PROVIDER_SITE_OTHER): Payer: Self-pay | Admitting: Adult Health

## 2023-03-18 ENCOUNTER — Ambulatory Visit (INDEPENDENT_AMBULATORY_CARE_PROVIDER_SITE_OTHER): Payer: Medicaid Other | Admitting: Adult Health

## 2023-03-18 VITALS — BP 118/82 | HR 62 | Temp 99.1°F | Ht 63.0 in | Wt 208.0 lb

## 2023-03-18 DIAGNOSIS — Z6836 Body mass index (BMI) 36.0-36.9, adult: Secondary | ICD-10-CM

## 2023-03-18 DIAGNOSIS — J324 Chronic pansinusitis: Secondary | ICD-10-CM | POA: Diagnosis not present

## 2023-03-18 DIAGNOSIS — E669 Obesity, unspecified: Secondary | ICD-10-CM | POA: Diagnosis not present

## 2023-03-18 DIAGNOSIS — E559 Vitamin D deficiency, unspecified: Secondary | ICD-10-CM

## 2023-03-18 MED ORDER — VITAMIN D (ERGOCALCIFEROL) 1.25 MG (50000 UNIT) PO CAPS: 50000.0000 [IU] | ORAL_CAPSULE | ORAL | 0 refills | Status: DC

## 2023-03-18 NOTE — Progress Notes (Addendum)
WEIGHT SUMMARY AND BIOMETRICS  Vitals Temp: 99.1 F (37.3 C) BP: 118/82 Pulse Rate: 62 SpO2: 97 %   Anthropometric Measurements Height: 5\' 3"  (1.6 m) Weight: 208 lb (94.3 kg) BMI (Calculated): 36.85 Weight at Last Visit: 206lb Weight Lost Since Last Visit: 0 Weight Gained Since Last Visit: 2lb Starting Weight: 237lb Total Weight Loss (lbs): 29 lb (13.2 kg)   Body Composition  Body Fat %: 46 % Fat Mass (lbs): 95.6 lbs Muscle Mass (lbs): 106.8 lbs Total Body Water (lbs): 82.6 lbs Visceral Fat Rating : 11   Other Clinical Data Fasting: no Labs: no Today's Visit #: 60 Starting Date: 03/19/20    Chief Complaint:   OBESITY Lauren Garza is here to discuss her progress with her obesity treatment plan. She is on the the Category 2 Plan and states she is following her eating plan approximately 0 % of the time.  She states she is not currently exercising..   Interim History:  She met a wonderful man, Lauren Garza, while visiting her God Daughter during her fatal hospitalization. They began dating February 19, 2023- have been inseperable since then. [name] endorses increased eating out and limited time to exercise. Edson Snowball has offered to go walking and take her for gym workouts.  Ms. Hoelzel has completed appeal process for [facility] PA- she has received decision from appeals process.   Subjective:   1. Vitamin D deficiency  Latest Reference Range & Units 03/18/22 12:20 09/17/22 10:27 01/12/23 10:15  Vitamin D, 25-Hydroxy 30.0 - 100.0 ng/mL 48.7 62.6 59.2  She is on weekly Ergocalciferol- denies N/V/Muscle Weakness She reports stable energy levels.  2. Chronic pansinusitis 02/25/2023 OV with ENT/Dr. Lucinda Dell is a 42 y.o. female who presents as a return patient with a chief complaint of sinus problem. Starting last week, she developed discolored sinus drainage, stuffy nose, and headache. She has been irrigating but has not started antibiotics. Symptoms have  improved somewhat but haven't cleared yet. Her last episode was in the fall and she felt well between.  Informed consent was obtained including a discussion of risks, benefits, and alternatives. The patient's nose was sprayed with a combination of Afrin and Xylocaine. The patient was positioned in a nearly supine position. A 0 telescope was to evaluate the nasal and sinus passages bilaterally. The telescope was removed after use. Findings included postoperative changes consistent with maxillary antrotomies, ethmoidectomies, sphenoidotomies, and frontal recess explorations. Edema is minimal and there is no significant mucus, pus, or crusting. Maxillary and sphenoid ostia are patent. Frontal recesses are patent.  Impression & Plans:  Lauren Garza is a 42 y.o. female with chronic sinusitis.  - The sinuses look quite good on endoscopic exam today. She was reassured. I recommended continued regular saline irrigation. Her inflammatory symptoms may be viral and may improve with some more time. If not, she was prescribed a course of Cipro.   Assessment/Plan:   1. Vitamin D deficiency Refill - Vitamin D, Ergocalciferol, (DRISDOL) 1.25 MG (50000 UNIT) CAPS capsule; Take 1 capsule (50,000 Units total) by mouth every 7 (seven) days.  Dispense: 4 capsule; Refill: 0  2. Chronic pansinusitis  3. Obseity, BMI 36.85  [name] is not currently in the action stage of change. As such, her goal is to get back to weightloss efforts . She has agreed to the Category 2 Plan.   Call Pharmacy and inquire about [facility] Rx  Exercise goals: For substantial health benefits, adults should do at least 150 minutes (2  hours and 30 minutes) a week of moderate-intensity, or 75 minutes (1 hour and 15 minutes) a week of vigorous-intensity aerobic physical activity, or an equivalent combination of moderate- and vigorous-intensity aerobic activity. Aerobic activity should be performed in episodes of at least 10 minutes, and  preferably, it should be spread throughout the week.  Behavioral modification strategies: increasing lean protein intake, decreasing simple carbohydrates, increasing vegetables, increasing water intake, decreasing eating out, no skipping meals, meal planning and cooking strategies, and planning for success.  [name] has agreed to follow-up with our clinic in 4 weeks. She was informed of the importance of frequent follow-up visits to maximize her success with intensive lifestyle modifications for her multiple health conditions.   Objective:   Blood pressure 118/82, pulse 62, temperature 99.1 F (37.3 C), height 5\' 3"  (1.6 m), weight 208 lb (94.3 kg), last menstrual period 02/11/2018, SpO2 97 %. Body mass index is 36.85 kg/m.  General: Cooperative, alert, well developed, in no acute distress. HEENT: Conjunctivae and lids unremarkable. Cardiovascular: Regular rhythm.  Lungs: Normal work of breathing. Neurologic: No focal deficits.   Lab Results  Component Value Date   CREATININE 0.86 01/05/2023   BUN 13 01/05/2023   NA 138 01/05/2023   K 4.7 01/05/2023   CL 100 01/05/2023   CO2 24 01/05/2023   Lab Results  Component Value Date   ALT 19 09/17/2022   AST 22 09/17/2022   ALKPHOS 107 09/17/2022   BILITOT 0.2 09/17/2022   Lab Results  Component Value Date   HGBA1C 5.7 (H) 01/12/2023   HGBA1C 5.7 (H) 09/17/2022   HGBA1C 5.4 03/18/2022   HGBA1C 5.4 01/14/2022   HGBA1C 5.5 07/31/2021   Lab Results  Component Value Date   INSULIN 8.3 09/17/2022   INSULIN 10.3 03/18/2022   INSULIN 4.9 01/14/2022   INSULIN 7.0 07/31/2021   INSULIN 8.1 01/27/2021   Lab Results  Component Value Date   TSH 1.470 01/05/2023   Lab Results  Component Value Date   CHOL 164 09/17/2022   HDL 67 09/17/2022   LDLCALC 86 09/17/2022   TRIG 51 09/17/2022   CHOLHDL 2.4 09/17/2022   Lab Results  Component Value Date   VD25OH 59.2 01/12/2023   VD25OH 62.6 09/17/2022   VD25OH 48.7 03/18/2022    Lab Results  Component Value Date   WBC 9.1 11/07/2020   HGB 13.2 11/07/2020   HCT 41.9 11/07/2020   MCV 84.5 11/07/2020   PLT 311 11/07/2020   No results found for: "IRON", "TIBC", "FERRITIN"  Attestation Statements:   Reviewed by clinician on day of visit: allergies, medications, problem list, medical history, surgical history, family history, social history, and previous encounter notes.  I have reviewed the above documentation for accuracy and completeness, and I agree with the above. -  Anthonella Klausner d. Yolonda Purtle,NP-C

## 2023-03-18 NOTE — Progress Notes (Signed)
[facility] SLEEP TEST REPORT ( by Watch PAT)   STUDY DATE:  03/18/2023 DOB:   Female, 42 y.o., 1981-04-01  MRN: [id number]    ORDERING CLINICIAN: Melvyn Novas, MD  REFERRING CLINICIAN: Naomie Dean, MD    CLINICAL INFORMATION/HISTORY: Lauren Garza is a 42 y.o. female patient who was seen upon referral on 01/27/2023 from Dr. Lucia Gaskins for a sleep evaluation, in the setting of non- restorative sleep, daytime hypersomnia, insomnia with nocturnal coughing and morning headaches.  Here for new concern of headaches-in the  morning upon waking up, with past medical history of : bursitis of both hips, GERD, acute reaction to stress, allergies, chronic sinusitis, transaminitis, insomnia, vitamin B12 deficiency, hyperlipidemia, morbid obesity, asthma, meralgia paresthetica, kidney stones, hysterectomy, generalized headaches.   ENT ruled out sinus component for current headaches. HA are associated with nausea, are pulsating/pounding/throbbing on both temples, and light and sound sensitivity.     Epworth sleepiness score: 14 /24. FSS at 28/ 63 points.   BMI: 37 kg/m   Neck Circumference: 15"   FINDINGS:   Sleep Summary:   Total Recording Time (hours, min): 7 hours 34 minutes      Total Sleep Time (hours, min):    6 hours 42 minutes             Percent REM (%): 31%    Sleep latency was 22 minutes, and REM sleep latency 81 minutes in duration. There were 30 minutes of wakefulness time after sleep onset recorded.  Sleep was not fragmented.                                    Respiratory Indices:   Calculated pAHI (per hour):   2.6/h    , total RDI 4.8/h                      REM pAHI:   7.2/h   REM RDI was 12/h.                                           NREM pAHI: 1.6/h   non-REM RDI was 3.2/h.                         Positional AHI: The patient slept for 314 minutes in supine position, associated with an AHI of 2.6/h and an RDI of 4.5/h.   This was followed by right lateral sleep for about 60 minutes and associated with an AHI of 2.1 and an RDI of 6.2.  Prone sleep was associated with a higher AHI than supine sleep.  Snoring reached a mean volume of 41 dB and was present for 41% of the total sleep time.                                                 Oxygen Saturation Statistics:     O2 Saturation Range (%): Between a nadir at 93 and a maximum of 100% with a mean saturation of 96%.  O2 Saturation (minutes) <89%: 0 minutes          Pulse Rate Statistics:   Pulse Mean (bpm): 75 bpm               Pulse Range:   Between 54 and 127 bpm.              IMPRESSION:  This HST did not confirm the presence of of a clinically significant degree of sleep apnea nor of hypoxia.  Sleep was not fragmented and there was a high percentage of REM sleep recorded, these are criteria for normal sleep architecture.  I cannot correlate the patient's headaches to her sleep breathing or sleep architecture.  Evident is a higher RDI, also known as respiratory disturbance index, then AHI (apnea hypopnea index), indicating that snoring or upper airway resistance may contribute to less restorative or refreshing sleep.  RECOMMENDATION: This patients excessive daytime sleepiness should be further evaluated by narcolepsy HLA testing if she reports vivid dreams, sleep paralysis, hypnopompic hallucinations, or cataplectic spells- and only then possibly followed by a PSG with MSLT.    UARS- upper airway resistance syndrome.Asthma, nasal obstruction and sinus congestion all increase the risk of mouth breathing and UARS.  This condition would not be treated by positive airway pressure ( Insurance coverage is based on AHI alone and this needs to be 5/h or higher)  but by a triad of : #1 avoiding supine sleep position,  #2 weight loss, this has the most beneficial effect on REM sleep dominant apnea/ hypopnea and snoring- and can  reverse your prediabetic state.  #3 keeping the nasal passage open ( nasal spray) and allergy congestion prevention with daily dose of antihistaminic medications during season, and use of nasal strips, snore guard or mandibular advancement device.    INTERPRETING PHYSICIAN:   Melvyn Novas, MD   Medical Director of [facility] at [facility].

## 2023-03-19 NOTE — Procedures (Signed)
[facility] [employer] [facility] [employer] SLEEP TEST REPORT ( by Watch PAT)   STUDY DATE:  03/18/2023 DOB:   Female, 42 y.o., Apr 07, 1981  MRN: [id number]    ORDERING CLINICIAN: Melvyn Novas, MD  REFERRING CLINICIAN: Naomie Dean, MD    CLINICAL INFORMATION/HISTORY: Lauren Garza is a 42 y.o. female patient who was seen upon referral on 01/27/2023 from Dr. Lucia Gaskins for a sleep evaluation, in the setting of non- restorative sleep, daytime hypersomnia, insomnia with nocturnal coughing and morning headaches.  Here for new concern of headaches-in the  morning upon waking up, with past medical history of : bursitis of both hips, GERD, acute reaction to stress, allergies, chronic sinusitis, transaminitis, insomnia, vitamin B12 deficiency, hyperlipidemia, morbid obesity, asthma, meralgia paresthetica, kidney stones, hysterectomy, generalized headaches.   ENT ruled out sinus component for current headaches. HA are associated with nausea, are pulsating/pounding/throbbing on both temples, and light and sound sensitivity.     Epworth sleepiness score: 14 /24. FSS at 28/ 63 points.   BMI: 37 kg/m   Neck Circumference: 15"   FINDINGS:   Sleep Summary:   Total Recording Time (hours, min): 7 hours 34 minutes      Total Sleep Time (hours, min):    6 hours 42 minutes             Percent REM (%): 31%    Sleep latency was 22 minutes, and REM sleep latency 81 minutes in duration. There were 30 minutes of wakefulness time after sleep onset recorded.  Sleep was not fragmented.                                    Respiratory Indices:   Calculated pAHI (per hour):   2.6/h    , total RDI 4.8/h                      REM pAHI:   7.2/h   REM RDI was 12/h.                                           NREM pAHI: 1.6/h   non-REM RDI was 3.2/h.                         Positional AHI: The patient slept for 314 minutes in supine position, associated with an AHI of 2.6/h and an RDI of 4.5/h.  This  was followed by right lateral sleep for about 60 minutes and associated with an AHI of 2.1 and an RDI of 6.2.  Prone sleep was associated with a higher AHI than supine sleep.  Snoring reached a mean volume of 41 dB and was present for 41% of the total sleep time.                                                 Oxygen Saturation Statistics:     O2 Saturation Range (%): Between a nadir at 93 and a maximum of 100% with a mean saturation of 96%.  O2 Saturation (minutes) <89%: 0 minutes          Pulse Rate Statistics:   Pulse Mean (bpm): 75 bpm               Pulse Range:   Between 54 and 127 bpm.              IMPRESSION:  This HST did not confirm the presence of of a clinically significant degree of sleep apnea nor of hypoxia.  Sleep was not fragmented and there was a high percentage of REM sleep recorded, these are criteria for normal sleep architecture.  I cannot correlate the patient's headaches to her sleep breathing or sleep architecture.  Evident is a higher RDI, also known as respiratory disturbance index, then AHI (apnea hypopnea index), indicating that snoring or upper airway resistance may contribute to less restorative or refreshing sleep.  RECOMMENDATION: This patients excessive daytime sleepiness should be further evaluated by narcolepsy HLA testing if she reports vivid dreams, sleep paralysis, hypnopompic hallucinations, or cataplectic spells- and only then possibly followed by a PSG with MSLT.    UARS- upper airway resistance syndrome.Asthma, nasal obstruction and sinus congestion all increase the risk of mouth breathing and UARS.  This condition would not be treated by positive airway pressure ( Insurance coverage is based on AHI alone and this needs to be 5/h or higher)  but by a triad of : #1 avoiding supine sleep position,  #2 weight loss, this has the most beneficial effect on REM sleep dominant apnea/ hypopnea and snoring- and can reverse  your prediabetic state.  #3 keeping the nasal passage open ( nasal spray) and allergy congestion prevention with daily dose of antihistaminic medications during season, and use of nasal strips, snore guard or mandibular advancement device.    INTERPRETING PHYSICIAN:   Melvyn Novas, MD   Medical Director of [facility] at [facility].

## 2023-04-05 ENCOUNTER — Encounter: Payer: Self-pay | Admitting: Neurology

## 2023-04-05 ENCOUNTER — Telehealth: Payer: Self-pay | Admitting: Neurology

## 2023-04-05 ENCOUNTER — Telehealth (INDEPENDENT_AMBULATORY_CARE_PROVIDER_SITE_OTHER): Payer: Medicaid Other | Admitting: Neurology

## 2023-04-05 DIAGNOSIS — G932 Benign intracranial hypertension: Secondary | ICD-10-CM

## 2023-04-05 DIAGNOSIS — G08 Intracranial and intraspinal phlebitis and thrombophlebitis: Secondary | ICD-10-CM

## 2023-04-05 DIAGNOSIS — H9193 Unspecified hearing loss, bilateral: Secondary | ICD-10-CM

## 2023-04-05 DIAGNOSIS — G441 Vascular headache, not elsewhere classified: Secondary | ICD-10-CM | POA: Diagnosis not present

## 2023-04-05 DIAGNOSIS — E236 Other disorders of pituitary gland: Secondary | ICD-10-CM | POA: Diagnosis not present

## 2023-04-05 DIAGNOSIS — H539 Unspecified visual disturbance: Secondary | ICD-10-CM

## 2023-04-05 NOTE — Telephone Encounter (Signed)
3 month video follow up with dr Lucia Gaskins

## 2023-04-05 NOTE — Telephone Encounter (Signed)
Referral sent to [facility]: Phone?: [phone]  Fax: [phone] 

## 2023-04-05 NOTE — Progress Notes (Addendum)
[facility]    Provider:  Dr Lauren Garza Requesting Provider: [name], MD Primary Care Provider:  [name], MD Virtual Visit via Video Note  I connected with Lauren Garza on 04/05/23 at  9:30 AM EDT by a video enabled telemedicine application and verified that I am speaking with the correct person using two identifiers.  Location: Patient: home   Provider: office   I discussed the limitations of evaluation and management by telemedicine and the availability of in person appointments. The patient expressed understanding and agreed to proceed.  History of Present Illness:     I discussed the assessment and treatment plan with the patient. The patient was provided an opportunity to ask questions and all were answered. The patient agreed with the plan and demonstrated an understanding of the instructions.   The patient was advised to call back or seek an in-person evaluation if the symptoms worsen or if the condition fails to improve as anticipated.  I provided over 40 minutes of non-face-to-face time during this encounter.   Lauren Fret, MD  CC: Migraines  Adendum 06/08/2023: I called patient, she did feel that the lumbar puncture helped taking out some CSF and she does feel more pressure in the head, her opening pressure was slightly elevated at 22 but her MRI did show a partially empty sella.  We discussed Topamax and possibly Diamox.  I started her on Topamax we have another follow-up in August.  She also said that her back still hurts from the lumbar puncture and the blood patch, I gave her some Flexeril to take at night, if it still hurting I told her to come into the office in August and not do a video so that I can examine her.  If she does not like Topamax we can try Diamox and may be even later on CGRP such as Ajovy or Emgality.  Apr 05, 2023: Patient is here for follow-up of migraines.  We initially saw her this past February approximately 3  months ago.  We ordered an MRI of the brain and a sleep study.  Sleep study was negative. MRI of the brain showed very mild cerebellar ectopia, but partially empty sella.She goes to the healthy [facility]. We discussed IDIOPATHIC INTRACRANIAL HYPERTENSION, causes, diff between migraines, possibility for vision loss and workup in detail  At last appointment we started her on rizatriptan,-we also discussed for more frequent migraines need a preventative: We discussed Qulipta (Ajovy, emgality, nurtec, ubrelvy) as new meds.  We started her on Preventative: Nortriptyline at bedtime to see if this would help and if not then she could have access to the CGRP class because she would have failed the necessary medications needed today we will see her back.MRI of the brain showed very mild cerebellar ectopia, but partially empty sella.She goes to the healthy weight and wellness [facility]. We discussed IDIOPATHIC INTRACRANIAL HYPERTENSION, causes, diff between migraines, possibility for vision loss and workup in detail and next steps ordered.   She has 3 headache a week, sometimes feel like pressure, blurry vision, she has some ear problems. MRI of the brain partially empty sella. 4 migraines a month and <12 total headache days a month.    Reviewed notes, labs and imaging from outside physicians, which showed: reviewed sleep study: IMPRESSION:  This HST did not confirm the presence of of a clinically significant degree of sleep apnea nor of hypoxia.  Sleep was not fragmented and there was a high percentage  of REM sleep recorded, these are criteria for normal sleep architecture.   MRI brain: IMPRESSION:   The brain appears normal before and after contrast. 3 to 4 mm of cerebellar ectopia, not enough to be considered a Chiari malformation. Partially empty sella turcica.  This is often an incidental finding but could also be seen with elevated intracranial pressure. Remote sinus surgery and current  chronic sphenoid, maxillary and ethmoid sinusitis Normal enhancement pattern. Recent Results (from the past 2160 hour(s))  Hemoglobin A1c     Status: Abnormal   Collection Time: 01/12/23 10:15 AM  Result Value Ref Range   Hgb A1c MFr Bld 5.7 (H) 4.8 - 5.6 %    Comment:          Prediabetes: 5.7 - 6.4          Diabetes: >6.4          Glycemic control for adults with diabetes: <7.0    Est. average glucose Bld gHb Est-mCnc 117 mg/dL  VITAMIN D 25 Hydroxy (Vit-D Deficiency, Fractures)     Status: None   Collection Time: 01/12/23 10:15 AM  Result Value Ref Range   Vit D, 25-Hydroxy 59.2 30.0 - 100.0 ng/mL    Comment: Vitamin D deficiency has been defined by the Institute of Medicine and an Endocrine Society practice guideline as a level of serum 25-OH vitamin D less than 20 ng/mL (1,2). The Endocrine Society went on to further define vitamin D insufficiency as a level between 21 and 29 ng/mL (2). 1. IOM (Institute of Medicine). 2010. Dietary reference    intakes for calcium and D. [place]: The    [employer]. 2. Holick MF, Binkley Fallbrook, Bischoff-Ferrari HA, et al.    Evaluation, treatment, and prevention of vitamin D    deficiency: an Endocrine Society clinical practice    guideline. JCEM. 2011 Jul; 96(7):1911-30.      HPI 01/05/2023: Here for new concern headaches, past medical history bursitis of both hips, GERD, acute reaction to stress, allergies, chronic sinusitis, transaminitis, insomnia, vitamin B12 deficiency, hyperlipidemia, morbid obesity, asthma, meralgia paresthetica, kidney stones, hysterectomy, generalized headaches.   She is here alone. She has had sporadic headaches in the past but never sever headaches, a few months ago, she went and saw Dr. Jenne Garza and he said not sinuses or allergies, nurtec did not help, nausea, on both temples, pulsating/pounding/throbbing, light and sound sensitivity, she layed down in a quiet room that helped, moving made it worse. 2nd  migraines she has ever had. Lasted for days. Nothing helped, waxed and wained for days. She went to the ED, given imitrex, reglan, toradol and took a while but she felt better. Maternal aunt had headaches but had a brain aneurysm, no pulsatile tinnitus. She can wake up with headaches, no known snoring, doesn't feel refereshed, she may doze off sometimes and she can sit at a red light and fall asleep. Blurry vision, worse in the morning and laying down, hurts to move, new headache, moderate to severe. No other focal neurologic deficits, associated symptoms, inciting events or modifiable factors.  Reviewed notes, labs and imaging from outside physicians, which showed:  From a thorough review of records, medications tried that can be used in migraine management include: Tylenol, Benadryl, Decadron, Topamax contraindicated due to kidney stones, propranolol contraindicated due to asthma, blood pressure medications, ibuprofen, ketorolac, lisinopril, Robaxin, Reglan, Zofran, Phenergan, nurtec, propranolol, nortriptyline, rizatriptan, nurtec, ubrelvy   CT head 2023: COMPARISON:  Brain CT 10/16/2010   FINDINGS:  Brain: No evidence of acute infarction, hemorrhage, hydrocephalus, extra-axial collection or mass lesion/mass effect.   Vascular: No hyperdense vessel or unexpected calcification.   Skull: Intact.   Sinuses/Orbits: Post sinus surgery. Mucosal thickening involving the paranasal sinuses. No air-fluid levels. Mastoid air cells are unremarkable.   Other: None   IMPRESSION: 1. No acute intracranial abnormalities.      Latest Ref Rng & Units 11/07/2020    9:15 AM 03/19/2020   11:40 AM 02/10/2018    5:17 AM  CBC  WBC 4.0 - 10.5 K/uL 9.1  9.2  10.7   Hemoglobin 12.0 - 15.0 g/dL 16.1  09.6  9.3   Hematocrit 36.0 - 46.0 % 41.9  40.9  29.3   Platelets 150 - 400 K/uL 311  345  370       Latest Ref Rng & Units 01/05/2023    8:37 AM 09/17/2022   10:27 AM 03/18/2022   12:20 PM  CMP  Glucose 70 -  99 mg/dL 75  82  70   BUN 6 - 20 mg/dL 13  11  15    Creatinine 0.57 - 1.00 mg/dL 0.45  4.09  8.11   Sodium 134 - 144 mmol/L 138  142  138   Potassium 3.5 - 5.2 mmol/L 4.7  4.8  4.2   Chloride 96 - 106 mmol/L 100  103  100   CO2 20 - 29 mmol/L 24  26  24    Calcium 8.7 - 10.2 mg/dL 9.7  9.0  9.3   Total Protein 6.0 - 8.5 g/dL  7.0  6.9   Total Bilirubin 0.0 - 1.2 mg/dL  0.2  0.6   Alkaline Phos 44 - 121 IU/L  107  101   AST 0 - 40 IU/L  22  22   ALT 0 - 32 IU/L  19  23      CC:  Thigh pain  HPI 01/16/2020:  Lauren Garza is a 42 y.o. female here as requested by Lauren Able, MD for pain in the right thigh, merlgi paresthetica, severe obesity BMI of 40-44. PMHx plantar fasciitis, hypertension, frequent headaches, asthma, kidney stones, hysterectomy.  I reviewed Tirraney Osborne's notes, patient came in for pain in her thighs, bilateral thigh pain, been ongoing at least several months but stable, thighs are very tender to touch, she has not seen PT or chiropractor, she is obese and asked for phentermine, thigh pain positive for numbness, on examination tenderness to palpation about her thighs bilaterally, she was given a shot of Depo-Medrol, she was started on gabapentin, suspicion for meralgia paresthetica, weight loss was recommended for her severe obesity class III.  Symptoms started years ago with numbness in the outer thighs. No inciting events, no accidents or trauma or anything that preceded. In October/Novemebr of last year she was seen for plantar fasciitis and she was walking differently and the pain in the thigh worsened it is burning, throbbing, severe, radiating, in the lateral thigh area, doesn't move, doesn't radiate past the knees, no low back pain associated, symmetric and bilateral, rolling over in bed makes it worse. tanding for long periods of time make it worse. Sitting makes it worse. Can be severe. Also buttocks pain. She went to physical therapy and it did not help. She  has had insurance issues. Gabapentin doesn't help. This is everyday, severe, burning, nothing helps, comes and goes all day. She is going to the bathroom more, feels stream is weaker. No other focal neurologic deficits, associated symptoms,  inciting events or modifiable factors.No abdominal pain.   Reviewed notes, labs and imaging from outside physicians, which showed:  CT Abdomen Pelvis 02/06/2018 reviewed report: 1. Status post hysterectomy. Edema and ill-defined complex fluid within the pelvis, likely due to postoperative chronic hemorrhage/hematoma. No drainable abscess. 2. No bowel obstruction or other acute complication. 3.  Possible constipation. 4. Mild bladder wall thickening could represent cystitis. 5. Left nephrolithiasis.    Review of Systems: Patient complains of symptoms per HPI as well as the following symptoms: weight gain. Pertinent negatives and positives per HPI. All others negative.   Social History   Socioeconomic History   Marital status: Divorced    Spouse name: Not on file   Number of children: 3   Years of education: Not on file   Highest education level: 11th grade  Occupational History   Occupation: Chief Technology Officer  Tobacco Use   Smoking status: Former   Smokeless tobacco: Never   Tobacco comments:    rare smoker 9 years ago  Advertising account planner   Vaping Use: Never used  Substance and Sexual Activity   Alcohol use: Yes    Comment: Rare, 1x year   Drug use: No   Sexual activity: Yes    Birth control/protection: Surgical  Other Topics Concern   Not on file  Social History Narrative   Lives at home with her children and parents    Right handed   Caffeine: coffee, 1 cup/day   Social Determinants of Corporate investment banker Strain: Not on file  Food Insecurity: Not on file  Transportation Needs: Not on file  Physical Activity: Not on file  Stress: Not on file  Social Connections: Not on file  Intimate Partner Violence: Not on file    Family  History  Problem Relation Age of Onset   Hypertension Mother    Diabetes Mother    Other Mother        questionable nerve issue, has numbness in thighs too   Sleep apnea Mother    Obesity Mother    Heart attack Father    Headache Paternal Aunt    Diabetes Maternal Grandmother     Past Medical History:  Diagnosis Date   Abnormal Pap smear    Acid reflux    Allergies    Asthma    Bacterial vaginosis    Bursitis    Chlamydia trachomatis infection 2012   Constipation    Depression    DUB (dysfunctional uterine bleeding)    Family history of adverse reaction to anesthesia    mom has n and V past surgery   Frequent headaches    migraines   Hypertension    "not anymore", taken off BP meds per pt report   Meralgia paresthetica    Plantar fasciitis    right     Patient Active Problem List   Diagnosis Date Noted   Class 2 drug-induced obesity with serious comorbidity and body mass index (BMI) of 37.0 to 37.9 in adult 01/27/2023   Dry mouth and eyes 01/27/2023   Non-restorative sleep 01/27/2023   Migraine without aura and without status migrainosus, not intractable 01/27/2023   Morning headache 01/27/2023   Excessive daytime sleepiness 01/27/2023   Hematochezia 10/05/2022   Greater trochanteric bursitis of both hips 07/16/2022   Gastroesophageal reflux disease 07/16/2022   Acute reaction to stress 06/19/2022   Environmental and seasonal allergies 09/15/2021   At risk for side effect of medication 04/01/2021   Chronic sinusitis 02/25/2021  Transaminitis 02/11/2021   History of tooth extraction 12/23/2020   Insomnia 12/23/2020   Hemorrhagic ovarian cyst left 11/12/2020   SOB (shortness of breath) on exertion 09/26/2020   Vitamin B12 deficiency 08/19/2020   Other hyperlipidemia, pure 08/19/2020   Vitamin D deficiency 05/21/2020   Asthma 05/21/2020   Insulin resistance 05/06/2020   Meralgia paresthetica of both lower extremities 01/16/2020   Class 3 severe obesity with  serious comorbidity and body mass index (BMI) of 40.0 to 44.9 in adult [facility]) 01/16/2020   Abnormal laboratory test 03/09/2018   Kidney stone on left side 02/06/2018   S/P vaginal hysterectomy 02/01/2018   Generalized headaches 10/25/2017    Past Surgical History:  Procedure Laterality Date   GYNECOLOGIC CRYOSURGERY     I & D EXTREMITY Right 04/04/2015   Procedure: IRRIGATION AND DEBRIDEMENT RIGHT WRIST;  Surgeon: Betha Loa, MD;  Location: [facility] OR;  Service: Orthopedics;  Laterality: Right;   SINUS ENDO WITH FUSION N/A 01/25/2017   Procedure: SINUS ENDO WITH FUSION;  Surgeon: Christia Reading, MD;  Location: [facility];  Service: ENT;  Laterality: N/A;   TUBAL LIGATION     VAGINAL HYSTERECTOMY Bilateral 02/01/2018   Procedure: HYSTERECTOMY VAGINAL WITH SALPINGECTOMY;  Surgeon: Hermina Staggers, MD;  Location: [facility];  Service: Gynecology;  Laterality: Bilateral;   WISDOM TOOTH EXTRACTION     WRIST SURGERY      Current Outpatient Medications  Medication Sig Dispense Refill   albuterol (ACCUNEB) 0.63 MG/3ML nebulizer solution Take by nebulization.     Boric Acid Vaginal 600 MG SUPP INSERT 1 SUPPOSITORY VAGINALLY AT BEDTIME. FOR VAGINAL USE     Cholecalciferol (VITAMIN D HIGH POTENCY PO) Take 5,000 Units by mouth every other day.     cyanocobalamin (,VITAMIN B-12,) 1000 MCG/ML injection Inject 1,000 mcg into the skin every 30 (thirty) days.     fluconazole (DIFLUCAN) 150 MG tablet Take 150 mg by mouth daily.     levocetirizine (XYZAL) 5 MG tablet Take 1 tablet (5 mg total) by mouth every evening. 90 tablet 0   montelukast (SINGULAIR) 10 MG tablet SMARTSIG:1 Tablet(s) By Mouth Every Evening     nortriptyline (PAMELOR) 10 MG capsule Take 1 capsule (10 mg total) by mouth at bedtime. 30 capsule 3   omeprazole (PRILOSEC) 20 MG capsule 1 po qd or BID 60 capsule 5   ondansetron (ZOFRAN-ODT) 4 MG disintegrating tablet Take 1-2 tablets (4-8 mg total) by mouth every 8 (eight) hours as needed.  30 tablet 3   rizatriptan (MAXALT-MLT) 10 MG disintegrating tablet Take 1 tablet (10 mg total) by mouth as needed for migraine. May repeat in 2 hours if needed 9 tablet 11   spironolactone (ALDACTONE) 50 MG tablet Take by mouth.     SYMBICORT 80-4.5 MCG/ACT inhaler SMARTSIG:2 Puff(s) By Mouth Twice Daily     Ubrogepant (UBRELVY) 100 MG TABS Take 1 tablet (100 mg total) by mouth every 2 (two) hours as needed. Maximum 200mg  a day. 4 tablet 0   valACYclovir (VALTREX) 1000 MG tablet Take 1,000 mg by mouth 3 (three) times daily.     Vitamin D, Ergocalciferol, (DRISDOL) 1.25 MG (50000 UNIT) CAPS capsule Take 1 capsule (50,000 Units total) by mouth every 7 (seven) days. 4 capsule 0   No current facility-administered medications for this visit.    Allergies as of 04/05/2023 - Review Complete 03/18/2023  Allergen Reaction Noted   Ceftin [cefuroxime]  05/01/2021   Clindamycin/lincomycin Hives 08/15/2011   Dust mite  extract  01/26/2018   Flagyl [metronidazole hcl] Hives 08/15/2011   Lincomycin hcl Hives 08/15/2011   Metformin and related  04/01/2021    Vitals: LMP 02/11/2018 (Exact Date)  Last Weight:  Wt Readings from Last 1 Encounters:  03/18/23 208 lb (94.3 kg)   Last Height:   Ht Readings from Last 1 Encounters:  03/18/23 5\' 3"  (1.6 m)     Physical exam: Exam: Gen: NAD, conversant      CV: attempted, Could not perform over Web Video. Denies palpitations or chest pain or SOB. VS: Breathing at a normal rate. Weight appears obese. Not febrile. Eyes: Conjunctivae clear without exudates or hemorrhage  Neuro: Detailed Neurologic Exam  Speech:    Speech is normal; fluent and spontaneous with normal comprehension.  Cognition:    The patient is oriented to person, place, and time;     recent and remote memory intact;     language fluent;     normal attention, concentration,     fund of knowledge Cranial Nerves:    The pupils are equal, round, and reactive to light. Cannot perform  fundoscopic exam. Visual fields are full to finger confrontation. Extraocular movements are intact.  The face is symmetric with normal sensation. The palate elevates in the midline. Hearing intact. Voice is normal. Shoulder shrug is normal. The tongue has normal motion without fasciculations.   Coordination:    Normal finger to nose  Gait:    Normal native gait  Motor Observation:   no involuntary movements noted. Tone:    Appears normal  Posture:    Posture is normal. normal erect    Strength:    Strength is anti-gravity and symmetric in the upper and lower limbs.      Sensation: intact to LT     Assessment/Plan: 42 year old with headaches, MRI wit partially empty sella and cerebellar ectopia will evaluate for IDIOPATHIC INTRACRANIAL HYPERTENSION and then touch base on treatment based on results  Adendum 06/08/2023: I called patient, she did feel that the lumbar puncture helped taking out some CSF and she does feel more pressure in the head, her opening pressure was slightly elevated at 22 but her MRI did show a partially empty sella.  We discussed Topamax and possibly Diamox.  I started her on Topamax we have another follow-up in August.  She also said that her back still hurts from the lumbar puncture and the blood patch, I gave her some Flexeril to take at night, if it still hurting I told her to come into the office in August and not do a video so that I can examine her.  If she does not like Topamax we can try Diamox and may be even later on CGRP such as Ajovy or Emgality.    Lumbar puncture for opening pressure and labs.   Weight loss is critical, discussed weight loss and risk of permanent vision loss    CTV for venous thrombosis or stenosis  Ophthalmology  Orders Placed This Encounter  Procedures   DG FLUORO GUIDED LOC OF NEEDLE/CATH TIP FOR SPINAL INJECT LT   CT VENOGRAM HEAD   Ambulatory referral to Ophthalmology      For any acute change especially worsening  headache or vision loss call 911 and proceed to ED  To prevent or relieve headaches, try the following: Cool Compress. Lie down and place a cool compress on your head.  Avoid headache triggers. If certain foods or odors seem to have triggered your migraines  in the past, avoid them. A headache diary might help you identify triggers.  Include physical activity in your daily routine. Try a daily walk or other moderate aerobic exercise.  Manage stress. Find healthy ways to cope with the stressors, such as delegating tasks on your to-do list.  Practice relaxation techniques. Try deep breathing, yoga, massage and visualization.  Eat regularly. Eating regularly scheduled meals and maintaining a healthy diet might help prevent headaches. Also, drink plenty of fluids.  Follow a regular sleep schedule. Sleep deprivation might contribute to headaches Consider biofeedback. With this mind-body technique, you learn to control certain bodily functions -- such as muscle tension, heart rate and blood pressure -- to prevent headaches or reduce headache pain.    Proceed to emergency room if you experience new or worsening symptoms or symptoms do not resolve, if you have new neurologic symptoms or if headache is severe, or for any concerning symptom.   Provided education and documentation from American headache Society toolbox including articles on: pseudotumoer cerebri(IIH), chronic migraine medication overuse headache, chronic migraines, prevention of migraines and other headaches, behavioral and other nonpharmacologic treatments for headache.   Cc: Lauren Able, MD,  Lauren Able, MD  Naomie Dean, MD  [facility] [address] [place], [place] [address]  Phone [phone] Fax [phone]

## 2023-04-05 NOTE — Patient Instructions (Signed)
CT veins head Lumbar puncture/spinal tap Ophthalmology appointment  Idiopathic Intracranial Hypertension  Idiopathic intracranial hypertension (IIH) is a condition that increases pressure around the brain. The fluid that surrounds the brain and spinal cord (cerebrospinal fluid, or CSF) increases and causes the pressure. Idiopathic means that the cause of this condition is not known. IIH affects the brain and spinal cord. If this condition is not treated, it can cause vision loss or blindness. What are the causes? The cause of this condition is not known. What increases the risk? The following factors may make you more likely to develop this condition: Being obese. Being a person who is female, between the ages of 59 and 38 years old, and who has not gone through menopause. Taking certain medicines, such as birth control, acne medicines, or steroids. What are the signs or symptoms? Symptoms of this condition include: Headaches. This is the most common symptom. Brief periods of total blindness. Double vision, blurred vision, or poor side (peripheral) vision. Pain in the shoulders or neck. Nausea and vomiting. A sound like rushing water or a pulsing sound within the ears (pulsatile tinnitus), or ringing in the ears. How is this diagnosed? This condition may be diagnosed based on: Your symptoms and medical history. Imaging tests of the brain, such as: CT scan. MRI. Magnetic resonance venogram (MRV) to check the veins. Diagnostic lumbar puncture. This is a procedure to remove and examine a sample of CSF. This procedure can determine whether your fluid pressure is too high. An eye exam to check for swelling or nerve damage in the eyes. How is this treated? Treatment for this condition depends on the symptoms. The goal of treatment is to decrease the pressure around your brain. Common treatments include: Weight loss through healthy eating, salt restriction, and exercise, if you are  overweight. Medicines to decrease the production of CSF and lower the pressure within your skull. Medicines to prevent or treat headaches. Other treatments may include: Surgery to place drains (shunts) in your brain to remove extra fluid. Lumbar puncture to remove extra CSF. Follow these instructions at home: If you are overweight or obese, work with your health care provider to lose weight. Take over-the-counter and prescription medicines only as told by your health care provider. Ask your health care provider if the medicine prescribed to you requires you to avoid driving or using machinery. Do not use any products that contain nicotine or tobacco. These products include cigarettes, chewing tobacco, and vaping devices, such as e-cigarettes. If you need help quitting, ask your health care provider. Keep all follow-up visits. Your health care provider will need to monitor you regularly. Contact a health care provider if: You have changes in your vision, such as: Double vision. Blurred vision. Poor peripheral vision. Get help right away if: You have any of the following symptoms and they get worse or do not get better: Headaches. Nausea. Vomiting. Sudden trouble seeing. This information is not intended to replace advice given to you by your health care provider. Make sure you discuss any questions you have with your health care provider. Document Revised: 04/14/2022 Document Reviewed: 03/24/2022 Elsevier Patient Education  2023 [employer].

## 2023-04-05 NOTE — Telephone Encounter (Signed)
Sent mychart msg informing pt of follow up made with Dr. Ahern 

## 2023-04-06 ENCOUNTER — Telehealth: Payer: Self-pay | Admitting: Neurology

## 2023-04-06 NOTE — Telephone Encounter (Signed)
[employer] medicaid Berkley Harvey: [id number] exp. 04/06/23-05/21/23 sent to GI [phone]

## 2023-04-12 ENCOUNTER — Encounter (INDEPENDENT_AMBULATORY_CARE_PROVIDER_SITE_OTHER): Payer: Self-pay | Admitting: Adult Health

## 2023-04-12 ENCOUNTER — Ambulatory Visit (INDEPENDENT_AMBULATORY_CARE_PROVIDER_SITE_OTHER): Payer: Medicaid Other | Admitting: Adult Health

## 2023-04-12 VITALS — BP 107/72 | HR 65 | Temp 98.2°F | Ht 63.0 in | Wt 211.0 lb

## 2023-04-12 DIAGNOSIS — Z6837 Body mass index (BMI) 37.0-37.9, adult: Secondary | ICD-10-CM | POA: Diagnosis not present

## 2023-04-12 DIAGNOSIS — E559 Vitamin D deficiency, unspecified: Secondary | ICD-10-CM | POA: Diagnosis not present

## 2023-04-12 DIAGNOSIS — R7303 Prediabetes: Secondary | ICD-10-CM | POA: Diagnosis not present

## 2023-04-12 DIAGNOSIS — E669 Obesity, unspecified: Secondary | ICD-10-CM

## 2023-04-12 MED ORDER — VITAMIN D (ERGOCALCIFEROL) 1.25 MG (50000 UNIT) PO CAPS: 50000.0000 [IU] | ORAL_CAPSULE | ORAL | 0 refills | Status: DC

## 2023-04-12 NOTE — Progress Notes (Addendum)
WEIGHT SUMMARY AND BIOMETRICS  Vitals Temp: 98.2 F (36.8 C) BP: 107/72 Pulse Rate: 65 SpO2: 100 %   Anthropometric Measurements Height: 5\' 3"  (1.6 m) Weight: 211 lb (95.7 kg) BMI (Calculated): 37.39 Weight at Last Visit: 208lb Weight Lost Since Last Visit: 0 Weight Gained Since Last Visit: 3lb Starting Weight: 237lb Total Weight Loss (lbs): 26 lb (11.8 kg)   Body Composition  Body Fat %: 45.9 % Fat Mass (lbs): 97.2 lbs Muscle Mass (lbs): 108.8 lbs Total Body Water (lbs): 83 lbs Visceral Fat Rating : 11   Other Clinical Data Fasting: no Labs: no Today's Visit #: 14 Starting Date: 03/19/20    Chief Complaint:   OBESITY Lauren Garza is here to discuss her progress with her obesity treatment plan. She is on the the Category 2 Plan and states she is following her eating plan approximately 0 % of the time.  She states she is not currently exercising- however did walk 5 hrs at [facility].   Interim History:  Lauren Garza states "I eat everything I see".  Hunger/appetite-She endorses persistent polyphagia Sleep- Last night she slept >7 hrs, unsure of nightly average. Exercise-She has not been exercising regularly. Hydration-Estimates to drink 20 oz water/day  Subjective:   1. Prediabetes Lab Results  Component Value Date   HGBA1C 5.7 (H) 01/12/2023   HGBA1C 5.7 (H) 09/17/2022   HGBA1C 5.4 03/18/2022   Per pt- Rybelsus caused SIG fatigue Per pt- Metformin caused increase in severity and frequency of yeast infections She tolerated Ozempic well- then insurance changed and unable to obtain   2. Vitamin D deficiency  Latest Reference Range & Units 01/14/22 10:46 03/18/22 12:20 09/17/22 10:27 01/12/23 10:15  Vitamin D, 25-Hydroxy 30.0 - 100.0 ng/mL 69.7 48.7 62.6 59.2   She is on weekly Ergocalciferol- denies N/V/Muscle Weakness  Assessment/Plan:   1. Prediabetes Increase protein, limit sugar/CHO intake Increase water intake  2. Vitamin D  deficiency Refill - Vitamin D, Ergocalciferol, (DRISDOL) 1.25 MG (50000 UNIT) CAPS capsule; Take 1 capsule (50,000 Units total) by mouth every 7 (seven) days.  Dispense: 4 capsule; Refill: 0  3. Obesity current BMI-37.39  Lauren Garza is currently in the action stage of change. As such, her goal is to continue with weight loss efforts. She has agreed to LOW CHO MEAL PLAN  Exercise goals: Walk 2 x week- at brisk pace.  1 x strength training   Behavioral modification strategies: increasing lean protein intake, decreasing simple carbohydrates, increasing vegetables, increasing water intake, meal planning and cooking strategies, and planning for success.  Lauren Garza has agreed to follow-up with our clinic in 4 weeks. She was informed of the importance of frequent follow-up visits to maximize her success with intensive lifestyle modifications for her multiple health conditions.   Fasting Labs at next OV  Objective:   Blood pressure 107/72, pulse 65, temperature 98.2 F (36.8 C), height 5\' 3"  (1.6 m), weight 211 lb (95.7 kg), last menstrual period 02/11/2018, SpO2 100 %. Body mass index is 37.38 kg/m.  General: Cooperative, alert, well developed, in no acute distress. HEENT: Conjunctivae and lids unremarkable. Cardiovascular: Regular rhythm.  Lungs: Normal work of breathing. Neurologic: No focal deficits.   Lab Results  Component Value Date   CREATININE 0.86 01/05/2023   BUN 13 01/05/2023   NA 138 01/05/2023   K 4.7 01/05/2023   CL 100 01/05/2023   CO2 24 01/05/2023   Lab Results  Component Value Date   ALT 19 09/17/2022  AST 22 09/17/2022   ALKPHOS 107 09/17/2022   BILITOT 0.2 09/17/2022   Lab Results  Component Value Date   HGBA1C 5.7 (H) 01/12/2023   HGBA1C 5.7 (H) 09/17/2022   HGBA1C 5.4 03/18/2022   HGBA1C 5.4 01/14/2022   HGBA1C 5.5 07/31/2021   Lab Results  Component Value Date   INSULIN 8.3 09/17/2022   INSULIN 10.3 03/18/2022   INSULIN 4.9 01/14/2022    INSULIN 7.0 07/31/2021   INSULIN 8.1 01/27/2021   Lab Results  Component Value Date   TSH 1.470 01/05/2023   Lab Results  Component Value Date   CHOL 164 09/17/2022   HDL 67 09/17/2022   LDLCALC 86 09/17/2022   TRIG 51 09/17/2022   CHOLHDL 2.4 09/17/2022   Lab Results  Component Value Date   VD25OH 59.2 01/12/2023   VD25OH 62.6 09/17/2022   VD25OH 48.7 03/18/2022   Lab Results  Component Value Date   WBC 9.1 11/07/2020   HGB 13.2 11/07/2020   HCT 41.9 11/07/2020   MCV 84.5 11/07/2020   PLT 311 11/07/2020   No results found for: "IRON", "TIBC", "FERRITIN"  Attestation Statements:   Reviewed by clinician on day of visit: allergies, medications, problem list, medical history, surgical history, family history, social history, and previous encounter notes.  I have reviewed the above documentation for accuracy and completeness, and I agree with the above. -  Marlo Goodrich d. [name] Sonier, NP-C

## 2023-04-16 ENCOUNTER — Other Ambulatory Visit (HOSPITAL_COMMUNITY): Payer: Self-pay

## 2023-05-10 ENCOUNTER — Ambulatory Visit
Admission: RE | Admit: 2023-05-10 | Discharge: 2023-05-10 | Disposition: A | Discharge status: Home / Self Care | Payer: Medicaid Other | Source: Ambulatory Visit | Attending: Neurology | Admitting: Neurology

## 2023-05-10 ENCOUNTER — Encounter (INDEPENDENT_AMBULATORY_CARE_PROVIDER_SITE_OTHER): Payer: Self-pay | Admitting: Adult Health

## 2023-05-10 ENCOUNTER — Ambulatory Visit (INDEPENDENT_AMBULATORY_CARE_PROVIDER_SITE_OTHER): Payer: Medicaid Other | Admitting: Adult Health

## 2023-05-10 VITALS — BP 129/87 | HR 71 | Temp 98.2°F | Ht 63.0 in | Wt 215.0 lb

## 2023-05-10 DIAGNOSIS — G441 Vascular headache, not elsewhere classified: Secondary | ICD-10-CM

## 2023-05-10 DIAGNOSIS — R7401 Elevation of levels of liver transaminase levels: Secondary | ICD-10-CM | POA: Diagnosis not present

## 2023-05-10 DIAGNOSIS — H539 Unspecified visual disturbance: Secondary | ICD-10-CM

## 2023-05-10 DIAGNOSIS — Z6838 Body mass index (BMI) 38.0-38.9, adult: Secondary | ICD-10-CM

## 2023-05-10 DIAGNOSIS — G08 Intracranial and intraspinal phlebitis and thrombophlebitis: Secondary | ICD-10-CM

## 2023-05-10 DIAGNOSIS — R7303 Prediabetes: Secondary | ICD-10-CM | POA: Diagnosis not present

## 2023-05-10 DIAGNOSIS — H9193 Unspecified hearing loss, bilateral: Secondary | ICD-10-CM

## 2023-05-10 DIAGNOSIS — E559 Vitamin D deficiency, unspecified: Secondary | ICD-10-CM | POA: Diagnosis not present

## 2023-05-10 DIAGNOSIS — E236 Other disorders of pituitary gland: Secondary | ICD-10-CM

## 2023-05-10 DIAGNOSIS — E669 Obesity, unspecified: Secondary | ICD-10-CM | POA: Diagnosis not present

## 2023-05-10 DIAGNOSIS — G932 Benign intracranial hypertension: Secondary | ICD-10-CM

## 2023-05-10 MED ORDER — IOPAMIDOL (ISOVUE-300) INJECTION 61%
75.0000 mL | Freq: Once | INTRAVENOUS | Status: AC | PRN
  Administered 2023-05-10: 75 mL via INTRAVENOUS

## 2023-05-10 MED ORDER — VITAMIN D (ERGOCALCIFEROL) 1.25 MG (50000 UNIT) PO CAPS
50000.0000 [IU] | ORAL_CAPSULE | ORAL | 0 refills | Status: DC
Start: 2023-05-10 — End: 2023-06-15

## 2023-05-10 NOTE — Progress Notes (Signed)
WEIGHT SUMMARY AND BIOMETRICS  Vitals Temp: 98.2 F (36.8 C) BP: 129/87 Pulse Rate: 71 SpO2: 100 %   Anthropometric Measurements Height: 5\' 3"  (1.6 m) Weight: 215 lb (97.5 kg) BMI (Calculated): 38.09 Weight at Last Visit: 211 lb Weight Lost Since Last Visit: 0 Weight Gained Since Last Visit: 4 lb Starting Weight: 237 lb Total Weight Loss (lbs): 22 lb (9.979 kg)   Body Composition  Body Fat %: 44.8 % Fat Mass (lbs): 96.4 lbs Muscle Mass (lbs): 112.8 lbs Total Body Water (lbs): 86 lbs Visceral Fat Rating : 11   Other Clinical Data Fasting: yes Labs: no Today's Visit #: 86 Starting Date: 03/19/20    Chief Complaint:   OBESITY Lauren Garza is here to discuss her progress with her obesity treatment plan. She is on the the Category 2 Plan and states she is following her eating plan approximately 0 % of the time. She states she is exercising -Walked once for 21 mins.   Interim History:  She is unable to afford foods on prescribed meal plan.  She "eats what I can afford". Discussed eating plan options that may fit her budget better.  Sleep- she estimates to sleep 6.5-7hrs/night. She endorses persistent fatigue.  Exercise-she has walked once with her boyfriend since last OV.\  Hydration-she has 40 oz tumbler- she tries to drink 2 x tumblers/day  Reviewed Bioempedence Results pt: Muscle Mass: + 4 lbs Adipose Mass: -0.8 lb  Subjective:   1. Transaminitis She denies RUQ pain. She reports using NSAIDs for pain control. She endorses persistent fatigue.  2. Vitamin D deficiency  Latest Reference Range & Units 09/17/22 10:27 01/12/23 10:15  Vitamin D, 25-Hydroxy 30.0 - 100.0 ng/mL 62.6 59.2  She is on weekly Ergocalciferol- denies N/V/Muscle Weakness She endorses persistent fatigue.  3. Prediabetes Lab Results  Component Value Date   HGBA1C 5.7 (H) 01/12/2023   HGBA1C 5.7 (H) 09/17/2022   HGBA1C 5.4 03/18/2022   Her insurance will not cover any form of  GLP-1 therapy. She endorses increase in CHO/sugar cravings.  Assessment/Plan:   1. Transaminitis Check Labs at next OV  2. Vitamin D deficiency Check Labs at next OV - Vitamin D, Ergocalciferol, (DRISDOL) 1.25 MG (50000 UNIT) CAPS capsule; Take 1 capsule (50,000 Units total) by mouth every 7 (seven) days.  Dispense: 4 capsule; Refill: 0  3. Prediabetes Check Labs at next OV  4. Obesity current BMI-38.1 Convert from Cat 2 Meal Plan to Journaling Plan.  Lauren Garza is currently in the action stage of change. As such, her goal is to get back to weightloss efforts . She has agreed to keeping a food journal and adhering to recommended goals of 1200 calories and 85+ protein.   Handouts: Eating Out Guide  Exercise goals: For substantial health benefits, adults should do at least 150 minutes (2 hours and 30 minutes) a week of moderate-intensity, or 75 minutes (1 hour and 15 minutes) a week of vigorous-intensity aerobic physical activity, or an equivalent combination of moderate- and vigorous-intensity aerobic activity. Aerobic activity should be performed in episodes of at least 10 minutes, and preferably, it should be spread throughout the week.  Try to walk at least 2 x week  Behavioral modification strategies: increasing lean protein intake, decreasing simple carbohydrates, increasing vegetables, increasing water intake, no skipping meals, meal planning and cooking strategies, and planning for success.  Lauren Garza has agreed to follow-up with our clinic in 4 weeks. She was informed of the importance of frequent follow-up  visits to maximize her success with intensive lifestyle modifications for her multiple health conditions.   Check Fasting Labs next OV  Objective:   Blood pressure 129/87, pulse 71, temperature 98.2 F (36.8 C), height 5\' 3"  (1.6 m), weight 215 lb (97.5 kg), last menstrual period 02/11/2018, SpO2 100 %. Body mass index is 38.09 kg/m.  General: Cooperative, alert, well  developed, in no acute distress. HEENT: Conjunctivae and lids unremarkable. Cardiovascular: Regular rhythm.  Lungs: Normal work of breathing. Neurologic: No focal deficits.   Lab Results  Component Value Date   CREATININE 0.86 01/05/2023   BUN 13 01/05/2023   NA 138 01/05/2023   K 4.7 01/05/2023   CL 100 01/05/2023   CO2 24 01/05/2023   Lab Results  Component Value Date   ALT 19 09/17/2022   AST 22 09/17/2022   ALKPHOS 107 09/17/2022   BILITOT 0.2 09/17/2022   Lab Results  Component Value Date   HGBA1C 5.7 (H) 01/12/2023   HGBA1C 5.7 (H) 09/17/2022   HGBA1C 5.4 03/18/2022   HGBA1C 5.4 01/14/2022   HGBA1C 5.5 07/31/2021   Lab Results  Component Value Date   INSULIN 8.3 09/17/2022   INSULIN 10.3 03/18/2022   INSULIN 4.9 01/14/2022   INSULIN 7.0 07/31/2021   INSULIN 8.1 01/27/2021   Lab Results  Component Value Date   TSH 1.470 01/05/2023   Lab Results  Component Value Date   CHOL 164 09/17/2022   HDL 67 09/17/2022   LDLCALC 86 09/17/2022   TRIG 51 09/17/2022   CHOLHDL 2.4 09/17/2022   Lab Results  Component Value Date   VD25OH 59.2 01/12/2023   VD25OH 62.6 09/17/2022   VD25OH 48.7 03/18/2022   Lab Results  Component Value Date   WBC 9.1 11/07/2020   HGB 13.2 11/07/2020   HCT 41.9 11/07/2020   MCV 84.5 11/07/2020   PLT 311 11/07/2020   No results found for: "IRON", "TIBC", "FERRITIN"  Attestation Statements:   Reviewed by clinician on day of visit: allergies, medications, problem list, medical history, surgical history, family history, social history, and previous encounter notes.  I have reviewed the above documentation for accuracy and completeness, and I agree with the above. -  Neyah Ellerman d. Aliahna Statzer, NP-C

## 2023-05-22 ENCOUNTER — Telehealth: Payer: Self-pay | Admitting: Neurology

## 2023-05-22 NOTE — Telephone Encounter (Signed)
Please call: Lauren Garza, we do see some narrowing of the veins in your head, this could be due to the extra pressure in your head. We really need you to et the lumbar puncture, have you been called yet? I'll ask my team to call you; see phone note  I don;t see the lumbar puncture scheduled, would you call patient and talk about the avove and see why it isn't? We need it for likely IDIOPATHIC INTRACRANIAL HYPERTENSION  If high pressure is confir,ed, we can treat with diamox and talk about other options.  thanks

## 2023-05-23 ENCOUNTER — Encounter: Payer: Self-pay | Admitting: Neurology

## 2023-05-23 DIAGNOSIS — G971 Other reaction to spinal and lumbar puncture: Secondary | ICD-10-CM

## 2023-05-24 NOTE — Telephone Encounter (Signed)
Pt sent [place] a mychart message back stating she hasn't been called by GI yet about the LP. I have sent a secure message to [facility] w/ GI to look into this.

## 2023-05-24 NOTE — Telephone Encounter (Signed)
Lauren Garza now has patient scheduled for 05/31/23 @ 9:00

## 2023-05-25 ENCOUNTER — Telehealth: Payer: Self-pay

## 2023-05-25 DIAGNOSIS — M7061 Trochanteric bursitis, right hip: Secondary | ICD-10-CM

## 2023-05-25 NOTE — Addendum Note (Signed)
Addended by: Sharlet Salina on: 05/25/2023 01:19 PM   Modules accepted: Orders

## 2023-05-25 NOTE — Telephone Encounter (Signed)
Patient is calling to request a hip injection. She is having the same type of pain as before. She had great relief with the last injection and she started to have pain 2 weeks ago. Please advise

## 2023-05-31 ENCOUNTER — Ambulatory Visit
Admission: RE | Admit: 2023-05-31 | Discharge: 2023-05-31 | Disposition: A | Discharge status: Home / Self Care | Payer: Medicaid Other | Source: Ambulatory Visit | Attending: Neurology | Admitting: Neurology

## 2023-05-31 VITALS — BP 115/79 | HR 67

## 2023-05-31 DIAGNOSIS — R519 Headache, unspecified: Secondary | ICD-10-CM

## 2023-05-31 DIAGNOSIS — H9193 Unspecified hearing loss, bilateral: Secondary | ICD-10-CM

## 2023-05-31 DIAGNOSIS — G441 Vascular headache, not elsewhere classified: Secondary | ICD-10-CM

## 2023-05-31 DIAGNOSIS — G932 Benign intracranial hypertension: Secondary | ICD-10-CM

## 2023-05-31 DIAGNOSIS — E236 Other disorders of pituitary gland: Secondary | ICD-10-CM

## 2023-05-31 DIAGNOSIS — H539 Unspecified visual disturbance: Secondary | ICD-10-CM

## 2023-05-31 LAB — CSF CELL COUNT WITH DIFFERENTIAL
RBC Count, CSF: 9 cells/uL — ABNORMAL HIGH
TOTAL NUCLEATED CELL: 0 cells/uL (ref 0–5)

## 2023-05-31 LAB — PROTEIN, CSF: Total Protein, CSF: 25 mg/dL (ref 15–45)

## 2023-05-31 LAB — GLUCOSE, CSF: Glucose, CSF: 58 mg/dL (ref 40–80)

## 2023-05-31 NOTE — Discharge Instructions (Signed)

## 2023-06-01 ENCOUNTER — Other Ambulatory Visit: Payer: Self-pay | Admitting: Neurology

## 2023-06-01 MED ORDER — BUTALBITAL-APAP-CAFFEINE 50-325-40 MG PO TABS: 1.0000 | ORAL_TABLET | Freq: Four times a day (QID) | ORAL | 0 refills | Status: DC | PRN

## 2023-06-01 NOTE — Addendum Note (Signed)
Addended by: Raynald Kemp A on: 06/01/2023 08:00 AM   Modules accepted: Orders

## 2023-06-01 NOTE — Telephone Encounter (Signed)
Spoke with patient informed per [facility] imaging not able to give blood patch until tomorrow. Advised pt to lay flat for next 24 hours per Ginette Otto Sent Dr Lucia Gaskins message to see if any suggestions for patient since still having headaches  Pt expressed understanding and thanked me for calling

## 2023-06-02 ENCOUNTER — Ambulatory Visit
Admission: RE | Admit: 2023-06-02 | Discharge: 2023-06-02 | Disposition: A | Discharge status: Home / Self Care | Payer: Medicaid Other | Source: Ambulatory Visit | Attending: Neurology | Admitting: Neurology

## 2023-06-02 DIAGNOSIS — G971 Other reaction to spinal and lumbar puncture: Secondary | ICD-10-CM

## 2023-06-02 MED ORDER — IOPAMIDOL (ISOVUE-M 200) INJECTION 41%
1.0000 mL | Freq: Once | INTRAMUSCULAR | Status: AC
  Administered 2023-06-02: 1 mL via EPIDURAL

## 2023-06-02 NOTE — Discharge Instructions (Signed)
Blood Patch Discharge Instructions ? ?Go home and rest quietly for the next 24 hours.  It is important to lie flat for the next 24 hours.  Get up only to go to the restroom.  You may lie in the bed or on a couch on your back, your stomach, your left side or your right side.  You may have one pillow under your head.  You may have pillows between your knees while you are on your side or under your knees while you are on your back. ? ?DO NOT drive today.  Recline the seat as far back as it will go, while still wearing your seat belt, on the way home. ? ?You may get up to go to the bathroom as needed.  You may sit up for 10 minutes to eat.  You may resume your normal diet and medications unless otherwise indicated.  Drink lots of extra fluids today and tomorrow..  ? ?You may resume normal activities after your 24 hours of bed rest is over; however, do not exert yourself strongly or do any heavy lifting tomorrow. ? ?Call your physician for a follow-up appointment.  ? ?If you have any questions  after you arrive home, please call [phone]. ? ?Discharge instructions have been explained to the patient.  The patient, or the person responsible for the patient, fully understands these instructions. ? ?   ?

## 2023-06-02 NOTE — Progress Notes (Signed)
1 vial of blood drawn from pts Left AC to be sent off with LP lab work. 1 successful attempt, pt tolerated well. Gauze and tape applied after.

## 2023-06-05 ENCOUNTER — Other Ambulatory Visit: Payer: Self-pay

## 2023-06-05 ENCOUNTER — Encounter: Payer: Self-pay | Admitting: Neurology

## 2023-06-05 ENCOUNTER — Emergency Department (HOSPITAL_BASED_OUTPATIENT_CLINIC_OR_DEPARTMENT_OTHER)
Admission: EM | Admit: 2023-06-05 | Discharge: 2023-06-05 | Disposition: A | Discharge status: Home / Self Care | Payer: Medicaid Other | Attending: Emergency Medicine | Admitting: Emergency Medicine

## 2023-06-05 ENCOUNTER — Encounter (HOSPITAL_BASED_OUTPATIENT_CLINIC_OR_DEPARTMENT_OTHER): Payer: Self-pay | Admitting: Emergency Medicine

## 2023-06-05 ENCOUNTER — Emergency Department (HOSPITAL_BASED_OUTPATIENT_CLINIC_OR_DEPARTMENT_OTHER): Payer: Medicaid Other

## 2023-06-05 ENCOUNTER — Telehealth (HOSPITAL_BASED_OUTPATIENT_CLINIC_OR_DEPARTMENT_OTHER): Payer: Self-pay | Admitting: Emergency Medicine

## 2023-06-05 DIAGNOSIS — J45909 Unspecified asthma, uncomplicated: Secondary | ICD-10-CM | POA: Diagnosis not present

## 2023-06-05 DIAGNOSIS — R519 Headache, unspecified: Secondary | ICD-10-CM | POA: Diagnosis present

## 2023-06-05 DIAGNOSIS — Z7951 Long term (current) use of inhaled steroids: Secondary | ICD-10-CM | POA: Diagnosis not present

## 2023-06-05 DIAGNOSIS — D72829 Elevated white blood cell count, unspecified: Secondary | ICD-10-CM | POA: Insufficient documentation

## 2023-06-05 DIAGNOSIS — G971 Other reaction to spinal and lumbar puncture: Secondary | ICD-10-CM | POA: Insufficient documentation

## 2023-06-05 LAB — CBC WITH DIFFERENTIAL/PLATELET
Abs Immature Granulocytes: 0.05 10*3/uL (ref 0.00–0.07)
Basophils Absolute: 0.1 10*3/uL (ref 0.0–0.1)
Basophils Relative: 1 %
Eosinophils Absolute: 0.4 10*3/uL (ref 0.0–0.5)
Eosinophils Relative: 2 %
HCT: 41.4 % (ref 36.0–46.0)
Hemoglobin: 13.1 g/dL (ref 12.0–15.0)
Immature Granulocytes: 0 %
Lymphocytes Relative: 8 %
Lymphs Abs: 1.2 10*3/uL (ref 0.7–4.0)
MCH: 26.8 pg (ref 26.0–34.0)
MCHC: 31.6 g/dL (ref 30.0–36.0)
MCV: 84.8 fL (ref 80.0–100.0)
Monocytes Absolute: 0.7 10*3/uL (ref 0.1–1.0)
Monocytes Relative: 5 %
Neutro Abs: 12.4 10*3/uL — ABNORMAL HIGH (ref 1.7–7.7)
Neutrophils Relative %: 84 %
Platelets: 316 10*3/uL (ref 150–400)
RBC: 4.88 MIL/uL (ref 3.87–5.11)
RDW: 14 % (ref 11.5–15.5)
WBC: 14.8 10*3/uL — ABNORMAL HIGH (ref 4.0–10.5)
nRBC: 0 % (ref 0.0–0.2)

## 2023-06-05 LAB — BASIC METABOLIC PANEL
Anion gap: 10 (ref 5–15)
BUN: 13 mg/dL (ref 6–20)
CO2: 26 mmol/L (ref 22–32)
Calcium: 9.6 mg/dL (ref 8.9–10.3)
Chloride: 100 mmol/L (ref 98–111)
Creatinine, Ser: 0.73 mg/dL (ref 0.44–1.00)
GFR, Estimated: >60 mL/min (ref >60)
Glucose, Bld: 92 mg/dL (ref 70–99)
Potassium: 3.8 mmol/L (ref 3.5–5.1)
Sodium: 136 mmol/L (ref 135–145)

## 2023-06-05 LAB — PREGNANCY, URINE: Preg Test, Ur: NEGATIVE

## 2023-06-05 MED ORDER — DEXAMETHASONE SODIUM PHOSPHATE 4 MG/ML IJ SOLN
4.0000 mg | Freq: Once | INTRAMUSCULAR | Status: AC
  Administered 2023-06-05: 4 mg via INTRAVENOUS
  Filled 2023-06-05: qty 1

## 2023-06-05 MED ORDER — DIPHENHYDRAMINE HCL 50 MG/ML IJ SOLN
25.0000 mg | Freq: Once | INTRAMUSCULAR | Status: AC
  Administered 2023-06-05: 25 mg via INTRAVENOUS
  Filled 2023-06-05: qty 1

## 2023-06-05 MED ORDER — OXYCODONE HCL 5 MG PO TABS: 5.0000 mg | ORAL_TABLET | ORAL | 0 refills | Status: DC | PRN

## 2023-06-05 MED ORDER — HYDROMORPHONE HCL 1 MG/ML IJ SOLN
0.5000 mg | Freq: Once | INTRAMUSCULAR | Status: AC
  Administered 2023-06-05: 0.5 mg via INTRAVENOUS
  Filled 2023-06-05: qty 1

## 2023-06-05 MED ORDER — PROMETHAZINE HCL 25 MG/ML IJ SOLN
INTRAMUSCULAR | Status: AC
  Filled 2023-06-05: qty 1

## 2023-06-05 MED ORDER — SODIUM CHLORIDE 0.9 % IV SOLN
12.5000 mg | Freq: Four times a day (QID) | INTRAVENOUS | Status: DC | PRN
  Administered 2023-06-05: 12.5 mg via INTRAVENOUS
  Filled 2023-06-05: qty 0.5

## 2023-06-05 MED ORDER — ACETAMINOPHEN 325 MG PO TABS
650.0000 mg | ORAL_TABLET | Freq: Once | ORAL | Status: AC
  Administered 2023-06-05: 650 mg via ORAL
  Filled 2023-06-05: qty 2

## 2023-06-05 MED ORDER — MAGNESIUM SULFATE 2 GM/50ML IV SOLN
2.0000 g | Freq: Once | INTRAVENOUS | Status: AC
  Administered 2023-06-05: 2 g via INTRAVENOUS
  Filled 2023-06-05: qty 50

## 2023-06-05 MED ORDER — LACTATED RINGERS IV BOLUS
1000.0000 mL | Freq: Once | INTRAVENOUS | Status: AC
  Administered 2023-06-05: 1000 mL via INTRAVENOUS

## 2023-06-05 MED ORDER — METOCLOPRAMIDE HCL 5 MG/ML IJ SOLN
10.0000 mg | Freq: Once | INTRAMUSCULAR | Status: AC
  Administered 2023-06-05: 10 mg via INTRAVENOUS
  Filled 2023-06-05: qty 2

## 2023-06-05 MED ORDER — FENTANYL CITRATE PF 50 MCG/ML IJ SOSY
50.0000 ug | PREFILLED_SYRINGE | Freq: Once | INTRAMUSCULAR | Status: AC
  Administered 2023-06-05: 50 ug via INTRAVENOUS
  Filled 2023-06-05: qty 1

## 2023-06-05 NOTE — ED Triage Notes (Signed)
States LP on the first of jhuly and then had a blood patch  on the 3 rd  but her h/a has cont , makes her neck hurts , no fever

## 2023-06-05 NOTE — ED Provider Notes (Signed)
[name] Provider Note   CSN: [id number] Arrival date & time: 06/05/23  4098     History Chief Complaint  Patient presents with   Headache    Lauren Garza is a 42 y.o. female with history of migraines, seasonal allergies, and asthma presents emerged from today for evaluation of headache.  Patient received a lumbar puncture on 05-31-2023.  She had a headache after the lumbar puncture and received a blood patch on 06-02-2023.  She reports that the headache feels the same but is now having some diffuse posterior neck pain.  She denies any neck stiffness.  Denies any fever, visual changes, or any weakness.  She denies any photophobia or phonophobia.  She reports that she does feel better with laying down and worse with sitting up and walking.  She has tried Fioricet, ibuprofen, Aleve, and triptans without much relief of symptoms.  She received a lumbar puncture for potential diagnosis of IIH.  She denies any tobacco or EtOH.  Reports occasional marijuana use.  Allergic to clindamycin and Flagyl.   Headache Associated symptoms: congestion, nausea and neck pain   Associated symptoms: no abdominal pain, no diarrhea, no fever, no neck stiffness, no photophobia, no vomiting and no weakness        Home Medications Prior to Admission medications   Medication Sig Start Date End Date Taking? Authorizing Provider  butalbital-acetaminophen-caffeine (FIORICET) 50-325-40 MG tablet Take 1-2 tablets by mouth every 6 (six) hours as needed for headache. 06/01/23   Anson Fret, MD  oxyCODONE (ROXICODONE) 5 MG immediate release tablet Take 1 tablet (5 mg total) by mouth every 4 (four) hours as needed for severe pain. 06/05/23  Yes Achille Rich, PA-C  albuterol (ACCUNEB) 0.63 MG/3ML nebulizer solution Take by nebulization.    [provider]  Boric Acid Vaginal 600 MG SUPP INSERT 1 SUPPOSITORY VAGINALLY AT BEDTIME. FOR VAGINAL USE 01/24/21   [provider]  Cholecalciferol (VITAMIN D HIGH POTENCY PO) Take 5,000 Units by mouth every other day.    [provider]  cyanocobalamin (,VITAMIN B-12,) 1000 MCG/ML injection Inject 1,000 mcg into the skin every 30 (thirty) days. 01/28/21   [provider]  fluconazole (DIFLUCAN) 150 MG tablet Take 150 mg by mouth daily. 12/29/22   [provider]  levocetirizine (XYZAL) 5 MG tablet Take 1 tablet (5 mg total) by mouth every evening. 10/29/22   Rayburn, Fanny Bien, PA-C  montelukast (SINGULAIR) 10 MG tablet SMARTSIG:1 Tablet(s) By Mouth Every Evening 01/01/21   [provider]  nortriptyline (PAMELOR) 10 MG capsule Take 1 capsule (10 mg total) by mouth at bedtime. 01/05/23   Anson Fret, MD  omeprazole (PRILOSEC) 20 MG capsule 1 po qd or BID 07/09/22   Danford, Orpha Bur D, NP  ondansetron (ZOFRAN-ODT) 4 MG disintegrating tablet Take 1-2 tablets (4-8 mg total) by mouth every 8 (eight) hours as needed. 01/05/23   Anson Fret, MD  rizatriptan (MAXALT-MLT) 10 MG disintegrating tablet Take 1 tablet (10 mg total) by mouth as needed for migraine. May repeat in 2 hours if needed 01/05/23   Anson Fret, MD  spironolactone (ALDACTONE) 50 MG tablet Take by mouth. 12/29/22   [provider]  SYMBICORT 80-4.5 MCG/ACT inhaler SMARTSIG:2 Puff(s) By Mouth Twice Daily 01/14/21   [provider]  Ubrogepant (UBRELVY) 100 MG TABS Take 1 tablet (100 mg total) by mouth every 2 (two) hours as needed. Maximum 200mg  a day. 01/05/23  Anson Fret, MD  valACYclovir (VALTREX) 1000 MG tablet Take 1,000 mg by mouth 3 (three) times daily. 03/24/21   [provider]  Vitamin D, Ergocalciferol, (DRISDOL) 1.25 MG (50000 UNIT) CAPS capsule Take 1 capsule (50,000 Units total) by mouth every 7 (seven) days. 05/10/23   Danford, Orpha Bur D, NP      Allergies    Ceftin [cefuroxime], Clindamycin/lincomycin, Dust mite extract, Flagyl [metronidazole hcl], Lincomycin hcl, and  Metformin and related    Review of Systems   Review of Systems  Constitutional:  Negative for chills and fever.  HENT:  Positive for congestion and rhinorrhea.   Eyes:  Negative for photophobia and visual disturbance.  Respiratory:  Negative for shortness of breath.   Cardiovascular:  Negative for chest pain.  Gastrointestinal:  Positive for nausea. Negative for abdominal pain, constipation, diarrhea and vomiting.  Musculoskeletal:  Positive for neck pain. Negative for neck stiffness.  Neurological:  Positive for headaches. Negative for syncope and weakness.    Physical Exam Updated Vital Signs BP 112/68   Pulse 71   Temp 98.8 F (37.1 C) (Oral)   Resp 18   Ht 5\' 3"  (1.6 m)   Wt 97.8 kg   LMP 02/11/2018 (Exact Date)   SpO2 96%   BMI 38.19 kg/m  Physical Exam Vitals and nursing note reviewed.  Constitutional:      General: She is not in acute distress.    Appearance: She is not toxic-appearing.  HENT:     Mouth/Throat:     Mouth: Mucous membranes are moist.  Eyes:     General: No visual field deficit.    Extraocular Movements: Extraocular movements intact.     Pupils: Pupils are equal, round, and reactive to light.  Neck:     Comments: Diffuse posterior neck pain. No step off or deformity.  Pulmonary:     Effort: Pulmonary effort is normal. No respiratory distress.  Abdominal:     Palpations: Abdomen is soft.     Tenderness: There is no abdominal tenderness.  Musculoskeletal:     Cervical back: Normal range of motion and neck supple. No rigidity.  Skin:    General: Skin is warm and dry.  Neurological:     Mental Status: She is alert.     GCS: GCS eye subscore is 4. GCS verbal subscore is 5. GCS motor subscore is 6.     Cranial Nerves: No cranial nerve deficit, dysarthria or facial asymmetry.     Sensory: No sensory deficit.     Motor: No weakness.     Comments: GCS 15.  Cranial nerves intact.  Visual fields grossly intact as well.  She is answering questions  appropriately with appropriate speech.  No facial droop noted.  Sensation reportedly intact throughout per patient.  Strength is 5-5 in patient's upper and lower bilateral extremities.     ED Results / Procedures / Treatments   Labs (all labs ordered are listed, but only abnormal results are displayed) Labs Reviewed  CBC WITH DIFFERENTIAL/PLATELET - Abnormal; Notable for the following components:      Result Value   WBC 14.8 (*)    Neutro Abs 12.4 (*)    All other components within normal limits  PREGNANCY, URINE  BASIC METABOLIC PANEL    EKG None  Radiology CT Head Wo Contrast  Result Date: 06/05/2023 CLINICAL DATA:  Headache with increasing frequency/severity. Status post lumbar puncture with subsequent blood patch. EXAM: CT HEAD WITHOUT CONTRAST TECHNIQUE: Contiguous axial images  were obtained from the base of the skull through the vertex without intravenous contrast. RADIATION DOSE REDUCTION: This exam was performed according to the departmental dose-optimization program which includes automated exposure control, adjustment of the mA and/or kV according to patient size and/or use of iterative reconstruction technique. COMPARISON:  MR brain 02/01/2023 FINDINGS: Brain: No evidence of acute infarction, hemorrhage, hydrocephalus, extra-axial collection or mass lesion/mass effect. Vascular: No hyperdense vessel or unexpected calcification. Skull: Normal. Negative for fracture or focal lesion. Sinuses/Orbits: Previous bilateral median antrectomy and ethmoidectomy. Mild diffuse mucosal thickening is identified involving the paranasal sinuses. No air-fluid levels identified. Other: None. IMPRESSION: 1. No acute intracranial abnormality. 2. Previous bilateral median antrectomy and ethmoidectomy. Mild diffuse mucosal thickening is identified involving the paranasal sinuses. No air-fluid levels identified. Electronically Signed   By: Signa Kell M.D.   On: 06/05/2023 11:11     Procedures Procedures   Medications Ordered in ED Medications  promethazine (PHENERGAN) 12.5 mg in sodium chloride 0.9 % 50 mL IVPB (0 mg Intravenous Stopped 06/05/23 1344)  promethazine (PHENERGAN) 25 MG/ML injection (has no administration in time range)  diphenhydrAMINE (BENADRYL) injection 25 mg (25 mg Intravenous Given 06/05/23 1130)  dexamethasone (DECADRON) injection 4 mg (4 mg Intravenous Given 06/05/23 1132)  metoCLOPramide (REGLAN) injection 10 mg (10 mg Intravenous Given 06/05/23 1133)  lactated ringers bolus 1,000 mL (0 mLs Intravenous Stopped 06/05/23 1344)  acetaminophen (TYLENOL) tablet 650 mg (650 mg Oral Given 06/05/23 1136)  fentaNYL (SUBLIMAZE) injection 50 mcg (50 mcg Intravenous Given 06/05/23 1135)  magnesium sulfate IVPB 2 g 50 mL (0 g Intravenous Stopped 06/05/23 1344)  HYDROmorphone (DILAUDID) injection 0.5 mg (0.5 mg Intravenous Given 06/05/23 1402)    ED Course/ Medical Decision Making/ A&P    Medical Decision Making Amount and/or Complexity of Data Reviewed Labs: ordered. Radiology: ordered.  Risk OTC drugs. Prescription drug management.   42 y.o. female presents to the ER for evaluation of headache sp lumbar puncture. Differential diagnosis includes but is not limited to Stroke, increased ICP, meningitis, CVA, intracranial tumor, venous sinus thrombosis, migraine, cluster headache, hypertension, drug related, head injury, tension headache, sinusitis, dental abscess, otitis media, TMJ. Vital signs mild tachycardia at 101, otherwise unremarkable. Physical exam as noted above.   I independently reviewed and interpreted the patient's labs.  CBC does show elevated white blood cell count of 14.8 with a left shift.  No anemia.  Pregnancy test is negative.  BMP shows no electrolyte abnormality.  CT head shows : 1. No acute intracranial abnormality.  2. Previous bilateral median antrectomy and ethmoidectomy. Mild  diffuse mucosal thickening is identified involving the  paranasal  sinuses. No air-fluid levels identified.   I consulted neurology and spoke with Dr. Bing Neighbors.  She recommends the patient likely needs another blood patch but unfortunately that would not be able to be done over the weekend.  She recommends contacting outpatient neuro on Monday for the patient.  She discourages use of Toradol and Fioricet and recommends Benadryl, Decadron, and Reglan as well as some IV fluids.  Ultimately, she will need to follow-up with her neurologist.  Migraine cocktail per neurology recommendations ordered.  Additionally ordered some fentanyl and some Tylenol as well.  On reevaluation, patient reports her headache is unchanged.  Will order her some magnesium.  On reevaluation, patient still experiencing pain, I repaged neurology and spoke with Dr. Selina Cooley again.  She recommends again follow-up with neurology but can be discharged home on some narcotic pain medication.  Patient  was given dose of Dilaudid while she was here and did have some relief of some of her pain.  Will send her home with some Roxicodone and recommend 1000mg  of Tylenol every 6 hours.  Discouraged her use of ibuprofen.  Recommended that she call neurology first thing Monday morning to see if they can get her in for a blood patch.  I discussed this case with my attending.  We do not think that this is any meningitis given the patient has reassuring vital signs, is not altered, does not have any nuchal rigidity.  She does have an elevated white blood cell count which could be from stress.  I do not appreciate any focal neurodeficits.  Given neurology consultation as well as my attending's recommendations, will discharge home with narcotic pain medication. Patient is amenable to the plan.  I encouraged her to return to the ER if she still has pain, fever, neck stiffness, or worsening headache.  Encouraged her to come in if she has any trouble walking or talking as well.  We discussed the results of  the labs/imaging. The plan is follow-up with neurology, take medication as prescribed. We discussed strict return precautions and red flag symptoms. The patient verbalized their understanding and agrees to the plan. The patient is stable and being discharged home in good condition.  Portions of this report may have been transcribed using voice recognition software. Every effort was made to ensure accuracy; however, inadvertent computerized transcription errors may be present.   I discussed this case with my attending physician who cosigned this note including patient's presenting symptoms, physical exam, and planned diagnostics and interventions. Attending physician stated agreement with plan or made changes to plan which were implemented.   Final Clinical Impression(s) / ED Diagnoses Final diagnoses:  Post lumbar puncture headache    Rx / DC Orders ED Discharge Orders          Ordered    oxyCODONE (ROXICODONE) 5 MG immediate release tablet  Every 4 hours PRN        06/05/23 1541              Achille Rich, PA-C 06/05/23 1853    Sloan Leiter, DO 06/10/23 [phone]

## 2023-06-05 NOTE — Telephone Encounter (Signed)
Patient called requesting different pharmacy.  She would like her pain medication sent to [facility] on [address]. Left message with the [facility] on [address] to cancel prescription

## 2023-06-05 NOTE — Discharge Instructions (Signed)
You were seen in the ER for evaluation of your headache. Please make sure you call your neurologist first thing to schedule a blood patching. In the meantime, please make sure you that are lying flat. I am prescribing you some narcotic pain medication to take as needed. I recommend taking 1000mg  of Tylenol every 6 hours as needed for pain. For the narcotic pain medication, please do not drive or operate heavy machinery while on this medication because it can make you sleeping. If you have any concerns, new or worsening symptoms, please return to the nearest ER for re-evaluation.   Contact a health care provider if: You are nauseous and vomit. Get help right away if: Your pain becomes very severe. Your pain cannot be controlled. You develop a fever. You have a stiff neck. You have vision problems. You lose control of your bowel or bladder (have incontinence). You have trouble walking, you feel weak, or you lose feeling in part of your body.

## 2023-06-07 ENCOUNTER — Encounter: Payer: Self-pay | Admitting: Neurology

## 2023-06-08 ENCOUNTER — Telehealth: Payer: Self-pay | Admitting: Neurology

## 2023-06-08 MED ORDER — CYCLOBENZAPRINE HCL 10 MG PO TABS
10.0000 mg | ORAL_TABLET | Freq: Every day | ORAL | 3 refills | Status: DC
Start: 2023-06-08 — End: 2023-07-22

## 2023-06-08 MED ORDER — TOPIRAMATE 100 MG PO TABS: ORAL_TABLET | ORAL | 3 refills | Status: DC

## 2023-06-08 NOTE — Telephone Encounter (Signed)
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  We discussed Topamax and possibly Diamox.  I started her on Topamax we have another follow-up in August.  She also said that her back still hurts from the lumbar puncture and the blood patch, I gave her some Flexeril to take at night, if it still hurting I told her to come into the office in August and not do a video so that I can examine her.  If she does not like Topamax we can try Diamox and may be even later on CGRP such as Ajovy or Emgality.

## 2023-06-08 NOTE — Telephone Encounter (Signed)
She feels taking the fluid out helped a little bit. OP was 22, slightly elevated. Nurtec/ubrelvy did not work. She is having 3 headaches a week. Feels like a lot of pressure.

## 2023-06-10 ENCOUNTER — Other Ambulatory Visit: Payer: Self-pay

## 2023-06-10 ENCOUNTER — Ambulatory Visit: Payer: Medicaid Other | Admitting: Physical Medicine and Rehabilitation

## 2023-06-10 DIAGNOSIS — M7062 Trochanteric bursitis, left hip: Secondary | ICD-10-CM | POA: Diagnosis not present

## 2023-06-10 DIAGNOSIS — M7061 Trochanteric bursitis, right hip: Secondary | ICD-10-CM | POA: Diagnosis not present

## 2023-06-10 NOTE — Progress Notes (Signed)
Functional Pain Scale - descriptive words and definitions  Distressing (6)    Pain is present/unable to complete most ADLs limited by pain/sleep is difficult and active distraction is only marginal. Moderate range order  Average Pain 9   +Driver, -BT, -Dye Allergies.  Right hip pain 

## 2023-06-10 NOTE — Progress Notes (Signed)
Lauren Garza - 42 y.o. female MRN [id number]  Date of birth: 1981-08-19  Office Visit Note: Visit Date: 06/10/2023 PCP: [name], MD Referred by: [name], MD  Subjective: Chief Complaint  Patient presents with   Right Hip - Pain   HPI:  Lauren Garza is a 42 y.o. female who comes in today for planned repeat Bilateral greater trochanteric injections with fluoroscopic guidance.  The patient has failed conservative care including home exercise, medications, time and activity modification. Prior injection gave more than 50% relief for several months. This injection will be diagnostic and hopefully therapeutic.  Please see requesting physician notes for further details and justification.  Referring: Ellin Goodie, FNP   ROS Otherwise per HPI.  Assessment & Plan: Visit Diagnoses:    ICD-10-CM   1. Greater trochanteric bursitis, right  M70.61 XR C-ARM NO REPORT    Large Joint Inj: bilateral greater trochanter    2. Greater trochanteric bursitis, left  M70.62 XR C-ARM NO REPORT    Large Joint Inj: bilateral greater trochanter      Plan: No additional findings.   Meds & Orders: No orders of the defined types were placed in this encounter.   Orders Placed This Encounter  Procedures   Large Joint Inj: bilateral greater trochanter   XR C-ARM NO REPORT    Follow-up: No follow-ups on file.   Procedures: Large Joint Inj: bilateral greater trochanter on 06/10/2023 9:20 AM Indications: pain and diagnostic evaluation Details: 22 G 3.5 in needle, fluoroscopy-guided lateral approach  Arthrogram: No  Medications (Right): 40 mg triamcinolone acetonide 40 MG/ML; 5 mL bupivacaine 0.25 % Medications (Left): 40 mg triamcinolone acetonide 40 MG/ML; 5 mL bupivacaine 0.25 % Outcome: tolerated well, no immediate complications  There was excellent flow of contrast outlined the greater trochanteric bursa without vascular uptake. Procedure, treatment alternatives, risks and  benefits explained, specific risks discussed. Consent was given by the patient. Immediately prior to procedure a time out was called to verify the correct patient, procedure, equipment, support staff and site/side marked as required. Patient was prepped and draped in the usual sterile fashion.          Clinical History: EXAM: MRI LUMBAR SPINE WITHOUT CONTRAST   TECHNIQUE: Multiplanar, multisequence MR imaging of the lumbar spine was performed. No intravenous contrast was administered.   COMPARISON:  None available.   FINDINGS: Segmentation: Standard. Lowest well-formed disc space labeled the L5-S1 level.   Alignment: Mild dextroscoliosis. Alignment otherwise normal preservation of the normal lumbar lordosis.   Vertebrae: Vertebral body height maintained without evidence for acute or chronic fracture. Bone marrow signal intensity within normal limits. No discrete or worrisome osseous lesions. No abnormal marrow edema.   Conus medullaris and cauda equina: Conus extends to the L1 level. Conus and cauda equina appear normal.   Paraspinal and other soft tissues: Paraspinous soft tissues within normal limits. Visualized visceral structures are normal.   Disc levels:   L1-2:  Unremarkable.   L2-3:  Unremarkable.   L3-4: Disc desiccation with minimal annular disc bulge. No stenosis or impingement.   L4-5: Normal interspace. Mild facet hypertrophy. No stenosis or impingement.   L5-S1: Normal interspace. Mild facet hypertrophy. No stenosis or impingement.   IMPRESSION: 1. Mild degenerative disc disease at L3-4 without stenosis or impingement. 2. Mild facet hypertrophy at L4-5 and L5-S1. 3. Underlying mild dextroscoliosis.     Electronically Signed   By: Rise Mu M.D.   On: 02/24/2020 22:54  Objective:  VS:  HT:    WT:   BMI:     BP:   HR: bpm  TEMP: ( )  RESP:  Physical Exam Vitals and nursing note reviewed.  Constitutional:      General:  She is not in acute distress.    Appearance: Normal appearance. She is not ill-appearing.  HENT:     Head: Normocephalic and atraumatic.     Right Ear: External ear normal.     Left Ear: External ear normal.  Eyes:     Extraocular Movements: Extraocular movements intact.  Cardiovascular:     Rate and Rhythm: Normal rate.     Pulses: Normal pulses.  Pulmonary:     Effort: Pulmonary effort is normal. No respiratory distress.  Abdominal:     General: There is no distension.     Palpations: Abdomen is soft.  Musculoskeletal:        General: Tenderness present.     Cervical back: Neck supple.     Right lower leg: No edema.     Left lower leg: No edema.     Comments: Patient has good distal strength with concordant pain over the greater trochanters.  No clonus or focal weakness.  Skin:    Findings: No erythema, lesion or rash.  Neurological:     General: No focal deficit present.     Mental Status: She is alert and oriented to person, place, and time.     Sensory: No sensory deficit.     Motor: No weakness or abnormal muscle tone.     Coordination: Coordination normal.  Psychiatric:        Mood and Affect: Mood normal.        Behavior: Behavior normal.      Imaging: No results found.

## 2023-06-15 ENCOUNTER — Ambulatory Visit (INDEPENDENT_AMBULATORY_CARE_PROVIDER_SITE_OTHER): Payer: Medicaid Other | Admitting: Adult Health

## 2023-06-15 ENCOUNTER — Encounter (INDEPENDENT_AMBULATORY_CARE_PROVIDER_SITE_OTHER): Payer: Self-pay | Admitting: Adult Health

## 2023-06-15 VITALS — BP 133/89 | HR 84 | Temp 98.1°F | Ht 63.0 in | Wt 218.0 lb

## 2023-06-15 DIAGNOSIS — R7303 Prediabetes: Secondary | ICD-10-CM | POA: Diagnosis not present

## 2023-06-15 DIAGNOSIS — E538 Deficiency of other specified B group vitamins: Secondary | ICD-10-CM

## 2023-06-15 DIAGNOSIS — E559 Vitamin D deficiency, unspecified: Secondary | ICD-10-CM

## 2023-06-15 DIAGNOSIS — Z6841 Body Mass Index (BMI) 40.0 and over, adult: Secondary | ICD-10-CM

## 2023-06-15 DIAGNOSIS — E669 Obesity, unspecified: Secondary | ICD-10-CM

## 2023-06-15 DIAGNOSIS — G971 Other reaction to spinal and lumbar puncture: Secondary | ICD-10-CM

## 2023-06-15 DIAGNOSIS — Z6838 Body mass index (BMI) 38.0-38.9, adult: Secondary | ICD-10-CM

## 2023-06-15 MED ORDER — VITAMIN D (ERGOCALCIFEROL) 1.25 MG (50000 UNIT) PO CAPS
50000.0000 [IU] | ORAL_CAPSULE | ORAL | 0 refills | Status: DC
Start: 2023-06-15 — End: 2023-09-13

## 2023-06-15 NOTE — Progress Notes (Signed)
WEIGHT SUMMARY AND BIOMETRICS  Vitals Temp: 98.1 F (36.7 C) BP: 133/89 Pulse Rate: 84 SpO2: 96 %   Anthropometric Measurements Height: 5\' 3"  (1.6 m) Weight: 218 lb (98.9 kg) BMI (Calculated): 38.63 Weight at Last Visit: 215lb Weight Lost Since Last Visit: 0lb Weight Gained Since Last Visit: 3lb Starting Weight: 237lb Total Weight Loss (lbs): 19 lb (8.618 kg)   Body Composition  Body Fat %: 45 % Fat Mass (lbs): 98.2 lbs Muscle Mass (lbs): 114.2 lbs Total Body Water (lbs): 84.2 lbs Visceral Fat Rating : 11   Other Clinical Data Fasting: yes Labs: yes Today's Visit #: 62 Starting Date: 03/19/20    Chief Complaint:   OBESITY Lauren Garza is here to discuss her progress with her obesity treatment plan. She is on the the Category 2 Plan and states she is following her eating plan approximately 0 % of the time. She states she is not currently exercising.   Interim History:  06/05/2023 ED OV notes- Lauren Garza is a 42 y.o. female with history of migraines, seasonal allergies, and asthma presents emerged from today for evaluation of headache.  Patient received a lumbar puncture on 05-31-2023.  She had a headache after the lumbar puncture and received a blood patch on 06-02-2023.  She reports that the headache feels the same but is now having some diffuse posterior neck pain.  She denies any neck stiffness.  Denies any fever, visual changes, or any weakness.  She denies any photophobia or phonophobia.  She reports that she does feel better with laying down and worse with sitting up and walking.  She has tried Fioricet, ibuprofen, Aleve, and triptans without much relief of symptoms.  She received a lumbar puncture for potential diagnosis of IIH.  She denies any tobacco or EtOH.  Reports occasional marijuana use.  Allergic to clindamycin and Flagyl.  EXAM:  CT HEAD WITHOUT CONTRAST  CLINICAL DATA:  Headache with increasing frequency/severity. Status post lumbar puncture  with subsequent blood patch. IMPRESSION: 1. No acute intracranial abnormality. 2. Previous bilateral median antrectomy and ethmoidectomy. Mild diffuse mucosal thickening is identified involving the paranasal sinuses. No air-fluid levels identified.  Reviewed Bioempedence results with pt: Muscle Mass: +1.4 lbs Adipose Mass: +1.8 lbs  Of note- Started on Topamax 100mg  1/2 tab every day on/about 06/09/2023 Will increase to full tab in 2 weeks  Subjective:   1. Prediabetes Lab Results  Component Value Date   HGBA1C 5.7 (H) 01/12/2023   HGBA1C 5.7 (H) 09/17/2022   HGBA1C 5.4 03/18/2022   She is unable to tolerate Metformin, re: immediate yeast infection Her insurance will not cover any GLP-1/GIP-GLP-1 AOMs   2. Headache, post-lumbar puncture Lumbar Puncture on 05/31/2023 She developed severe post procedure HA Received Blood Patch 06/02/2023 ED Visit 06/05/2023- reviewed all notes, imaging, and labs. She reports only a dull HA at present. Denies change in vision. Denies GI upset.  Started on Topamax 100mg  1/2 tab every day on/about 06/09/2023 Will increase to full tab in 2 weeks  3. Vitamin D deficiency  Latest Reference Range & Units 01/12/23 10:15  Vitamin D, 25-Hydroxy 30.0 - 100.0 ng/mL 59.2   She is on weekly Ergocalciferol- denies N/V/Muscle Weakness  5. Vitamin B12 deficiency She is monthly B12 injections She endorses persistent fatigue   Assessment/Plan:   1. Prediabetes Check Labs - Hemoglobin A1c - Insulin, random  2. Headache, post-lumbar puncture F/u with Neurology/Dr. Lucia Gaskins as directed  Increase topamax dose per Dr. Lucia Gaskins instructions  3. Vitamin D deficiency Check Labs and Refil - Vitamin D, Ergocalciferol, (DRISDOL) 1.25 MG (50000 UNIT) CAPS capsule; Take 1 capsule (50,000 Units total) by mouth every 7 (seven) days.  Dispense: 4 capsule; Refill: 0 - VITAMIN D 25 Hydroxy (Vit-D Deficiency, Fractures)  6. Vitamin B12 deficiency Check Labs - Vitamin  B12  4. Obesity, Current BMI 38.63  [name] is not currently in the action stage of change. As such, her goal is to get back to weightloss efforts . She has agreed to the Category 2 Plan.   Exercise goals: No exercise has been prescribed at this time.  Behavioral modification strategies: increasing lean protein intake, decreasing simple carbohydrates, increasing vegetables, increasing water intake, no skipping meals, meal planning and cooking strategies, better snacking choices, travel eating strategies, and planning for success.  [name] has agreed to follow-up with our clinic in 4 weeks. She was informed of the importance of frequent follow-up visits to maximize her success with intensive lifestyle modifications for her multiple health conditions.   Dayton was informed we would discuss her lab results at her next visit unless there is a critical issue that needs to be addressed sooner. Bhavika agreed to keep her next visit at the agreed upon time to discuss these results.  Objective:   Blood pressure 133/89, pulse 84, temperature 98.1 F (36.7 C), height 5\' 3"  (1.6 m), weight 218 lb (98.9 kg), last menstrual period 02/11/2018, SpO2 96%. Body mass index is 38.62 kg/m.  General: Cooperative, alert, well developed, in no acute distress. HEENT: Conjunctivae and lids unremarkable. Cardiovascular: Regular rhythm.  Lungs: Normal work of breathing. Neurologic: No focal deficits.   Lab Results  Component Value Date   CREATININE 0.73 06/05/2023   BUN 13 06/05/2023   NA 136 06/05/2023   K 3.8 06/05/2023   CL 100 06/05/2023   CO2 26 06/05/2023   Lab Results  Component Value Date   ALT 19 09/17/2022   AST 22 09/17/2022   ALKPHOS 107 09/17/2022   BILITOT 0.2 09/17/2022   Lab Results  Component Value Date   HGBA1C 5.7 (H) 01/12/2023   HGBA1C 5.7 (H) 09/17/2022   HGBA1C 5.4 03/18/2022   HGBA1C 5.4 01/14/2022   HGBA1C 5.5 07/31/2021   Lab Results  Component Value Date    INSULIN 8.3 09/17/2022   INSULIN 10.3 03/18/2022   INSULIN 4.9 01/14/2022   INSULIN 7.0 07/31/2021   INSULIN 8.1 01/27/2021   Lab Results  Component Value Date   TSH 1.470 01/05/2023   Lab Results  Component Value Date   CHOL 164 09/17/2022   HDL 67 09/17/2022   LDLCALC 86 09/17/2022   TRIG 51 09/17/2022   CHOLHDL 2.4 09/17/2022   Lab Results  Component Value Date   VD25OH 59.2 01/12/2023   VD25OH 62.6 09/17/2022   VD25OH 48.7 03/18/2022   Lab Results  Component Value Date   WBC 14.8 (H) 06/05/2023   HGB 13.1 06/05/2023   HCT 41.4 06/05/2023   MCV 84.8 06/05/2023   PLT 316 06/05/2023   No results found for: "IRON", "TIBC", "FERRITIN"  Attestation Statements:   Reviewed by clinician on day of visit: allergies, medications, problem list, medical history, surgical history, family history, social history, and previous encounter notes.  I have reviewed the above documentation for accuracy and completeness, and I agree with the above. -  Ausencio Vaden d. Scotlynn Noyes, NP-C

## 2023-06-16 LAB — VITAMIN D 25 HYDROXY (VIT D DEFICIENCY, FRACTURES): Vit D, 25-Hydroxy: 49.6 ng/mL (ref 30.0–100.0)

## 2023-06-16 LAB — VITAMIN B12: Vitamin B-12: >2000 pg/mL — ABNORMAL HIGH (ref 232–1245)

## 2023-06-16 LAB — HEMOGLOBIN A1C
Est. average glucose Bld gHb Est-mCnc: 114 mg/dL
Hgb A1c MFr Bld: 5.6 % (ref 4.8–5.6)

## 2023-06-16 LAB — INSULIN, RANDOM: INSULIN: 30.9 u[IU]/mL — ABNORMAL HIGH (ref 2.6–24.9)

## 2023-06-17 ENCOUNTER — Encounter (INDEPENDENT_AMBULATORY_CARE_PROVIDER_SITE_OTHER): Payer: Self-pay | Admitting: Adult Health

## 2023-06-21 ENCOUNTER — Encounter (INDEPENDENT_AMBULATORY_CARE_PROVIDER_SITE_OTHER): Payer: Self-pay | Admitting: Adult Health

## 2023-06-21 ENCOUNTER — Telehealth (INDEPENDENT_AMBULATORY_CARE_PROVIDER_SITE_OTHER): Payer: Self-pay

## 2023-06-21 NOTE — Telephone Encounter (Signed)
Left a message for patient to call me to discuss lab results on Thursday 06/17/2023 but patient never returned my call.

## 2023-06-30 MED ORDER — BUPIVACAINE HCL 0.25 % IJ SOLN
5.0000 mL | INTRAMUSCULAR | Status: AC | PRN
Start: 2023-06-10 — End: 2023-06-10
  Administered 2023-06-10: 5 mL via INTRA_ARTICULAR

## 2023-06-30 MED ORDER — TRIAMCINOLONE ACETONIDE 40 MG/ML IJ SUSP
40.0000 mg | INTRAMUSCULAR | Status: AC | PRN
Start: 2023-06-10 — End: 2023-06-10
  Administered 2023-06-10: 40 mg via INTRA_ARTICULAR

## 2023-07-05 ENCOUNTER — Telehealth: Payer: Self-pay | Admitting: Neurology

## 2023-07-05 MED ORDER — ACETAZOLAMIDE 125 MG PO TABS: 125.0000 mg | ORAL_TABLET | Freq: Two times a day (BID) | ORAL | 9 refills | Status: DC

## 2023-07-05 NOTE — Telephone Encounter (Signed)
Please move patient from August 12th to 8/22 at 10am I already spoke with her thanks - and block the slot I'm taking that day off

## 2023-07-05 NOTE — Progress Notes (Signed)
Cancelled by provider but I did speak with patient on the phone briefly. Having diarrhe with the topamax. Sleepy. Cognitive changes. Switch to diamox 125mg  twice daily and then move appointment forward a week or two to see how its working. Will move appointment    Meds ordered this encounter  Medications   acetaZOLAMIDE (DIAMOX) 125 MG tablet    Sig: Take 1 tablet (125 mg total) by mouth 2 (two) times daily.    Dispense:  60 tablet    Refill:  9

## 2023-07-06 NOTE — Telephone Encounter (Signed)
Rescheduled appt

## 2023-07-12 ENCOUNTER — Telehealth (INDEPENDENT_AMBULATORY_CARE_PROVIDER_SITE_OTHER): Payer: Self-pay | Admitting: Neurology

## 2023-07-12 DIAGNOSIS — G932 Benign intracranial hypertension: Secondary | ICD-10-CM

## 2023-07-12 DIAGNOSIS — G43009 Migraine without aura, not intractable, without status migrainosus: Secondary | ICD-10-CM

## 2023-07-13 ENCOUNTER — Encounter (INDEPENDENT_AMBULATORY_CARE_PROVIDER_SITE_OTHER): Payer: Self-pay | Admitting: Adult Health

## 2023-07-13 ENCOUNTER — Ambulatory Visit (INDEPENDENT_AMBULATORY_CARE_PROVIDER_SITE_OTHER): Payer: Medicaid Other | Admitting: Adult Health

## 2023-07-13 ENCOUNTER — Other Ambulatory Visit (HOSPITAL_COMMUNITY): Payer: Self-pay

## 2023-07-13 VITALS — BP 132/78 | HR 80 | Temp 98.9°F | Ht 63.0 in | Wt 209.0 lb

## 2023-07-13 DIAGNOSIS — E559 Vitamin D deficiency, unspecified: Secondary | ICD-10-CM

## 2023-07-13 DIAGNOSIS — R7401 Elevation of levels of liver transaminase levels: Secondary | ICD-10-CM | POA: Diagnosis not present

## 2023-07-13 DIAGNOSIS — J3089 Other allergic rhinitis: Secondary | ICD-10-CM

## 2023-07-13 DIAGNOSIS — E538 Deficiency of other specified B group vitamins: Secondary | ICD-10-CM

## 2023-07-13 DIAGNOSIS — R7303 Prediabetes: Secondary | ICD-10-CM

## 2023-07-13 DIAGNOSIS — Z6837 Body mass index (BMI) 37.0-37.9, adult: Secondary | ICD-10-CM

## 2023-07-13 DIAGNOSIS — E669 Obesity, unspecified: Secondary | ICD-10-CM

## 2023-07-13 MED ORDER — WEGOVY 0.25 MG/0.5ML ~~LOC~~ SOAJ
0.2500 mg | SUBCUTANEOUS | 0 refills | Status: DC
  Filled 2023-07-13: qty 2, 28d supply, fill #0

## 2023-07-13 MED ORDER — WEGOVY 0.25 MG/0.5ML ~~LOC~~ SOAJ
0.2500 mg | SUBCUTANEOUS | 0 refills | Status: DC
Start: 2023-07-13 — End: 2023-07-13

## 2023-07-13 NOTE — Progress Notes (Signed)
WEIGHT SUMMARY AND BIOMETRICS  Vitals Temp: 98.9 F (37.2 C) BP: 132/78 Pulse Rate: 80 SpO2: 98 %   Anthropometric Measurements Height: 5\' 3"  (1.6 m) Weight: 209 lb (94.8 kg) BMI (Calculated): 37.03 Weight at Last Visit: 218lb Weight Lost Since Last Visit: 9lb Weight Gained Since Last Visit: 0 Starting Weight: 237lb Total Weight Loss (lbs): 28 lb (12.7 kg)   Body Composition  Body Fat %: 46 % Fat Mass (lbs): 96.2 lbs Muscle Mass (lbs): 107 lbs Total Body Water (lbs): 81.6 lbs Visceral Fat Rating : 11   Other Clinical Data Fasting: no Labs: no Today's Visit #: 39 Starting Date: 03/19/20    Chief Complaint:   OBESITY Lauren Garza is here to discuss her progress with her obesity treatment plan. She is on the the Category 2 Plan and states she is following her eating plan approximately 40 % of the time. She states she is exercising Brisk Walking 21 minutes 2 times per week.   Interim History:  Dr. Ahern/Neurologist Started on Topamax 100mg  1/2 tab every day on/about 06/09/2023 On/about 06/23/2023 she increased to 1 full 100mg  tab at bedtime- tolerating well.  Hunger/appetite-she denies polyphagia at this time.  Exercise-she has been increasing intensity of walking.  Subjective:   1. Vitamin B12 deficiency Discussed Labs  Latest Reference Range & Units 06/15/23 10:17  Vitamin B12 232 - 1245 pg/mL >2000 (H)  (H): Data is abnormally high  OVER REPLACED She is on monthly Cyanocobalamin 1072mcg/ml injection per PCP  2. Transaminitis Discussed Labs  She denies RUQ or recent GI upset  3. Prediabetes Discussed Labs Lab Results  Component Value Date   HGBA1C 5.6 06/15/2023   HGBA1C 5.7 (H) 01/12/2023   HGBA1C 5.7 (H) 09/17/2022    Metformin intolerant, re: Yeast Injection Rybelsus intolerant, re: extreme fatigue She denies family hx of MEN 2 or MTC She denies personal hx of pancreatitis Discussed risks/benefits of GLP-1 therapy, re: Wegovy  4.  Vitamin D deficiency Discussed Labs  Latest Reference Range & Units 01/12/23 10:15 06/15/23 10:17  Vitamin D, 25-Hydroxy 30.0 - 100.0 ng/mL 59.2 49.6   Level therapeutic on weekly Ergocalciferol- denies N/V/Muscle Weakness  5. Environmental and Seasonal Allergies Currently on daily Xyzal and remaining well hydrated with water  Assessment/Plan:   1. Vitamin B12 deficiency Hold next B12 injection Recommend every other month  2. Transaminitis Continue with weight loss efforts and avoid Hepatotoxic substances  3. Prediabetes Start GLP-1 therapy as directed  4. Vitamin D deficiency Refill Vitamin D, Ergocalciferol, (DRISDOL) 1.25 MG (50000 UNIT) CAPS capsule Take 1 capsule (50,000 Units total) by mouth every 7 (seven) days. Dispense: 4 capsule, Refills: 0 ordered   5. Environmental and Seasonal Allergies Refill levocetirizine (XYZAL) 5 MG tablet Take 1 tablet (5 mg total) by mouth every evening. Dispense: 90 tablet, Refills: 0 ordered   6. Obesity, Current BMI 37.03 Start Semaglutide-Weight Management (WEGOVY) 0.25 MG/0.5ML SOAJ Inject 0.25 mg into the skin once a week. Dispense: 2 mL, Refills: 0 ordered   Lauren Garza is currently in the action stage of change. As such, her goal is to continue with weight loss efforts. She has agreed to the Category 2 Plan.   Exercise goals: For substantial health benefits, adults should do at least 150 minutes (2 hours and 30 minutes) a week of moderate-intensity, or 75 minutes (1 hour and 15 minutes) a week of vigorous-intensity aerobic physical activity, or an equivalent combination of moderate- and vigorous-intensity aerobic activity. Aerobic activity should be  performed in episodes of at least 10 minutes, and preferably, it should be spread throughout the week.  Behavioral modification strategies: increasing lean protein intake, decreasing simple carbohydrates, increasing vegetables, increasing water intake, decreasing eating out, meal planning  and cooking strategies, keeping healthy foods in the home, ways to avoid boredom eating, and planning for success.  Lauren Garza has agreed to follow-up with our clinic in 3 weeks. She was informed of the importance of frequent follow-up visits to maximize her success with intensive lifestyle modifications for her multiple health conditions.   Objective:   Blood pressure 132/78, pulse 80, temperature 98.9 F (37.2 C), height 5\' 3"  (1.6 m), weight 209 lb (94.8 kg), last menstrual period 02/11/2018, SpO2 98%. Body mass index is 37.02 kg/m.  General: Cooperative, alert, well developed, in no acute distress. HEENT: Conjunctivae and lids unremarkable. Cardiovascular: Regular rhythm.  Lungs: Normal work of breathing. Neurologic: No focal deficits.   Lab Results  Component Value Date   CREATININE 0.73 06/05/2023   BUN 13 06/05/2023   NA 136 06/05/2023   K 3.8 06/05/2023   CL 100 06/05/2023   CO2 26 06/05/2023   Lab Results  Component Value Date   ALT 19 09/17/2022   AST 22 09/17/2022   ALKPHOS 107 09/17/2022   BILITOT 0.2 09/17/2022   Lab Results  Component Value Date   HGBA1C 5.6 06/15/2023   HGBA1C 5.7 (H) 01/12/2023   HGBA1C 5.7 (H) 09/17/2022   HGBA1C 5.4 03/18/2022   HGBA1C 5.4 01/14/2022   Lab Results  Component Value Date   INSULIN 30.9 (H) 06/15/2023   INSULIN 8.3 09/17/2022   INSULIN 10.3 03/18/2022   INSULIN 4.9 01/14/2022   INSULIN 7.0 07/31/2021   Lab Results  Component Value Date   TSH 1.470 01/05/2023   Lab Results  Component Value Date   CHOL 164 09/17/2022   HDL 67 09/17/2022   LDLCALC 86 09/17/2022   TRIG 51 09/17/2022   CHOLHDL 2.4 09/17/2022   Lab Results  Component Value Date   VD25OH 49.6 06/15/2023   VD25OH 59.2 01/12/2023   VD25OH 62.6 09/17/2022   Lab Results  Component Value Date   WBC 14.8 (H) 06/05/2023   HGB 13.1 06/05/2023   HCT 41.4 06/05/2023   MCV 84.8 06/05/2023   PLT 316 06/05/2023   No results found for: "IRON",  "TIBC", "FERRITIN"  Attestation Statements:   Reviewed by clinician on day of visit: allergies, medications, problem list, medical history, surgical history, family history, social history, and previous encounter notes.  I have reviewed the above documentation for accuracy and completeness, and I agree with the above. -   d. , NP-C

## 2023-07-14 ENCOUNTER — Telehealth (INDEPENDENT_AMBULATORY_CARE_PROVIDER_SITE_OTHER): Payer: Self-pay | Admitting: Adult Health

## 2023-07-14 ENCOUNTER — Other Ambulatory Visit (HOSPITAL_COMMUNITY): Payer: Self-pay

## 2023-07-14 NOTE — Telephone Encounter (Signed)
PA submitted fo Wegovy, waiting on a determination. 12:11 07/14/23 KP

## 2023-07-14 NOTE — Telephone Encounter (Signed)
PA for [facility] was denied, stating patient did not meet medical necessity. An appeal is available. 07/14/23 3:10pm KP

## 2023-07-19 ENCOUNTER — Telehealth: Payer: Self-pay | Admitting: Neurology

## 2023-07-19 NOTE — Telephone Encounter (Signed)
Appointment changed. Can you call patient and let her know?

## 2023-07-19 NOTE — Telephone Encounter (Signed)
Pt states re: her 8-22 appointment she has been informed that Dr Lucia Gaskins wants her seen in office due to her eyes.  [facility] would not allow phone staff to change appointment from mychart to office visit, please call pt once appointment has been changed back to an in office.

## 2023-07-22 ENCOUNTER — Encounter: Payer: Self-pay | Admitting: *Deleted

## 2023-07-22 ENCOUNTER — Telehealth: Payer: Medicaid Other | Admitting: Neurology

## 2023-07-22 ENCOUNTER — Encounter: Payer: Self-pay | Admitting: Neurology

## 2023-07-22 ENCOUNTER — Ambulatory Visit (INDEPENDENT_AMBULATORY_CARE_PROVIDER_SITE_OTHER): Payer: Medicaid Other | Admitting: Neurology

## 2023-07-22 ENCOUNTER — Other Ambulatory Visit (HOSPITAL_COMMUNITY): Payer: Self-pay

## 2023-07-22 VITALS — BP 118/78 | HR 80 | Ht 63.0 in | Wt 206.0 lb

## 2023-07-22 DIAGNOSIS — Q048 Other specified congenital malformations of brain: Secondary | ICD-10-CM

## 2023-07-22 DIAGNOSIS — G43709 Chronic migraine without aura, not intractable, without status migrainosus: Secondary | ICD-10-CM | POA: Diagnosis not present

## 2023-07-22 DIAGNOSIS — G932 Benign intracranial hypertension: Secondary | ICD-10-CM

## 2023-07-22 MED ORDER — AJOVY 225 MG/1.5ML ~~LOC~~ SOAJ
225.0000 mg | SUBCUTANEOUS | Status: DC
Start: 2023-07-22 — End: 2024-08-01

## 2023-07-22 MED ORDER — KETOROLAC TROMETHAMINE 60 MG/2ML IM SOLN
60.0000 mg | Freq: Once | INTRAMUSCULAR | Status: AC
Start: 2023-07-22 — End: 2023-07-22
  Administered 2023-07-22: 60 mg via INTRAMUSCULAR

## 2023-07-22 MED ORDER — TOPIRAMATE 100 MG PO TABS
ORAL_TABLET | ORAL | 3 refills | Status: DC
Start: 2023-07-22 — End: 2024-06-14

## 2023-07-22 MED ORDER — CIPROFLOXACIN HCL 500 MG PO TABS: 500.0000 mg | ORAL_TABLET | Freq: Two times a day (BID) | ORAL | 0 refills | Status: DC

## 2023-07-22 MED ORDER — AJOVY 225 MG/1.5ML ~~LOC~~ SOAJ: 225.0000 mg | SUBCUTANEOUS | 11 refills | Status: DC

## 2023-07-22 MED ORDER — METHYLPREDNISOLONE 4 MG PO TBPK: ORAL_TABLET | ORAL | 1 refills | Status: DC

## 2023-07-22 NOTE — Progress Notes (Signed)
Per v.o. Dr Lucia Gaskins, patient was given Toradol 60 mg IM in RUOQ of R gluteal muscle. Patient was also given Ajovy 225 mg SQ injection into LLQ of abdomen. Pt aware her next injection is due on 08/22/23. Patient tolerated injections well. See MAR.

## 2023-07-22 NOTE — Progress Notes (Signed)
[facility]    Provider:  Dr Lucia Gaskins Requesting Provider: [name], MD Primary Care Provider:  [name], MD  CC: Migraines  HPI 07/22/2023: Here for new concern headaches, past medical history bursitis of both hips, GERD, acute reaction to stress, allergies, chronic sinusitis, transaminitis, insomnia, vitamin B12 deficiency, hyperlipidemia, morbid obesity, asthma, meralgia paresthetica, kidney stones, hysterectomy, generalized headaches.   She is here alone. She has had sporadic headaches in the past but never sever headaches, a few months ago, she went and saw Dr. Jenne Pane and he said not sinuses or allergies, nurtec did not help, nausea, on both temples, pulsating/pounding/throbbing, light and sound sensitivity, she layed down in a quiet room that helped, moving made it worse. 2nd migraines she has ever had. Lasted for days. Nothing helped, waxed and wained for days. She went to the ED, given imitrex, reglan, toradol and took a while but she felt better. Maternal aunt had headaches but had a brain aneurysm, no pulsatile tinnitus. She can wake up with headaches, no known snoring, doesn't feel refereshed, she may doze off sometimes and she can sit at a red light and fall asleep. Blurry vision, worse in the morning and laying down, hurts to move, new headache, moderate to severe. No other focal neurologic deficits, associated symptoms, inciting events or modifiable factors.  Daily headaches. 15 migraine days a month. Last 24 hours.   Reviewed notes, labs and imaging from outside physicians, which showed:  From a thorough review of records, medications tried that can be used in migraine management include: Tylenol, Benadryl, Decadron, Topamax contraindicated due to kidney stones, propranolol contraindicated due to asthma, blood pressure medications, ibuprofen, ketorolac, lisinopril, Robaxin, Reglan, Zofran, Phenergan, nurtec, propranolol, rizatriptan(side effects), Bernita Raisin (not  work), topamax, amoitriptline/notriptyline, Aimovig containdicated due to constipation  MRI brain 01/2023:  IMPRESSION:  personally reviewed outside of appointment, extra 10 minutes The brain appears normal before and after contrast. 3 to 4 mm of cerebellar ectopia, not enough to be considered a Chiari malformation. Partially empty sella turcica.  This is often an incidental finding but could also be seen with elevated intracranial pressure. Remote sinus surgery and current chronic sphenoid, maxillary and ethmoid sinusitis Normal enhancement pattern.   LP 05/31/2023: IMPRESSION: Lumbar puncture on the right at L2-3. Elevated opening pressure at 22 cm of water.   CT head 2023: COMPARISON:  Brain CT 10/16/2010   FINDINGS: Brain: No evidence of acute infarction, hemorrhage, hydrocephalus, extra-axial collection or mass lesion/mass effect.   Vascular: No hyperdense vessel or unexpected calcification.   Skull: Intact.   Sinuses/Orbits: Post sinus surgery. Mucosal thickening involving the paranasal sinuses. No air-fluid levels. Mastoid air cells are unremarkable.   Other: None   IMPRESSION: 1. No acute intracranial abnormalities.      Latest Ref Rng & Units 06/05/2023   10:04 AM 11/07/2020    9:15 AM 03/19/2020   11:40 AM  CBC  WBC 4.0 - 10.5 K/uL 14.8  9.1  9.2   Hemoglobin 12.0 - 15.0 g/dL 88.4  16.6  06.3   Hematocrit 36.0 - 46.0 % 41.4  41.9  40.9   Platelets 150 - 400 K/uL 316  311  345       Latest Ref Rng & Units 06/05/2023   10:04 AM 01/05/2023    8:37 AM 09/17/2022   10:27 AM  CMP  Glucose 70 - 99 mg/dL 92  75  82   BUN 6 - 20 mg/dL 13  13  11    Creatinine  0.44 - 1.00 mg/dL 6.38  7.56  4.33   Sodium 135 - 145 mmol/L 136  138  142   Potassium 3.5 - 5.1 mmol/L 3.8  4.7  4.8   Chloride 98 - 111 mmol/L 100  100  103   CO2 22 - 32 mmol/L 26  24  26    Calcium 8.9 - 10.3 mg/dL 9.6  9.7  9.0   Total Protein 6.0 - 8.5 g/dL   7.0   Total Bilirubin 0.0 - 1.2 mg/dL   0.2    Alkaline Phos 44 - 121 IU/L   107   AST 0 - 40 IU/L   22   ALT 0 - 32 IU/L   19      CC:  Thigh pain  HPI 01/16/2020:  AKANE BURDICK is a 42 y.o. female here as requested by [name], MD for pain in the right thigh, merlgi paresthetica, severe obesity BMI of 40-44. PMHx plantar fasciitis, hypertension, frequent headaches, asthma, kidney stones, hysterectomy.  I reviewed Tirraney Osborne's notes, patient came in for pain in her thighs, bilateral thigh pain, been ongoing at least several months but stable, thighs are very tender to touch, she has not seen PT or chiropractor, she is obese and asked for phentermine, thigh pain positive for numbness, on examination tenderness to palpation about her thighs bilaterally, she was given a shot of Depo-Medrol, she was started on gabapentin, suspicion for meralgia paresthetica, weight loss was recommended for her severe obesity class III.  Symptoms started years ago with numbness in the outer thighs. No inciting events, no accidents or trauma or anything that preceded. In October/Novemebr of last year she was seen for plantar fasciitis and she was walking differently and the pain in the thigh worsened it is burning, throbbing, severe, radiating, in the lateral thigh area, doesn't move, doesn't radiate past the knees, no low back pain associated, symmetric and bilateral, rolling over in bed makes it worse. tanding for long periods of time make it worse. Sitting makes it worse. Can be severe. Also buttocks pain. She went to physical therapy and it did not help. She has had insurance issues. Gabapentin doesn't help. This is everyday, severe, burning, nothing helps, comes and goes all day. She is going to the bathroom more, feels stream is weaker. No other focal neurologic deficits, associated symptoms, inciting events or modifiable factors.No abdominal pain.   Reviewed notes, labs and imaging from outside physicians, which showed:  CT Abdomen Pelvis  02/06/2018 reviewed report: 1. Status post hysterectomy. Edema and ill-defined complex fluid within the pelvis, likely due to postoperative chronic hemorrhage/hematoma. No drainable abscess. 2. No bowel obstruction or other acute complication. 3.  Possible constipation. 4. Mild bladder wall thickening could represent cystitis. 5. Left nephrolithiasis.    Review of Systems: Patient complains of symptoms per HPI as well as the following symptoms: weight gain. Pertinent negatives and positives per HPI. All others negative.   Social History   Socioeconomic History   Marital status: Divorced    Spouse name: Not on file   Number of children: 3   Years of education: Not on file   Highest education level: 11th grade  Occupational History   Occupation: Chief Technology Officer  Tobacco Use   Smoking status: Former   Smokeless tobacco: Never   Tobacco comments:    rare smoker 9 years ago  Vaping Use   Vaping status: Never Used  Substance and Sexual Activity   Alcohol use: Yes  Comment: Rare, 1x year   Drug use: No   Sexual activity: Yes    Birth control/protection: Surgical  Other Topics Concern   Not on file  Social History Narrative   Lives at home with her children and parents    Right handed   Caffeine: coffee, 1 cup/day   Social Determinants of Health   Financial Resource Strain: Not on file  Food Insecurity: Not on file  Transportation Needs: Not on file  Physical Activity: Not on file  Stress: Not on file  Social Connections: Not on file  Intimate Partner Violence: Not on file    Family History  Problem Relation Age of Onset   Hypertension Mother    Diabetes Mother    Other Mother        questionable nerve issue, has numbness in thighs too   Sleep apnea Mother    Obesity Mother    Heart attack Father    Headache Paternal Aunt    Diabetes Maternal Grandmother     Past Medical History:  Diagnosis Date   Abnormal Pap smear    Acid reflux    Allergies    Asthma     Bacterial vaginosis    Bursitis    Chlamydia trachomatis infection 2012   Constipation    Depression    DUB (dysfunctional uterine bleeding)    Family history of adverse reaction to anesthesia    mom has n and V past surgery   Frequent headaches    migraines   Hypertension    "not anymore", taken off BP meds per pt report   Meralgia paresthetica    Plantar fasciitis    right     Patient Active Problem List   Diagnosis Date Noted   Cerebellar tonsillar ectopia (HCC) 07/26/2023   Class 2 drug-induced obesity with serious comorbidity and body mass index (BMI) of 37.0 to 37.9 in adult 01/27/2023   Dry mouth and eyes 01/27/2023   Non-restorative sleep 01/27/2023   Chronic migraine without aura without status migrainosus, not intractable 01/27/2023   Morning headache 01/27/2023   Excessive daytime sleepiness 01/27/2023   Hematochezia 10/05/2022   Greater trochanteric bursitis of both hips 07/16/2022   Gastroesophageal reflux disease 07/16/2022   Acute reaction to stress 06/19/2022   Environmental and seasonal allergies 09/15/2021   At risk for side effect of medication 04/01/2021   Chronic sinusitis 02/25/2021   Transaminitis 02/11/2021   History of tooth extraction 12/23/2020   Insomnia 12/23/2020   Hemorrhagic ovarian cyst left 11/12/2020   SOB (shortness of breath) on exertion 09/26/2020   Vitamin B12 deficiency 08/19/2020   Other hyperlipidemia, pure 08/19/2020   Vitamin D deficiency 05/21/2020   Asthma 05/21/2020   Insulin resistance 05/06/2020   Meralgia paresthetica of both lower extremities 01/16/2020   Class 3 severe obesity with serious comorbidity and body mass index (BMI) of 40.0 to 44.9 in adult [facility]) 01/16/2020   Abnormal laboratory test 03/09/2018   Kidney stone on left side 02/06/2018   S/P vaginal hysterectomy 02/01/2018   Generalized headaches 10/25/2017    Past Surgical History:  Procedure Laterality Date   GYNECOLOGIC CRYOSURGERY     I & D  EXTREMITY Right 04/04/2015   Procedure: IRRIGATION AND DEBRIDEMENT RIGHT WRIST;  Surgeon: Betha Loa, MD;  Location: [facility] OR;  Service: Orthopedics;  Laterality: Right;   SINUS ENDO WITH FUSION N/A 01/25/2017   Procedure: SINUS ENDO WITH FUSION;  Surgeon: Christia Reading, MD;  Location: [facility];  Service: ENT;  Laterality: N/A;   TUBAL LIGATION     VAGINAL HYSTERECTOMY Bilateral 02/01/2018   Procedure: HYSTERECTOMY VAGINAL WITH SALPINGECTOMY;  Surgeon: Hermina Staggers, MD;  Location: [facility];  Service: Gynecology;  Laterality: Bilateral;   WISDOM TOOTH EXTRACTION     WRIST SURGERY      Current Outpatient Medications  Medication Sig Dispense Refill   albuterol (ACCUNEB) 0.63 MG/3ML nebulizer solution Take by nebulization.     Boric Acid Vaginal 600 MG SUPP INSERT 1 SUPPOSITORY VAGINALLY AT BEDTIME. FOR VAGINAL USE     ciprofloxacin (CIPRO) 500 MG tablet Take 1 tablet (500 mg total) by mouth 2 (two) times daily. 20 tablet 0   cyanocobalamin (,VITAMIN B-12,) 1000 MCG/ML injection Inject 1,000 mcg into the skin every 30 (thirty) days.     fluconazole (DIFLUCAN) 150 MG tablet Take 150 mg by mouth daily.     Fremanezumab-vfrm (AJOVY) 225 MG/1.5ML SOAJ Inject 225 mg into the skin every 30 (thirty) days. 1 mL 11   levocetirizine (XYZAL) 5 MG tablet Take 1 tablet (5 mg total) by mouth every evening. 90 tablet 0   methylPREDNISolone (MEDROL DOSEPAK) 4 MG TBPK tablet Take pills daily all together with food. Take the first dose (6 pills) as soon as possible. Take the rest each morning. For 6 days total 6-5-4-3-2-1. 21 tablet 1   montelukast (SINGULAIR) 10 MG tablet SMARTSIG:1 Tablet(s) By Mouth Every Evening     ondansetron (ZOFRAN-ODT) 4 MG disintegrating tablet Take 1-2 tablets (4-8 mg total) by mouth every 8 (eight) hours as needed. 30 tablet 3   SYMBICORT 80-4.5 MCG/ACT inhaler SMARTSIG:2 Puff(s) By Mouth Twice Daily     valACYclovir (VALTREX) 1000 MG tablet Take 1,000 mg by mouth 3 (three)  times daily.     Vitamin D, Ergocalciferol, (DRISDOL) 1.25 MG (50000 UNIT) CAPS capsule Take 1 capsule (50,000 Units total) by mouth every 7 (seven) days. 4 capsule 0   Fremanezumab-vfrm (AJOVY) 225 MG/1.5ML SOAJ Inject 225 mg into the skin every 30 (thirty) days.     topiramate (TOPAMAX) 100 MG tablet Take 1.5 pills(150,g) for 2 weeks at bedtime and then increase to 200mg  (2 tabes) at bedtime 180 tablet 3   No current facility-administered medications for this visit.    Allergies as of 07/22/2023 - Review Complete 07/22/2023  Allergen Reaction Noted   Ceftin [cefuroxime]  05/01/2021   Clindamycin/lincomycin Hives 08/15/2011   Dust mite extract  01/26/2018   Flagyl [metronidazole hcl] Hives 08/15/2011   Lincomycin hcl Hives 08/15/2011   Metformin and related  04/01/2021    Vitals: BP 118/78 (BP Location: Right Arm, Patient Position: Sitting)   Pulse 80   Ht 5\' 3"  (1.6 m)   Wt 206 lb (93.4 kg) Comment: pt reported  LMP 02/11/2018 (Exact Date)   BMI 36.49 kg/m  Last Weight:  Wt Readings from Last 1 Encounters:  07/22/23 206 lb (93.4 kg)   Last Height:   Ht Readings from Last 1 Encounters:  07/22/23 5\' 3"  (1.6 m)    Physical exam: Exam: Gen: NAD, conversant, well nourised, obese, well groomed                     CV: RRR, no MRG. No Carotid Bruits. No peripheral edema, warm, nontender Eyes: Conjunctivae clear without exudates or hemorrhage  Neuro: Detailed Neurologic Exam  Speech:    Speech is normal; fluent and spontaneous with normal comprehension.  Cognition:    The patient is oriented to  person, place, and time;     recent and remote memory intact;     language fluent;     normal attention, concentration,     fund of knowledge Cranial Nerves:    The pupils are equal, round, and reactive to light. The fundi are flat. Visual fields are full to finger confrontation. Extraocular movements are intact. Trigeminal sensation is intact and the muscles of mastication are  normal. The face is symmetric. The palate elevates in the midline. Hearing intact. Voice is normal. Shoulder shrug is normal. The tongue has normal motion without fasciculations.   Coordination: nml  Gait: nml  Motor Observation:    No asymmetry, no atrophy, and no involuntary movements noted. Tone:    Normal muscle tone.    Posture:    Posture is normal. normal erect    Strength:    Strength is V/V in the upper and lower limbs.      Sensation: intact to LT     Reflex Exam:  DTR's:    Deep tendon reflexes in the upper and lower extremities are normal bilaterally.   Toes:    The toes are downgoing bilaterally.   Clonus:    Clonus is absent.    Assessment/Plan: 42 year old with Chronic migraines  Increase topiramate today OP 22 elevated but does not fit clinical criteria for iih Start Cipro as Dr. 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 3 to 4 mm of cerebellar ectopia, not enough to be considered a Chiari malformation. Partially empty sella turcica.  This is often an incidental finding but could also be seen with elevated intracranial pressure. Remote sinus surgery and current chronic sphenoid, maxillary and ethmoid sinusitis Normal enhancement pattern.   LP 05/31/2023: IMPRESSION: Lumbar puncture on the right at L2-3. Elevated opening pressure at 22 cm of water.   No orders of the defined types were placed in this encounter.  Meds ordered this encounter  Medications   topiramate (TOPAMAX) 100 MG tablet    Sig: Take 1.5 pills(150,g) for 2 weeks at bedtime and then increase to 200mg  (2 tabes) at bedtime    Dispense:  180 tablet    Refill:  3    ciprofloxacin (CIPRO) 500 MG tablet    Sig: Take 1 tablet (500 mg total) by mouth 2 (two) times daily.    Dispense:  20 tablet    Refill:  0   Fremanezumab-vfrm (AJOVY) 225 MG/1.5ML SOAJ    Sig: Inject 225 mg into the skin every 30 (thirty) days.    Dispense:  1 mL    Refill:  11   DISCONTD: Fremanezumab-vfrm (AJOVY) 225 MG/1.5ML SOAJ    Sig: Inject 225 mg into the skin every 30 (thirty) days.    Dispense:  1 mL    Refill:  11   ketorolac (TORADOL) injection 60 mg   methylPREDNISolone (MEDROL DOSEPAK) 4 MG TBPK tablet    Sig: Take pills daily all together with food. Take the first dose (6 pills) as soon as possible. Take the rest each morning. For 6 days total 6-5-4-3-2-1.    Dispense:  21 tablet    Refill:  1   Fremanezumab-vfrm (AJOVY)  225 MG/1.5ML SOAJ    Sig: Inject 225 mg into the skin every 30 (thirty) days.    Order Specific Question:   Lot Number?    Answer:   [id number]    Order Specific Question:   Expiration Date?    Answer:   05/29/2024    Order Specific Question:   NDC    Answer:   13086-578-46 [[id number]]    Order Specific Question:   Quantity    Answer:   1    Cc: [name], MD,  [name], MD  Naomie Dean, MD  [facility] [address] [place], [place] [address]  Phone [phone] Fax [phone]  I spent over 30 minutes of face-to-face and non-face-to-face time with patient on the  1. Chronic migraine without aura without status migrainosus, not intractable   2. Cerebellar tonsillar ectopia (HCC)   3. IIH (idiopathic intracranial hypertension)     diagnosis.  This included previsit chart review, lab review, study review, order entry, electronic health record documentation, patient education on the different diagnostic and therapeutic options, counseling and coordination of care, risks and benefits of management, compliance, or risk factor reduction

## 2023-07-22 NOTE — Patient Instructions (Addendum)
Increase topiramate Start Cipro as Dr. Jenne Pane did for URI Lauren Garza once month for migraine prevention Hysterectomy- no need for urine preg test  Lauren Garza Injection What is this medication? Lauren Garza (fre ma NEZ ue mab) prevents migraines. It works by blocking a substance in the body that causes migraines. It is a monoclonal antibody. This medicine may be used for other purposes; ask your health care provider or pharmacist if you have questions. COMMON BRAND NAME(S): Lauren Garza What should I tell my care team before I take this medication? They need to know if you have any of these conditions: An unusual or allergic reaction to Lauren Garza, other medications, foods, dyes, or preservatives Pregnant or trying to get pregnant Breast-feeding How should I use this medication? This medication is injected under the skin. You will be taught how to prepare and give it. Take it as directed on the prescription label. Keep taking it unless your care team tells you to stop. It is important that you put your used needles and syringes in a special sharps container. Do not put them in a trash can. If you do not have a sharps container, call your pharmacist or care team to get one. Talk to your care team about the use of this medication in children. Special care may be needed. Overdosage: If you think you have taken too much of this medicine contact a poison control center or emergency room at once. NOTE: This medicine is only for you. Do not share this medicine with others. What if I miss a dose? If you miss a dose, take it as soon as you can. If it is almost time for your next dose, take only that dose. Do not take double or extra doses. What may interact with this medication? Interactions are not expected. This list may not describe all possible interactions. Give your health care provider a list of all the medicines, herbs, non-prescription drugs, or dietary supplements you use. Also tell them if you  smoke, drink alcohol, or use illegal drugs. Some items may interact with your medicine. What should I watch for while using this medication? Tell your care team if your symptoms do not start to get better or if they get worse. What side effects may I notice from receiving this medication? Side effects that you should report to your care team as soon as possible: Allergic reactions or angioedema--skin rash, itching or hives, swelling of the face, eyes, lips, tongue, arms, or legs, trouble swallowing or breathing Side effects that usually do not require medical attention (report to your care team if they continue or are bothersome): Pain, redness, or irritation at injection site This list may not describe all possible side effects. Call your doctor for medical advice about side effects. You may report side effects to FDA at 1-800-FDA-1088. Where should I keep my medication? Keep out of the reach of children and pets. Store in a refrigerator or at room temperature between 20 and 25 degrees C (68 and 77 degrees F). Refrigeration (preferred): Store in the refrigerator. Do not freeze. Keep in the original container until you are ready to take it. Remove the dose from the carton about 30 minutes before it is time for you to use it. If the dose is not used, it may be stored in the original container at room temperature for 7 days. Get rid of any unused medication after the expiration date. Room Temperature: This medication may be stored at room temperature for up to 7 days. Keep it  in the original container. Protect from light until time of use. If it is stored at room temperature, get rid of any unused medication after 7 days or after it expires, whichever is first. To get rid of medications that are no longer needed or have expired: Take the medication to a medication take-back program. Check with your pharmacy or law enforcement to find a location. If you cannot return the medication, ask your pharmacist  or care team how to get rid of this medication safely. NOTE: This sheet is a summary. It may not cover all possible information. If you have questions about this medicine, talk to your doctor, pharmacist, or health care provider.  2024 Elsevier/Gold Standard (2022-01-09 00:00:00)  Ciprofloxacin Tablets What is this medication? CIPROFLOXACIN (sip roe FLOX a sin) treats infections caused by bacteria. It belongs to a group of medications called quinolone antibiotics. It will not treat colds, the flu, or infections caused by viruses. This medicine may be used for other purposes; ask your health care provider or pharmacist if you have questions. COMMON BRAND NAME(S): Cipro What should I tell my care team before I take this medication? They need to know if you have any of these conditions: Bone, joint, or tendon problems Diabetes Heart disease History of irregular heartbeat or rhythm Low levels of potassium or magnesium in the blood Kidney disease Liver disease Myasthenia gravis Seizures Tingling of the fingers or toes or other nerve disorder An unusual or allergic reaction to ciprofloxacin, other medications, foods, dyes, or preservatives Pregnant or trying to get pregnant Breastfeeding How should I use this medication? Take this medication by mouth with a full glass of water. Take it as directed on the prescription label at the same time every day. Do not crush or chew this medication. You may cut the tablet in half if it is scored (has a line in the middle of it). This may help you swallow the tablet if the whole tablet is too big. Be sure to take both halves. Do not take just one-half of the tablet. You can take it with or without food. If it upsets your stomach, take it with food. Take all of this medication unless your care team tells you to stop it early. Keep taking it even if you think you are better. Take products with aluminum, calcium, iron, magnesium, or zinc in them at a different  time of day than this medication. Take these products 6 hours BEFORE or 2 hours AFTER taking a dose of this medication. A special MedGuide will be given to you by the pharmacist with each prescription and refill. Be sure to read this information carefully each time. Talk to your care team about the use of this medication in children. Special care may be needed. Overdosage: If you think you have taken too much of this medicine contact a poison control center or emergency room at once. NOTE: This medicine is only for you. Do not share this medicine with others. What if I miss a dose? If you miss a dose, take it as soon as you can. If it is almost time for your next dose, take only that dose. Do not take double or extra doses. What may interact with this medication? Do not take this medication with any of the following: Cisapride Dronedarone Flibanserin Lomitapide Pimozide Thioridazine Tizanidine This medication may also interact with the following: Antacids Caffeine Certain medications for diabetes, such as glipizide, glyburide, or insulin Certain medications that treat or prevent blood clots,  such as warfarin Clozapine Cyclosporine Didanosine buffered tablets or powder Dofetilide Duloxetine Estrogen or progestin hormones Lanthanum carbonate Lidocaine Methotrexate Multivitamins NSAIDS, medications for pain and inflammation, such as ibuprofen or naproxen Olanzapine Omeprazole Other medications that cause heart rhythm changes Phenytoin Probenecid Ropinirole Sevelamer Sildenafil Sucralfate Theophylline Ziprasidone Zolpidem This list may not describe all possible interactions. Give your health care provider a list of all the medicines, herbs, non-prescription drugs, or dietary supplements you use. Also tell them if you smoke, drink alcohol, or use illegal drugs. Some items may interact with your medicine. What should I watch for while using this medication? Visit your care  team for regular checks on your progress. Tell your care team if your symptoms do not start to get better or if they get worse. This medication may affect your coordination, reaction time, or judgment. Do not drive or operate machinery until you know how this medication affects you. Sit up or stand slowly to reduce the risk of dizzy or fainting spells. Drinking alcohol with this medication can increase the risk of these side effects. Do not treat diarrhea with over the counter products. Contact your care team if you have diarrhea that lasts more than 2 days or if it is severe and watery. This medication can make you more sensitive to the sun. Keep out of the sun. If you cannot avoid being in the sun, wear protective clothing and sunscreen. Do not use sun lamps, tanning beds, or tanning booths. This medication may cause tendon problems. Tendons are the cords of tissue that connect your muscles to your bones. Tell your care team right away if you have pain, swelling, or stiffness while you are taking this medication or after you have stopped treatment. The risk is higher in people older than 40 years of age, those taking steroid medications, and those who have had a kidney, heart, or lung transplant. This medication may worsen muscle weakness in people with myasthenia gravis. This can cause breathing problems. Call your care team right away if you have myasthenia gravis and have worsening symptoms while taking this medication. This medication may cause serious skin reactions. They can happen weeks to months after starting the medication. Contact your care team right away if you notice fevers or flu-like symptoms with a rash. The rash may be red or purple and then turn into blisters or peeling of the skin. You may also notice a red rash with swelling of the face, lips, or lymph nodes in your neck or under your arms. Tell your care team if you are taking medications to treat diabetes. This medication may cause  changes to blood sugar levels. Talk to your care team about how often to check your blood sugar while taking this medication. Know the symptoms of low blood sugar and how to treat it. What side effects may I notice from receiving this medication? Side effects that you should report to your care team as soon as possible: Allergic reactions--skin rash, itching, hives, swelling of the face, lips, tongue, or throat Heart rhythm changes--fast or irregular heartbeat, dizziness, feeling faint or lightheaded, chest pain, trouble breathing Increased pressure around the brain--severe headache, blurry vision, change in vision, nausea, vomiting Joint, muscle, or tendon pain, swelling, or stiffness Liver injury--right upper belly pain, loss of appetite, nausea, light-colored stool, dark yellow or brown urine, yellowing skin or eyes, unusual weakness or fatigue Mood and behavior changes--anxiety, nervousness, confusion, hallucinations, irritability, hostility, thoughts of suicide or self-harm, worsening mood, feelings of depression Pain,  tingling, or numbness in the hands or feet Redness, blistering, peeling, or loosening of the skin, including inside the mouth Severe diarrhea, fever Seizures Sudden or severe chest, back, or stomach pain Unusual vaginal discharge, itching, or odor Side effects that usually do not require medical attention (report these to your care team if they continue or are bothersome): Diarrhea Dry mouth Headache Nausea Skin reactions on sun-exposed areas This list may not describe all possible side effects. Call your doctor for medical advice about side effects. You may report side effects to FDA at 1-800-FDA-1088. Where should I keep my medication? Keep out of the reach of children and pets. Store at room temperature below 30 degrees C (86 degrees F). Keep container tightly closed. Throw away any unused medication after the expiration date. NOTE: This sheet is a summary. It may not  cover all possible information. If you have questions about this medicine, talk to your doctor, pharmacist, or health care provider.  2024 Elsevier/Gold Standard (2022-11-04 00:00:00)  Tipiramate Topiramate Tablets What is this medication? TOPIRAMATE (toe PYRE a mate) prevents and controls seizures in people with epilepsy. It may also be used to prevent migraine headaches. It works by calming overactive nerves in your body. This medicine may be used for other purposes; ask your health care provider or pharmacist if you have questions. COMMON BRAND NAME(S): Topamax, Topiragen What should I tell my care team before I take this medication? They need to know if you have any of these conditions: Bleeding disorder Kidney disease Lung disease Suicidal thoughts, plans, or attempt by you or a family member An unusual or allergic reaction to topiramate, other medications, foods, dyes, or preservatives Pregnant or trying to get pregnant Breast-feeding How should I use this medication? Take this medication by mouth with water. Take it as directed on the prescription label at the same time every day. Do not cut, crush or chew this medicine. Swallow the tablets whole. You can take it with or without food. If it upsets your stomach, take it with food. Keep taking it unless your care team tells you to stop. A special MedGuide will be given to you by the pharmacist with each prescription and refill. Be sure to read this information carefully each time. Talk to your care team about the use of this medication in children. While it may be prescribed for children as young as 2 years for selected conditions, precautions do apply. Overdosage: If you think you have taken too much of this medicine contact a poison control center or emergency room at once. NOTE: This medicine is only for you. Do not share this medicine with others. What if I miss a dose? If you miss a dose, take it as soon as you can unless it is  within 6 hours of the next dose. If it is within 6 hours of the next dose, skip the missed dose. Take the next dose at the normal time. Do not take double or extra doses. What may interact with this medication? Acetazolamide Alcohol Antihistamines for allergy, cough, and cold Aspirin and aspirin-like medications Atropine Certain medications for anxiety or sleep Certain medications for bladder problems, such as oxybutynin, tolterodine Certain medications for depression, such as amitriptyline, fluoxetine, sertraline Certain medications for Parkinson disease, such as benztropine, trihexyphenidyl Certain medications for seizures, such as carbamazepine, lamotrigine, phenobarbital, phenytoin, primidone, valproic acid, zonisamide Certain medications for stomach problems, such as dicyclomine, hyoscyamine Certain medications for travel sickness, such as scopolamine Certain medications that treat or prevent  blood clots, such as warfarin, enoxaparin, dalteparin, apixaban, dabigatran, rivaroxaban Digoxin Diltiazem Estrogen and progestin hormones General anesthetics, such as halothane, isoflurane, methoxyflurane, propofol Glyburide Hydrochlorothiazide Ipratropium Lithium Medications that relax muscles Metformin NSAIDs, medications for pain and inflammation, such as ibuprofen or naproxen Opioid medications for pain Phenothiazines, such as chlorpromazine, mesoridazine, prochlorperazine, thioridazine Pioglitazone This list may not describe all possible interactions. Give your health care provider a list of all the medicines, herbs, non-prescription drugs, or dietary supplements you use. Also tell them if you smoke, drink alcohol, or use illegal drugs. Some items may interact with your medicine. What should I watch for while using this medication? Visit your care team for regular checks on your progress. Tell your care team if your symptoms do not start to get better or if they get worse. Do not  suddenly stop taking this medication. You may develop a severe reaction. Your care team will tell you how much medication to take. If your care team wants you to stop the medication, the dose may be slowly lowered over time to avoid any side effects. Wear a medical ID bracelet or chain. Carry a card that describes your condition. List the medications and doses you take on the card. This medication may affect your coordination, reaction time, or judgment. Do not drive or operate machinery until you know how this medication affects you. Sit up or stand slowly to reduce the risk of dizzy or fainting spells. Drinking alcohol with this medication can increase the risk of these side effects. This medication may cause serious skin reactions. They can happen weeks to months after starting the medication. Contact your care team right away if you notice fevers or flu-like symptoms with a rash. The rash may be red or purple and then turn into blisters or peeling of the skin. You may also notice a red rash with swelling of the face, lips, or lymph nodes in your neck or under your arms. This medication may cause thoughts of suicide or depression. This includes sudden changes in mood, behaviors, or thoughts. These changes can happen at any time but are more common in the beginning of treatment or after a change in dose. Call your care team right away if you experience these thoughts or worsening depression. This medication may slow your child's growth if it is taken for a long time at high doses. Your child's care team will monitor your child's growth. Using this medication for a long time may weaken your bones. The risk of bone fractures may be increased. Talk to your care team about your bone health. Discuss this medication with your care team if you may be pregnant. Serious birth defects can occur if you take this medication during pregnancy. There are benefits and risks to taking medications during pregnancy. Your care  team can help you find the option that works for you. Contraception is recommended while taking this medication. Estrogen and progestin hormones may not work as well while you are taking this medication. Your care team can help you find the option that works for you. Talk to your care team before breastfeeding. Changes to your treatment plan may be needed. What side effects may I notice from receiving this medication? Side effects that you should report to your care team as soon as possible: Allergic reactions--skin rash, itching, hives, swelling of the face, lips, tongue, or throat High acid level--trouble breathing, unusual weakness or fatigue, confusion, headache, fast or irregular heartbeat, nausea, vomiting High ammonia level--unusual weakness or  fatigue, confusion, loss of appetite, nausea, vomiting, seizures Fever that does not go away, decrease in sweat Kidney stones--blood in the urine, pain or trouble passing urine, pain in the lower back or sides Redness, blistering, peeling or loosening of the skin, including inside the mouth Sudden eye pain or change in vision such as blurry vision, seeing halos around lights, vision loss Thoughts of suicide or self-harm, worsening mood, feelings of depression Side effects that usually do not require medical attention (report to your care team if they continue or are bothersome): Burning or tingling sensation in hands or feet Difficulty with paying attention, memory, or speech Dizziness Drowsiness Fatigue Loss of appetite with weight loss Slow or sluggish movements of the body This list may not describe all possible side effects. Call your doctor for medical advice about side effects. You may report side effects to FDA at 1-800-FDA-1088. Where should I keep my medication? Keep out of the reach of children and pets. Store between 15 and 30 degrees C (59 and 86 degrees F). Protect from moisture. Keep the container tightly closed. Get rid of any  unused medication after the expiration date. To get rid of medications that are no longer needed or have expired: Take the medication to a medication take-back program. Check with your pharmacy or law enforcement to find a location. If you cannot return the medication, check the label or package insert to see if the medication should be thrown out in the garbage or flushed down the toilet. If you are not sure, ask your care team. If it is safe to put it in the trash, empty the medication out of the container. Mix the medication with cat litter, dirt, coffee grounds, or other unwanted substance. Seal the mixture in a bag or container. Put it in the trash. NOTE: This sheet is a summary. It may not cover all possible information. If you have questions about this medicine, talk to your doctor, pharmacist, or health care provider.  2024 Elsevier/Gold Standard (2022-04-09 00:00:00)

## 2023-07-23 ENCOUNTER — Telehealth: Payer: Self-pay

## 2023-07-23 ENCOUNTER — Other Ambulatory Visit (HOSPITAL_COMMUNITY): Payer: Self-pay

## 2023-07-23 NOTE — Telephone Encounter (Signed)
Pharmacy Patient Advocate Encounter   Received notification from CoverMyMeds that prior authorization for AJOVY (fremanezumab-vfrm) injection 225MG /1.5ML auto-injectors is required/requested.   Insurance verification completed.   The patient is insured through [facility] MEDICAID .   Per test claim: PA required; PA submitted to [facility] MEDICAID via CoverMyMeds Key/confirmation #/[id number] Status is pending

## 2023-07-26 ENCOUNTER — Other Ambulatory Visit (HOSPITAL_COMMUNITY): Payer: Self-pay

## 2023-07-26 DIAGNOSIS — Q048 Other specified congenital malformations of brain: Secondary | ICD-10-CM | POA: Insufficient documentation

## 2023-07-26 NOTE — Telephone Encounter (Signed)
Pharmacy Patient Advocate Encounter  Received notification from [facility] MEDICAID that Prior Authorization for AJOVY (fremanezumab-vfrm) injection 225MG /1.5ML auto-injectors has been APPROVED from 07/23/2023 to 10/23/2023. Ran test claim, Copay is $4.00. This test claim was processed through [facility]- copay amounts may vary at other pharmacies due to pharmacy/plan contracts, or as the patient moves through the different stages of their insurance plan.   PA #/Case ID/Reference #: PA Case ID #: [id number]

## 2023-08-16 ENCOUNTER — Encounter (INDEPENDENT_AMBULATORY_CARE_PROVIDER_SITE_OTHER): Payer: Self-pay | Admitting: Adult Health

## 2023-08-16 ENCOUNTER — Ambulatory Visit (INDEPENDENT_AMBULATORY_CARE_PROVIDER_SITE_OTHER): Payer: Medicaid Other | Admitting: Adult Health

## 2023-08-16 ENCOUNTER — Telehealth (INDEPENDENT_AMBULATORY_CARE_PROVIDER_SITE_OTHER): Payer: Self-pay

## 2023-08-16 ENCOUNTER — Other Ambulatory Visit (HOSPITAL_COMMUNITY): Payer: Self-pay

## 2023-08-16 VITALS — BP 111/74 | HR 76 | Temp 98.7°F | Ht 63.0 in | Wt 200.0 lb

## 2023-08-16 DIAGNOSIS — E669 Obesity, unspecified: Secondary | ICD-10-CM | POA: Diagnosis not present

## 2023-08-16 DIAGNOSIS — Z6835 Body mass index (BMI) 35.0-35.9, adult: Secondary | ICD-10-CM | POA: Diagnosis not present

## 2023-08-16 DIAGNOSIS — E559 Vitamin D deficiency, unspecified: Secondary | ICD-10-CM | POA: Diagnosis not present

## 2023-08-16 DIAGNOSIS — R7303 Prediabetes: Secondary | ICD-10-CM

## 2023-08-16 MED ORDER — WEGOVY 0.25 MG/0.5ML ~~LOC~~ SOAJ
0.2500 mg | SUBCUTANEOUS | 0 refills | Status: DC
  Filled 2023-08-16: qty 2, 28d supply, fill #0

## 2023-08-16 NOTE — Progress Notes (Signed)
WEIGHT SUMMARY AND BIOMETRICS  Vitals Temp: 98.7 F (37.1 C) BP: 111/74 Pulse Rate: 76 SpO2: 99 %   Anthropometric Measurements Height: 5\' 3"  (1.6 m) Weight: 200 lb (90.7 kg) BMI (Calculated): 35.44 Weight at Last Visit: 209lb Weight Lost Since Last Visit: 9lb Weight Gained Since Last Visit: 0 Starting Weight: 237lb Total Weight Loss (lbs): 37 lb (16.8 kg)   Body Composition  Body Fat %: 44.8 % Fat Mass (lbs): 90 lbs Muscle Mass (lbs): 105.2 lbs Total Body Water (lbs): 78 lbs Visceral Fat Rating : 10   Other Clinical Data Fasting: no Labs: no Today's Visit #: 25 Starting Date: 03/19/20    Chief Complaint:   OBESITY Lauren Garza is here to discuss her progress with her obesity treatment plan. She is on the the Category 2 Plan and states she is following her eating plan approximately 0 % of the time. She states she is not currently exercising.   Interim History:  Wegovy Rx sent in on/about 07/13/2023  Medicaid denied Wegovy- ??? Will follow-up on Rx denial  Stress- she endorses increased anxiety/stress, r/t her relationship with her current boyfriend of 6 months  She lives with her mother, step father, and her 95 year old daughter. Lauren Garza recently applied for Section 8 Housing in [facility]- awaiting a decision.  Lauren Garza would very much like to move into her own home with her teenage daughter.   Exercise-none currently.  Subjective:   1. Vitamin D deficiency  Latest Reference Range & Units 01/12/23 10:15 06/15/23 10:17  Vitamin D, 25-Hydroxy 30.0 - 100.0 ng/mL 59.2 49.6   She is on weekly Ergocalciferol- denies N/V/Muscle Weakness  2. Prediabetes Lab Results  Component Value Date   HGBA1C 5.6 06/15/2023   HGBA1C 5.7 (H) 01/12/2023   HGBA1C 5.7 (H) 09/17/2022    Metformin intolerant, re: Yeast Injection Rybelsus intolerant, re: extreme fatigue  She denies family hx of MEN 2 or MTC She denies personal hx of pancreatitis Discussed  risks/benefits of GLP-1 therapy, re: VWUJWJ  XBJYNW Ex sent in at last [facility] on 07/31/2023 GLP-1 Rx was denied  Assessment/Plan:   1. Vitamin D deficiency Refill Vitamin D, Ergocalciferol, (DRISDOL) 1.25 MG (50000 UNIT) CAPS capsule Take 1 capsule (50,000 Units total) by mouth every 7 (seven) days. Dispense: 4 capsule, Refills: 0 ordered   2. Prediabetes Continue prescribed Cat 2 meal plan and increase daily activity.  3. Obesity, Current BMI 35.44 Start Semaglutide-Weight Management (WEGOVY) 0.25 MG/0.5ML SOAJ Inject 0.25 mg into the skin once a week. Dispense: 2 mL, Refills: 0 ordered   Lauren Garza is currently in the action stage of change. As such, her goal is to continue with weight loss efforts. She has agreed to the Category 2 Plan.   Exercise goals: All adults should avoid inactivity. Some physical activity is better than none, and adults who participate in any amount of physical activity gain some health benefits.  Behavioral modification strategies: increasing lean protein intake, decreasing simple carbohydrates, increasing vegetables, increasing water intake, no skipping meals, meal planning and cooking strategies, and planning for success.  Lauren Garza has agreed to follow-up with our clinic in 4 weeks. She was informed of the importance of frequent follow-up visits to maximize her success with intensive lifestyle modifications for her multiple health conditions.   Objective:   Blood pressure 111/74, pulse 76, temperature 98.7 F (37.1 C), height 5\' 3"  (1.6 m), weight 200 lb (90.7 kg), last menstrual period 02/11/2018, SpO2 99%. Body mass index  is 35.43 kg/m.  General: Cooperative, alert, well developed, in no acute distress. HEENT: Conjunctivae and lids unremarkable. Cardiovascular: Regular rhythm.  Lungs: Normal work of breathing. Neurologic: No focal deficits.   Lab Results  Component Value Date   CREATININE 0.73 06/05/2023   BUN 13 06/05/2023   NA 136 06/05/2023    K 3.8 06/05/2023   CL 100 06/05/2023   CO2 26 06/05/2023   Lab Results  Component Value Date   ALT 19 09/17/2022   AST 22 09/17/2022   ALKPHOS 107 09/17/2022   BILITOT 0.2 09/17/2022   Lab Results  Component Value Date   HGBA1C 5.6 06/15/2023   HGBA1C 5.7 (H) 01/12/2023   HGBA1C 5.7 (H) 09/17/2022   HGBA1C 5.4 03/18/2022   HGBA1C 5.4 01/14/2022   Lab Results  Component Value Date   INSULIN 30.9 (H) 06/15/2023   INSULIN 8.3 09/17/2022   INSULIN 10.3 03/18/2022   INSULIN 4.9 01/14/2022   INSULIN 7.0 07/31/2021   Lab Results  Component Value Date   TSH 1.470 01/05/2023   Lab Results  Component Value Date   CHOL 164 09/17/2022   HDL 67 09/17/2022   LDLCALC 86 09/17/2022   TRIG 51 09/17/2022   CHOLHDL 2.4 09/17/2022   Lab Results  Component Value Date   VD25OH 49.6 06/15/2023   VD25OH 59.2 01/12/2023   VD25OH 62.6 09/17/2022   Lab Results  Component Value Date   WBC 14.8 (H) 06/05/2023   HGB 13.1 06/05/2023   HCT 41.4 06/05/2023   MCV 84.8 06/05/2023   PLT 316 06/05/2023   No results found for: "IRON", "TIBC", "FERRITIN"  Attestation Statements:   Reviewed by clinician on day of visit: allergies, medications, problem list, medical history, surgical history, family history, social history, and previous encounter notes.  I have reviewed the above documentation for accuracy and completeness, and I agree with the above. -  Mantaj Chamberlin d. Lottie Siska, NP-C

## 2023-08-16 NOTE — Telephone Encounter (Signed)
PA submitted for [facility], waiting on a determination. 1:05 08/16/2023 KP

## 2023-08-17 NOTE — Telephone Encounter (Signed)
PA for Lauren Garza has been approved until February 13, 2024. Patient notified. 7:18 08/17/23 KP

## 2023-08-28 ENCOUNTER — Other Ambulatory Visit (HOSPITAL_COMMUNITY): Payer: Self-pay

## 2023-08-30 ENCOUNTER — Other Ambulatory Visit (HOSPITAL_COMMUNITY): Payer: Self-pay

## 2023-08-31 ENCOUNTER — Other Ambulatory Visit (HOSPITAL_COMMUNITY): Payer: Self-pay

## 2023-09-13 ENCOUNTER — Ambulatory Visit (INDEPENDENT_AMBULATORY_CARE_PROVIDER_SITE_OTHER): Payer: Medicaid Other | Admitting: Adult Health

## 2023-09-13 ENCOUNTER — Encounter (INDEPENDENT_AMBULATORY_CARE_PROVIDER_SITE_OTHER): Payer: Self-pay | Admitting: Adult Health

## 2023-09-13 VITALS — BP 106/71 | HR 73 | Temp 97.9°F | Ht 63.0 in | Wt 196.0 lb

## 2023-09-13 DIAGNOSIS — J3089 Other allergic rhinitis: Secondary | ICD-10-CM | POA: Diagnosis not present

## 2023-09-13 DIAGNOSIS — Z6834 Body mass index (BMI) 34.0-34.9, adult: Secondary | ICD-10-CM | POA: Diagnosis not present

## 2023-09-13 DIAGNOSIS — E66813 Obesity, class 3: Secondary | ICD-10-CM

## 2023-09-13 DIAGNOSIS — E559 Vitamin D deficiency, unspecified: Secondary | ICD-10-CM

## 2023-09-13 DIAGNOSIS — E669 Obesity, unspecified: Secondary | ICD-10-CM | POA: Diagnosis not present

## 2023-09-13 MED ORDER — LEVOCETIRIZINE DIHYDROCHLORIDE 5 MG PO TABS
5.0000 mg | ORAL_TABLET | Freq: Every evening | ORAL | 0 refills | Status: DC
Start: 2023-09-13 — End: 2024-04-27

## 2023-09-13 MED ORDER — VITAMIN D (ERGOCALCIFEROL) 1.25 MG (50000 UNIT) PO CAPS
50000.0000 [IU] | ORAL_CAPSULE | ORAL | 0 refills | Status: DC
Start: 2023-09-13 — End: 2023-10-14

## 2023-09-13 MED ORDER — WEGOVY 0.25 MG/0.5ML ~~LOC~~ SOAJ
0.2500 mg | SUBCUTANEOUS | 0 refills | Status: DC
  Filled 2023-09-15 – 2023-09-29 (×2): qty 2, 28d supply, fill #0

## 2023-09-13 NOTE — Progress Notes (Signed)
WEIGHT SUMMARY AND BIOMETRICS  Vitals Temp: 97.9 F (36.6 C) BP: 106/71 Pulse Rate: 73 SpO2: 99 %   Anthropometric Measurements Height: 5\' 3"  (1.6 m) Weight: 196 lb (88.9 kg) BMI (Calculated): 34.73 Weight at Last Visit: 200 lb Weight Lost Since Last Visit: 4 lb Weight Gained Since Last Visit: 0 Starting Weight: 237 lb Total Weight Loss (lbs): 41 lb (18.6 kg) Peak Weight: 267 lb   Body Composition  Body Fat %: 44 % Fat Mass (lbs): 86.2 lbs Muscle Mass (lbs): 104.2 lbs Total Body Water (lbs): 76.6 lbs Visceral Fat Rating : 10   Other Clinical Data Fasting: no Labs: no Today's Visit #: 79 Starting Date: 03/19/20    Chief Complaint:   OBESITY Lauren Garza is here to discuss her progress with her obesity treatment plan. She is on the the Category 2 Plan and states she is following her eating plan approximately 0 % of the time.  She states she is currently exercising.   Interim History:  She currently lives with her parents and her 34 year old daughter. Her 19 year old son moved out and went to live with his father. Her other son, age 37, also lives with his father.  Lauren Garza would like to move out on her own ASAP.  She reports tensions in the family in regards to Lauren Garza dating "Tahlik".  Her boyfriend recently turned 30 years. Lauren Garza currently lives with his grandmother. Lauren Garza has 5 children- youngest if 51 and oldest is 75  Hunger/appetite-she endorses suppressed appitite- she feels that this likely more attributed to stress than loading dose Wegovy   Started Wegovy 0.25mg  on/about 09/01/2023 She has had 2 doses Denies mass in neck, dysphagia, dyspepsia, persistent hoarseness, abdominal pain, or N/V/C   Hydration-she recently starting carrying a large water bottle to help her increase daily water intake  Of Note-  Latest Reference Range & Units 06/15/23 10:17  INSULIN 2.6 - 24.9 uIU/mL 30.9 (H)  (H): Data is abnormally high Subjective:   1.  Vitamin D deficiency  Latest Reference Range & Units 06/15/23 10:17  Vitamin D, 25-Hydroxy 30.0 - 100.0 ng/mL 49.6   She is currently on weekly Ergocalciferol- denies N/V/Muscle Weakness  2. Environmental and seasonal allergies She denies current URI sx's She denies tobacco/vape use  Assessment/Plan:   1. Vitamin D deficiency Refill - Vitamin D, Ergocalciferol, (DRISDOL) 1.25 MG (50000 UNIT) CAPS capsule; Take 1 capsule (50,000 Units total) by mouth every 7 (seven) days.  Dispense: 4 capsule; Refill: 0  2. Environmental and seasonal allergies Refill - levocetirizine (XYZAL) 5 MG tablet; Take 1 tablet (5 mg total) by mouth every evening.  Dispense: 90 tablet; Refill: 0  3. Obesity, Current BMI 34.7 Refill Semaglutide-Weight Management (WEGOVY) 0.25 MG/0.5ML SOAJ Inject 0.25 mg into the skin once a week. Dispense: 2 mL, Refills: 0 ordered   Lauren Garza is currently in the action stage of change. As such, her goal is to continue with weight loss efforts. She has agreed to the Category 2 Plan.   Exercise goals: For substantial health benefits, adults should do at least 150 minutes (2 hours and 30 minutes) a week of moderate-intensity, or 75 minutes (1 hour and 15 minutes) a week of vigorous-intensity aerobic physical activity, or an equivalent combination of moderate- and vigorous-intensity aerobic activity. Aerobic activity should be performed in episodes of at least 10 minutes, and preferably, it should be spread throughout the week.  Behavioral modification strategies: increasing lean protein intake, decreasing  simple carbohydrates, increasing vegetables, increasing water intake, no skipping meals, meal planning and cooking strategies, keeping healthy foods in the home, ways to avoid boredom eating, and planning for success.  Lauren Garza has agreed to follow-up with our clinic in 4 weeks. She was informed of the importance of frequent follow-up visits to maximize her success with intensive  lifestyle modifications for her multiple health conditions.   Objective:   Blood pressure 106/71, pulse 73, temperature 97.9 F (36.6 C), height 5\' 3"  (1.6 m), weight 196 lb (88.9 kg), last menstrual period 02/11/2018, SpO2 99%. Body mass index is 34.72 kg/m.  General: Cooperative, alert, well developed, in no acute distress. HEENT: Conjunctivae and lids unremarkable. Cardiovascular: Regular rhythm.  Lungs: Normal work of breathing. Neurologic: No focal deficits.   Lab Results  Component Value Date   CREATININE 0.73 06/05/2023   BUN 13 06/05/2023   NA 136 06/05/2023   K 3.8 06/05/2023   CL 100 06/05/2023   CO2 26 06/05/2023   Lab Results  Component Value Date   ALT 19 09/17/2022   AST 22 09/17/2022   ALKPHOS 107 09/17/2022   BILITOT 0.2 09/17/2022   Lab Results  Component Value Date   HGBA1C 5.6 06/15/2023   HGBA1C 5.7 (H) 01/12/2023   HGBA1C 5.7 (H) 09/17/2022   HGBA1C 5.4 03/18/2022   HGBA1C 5.4 01/14/2022   Lab Results  Component Value Date   INSULIN 30.9 (H) 06/15/2023   INSULIN 8.3 09/17/2022   INSULIN 10.3 03/18/2022   INSULIN 4.9 01/14/2022   INSULIN 7.0 07/31/2021   Lab Results  Component Value Date   TSH 1.470 01/05/2023   Lab Results  Component Value Date   CHOL 164 09/17/2022   HDL 67 09/17/2022   LDLCALC 86 09/17/2022   TRIG 51 09/17/2022   CHOLHDL 2.4 09/17/2022   Lab Results  Component Value Date   VD25OH 49.6 06/15/2023   VD25OH 59.2 01/12/2023   VD25OH 62.6 09/17/2022   Lab Results  Component Value Date   WBC 14.8 (H) 06/05/2023   HGB 13.1 06/05/2023   HCT 41.4 06/05/2023   MCV 84.8 06/05/2023   PLT 316 06/05/2023   No results found for: "IRON", "TIBC", "FERRITIN"  Attestation Statements:   Reviewed by clinician on day of visit: allergies, medications, problem list, medical history, surgical history, family history, social history, and previous encounter notes.  I have reviewed the above documentation for accuracy and  completeness, and I agree with the above. -  Pamalee Marcoe d. Dyron Kawano, NP-C

## 2023-09-14 ENCOUNTER — Encounter (INDEPENDENT_AMBULATORY_CARE_PROVIDER_SITE_OTHER): Payer: Self-pay | Admitting: Adult Health

## 2023-09-15 ENCOUNTER — Other Ambulatory Visit (HOSPITAL_COMMUNITY): Payer: Self-pay

## 2023-09-16 ENCOUNTER — Other Ambulatory Visit (HOSPITAL_COMMUNITY): Payer: Self-pay

## 2023-09-29 ENCOUNTER — Other Ambulatory Visit (HOSPITAL_COMMUNITY): Payer: Self-pay

## 2023-09-29 ENCOUNTER — Other Ambulatory Visit: Payer: Self-pay

## 2023-09-30 ENCOUNTER — Other Ambulatory Visit (HOSPITAL_COMMUNITY): Payer: Self-pay

## 2023-10-06 ENCOUNTER — Encounter: Payer: Self-pay | Admitting: Neurology

## 2023-10-12 ENCOUNTER — Ambulatory Visit (INDEPENDENT_AMBULATORY_CARE_PROVIDER_SITE_OTHER): Payer: Medicaid Other | Admitting: Adult Health

## 2023-10-14 ENCOUNTER — Other Ambulatory Visit (HOSPITAL_COMMUNITY): Payer: Self-pay

## 2023-10-14 ENCOUNTER — Encounter (INDEPENDENT_AMBULATORY_CARE_PROVIDER_SITE_OTHER): Payer: Self-pay | Admitting: Adult Health

## 2023-10-14 ENCOUNTER — Ambulatory Visit (INDEPENDENT_AMBULATORY_CARE_PROVIDER_SITE_OTHER): Payer: Medicaid Other | Admitting: Adult Health

## 2023-10-14 VITALS — BP 110/72 | HR 102 | Temp 98.6°F | Ht 63.0 in | Wt 192.0 lb

## 2023-10-14 DIAGNOSIS — Z6834 Body mass index (BMI) 34.0-34.9, adult: Secondary | ICD-10-CM | POA: Diagnosis not present

## 2023-10-14 DIAGNOSIS — E559 Vitamin D deficiency, unspecified: Secondary | ICD-10-CM

## 2023-10-14 DIAGNOSIS — R7401 Elevation of levels of liver transaminase levels: Secondary | ICD-10-CM

## 2023-10-14 DIAGNOSIS — E669 Obesity, unspecified: Secondary | ICD-10-CM | POA: Diagnosis not present

## 2023-10-14 DIAGNOSIS — E66813 Obesity, class 3: Secondary | ICD-10-CM

## 2023-10-14 MED ORDER — WEGOVY 0.5 MG/0.5ML ~~LOC~~ SOAJ
0.5000 mg | SUBCUTANEOUS | 0 refills | Status: DC
Start: 2023-10-14 — End: 2023-11-17
  Filled 2023-10-14 – 2023-10-29 (×3): qty 2, 28d supply, fill #0

## 2023-10-14 MED ORDER — VITAMIN D (ERGOCALCIFEROL) 1.25 MG (50000 UNIT) PO CAPS: 50000.0000 [IU] | ORAL_CAPSULE | ORAL | 0 refills | Status: DC

## 2023-10-14 NOTE — Progress Notes (Signed)
WEIGHT SUMMARY AND BIOMETRICS  Vitals Temp: 98.6 F (37 C) BP: 110/72 Pulse Rate: (!) 102 SpO2: 99 %   Anthropometric Measurements Height: 5\' 3"  (1.6 m) Weight: 192 lb (87.1 kg) BMI (Calculated): 34.02 Weight at Last Visit: 196 lb Weight Lost Since Last Visit: 4 lb Weight Gained Since Last Visit: 0 Starting Weight: 237 lb Total Weight Loss (lbs): 45 lb (20.4 kg) Peak Weight: 267 lb   Body Composition  Body Fat %: 43.6 % Fat Mass (lbs): 83.8 lbs Muscle Mass (lbs): 102.8 lbs Total Body Water (lbs): 78.4 lbs Visceral Fat Rating : 10   Other Clinical Data Fasting: no Labs: no Today's Visit #: 34 Starting Date: 03/19/20    Chief Complaint:   OBESITY Lauren Garza is here to discuss her progress with her obesity treatment plan. She is on the the Category 2 Plan and states she is following her eating plan approximately 0 % of the time.  She states she is not currently exercising.   Interim History:  Lauren Garza 0.25mg  on/about 09/01/2023  She has been on loading dose for almost two months- tolerating well She is agreeable to increase to lowest mx dose of 0.5mg   She reports increased stress with her boyfriend- he has become estranged in the last several days.  She reports that her children are doing well!  Subjective:   1. Transaminitis  Latest Reference Range & Units 01/14/22 10:46 03/18/22 12:20 09/17/22 10:27  AST 0 - 40 IU/L 17 22 22   ALT 0 - 32 IU/L 16 23 19    She denies RUQ pain She denies frequent ETOH or Acetaminophen use Started Wegovy 0.25mg  on/about 09/01/2023  She has been on loading dose for almost two months- tolerating well  2. Vitamin D deficiency  Latest Reference Range & Units 06/15/23 10:17  Vitamin D, 25-Hydroxy 30.0 - 100.0 ng/mL 49.6   She is on weekly Ergocalciferol- denies N/V/Muscle Weakness She endorses stable energy levels. She is not currently exercising.  Assessment/Plan:   1. Transaminitis Continue with weight  loss efforts Continue Wegovy therapy as directed.  2. Vitamin D deficiency Refill - Vitamin D, Ergocalciferol, (DRISDOL) 1.25 MG (50000 UNIT) CAPS capsule; Take 1 capsule (50,000 Units total) by mouth every 7 (seven) days.  Dispense: 4 capsule; Refill: 0  3. Obesity, Current BMI 34.02 Refill and Increase  Semaglutide-Weight Management (WEGOVY) 0.5 MG/0.5ML SOAJ Inject 0.5 mg into the skin once a week. Dispense: 2 mL, Refills: 0 of 0 remaining   Lauren Garza is currently in the action stage of change. As such, her goal is to continue with weight loss efforts. She has agreed to the Category 2 Plan.   Exercise goals: All adults should avoid inactivity. Some physical activity is better than none, and adults who participate in any amount of physical activity gain some health benefits. Adults should also include muscle-strengthening activities that involve all major muscle groups on 2 or more days a week.  YouTube Exercise Videos  Behavioral modification strategies: increasing lean protein intake, decreasing simple carbohydrates, increasing vegetables, increasing water intake, no skipping meals, meal planning and cooking strategies, keeping healthy foods in the home, and planning for success.  Lauren Garza has agreed to follow-up with our clinic in 4 weeks. She was informed of the importance of frequent follow-up visits to maximize her success with intensive lifestyle modifications for her multiple health conditions.   Check Fasting Labs at next OV  Objective:   Blood pressure 110/72, pulse (!) 102, temperature 98.6 F (37  C), height 5\' 3"  (1.6 m), weight 192 lb (87.1 kg), last menstrual period 02/11/2018, SpO2 99%. Body mass index is 34.01 kg/m.  General: Cooperative, alert, well developed, in no acute distress. HEENT: Conjunctivae and lids unremarkable. Cardiovascular: Regular rhythm.  Lungs: Normal work of breathing. Neurologic: No focal deficits.   Lab Results  Component Value Date    CREATININE 0.73 06/05/2023   BUN 13 06/05/2023   NA 136 06/05/2023   K 3.8 06/05/2023   CL 100 06/05/2023   CO2 26 06/05/2023   Lab Results  Component Value Date   ALT 19 09/17/2022   AST 22 09/17/2022   ALKPHOS 107 09/17/2022   BILITOT 0.2 09/17/2022   Lab Results  Component Value Date   HGBA1C 5.6 06/15/2023   HGBA1C 5.7 (H) 01/12/2023   HGBA1C 5.7 (H) 09/17/2022   HGBA1C 5.4 03/18/2022   HGBA1C 5.4 01/14/2022   Lab Results  Component Value Date   INSULIN 30.9 (H) 06/15/2023   INSULIN 8.3 09/17/2022   INSULIN 10.3 03/18/2022   INSULIN 4.9 01/14/2022   INSULIN 7.0 07/31/2021   Lab Results  Component Value Date   TSH 1.470 01/05/2023   Lab Results  Component Value Date   CHOL 164 09/17/2022   HDL 67 09/17/2022   LDLCALC 86 09/17/2022   TRIG 51 09/17/2022   CHOLHDL 2.4 09/17/2022   Lab Results  Component Value Date   VD25OH 49.6 06/15/2023   VD25OH 59.2 01/12/2023   VD25OH 62.6 09/17/2022   Lab Results  Component Value Date   WBC 14.8 (H) 06/05/2023   HGB 13.1 06/05/2023   HCT 41.4 06/05/2023   MCV 84.8 06/05/2023   PLT 316 06/05/2023   No results found for: "IRON", "TIBC", "FERRITIN"  Attestation Statements:   Reviewed by clinician on day of visit: allergies, medications, problem list, medical history, surgical history, family history, social history, and previous encounter notes.  I have reviewed the above documentation for accuracy and completeness, and I agree with the above. -  Godson Pollan d. Jun Osment, NP-C

## 2023-10-15 ENCOUNTER — Other Ambulatory Visit (HOSPITAL_COMMUNITY): Payer: Self-pay

## 2023-10-29 ENCOUNTER — Other Ambulatory Visit (HOSPITAL_COMMUNITY): Payer: Self-pay

## 2023-10-29 ENCOUNTER — Other Ambulatory Visit: Payer: Self-pay

## 2023-11-01 ENCOUNTER — Other Ambulatory Visit (HOSPITAL_COMMUNITY): Payer: Self-pay

## 2023-11-01 ENCOUNTER — Other Ambulatory Visit (INDEPENDENT_AMBULATORY_CARE_PROVIDER_SITE_OTHER): Payer: Self-pay | Admitting: Adult Health

## 2023-11-01 ENCOUNTER — Encounter (INDEPENDENT_AMBULATORY_CARE_PROVIDER_SITE_OTHER): Payer: Self-pay | Admitting: Adult Health

## 2023-11-01 MED ORDER — WEGOVY 0.25 MG/0.5ML ~~LOC~~ SOAJ: 0.2500 mg | SUBCUTANEOUS | 0 refills | Status: DC

## 2023-11-08 ENCOUNTER — Telehealth: Payer: Self-pay | Admitting: Neurology

## 2023-11-08 ENCOUNTER — Other Ambulatory Visit (HOSPITAL_COMMUNITY): Payer: Self-pay

## 2023-11-08 ENCOUNTER — Encounter: Payer: Self-pay | Admitting: Neurology

## 2023-11-08 ENCOUNTER — Ambulatory Visit: Payer: Medicaid Other | Admitting: Neurology

## 2023-11-08 VITALS — BP 126/80 | HR 82 | Ht 63.0 in | Wt 190.0 lb

## 2023-11-08 DIAGNOSIS — G43709 Chronic migraine without aura, not intractable, without status migrainosus: Secondary | ICD-10-CM

## 2023-11-08 DIAGNOSIS — G43901 Migraine, unspecified, not intractable, with status migrainosus: Secondary | ICD-10-CM | POA: Diagnosis not present

## 2023-11-08 MED ORDER — METHYLPREDNISOLONE 4 MG PO TBPK: ORAL_TABLET | ORAL | 1 refills | Status: DC

## 2023-11-08 MED ORDER — KETOROLAC TROMETHAMINE 60 MG/2ML IM SOLN
60.0000 mg | Freq: Once | INTRAMUSCULAR | Status: AC
  Administered 2023-11-08: 60 mg via INTRAMUSCULAR

## 2023-11-08 NOTE — Telephone Encounter (Signed)
Submitted benefit verification, will update once results are received. [id number]

## 2023-11-08 NOTE — Progress Notes (Signed)
Toradol 60 mg IM x 1 given to pt in RUOQ of R buttock per v.o. Dr Lucia Gaskins. Pt tolerated injection well. See MAR.

## 2023-11-08 NOTE — Patient Instructions (Signed)
Start medrol dosepak Start Botox for migraines Toradol injection today  Methylprednisolone Tablets What is this medication? METHYLPREDNISOLONE (meth ill pred NISS oh lone) treats many conditions such as asthma, allergic reactions, arthritis, inflammatory bowel diseases, adrenal, and blood or bone marrow disorders. It works by decreasing inflammation, slowing down an overactive immune system, or replacing cortisol normally made in the body. Cortisol is a hormone that plays an important role in how the body responds to stress, illness, and injury. It belongs to a group of medications called steroids. This medicine may be used for other purposes; ask your health care provider or pharmacist if you have questions. COMMON BRAND NAME(S): Medrol, Medrol Dosepak What should I tell my care team before I take this medication? They need to know if you have any of these conditions: Cushing's syndrome Eye disease, vision problems Diabetes Glaucoma Heart disease High blood pressure Infection especially a viral infection, such as chickenpox, cold sores, or herpes Liver disease Mental health conditions Myasthenia gravis Osteoporosis Recent or upcoming vaccine Seizures Stomach or intestine problems Thyroid disease An unusual or allergic reaction to lactose, methylprednisolone, other medications, foods, dyes, or preservatives Pregnant or trying to get pregnant Breastfeeding How should I use this medication? Take this medication by mouth with a glass of water. Follow the directions on the prescription label. Take this medication with food. If you are taking this medication once a day, take it in the morning. Do not take it more often than directed. Do not suddenly stop taking your medication because you may develop a severe reaction. Your care team will tell you how much medication to take. If your care team wants you to stop the medication, the dose may be slowly lowered over time to avoid any side  effects. Talk to your care team about the use of this medication in children. Special care may be needed. Overdosage: If you think you have taken too much of this medicine contact a poison control center or emergency room at once. NOTE: This medicine is only for you. Do not share this medicine with others. What if I miss a dose? If you miss a dose, take it as soon as you can. If it is almost time for your next dose, talk to your care team. You may need to miss a dose or take an extra dose. Do not take double or extra doses without advice. What may interact with this medication? Do not take this medication with any of the following: Alefacept Echinacea Live virus vaccines Metyrapone Mifepristone This medication may also interact with the following: Amphotericin B Aspirin and aspirin-like medications Certain antibiotics, such as erythromycin, clarithromycin, troleandomycin Certain medications for diabetes Certain medications for fungal infections, such as ketoconazole Certain medications for seizures, such as carbamazepine, phenobarbital, phenytoin Certain medications that treat or prevent blood clots, such as warfarin Cholestyramine Cyclosporine Digoxin Diuretics Estrogen or progestin hormones Isoniazid NSAIDs, medications for pain and inflammation, such as ibuprofen or naproxen Other medications for myasthenia gravis Rifampin Vaccines This list may not describe all possible interactions. Give your health care provider a list of all the medicines, herbs, non-prescription drugs, or dietary supplements you use. Also tell them if you smoke, drink alcohol, or use illegal drugs. Some items may interact with your medicine. What should I watch for while using this medication? Tell your care team if your symptoms do not start to get better or if they get worse. Do not stop taking except on your care team's advice. You may develop a severe  reaction. Your care team will tell you how much  medication to take. This medication may increase your risk of getting an infection. Tell your care team if you are around anyone with measles or chickenpox, or if you develop sores or blisters that do not heal properly. This medication may increase blood sugar levels. Ask your care team if changes in diet or medications are needed if you have diabetes. Tell your care team right away if you have any change in your eyesight. Using this medication for a long time may increase your risk of low bone mass. Talk to your care team about bone health. What side effects may I notice from receiving this medication? Side effects that you should report to your care team as soon as possible: Allergic reactions--skin rash, itching, hives, swelling of the face, lips, tongue, or throat Cushing syndrome--increased fat around the midsection, upper back, neck, or face, pink or purple stretch marks on the skin, thinning, fragile skin that easily bruises, unexpected hair growth High blood sugar (hyperglycemia)--increased thirst or amount of urine, unusual weakness or fatigue, blurry vision Increase in blood pressure Infection--fever, chills, cough, sore throat, wounds that don't heal, pain or trouble when passing urine, general feeling of discomfort or being unwell Low adrenal gland function--nausea, vomiting, loss of appetite, unusual weakness or fatigue, dizziness Mood and behavior changes--anxiety, nervousness, confusion, hallucinations, irritability, hostility, thoughts of suicide or self-harm, worsening mood, feelings of depression Stomach bleeding--bloody or black, tar-like stools, vomiting blood or brown material that looks like coffee grounds Swelling of the ankles, hands, or feet Side effects that usually do not require medical attention (report to your care team if they continue or are bothersome): Acne General discomfort and fatigue Headache Increase in appetite Nausea Trouble sleeping Weight gain This  list may not describe all possible side effects. Call your doctor for medical advice about side effects. You may report side effects to FDA at 1-800-FDA-1088. Where should I keep my medication? Keep out of the reach of children and pets. Store at room temperature between 20 and 25 degrees C (68 and 77 degrees F). Throw away any unused medication after the expiration date. NOTE: This sheet is a summary. It may not cover all possible information. If you have questions about this medicine, talk to your doctor, pharmacist, or health care provider.  2024 [employer] Standard (2022-07-15 00:00:00)   OnabotulinumtoxinA Injection (Medical Use) What is this medication? ONABOTULINUMTOXINA (o na BOTT you lye num tox in eh) treats severe muscle spasms. It may also be used to prevent migraine headaches. It can treat excessive sweating when other medications do not work well enough. This medicine may be used for other purposes; ask your health care provider or pharmacist if you have questions. COMMON BRAND NAME(S): Botox What should I tell my care team before I take this medication? They need to know if you have any of these conditions: Breathing problems Cerebral palsy spasms Difficulty urinating Heart problems History of surgery where this medication is going to be used Infection at the site where this medication is going to be used Myasthenia gravis or other neurologic disease Nerve or muscle disease Surgery plans Take medications that treat or prevent blood clots Thyroid problems An unusual or allergic reaction to botulinum toxin, albumin, other medications, foods, dyes, or preservatives Pregnant or trying to get pregnant Breast-feeding How should I use this medication? This medication is for injected into a muscle. It is given by your care team in a hospital or clinic setting.  A special MedGuide will be given to you before each treatment. Be sure to read this information carefully each  time. Talk to your care team about the use of this medication in children. While this medication may be prescribed for children as young as 2 years for selected conditions, precautions do apply. Overdosage: If you think you have taken too much of this medicine contact a poison control center or emergency room at once. NOTE: This medicine is only for you. Do not share this medicine with others. What if I miss a dose? This does not apply. What may interact with this medication? Aminoglycoside antibiotics, such as gentamicin, neomycin, tobramycin Muscle relaxants Other botulinum toxin injections This list may not describe all possible interactions. Give your health care provider a list of all the medicines, herbs, non-prescription drugs, or dietary supplements you use. Also tell them if you smoke, drink alcohol, or use illegal drugs. Some items may interact with your medicine. What should I watch for while using this medication? Visit your care team for regular check ups. This medication will cause weakness in the muscle where it is injected. Tell your care team if you feel unusually weak in other muscles. Get medical help right away if you have problems with breathing, swallowing, or talking. This medication might make your eyelids droop or make you see blurry or double. If you have weak muscles or trouble seeing do not drive a car, use machinery, or do other dangerous activities. This medication contains albumin from human blood. It may be possible to pass an infection in this medication, but no cases have been reported. Talk to your care team about the risks and benefits of this medication. If your activities have been limited by your condition, go back to your regular routine slowly after treatment with this medication. What side effects may I notice from receiving this medication? Side effects that you should report to your care team as soon as possible: Allergic reactions--skin rash, itching,  hives, swelling of the face, lips, tongue, or throat Dryness or irritation of the eyes, eye pain, change in vision, sensitivity to light Infection--fever, chills, cough, sore throat, wounds that don't heal, pain or trouble when passing urine, general feeling of discomfort or being unwell Spread of botulinum toxin effects--unusual weakness or fatigue, blurry or double vision, trouble swallowing, hoarseness or trouble speaking, trouble breathing, loss of bladder control Trouble passing urine Side effects that usually do not require medical attention (report these to your care team if they continue or are bothersome): Dry mouth Eyelid drooping Fatigue Headache Pain, redness, or irritation at injection site This list may not describe all possible side effects. Call your doctor for medical advice about side effects. You may report side effects to FDA at 1-800-FDA-1088. Where should I keep my medication? This medication is given in a hospital or clinic and will not be stored at home. NOTE: This sheet is a summary. It may not cover all possible information. If you have questions about this medicine, talk to your doctor, pharmacist, or health care provider.  2024 [employer] Standard (2021-11-13 00:00:00)

## 2023-11-08 NOTE — Progress Notes (Signed)
[facility]    Provider:  Dr Lucia Gaskins Requesting Provider: [name], MD Primary Care Provider:  [name], MD  CC: Migraines  11/08/2023;   Lost 47 pounds. OP was only 22. Topamax helping but still having headaches and migraines. She has pulsating/pounding/throbbing headaches, light sensitivity, sound sensitivity, nausea, last up 24 hours, unilateral left temple, movement makes it worse, moderate to severe, no aura, no medication overuse. 3 injections of Ajovy, LP did not improve symptoms. 12 migraine days a month and > 20 total headache days a month. Failed multiple medications including Ajovy. Ajovy not helpong all that much.   From a thorough review of records, medications tried that can be used in migraine management > 3 months include: Tylenol, Benadryl, Decadron, Topamax, propranolol contraindicated due to asthma, blood pressure medications contrindicated due to hypotension, ibuprofen, ketorolac, lisinopril(had hypotension), Robaxin, Reglan, Zofran, Phenergan, nurtec, propranolol, Has asthma cannot try b-blockers again, rizatriptan(side effects), Ubrelvy (not work), topamax(currently on), amoitriptline/notriptyline, Aimovig containdicated due to constipation. Ajovy 4 months.    11/08/2023: IMPRESSION:  This HST did not confirm the presence of of a clinically significant degree of sleep apnea nor of hypoxia.  Sleep was not fragmented and there was a high percentage of REM sleep recorded, these are criteria for normal sleep architecture.   I cannot correlate the patient's headaches to her sleep breathing or sleep architecture.  Evident is a higher RDI, also known as respiratory disturbance index, then AHI (apnea hypopnea index), indicating that snoring or upper airway resistance may contribute to less restorative or refreshing sleep.   RECOMMENDATION: This patients excessive daytime sleepiness should be further evaluated by narcolepsy HLA testing if she reports vivid  dreams, sleep paralysis, hypnopompic hallucinations, or cataplectic spells- and only then possibly followed by a PSG with MSLT.   MRi brain 01/2023: The brain appears normal before and after contrast. 3 to 4 mm of cerebellar ectopia, not enough to be considered a Chiari malformation. Partially empty sella turcica.  This is often an incidental finding but could also be seen with elevated intracranial pressure. Remote sinus surgery and current chronic sphenoid, maxillary and ethmoid sinusitis Normal enhancement pattern.  CTV 05/2023: PRESSION: 1. Partial empty sella and narrowing of the distal transverse sinuses near the transverse-sigmoid junction, which can be seen in the setting of idiopathic intracranial hypertension. 2. No evidence of dural venous sinus thrombosis. 3. Moderate mucosal thickening in the paranasal sinuses, with an air-fluid level in the left maxillary sinus. Correlate for symptoms of acute sinusitis.  OP slightly elevated but no papilledema and taking fluid out did not help also on Topiramate.     HPI 07/22/2023: Here for new concern headaches, past medical history bursitis of both hips, GERD, acute reaction to stress, allergies, chronic sinusitis, transaminitis, insomnia, vitamin B12 deficiency, hyperlipidemia, morbid obesity, asthma, meralgia paresthetica, kidney stones, hysterectomy, generalized headaches.   She is here alone. She has had sporadic headaches in the past but never sever headaches, a few months ago, she went and saw Dr. Jenne Pane and he said not sinuses or allergies, nurtec did not help, nausea, on both temples, pulsating/pounding/throbbing, light and sound sensitivity, she layed down in a quiet room that helped, moving made it worse. 2nd migraines she has ever had. Lasted for days. Nothing helped, waxed and wained for days. She went to the ED, given imitrex, reglan, toradol and took a while but she felt better. Maternal aunt had headaches but had a brain aneurysm,  no pulsatile tinnitus. She can wake up  with headaches, no known snoring, doesn't feel refereshed, she may doze off sometimes and she can sit at a red light and fall asleep. Blurry vision, worse in the morning and laying down, hurts to move, new headache, moderate to severe. No other focal neurologic deficits, associated symptoms, inciting events or modifiable factors.  Daily headaches. 15 migraine days a month. Last 24 hours.   Reviewed notes, labs and imaging from outside physicians, which showed:   MRI brain 01/2023:  IMPRESSION:  personally reviewed outside of appointment, extra 10 minutes The brain appears normal before and after contrast. 3 to 4 mm of cerebellar ectopia, not enough to be considered a Chiari malformation. Partially empty sella turcica.  This is often an incidental finding but could also be seen with elevated intracranial pressure. Remote sinus surgery and current chronic sphenoid, maxillary and ethmoid sinusitis Normal enhancement pattern.   LP 05/31/2023: IMPRESSION: Lumbar puncture on the right at L2-3. Elevated opening pressure at 22 cm of water.   CT head 2023: COMPARISON:  Brain CT 10/16/2010   FINDINGS: Brain: No evidence of acute infarction, hemorrhage, hydrocephalus, extra-axial collection or mass lesion/mass effect.   Vascular: No hyperdense vessel or unexpected calcification.   Skull: Intact.   Sinuses/Orbits: Post sinus surgery. Mucosal thickening involving the paranasal sinuses. No air-fluid levels. Mastoid air cells are unremarkable.   Other: None   IMPRESSION: 1. No acute intracranial abnormalities.      Latest Ref Rng & Units 06/05/2023   10:04 AM 11/07/2020    9:15 AM 03/19/2020   11:40 AM  CBC  WBC 4.0 - 10.5 K/uL 14.8  9.1  9.2   Hemoglobin 12.0 - 15.0 g/dL 16.1  09.6  04.5   Hematocrit 36.0 - 46.0 % 41.4  41.9  40.9   Platelets 150 - 400 K/uL 316  311  345       Latest Ref Rng & Units 06/05/2023   10:04 AM 01/05/2023    8:37 AM  09/17/2022   10:27 AM  CMP  Glucose 70 - 99 mg/dL 92  75  82   BUN 6 - 20 mg/dL 13  13  11    Creatinine 0.44 - 1.00 mg/dL 4.09  8.11  9.14   Sodium 135 - 145 mmol/L 136  138  142   Potassium 3.5 - 5.1 mmol/L 3.8  4.7  4.8   Chloride 98 - 111 mmol/L 100  100  103   CO2 22 - 32 mmol/L 26  24  26    Calcium 8.9 - 10.3 mg/dL 9.6  9.7  9.0   Total Protein 6.0 - 8.5 g/dL   7.0   Total Bilirubin 0.0 - 1.2 mg/dL   0.2   Alkaline Phos 44 - 121 IU/L   107   AST 0 - 40 IU/L   22   ALT 0 - 32 IU/L   19      CC:  Thigh pain  HPI 01/16/2020:  Lauren Garza is a 42 y.o. female here as requested by [name], MD for pain in the right thigh, merlgi paresthetica, severe obesity BMI of 40-44. PMHx plantar fasciitis, hypertension, frequent headaches, asthma, kidney stones, hysterectomy.  I reviewed Tirraney Osborne's notes, patient came in for pain in her thighs, bilateral thigh pain, been ongoing at least several months but stable, thighs are very tender to touch, she has not seen PT or chiropractor, she is obese and asked for phentermine, thigh pain positive for numbness, on examination tenderness to palpation  about her thighs bilaterally, she was given a shot of Depo-Medrol, she was started on gabapentin, suspicion for meralgia paresthetica, weight loss was recommended for her severe obesity class III.  Symptoms started years ago with numbness in the outer thighs. No inciting events, no accidents or trauma or anything that preceded. In October/Novemebr of last year she was seen for plantar fasciitis and she was walking differently and the pain in the thigh worsened it is burning, throbbing, severe, radiating, in the lateral thigh area, doesn't move, doesn't radiate past the knees, no low back pain associated, symmetric and bilateral, rolling over in bed makes it worse. tanding for long periods of time make it worse. Sitting makes it worse. Can be severe. Also buttocks pain. She went to physical therapy  and it did not help. She has had insurance issues. Gabapentin doesn't help. This is everyday, severe, burning, nothing helps, comes and goes all day. She is going to the bathroom more, feels stream is weaker. No other focal neurologic deficits, associated symptoms, inciting events or modifiable factors.No abdominal pain.   Reviewed notes, labs and imaging from outside physicians, which showed:  CT Abdomen Pelvis 02/06/2018 reviewed report: 1. Status post hysterectomy. Edema and ill-defined complex fluid within the pelvis, likely due to postoperative chronic hemorrhage/hematoma. No drainable abscess. 2. No bowel obstruction or other acute complication. 3.  Possible constipation. 4. Mild bladder wall thickening could represent cystitis. 5. Left nephrolithiasis.    Review of Systems: Patient complains of symptoms per HPI as well as the following symptoms: weight gain. Pertinent negatives and positives per HPI. All others negative.   Social History   Socioeconomic History   Marital status: Divorced    Spouse name: Not on file   Number of children: 3   Years of education: Not on file   Highest education level: 11th grade  Occupational History   Occupation: Chief Technology Officer  Tobacco Use   Smoking status: Former   Smokeless tobacco: Never   Tobacco comments:    rare smoker 9 years ago  Psychologist, educational Use   Vaping status: Never Used  Substance and Sexual Activity   Alcohol use: Yes    Comment: Rare, 1x year   Drug use: No   Sexual activity: Yes    Birth control/protection: Surgical  Other Topics Concern   Not on file  Social History Narrative   Lives at home with her children and parents    Right handed   Caffeine: coffee, 1 cup/day   Social Determinants of Corporate investment banker Strain: Not on file  Food Insecurity: Not on file  Transportation Needs: Not on file  Physical Activity: Not on file  Stress: Not on file  Social Connections: Not on file  Intimate Partner Violence:  Not on file    Family History  Problem Relation Age of Onset   Hypertension Mother    Diabetes Mother    Other Mother        questionable nerve issue, has numbness in thighs too   Sleep apnea Mother    Obesity Mother    Heart attack Father    Headache Paternal Aunt    Diabetes Maternal Grandmother     Past Medical History:  Diagnosis Date   Abnormal Pap smear    Acid reflux    Allergies    Asthma    Bacterial vaginosis    Bursitis    Chlamydia trachomatis infection 2012   Constipation    Depression    DUB (  dysfunctional uterine bleeding)    Family history of adverse reaction to anesthesia    mom has n and V past surgery   Frequent headaches    migraines   Hypertension    "not anymore", taken off BP meds per pt report   Meralgia paresthetica    Plantar fasciitis    right     Patient Active Problem List   Diagnosis Date Noted   Cerebellar tonsillar ectopia (HCC) 07/26/2023   Class 2 drug-induced obesity with serious comorbidity and body mass index (BMI) of 37.0 to 37.9 in adult 01/27/2023   Dry mouth and eyes 01/27/2023   Non-restorative sleep 01/27/2023   Chronic migraine without aura without status migrainosus, not intractable 01/27/2023   Morning headache 01/27/2023   Excessive daytime sleepiness 01/27/2023   Hematochezia 10/05/2022   Greater trochanteric bursitis of both hips 07/16/2022   Gastroesophageal reflux disease 07/16/2022   Acute reaction to stress 06/19/2022   Environmental and seasonal allergies 09/15/2021   At risk for side effect of medication 04/01/2021   Chronic sinusitis 02/25/2021   Transaminitis 02/11/2021   History of tooth extraction 12/23/2020   Insomnia 12/23/2020   Hemorrhagic ovarian cyst left 11/12/2020   SOB (shortness of breath) on exertion 09/26/2020   Vitamin B12 deficiency 08/19/2020   Other hyperlipidemia, pure 08/19/2020   Vitamin D deficiency 05/21/2020   Asthma 05/21/2020   Insulin resistance 05/06/2020   Meralgia  paresthetica of both lower extremities 01/16/2020   Class 3 severe obesity with serious comorbidity and body mass index (BMI) of 40.0 to 44.9 in adult [facility]) 01/16/2020   Abnormal laboratory test 03/09/2018   Kidney stone on left side 02/06/2018   S/P vaginal hysterectomy 02/01/2018   Generalized headaches 10/25/2017    Past Surgical History:  Procedure Laterality Date   GYNECOLOGIC CRYOSURGERY     I & D EXTREMITY Right 04/04/2015   Procedure: IRRIGATION AND DEBRIDEMENT RIGHT WRIST;  Surgeon: Betha Loa, MD;  Location: [facility] OR;  Service: Orthopedics;  Laterality: Right;   SINUS ENDO WITH FUSION N/A 01/25/2017   Procedure: SINUS ENDO WITH FUSION;  Surgeon: Christia Reading, MD;  Location: [facility];  Service: ENT;  Laterality: N/A;   TUBAL LIGATION     VAGINAL HYSTERECTOMY Bilateral 02/01/2018   Procedure: HYSTERECTOMY VAGINAL WITH SALPINGECTOMY;  Surgeon: Hermina Staggers, MD;  Location: [facility];  Service: Gynecology;  Laterality: Bilateral;   WISDOM TOOTH EXTRACTION     WRIST SURGERY      Current Outpatient Medications  Medication Sig Dispense Refill   albuterol (ACCUNEB) 0.63 MG/3ML nebulizer solution Take by nebulization.     Boric Acid Vaginal 600 MG SUPP INSERT 1 SUPPOSITORY VAGINALLY AT BEDTIME. FOR VAGINAL USE     cyanocobalamin (,VITAMIN B-12,) 1000 MCG/ML injection Inject 1,000 mcg into the skin every 30 (thirty) days.     fluconazole (DIFLUCAN) 150 MG tablet Take 150 mg by mouth daily.     Fremanezumab-vfrm (AJOVY) 225 MG/1.5ML SOAJ Inject 225 mg into the skin every 30 (thirty) days. 1 mL 11   Fremanezumab-vfrm (AJOVY) 225 MG/1.5ML SOAJ Inject 225 mg into the skin every 30 (thirty) days.     levocetirizine (XYZAL) 5 MG tablet Take 1 tablet (5 mg total) by mouth every evening. 90 tablet 0   methylPREDNISolone (MEDROL DOSEPAK) 4 MG TBPK tablet Take pills daily all together with food. Take the first dose (6 pills) as soon as possible. Take the rest each morning. For 6 days  total 6-5-4-3-2-1. 21  tablet 1   montelukast (SINGULAIR) 10 MG tablet SMARTSIG:1 Tablet(s) By Mouth Every Evening     ondansetron (ZOFRAN-ODT) 4 MG disintegrating tablet Take 1-2 tablets (4-8 mg total) by mouth every 8 (eight) hours as needed. 30 tablet 3   Semaglutide-Weight Management (WEGOVY) 0.25 MG/0.5ML SOAJ Inject 0.25 mg into the skin once a week. 2 mL 0   Semaglutide-Weight Management (WEGOVY) 0.5 MG/0.5ML SOAJ Inject 0.5 mg into the skin once a week. 2 mL 0   SYMBICORT 80-4.5 MCG/ACT inhaler SMARTSIG:2 Puff(s) By Mouth Twice Daily     topiramate (TOPAMAX) 100 MG tablet Take 1.5 pills(150,g) for 2 weeks at bedtime and then increase to 200mg  (2 tabes) at bedtime 180 tablet 3   Vitamin D, Ergocalciferol, (DRISDOL) 1.25 MG (50000 UNIT) CAPS capsule Take 1 capsule (50,000 Units total) by mouth every 7 (seven) days. 4 capsule 0   No current facility-administered medications for this visit.    Allergies as of 11/08/2023 - Review Complete 11/08/2023  Allergen Reaction Noted   Ceftin [cefuroxime]  05/01/2021   Clindamycin/lincomycin Hives 08/15/2011   Dust mite extract  01/26/2018   Flagyl [metronidazole hcl] Hives 08/15/2011   Lincomycin hcl Hives 08/15/2011   Metformin and related  04/01/2021    Vitals: BP 126/80   Pulse 82   Ht 5\' 3"  (1.6 m)   Wt 190 lb (86.2 kg)   LMP 02/11/2018 (Exact Date)   BMI 33.66 kg/m  Last Weight:  Wt Readings from Last 1 Encounters:  11/08/23 190 lb (86.2 kg)   Last Height:   Ht Readings from Last 1 Encounters:  11/08/23 5\' 3"  (1.6 m)  Physical exam: Exam: Gen: NAD, conversant, well nourised, obese, well groomed                     CV: RRR, no MRG. No Carotid Bruits. No peripheral edema, warm, nontender Eyes: Conjunctivae clear without exudates or hemorrhage  Neuro: Detailed Neurologic Exam  Speech:    Speech is normal; fluent and spontaneous with normal comprehension.  Cognition:    The patient is oriented to person, place, and time;      recent and remote memory intact;     language fluent;     normal attention, concentration,     fund of knowledge Cranial Nerves:    The pupils are equal, round, and reactive to light. The fundi are normal and spontaneous venous pulsations are present. Visual fields are full to finger confrontation. Extraocular movements are intact. Trigeminal sensation is intact and the muscles of mastication are normal. The face is symmetric. The palate elevates in the midline. Hearing intact. Voice is normal. Shoulder shrug is normal. The tongue has normal motion without fasciculations.   Coordination:    Normal finger to nose and heel to shin. Normal rapid alternating movements.   Gait:    Heel-toe and tandem gait are normal.   Motor Observation:    No asymmetry, no atrophy, and no involuntary movements noted. Tone:    Normal muscle tone.    Posture:    Posture is normal. normal erect    Strength:    Strength is V/V in the upper and lower limbs.      Sensation: intact to LT     Reflex Exam:  DTR's:    Deep tendon reflexes in the upper and lower extremities are normal bilaterally.   Toes:    The toes are downgoing bilaterally.   Clonus:    Clonus is absent.  Assessment/Plan: 42 year old with Chronic migraines.  Increase topirLost 47 pounds. OP was only 22. Topamax helping but still having headaches and migraines. She has pulsating/pounding/throbbing headaches, light sensitivity, sound sensitivity, nausea, last up 24 hours, moderate to severe, unilateral left temple, movement makes it worse, moderate to severe, no aura, no medication overuse. 3 injections of Ajovy, LP did not improve symptoms(OP22 and now on topiramate and losing weight on wegovy and [facility]). 12 migraine days a month and > 20 total headache days a month for > 6 months. Failed multiple medications including Ajovy. Ajovy not helpong all that much.   From a thorough review of records, medications tried that can be used in  migraine management > 3 months include: Cymbalta, Tylenol, Benadryl, Decadron, Topamax, propranolol contraindicated due to asthma, blood pressure medications contrindicated due to hypotension, ibuprofen, ketorolac, lisinopril(had hypotension), Robaxin, Reglan, Zofran, Phenergan, nurtec, propranolol, Has asthma cannot try b-blockers again, rizatriptan(side effects), Ubrelvy (not work), topamax, amoitriptline/notriptyline, Aimovig containdicated due to constipation. Ajovy.   OPTIONS: Botox(every 3 months), Change to Emgality once monthly injection, Try Vyepti (every 3 month infusion), Qulipta pill daily, Increase Topiramate, declines neurosurgery eval for chiari.    11/08/2023: IMPRESSION:  This HST did not confirm the presence of of a clinically significant degree of sleep apnea nor of hypoxia.  Sleep was not fragmented and there was a high percentage of REM sleep recorded, these are criteria for normal sleep architecture.   I cannot correlate the patient's headaches to her sleep breathing or sleep architecture.  Evident is a higher RDI, also known as respiratory disturbance index, then AHI (apnea hypopnea index), indicating that snoring or upper airway resistance may contribute to less restorative or refreshing sleep.   RECOMMENDATION: This patients excessive daytime sleepiness should be further evaluated by narcolepsy HLA testing if she reports vivid dreams, sleep paralysis, hypnopompic hallucinations, or cataplectic spells- and only then possibly followed by a PSG with MSLT.   MRi brain 01/2023: The brain appears normal before and after contrast. 3 to 4 mm of cerebellar ectopia, not enough to be considered a Chiari malformation. Partially empty sella turcica.  This is often an incidental finding but could also be seen with elevated intracranial pressure. Remote sinus surgery and current chronic sphenoid, maxillary and ethmoid sinusitis Normal enhancement pattern.  CTV 05/2023: PRESSION: 1. Partial  empty sella and narrowing of the distal transverse sinuses near the transverse-sigmoid junction, which can be seen in the setting of idiopathic intracranial hypertension. 2. No evidence of dural venous sinus thrombosis. 3. Moderate mucosal thickening in the paranasal sinuses, with an air-fluid level in the left maxillary sinus. Correlate for symptoms of acute sinusitis.  OP slightly elevated but no papilledema and taking fluid out did not help also on Topiramate.    No orders of the defined types were placed in this encounter.  Meds ordered this encounter  Medications   methylPREDNISolone (MEDROL DOSEPAK) 4 MG TBPK tablet    Sig: Take pills daily all together with food. Take the first dose (6 pills) as soon as possible. Take the rest each morning. For 6 days total 6-5-4-3-2-1.    Dispense:  21 tablet    Refill:  1   ketorolac (TORADOL) injection 60 mg    Cc: [name], MD,  [name], MD  Naomie Dean, MD  [facility] [address] [place], [place] [address]  Phone [phone] Fax [phone]  I spent over 30 minutes of face-to-face and non-face-to-face time with patient on the  1. Chronic migraine without aura without status migrainosus, not intractable   2. Status migrainosus      diagnosis.  This included previsit chart review, lab review, study review, order entry, electronic health record documentation, patient education on the different diagnostic and therapeutic options, counseling and coordination of care, risks and benefits of management, compliance, or risk factor reduction

## 2023-11-08 NOTE — Telephone Encounter (Signed)
-----   Message from Anson Fret sent at 11/08/2023 12:21 PM EST ----- Regarding: Please start he botox approval process G43.709  Please start te botox approval process. She will have to go to an experienced NP like Megan she has a weave.

## 2023-11-08 NOTE — Telephone Encounter (Signed)
Chronic Migraine CPT 64615  Botox [id number] Units:200  G43.709 Chronic Migraine without aura, not intractable, without status migrainous   

## 2023-11-10 NOTE — Telephone Encounter (Signed)
Submitted auth request via CMM Key: [id number].

## 2023-11-10 NOTE — Telephone Encounter (Signed)
[employer] denied Botox, stating that pt did not try required medications first. She has tried 2 of the required groups they're requesting. I sent appeal form to pt for her to sign and send back so we can appeal on her behalf.

## 2023-11-17 ENCOUNTER — Other Ambulatory Visit (HOSPITAL_COMMUNITY): Payer: Self-pay

## 2023-11-17 ENCOUNTER — Encounter (INDEPENDENT_AMBULATORY_CARE_PROVIDER_SITE_OTHER): Payer: Self-pay

## 2023-11-17 ENCOUNTER — Telehealth (INDEPENDENT_AMBULATORY_CARE_PROVIDER_SITE_OTHER): Payer: Self-pay

## 2023-11-17 ENCOUNTER — Ambulatory Visit (INDEPENDENT_AMBULATORY_CARE_PROVIDER_SITE_OTHER): Payer: Medicaid Other | Admitting: Adult Health

## 2023-11-17 ENCOUNTER — Encounter (INDEPENDENT_AMBULATORY_CARE_PROVIDER_SITE_OTHER): Payer: Self-pay | Admitting: Adult Health

## 2023-11-17 VITALS — BP 104/70 | HR 69 | Temp 98.4°F | Ht 63.0 in | Wt 188.0 lb

## 2023-11-17 DIAGNOSIS — Z6833 Body mass index (BMI) 33.0-33.9, adult: Secondary | ICD-10-CM

## 2023-11-17 DIAGNOSIS — E669 Obesity, unspecified: Secondary | ICD-10-CM

## 2023-11-17 DIAGNOSIS — E66813 Morbid (severe) obesity due to excess calories: Secondary | ICD-10-CM

## 2023-11-17 DIAGNOSIS — R7401 Elevation of levels of liver transaminase levels: Secondary | ICD-10-CM | POA: Diagnosis not present

## 2023-11-17 DIAGNOSIS — E88819 Insulin resistance, unspecified: Secondary | ICD-10-CM | POA: Diagnosis not present

## 2023-11-17 DIAGNOSIS — E538 Deficiency of other specified B group vitamins: Secondary | ICD-10-CM

## 2023-11-17 DIAGNOSIS — Z6841 Body Mass Index (BMI) 40.0 and over, adult: Secondary | ICD-10-CM

## 2023-11-17 DIAGNOSIS — E559 Vitamin D deficiency, unspecified: Secondary | ICD-10-CM

## 2023-11-17 MED ORDER — WEGOVY 0.5 MG/0.5ML ~~LOC~~ SOAJ: 0.5000 mg | SUBCUTANEOUS | 0 refills | Status: DC

## 2023-11-17 MED ORDER — VITAMIN D (ERGOCALCIFEROL) 1.25 MG (50000 UNIT) PO CAPS: 50000.0000 [IU] | ORAL_CAPSULE | ORAL | 0 refills | Status: DC

## 2023-11-17 MED ORDER — WEGOVY 0.5 MG/0.5ML ~~LOC~~ SOAJ
0.5000 mg | SUBCUTANEOUS | 0 refills | Status: DC
  Filled 2023-11-17: qty 2, 28d supply, fill #0

## 2023-11-17 NOTE — Progress Notes (Signed)
WEIGHT SUMMARY AND BIOMETRICS  Vitals Temp: 98.4 F (36.9 C) BP: 104/70 Pulse Rate: 69 SpO2: 96 %   Anthropometric Measurements Height: 5\' 3"  (1.6 m) Weight: 188 lb (85.3 kg) BMI (Calculated): 33.31 Weight at Last Visit: 192lb Weight Lost Since Last Visit: 4lb Weight Gained Since Last Visit: 0 Starting Weight: 237lb Total Weight Loss (lbs): 49 lb (22.2 kg) Peak Weight: 267lb   Body Composition  Body Fat %: 43 % Fat Mass (lbs): 81 lbs Muscle Mass (lbs): 101.8 lbs Total Body Water (lbs): 75.8 lbs Visceral Fat Rating : 9   Other Clinical Data Fasting: no Labs: no Today's Visit #: 2 Starting Date: 03/19/20    Chief Complaint:   OBESITY Lauren Garza is here to discuss her progress with her obesity treatment plan. She is on the the Category 2 Plan and states she is following her eating plan approximately 50 % of the time.  She states she is exercising Body Weight Exercises a few times per week.    Interim History:  2025 Health Goals 1) Increase plain water intake, goal at least 80 oz 2) Increase regular exercise  Reviewed Bioimpedance results with pt: Muscle Mass: - 1 lb Adipose Mass: -2.3 lbs  Unable to accomplish fasting labs today- will accomplish at next OV.  Subjective:   1. Vitamin B12 deficiency She endorses stable energy levels  Latest Reference Range & Units 06/15/23 10:17  Vitamin B12 232 - 1245 pg/mL >2000 (H)  (H): Data is abnormally high  She has not received B12 injections in "quite sometime".  2. Vitamin D deficiency She endorses stable energy levels  Latest Reference Range & Units 01/12/23 10:15 06/15/23 10:17  Vitamin D, 25-Hydroxy 30.0 - 100.0 ng/mL 59.2 49.6   She is on weekly Ergocalciferol- denies N/V/Muscle Weakness  3. Transaminitis She denies RUQ pain She denies ETOH use  4. Insulin resistance She is currently on weekly Wegovy 0.5mg  injections Denies mass in neck, dysphagia, dyspepsia, persistent hoarseness,  abdominal pain, or N/V/C  She reports stable appetite  Assessment/Plan:   1. Vitamin B12 deficiency Check Labs at next OV Refill  Vitamin D, Ergocalciferol, (DRISDOL) 1.25 MG (50000 UNIT) CAPS capsule Take 1 capsule (50,000 Units total) by mouth every 7 (seven) days. Dispense: 4 capsule, Refills: 0 ordered   2. Vitamin D deficiency Check Labs at next OV  3. Transaminitis (Primary) Check Labs at next OV  4. Insulin resistance Check Labs at next OV  5. Obesity, Current BMI 33.31 Refill Semaglutide-Weight Management (WEGOVY) 0.5 MG/0.5ML SOAJ Inject 0.5 mg into the skin once a week for 28 days. Dispense: 2 mL, Refills: 0 ordered   [name] is currently in the action stage of change. As such, her goal is to continue with weight loss efforts. She has agreed to the Category 2 Plan.   Exercise goals: For substantial health benefits, adults should do at least 150 minutes (2 hours and 30 minutes) a week of moderate-intensity, or 75 minutes (1 hour and 15 minutes) a week of vigorous-intensity aerobic physical activity, or an equivalent combination of moderate- and vigorous-intensity aerobic activity. Aerobic activity should be performed in episodes of at least 10 minutes, and preferably, it should be spread throughout the week.  Behavioral modification strategies: increasing lean protein intake, decreasing simple carbohydrates, increasing vegetables, increasing water intake, decreasing liquid calories, no skipping meals, meal planning and cooking strategies, keeping healthy foods in the home, ways to avoid boredom eating, travel eating strategies, holiday eating strategies , and planning  for success.  [name] has agreed to follow-up with our clinic in 4 weeks. She was informed of the importance of frequent follow-up visits to maximize her success with intensive lifestyle modifications for her multiple health conditions.   Check Fasting Labs at next OV   Objective:   Blood pressure  104/70, pulse 69, temperature 98.4 F (36.9 C), height 5\' 3"  (1.6 m), weight 188 lb (85.3 kg), last menstrual period 02/11/2018, SpO2 96%. Body mass index is 33.3 kg/m.  General: Cooperative, alert, well developed, in no acute distress. HEENT: Conjunctivae and lids unremarkable. Cardiovascular: Regular rhythm.  Lungs: Normal work of breathing. Neurologic: No focal deficits.   Lab Results  Component Value Date   CREATININE 0.73 06/05/2023   BUN 13 06/05/2023   NA 136 06/05/2023   K 3.8 06/05/2023   CL 100 06/05/2023   CO2 26 06/05/2023   Lab Results  Component Value Date   ALT 19 09/17/2022   AST 22 09/17/2022   ALKPHOS 107 09/17/2022   BILITOT 0.2 09/17/2022   Lab Results  Component Value Date   HGBA1C 5.6 06/15/2023   HGBA1C 5.7 (H) 01/12/2023   HGBA1C 5.7 (H) 09/17/2022   HGBA1C 5.4 03/18/2022   HGBA1C 5.4 01/14/2022   Lab Results  Component Value Date   INSULIN 30.9 (H) 06/15/2023   INSULIN 8.3 09/17/2022   INSULIN 10.3 03/18/2022   INSULIN 4.9 01/14/2022   INSULIN 7.0 07/31/2021   Lab Results  Component Value Date   TSH 1.470 01/05/2023   Lab Results  Component Value Date   CHOL 164 09/17/2022   HDL 67 09/17/2022   LDLCALC 86 09/17/2022   TRIG 51 09/17/2022   CHOLHDL 2.4 09/17/2022   Lab Results  Component Value Date   VD25OH 49.6 06/15/2023   VD25OH 59.2 01/12/2023   VD25OH 62.6 09/17/2022   Lab Results  Component Value Date   WBC 14.8 (H) 06/05/2023   HGB 13.1 06/05/2023   HCT 41.4 06/05/2023   MCV 84.8 06/05/2023   PLT 316 06/05/2023   No results found for: "IRON", "TIBC", "FERRITIN"  Attestation Statements:   Reviewed by clinician on day of visit: allergies, medications, problem list, medical history, surgical history, family history, social history, and previous encounter notes.  I have reviewed the above documentation for accuracy and completeness, and I agree with the above. -  Tramell Piechota d. Jesica Goheen, NP-C

## 2023-11-17 NOTE — Telephone Encounter (Signed)
Prior auth started Key: [id number])

## 2023-11-17 NOTE — Telephone Encounter (Signed)
CS Product Previously Approved We received a prior authorization request for the member and product listed above. The [facility] Prior Authorization Team is not able to review this request because the requested product has been previously approved under [id number]. Based on the information reviewed, the requested prescription is currently authorized for coverage by the plan until 02/13/2024.

## 2023-11-22 ENCOUNTER — Other Ambulatory Visit: Payer: Self-pay

## 2023-11-22 ENCOUNTER — Other Ambulatory Visit (HOSPITAL_COMMUNITY): Payer: Self-pay

## 2023-12-07 ENCOUNTER — Encounter: Payer: Self-pay | Admitting: Neurology

## 2023-12-07 NOTE — Telephone Encounter (Signed)
 Faxed appeal form with pt's signature along with OV notes detailing tried/failed meds to [facility] @ [phone].

## 2023-12-07 NOTE — Telephone Encounter (Signed)
 Pt asking for PA to be renewed.

## 2023-12-08 ENCOUNTER — Other Ambulatory Visit (HOSPITAL_COMMUNITY): Payer: Self-pay

## 2023-12-08 ENCOUNTER — Telehealth: Payer: Self-pay | Admitting: Pharmacy Technician

## 2023-12-08 NOTE — Telephone Encounter (Signed)
 PA request has been Submitted. New Encounter created for follow up. For additional info see Pharmacy Prior Auth telephone encounter from 12-08-2023.

## 2023-12-08 NOTE — Telephone Encounter (Signed)
 This is already pending with insurance.

## 2023-12-08 NOTE — Telephone Encounter (Signed)
 Pharmacy Patient Advocate Encounter  Received notification from [facility] MEDICAID that Prior Authorization for AJOVY  (fremanezumab -vfrm) injection 225MG /1.5ML auto-injectors has been APPROVED from 12/08/2023 to 12/07/2024. Ran test claim, Copay is $4.00. This test claim was processed through [facility]- copay amounts may vary at other pharmacies due to pharmacy/plan contracts, or as the patient moves through the different stages of their insurance plan.   PA #/Case ID/Reference #: PA Case ID #: [id number]

## 2023-12-08 NOTE — Telephone Encounter (Signed)
 Received call from [facility] that a PA needs to be done for Ajovy.

## 2023-12-08 NOTE — Telephone Encounter (Signed)
 Pharmacy Patient Advocate Encounter   Received notification from Pt Calls Messages that prior authorization for AJOVY  (fremanezumab -vfrm) injection 225MG /1.5ML auto-injectors is required/requested.   Insurance verification completed.   The patient is insured through [facility] MEDICAID .   Per test claim: PA required; PA submitted to above mentioned insurance via CoverMyMeds Key/confirmation #/EOC [id number] Status is pending

## 2023-12-22 ENCOUNTER — Other Ambulatory Visit (HOSPITAL_COMMUNITY): Payer: Self-pay

## 2023-12-22 ENCOUNTER — Ambulatory Visit (INDEPENDENT_AMBULATORY_CARE_PROVIDER_SITE_OTHER): Payer: Medicaid Other | Admitting: Adult Health

## 2023-12-22 ENCOUNTER — Encounter (INDEPENDENT_AMBULATORY_CARE_PROVIDER_SITE_OTHER): Payer: Self-pay | Admitting: Adult Health

## 2023-12-22 VITALS — BP 109/72 | HR 68 | Temp 98.3°F | Ht 63.0 in | Wt 189.0 lb

## 2023-12-22 DIAGNOSIS — E538 Deficiency of other specified B group vitamins: Secondary | ICD-10-CM

## 2023-12-22 DIAGNOSIS — R7401 Elevation of levels of liver transaminase levels: Secondary | ICD-10-CM

## 2023-12-22 DIAGNOSIS — E559 Vitamin D deficiency, unspecified: Secondary | ICD-10-CM

## 2023-12-22 DIAGNOSIS — J3089 Other allergic rhinitis: Secondary | ICD-10-CM | POA: Diagnosis not present

## 2023-12-22 DIAGNOSIS — E88819 Insulin resistance, unspecified: Secondary | ICD-10-CM

## 2023-12-22 DIAGNOSIS — E66813 Obesity, class 3: Secondary | ICD-10-CM

## 2023-12-22 DIAGNOSIS — E669 Obesity, unspecified: Secondary | ICD-10-CM

## 2023-12-22 DIAGNOSIS — Z6833 Body mass index (BMI) 33.0-33.9, adult: Secondary | ICD-10-CM

## 2023-12-22 MED ORDER — WEGOVY 0.5 MG/0.5ML ~~LOC~~ SOAJ: 0.5000 mg | SUBCUTANEOUS | 0 refills | Status: DC

## 2023-12-22 MED ORDER — VITAMIN D (ERGOCALCIFEROL) 1.25 MG (50000 UNIT) PO CAPS: 50000.0000 [IU] | ORAL_CAPSULE | ORAL | 0 refills | Status: DC

## 2023-12-22 MED ORDER — WEGOVY 0.5 MG/0.5ML ~~LOC~~ SOAJ
0.5000 mg | SUBCUTANEOUS | 0 refills | Status: DC
  Filled 2023-12-22: qty 2, 28d supply, fill #0

## 2023-12-22 NOTE — Progress Notes (Addendum)
WEIGHT SUMMARY AND BIOMETRICS  Vitals Temp: 98.3 F (36.8 C) BP: 109/72 Pulse Rate: 68 SpO2: 97 %   Anthropometric Measurements Height: 5\' 3"  (1.6 m) Weight: 189 lb (85.7 kg) BMI (Calculated): 33.49 Weight at Last Visit: 188lb Weight Lost Since Last Visit: 0 Weight Gained Since Last Visit: 1lb Starting Weight: 237lb Total Weight Loss (lbs): 48 lb (21.8 kg) Peak Weight: 267lb   Body Composition  Body Fat %: 43.7 % Fat Mass (lbs): 82.6 lbs Muscle Mass (lbs): 101 lbs Total Body Water (lbs): 77.8 lbs Visceral Fat Rating : 10   Other Clinical Data Fasting: no Labs: yes Today's Visit #: 41 Starting Date: 03/19/20    Chief Complaint:   OBESITY Arieanna is here to discuss her progress with her obesity treatment plan. She is on the the Category 2 Plan and states she is following her eating plan approximately 0 % of the time.  She states she is exercising "Squat Challenges" 3 sets daily, NEAT Activities.   Interim History:  Ms. Drown provided the following food recall that is typical of a day: Breakfast: One of the following: Skip Peanut Butter Crackers [place]: Grilled chicken, egg, cheese biscuit Lunch: Either skips or "orders in with the office" Mythos Arbys Dinner: "What ever grandma makes"- chicken or pork chops Smoothed potatoes   Subjective:   1. Transaminitis She denies RUQ She denies regular ETOH use  Latest Reference Range & Units 09/17/22 10:27  Alkaline Phosphatase 44 - 121 IU/L 107  Albumin 3.9 - 4.9 g/dL 4.1  Albumin/Globulin Ratio 1.2 - 2.2  1.4  AST 0 - 40 IU/L 22  ALT 0 - 32 IU/L 19    2. Environmental and seasonal allergies She is currently taking: Daily Singulair 10mg , Xyzal 5mg , and Symbicort 2 puffs (not taking BID) She reports limited plain water intake  3. Vitamin B12 deficiency  Latest Reference Range & Units 06/15/23 10:17  Vitamin B12 232 - 1245 pg/mL >2000 (H)  (H): Data is abnormally high  She is  receiving B12 injections Q30 days PCP/Dr. Pecola Leisure is providing therapy She has been off for 2-3 months  4. Vitamin D deficiency  Latest Reference Range & Units 06/15/23 10:17  Vitamin D, 25-Hydroxy 30.0 - 100.0 ng/mL 49.6   She is on weekly Ergocalciferol- denies N/V/Muscle Weakess  5. Insulin resistance  Latest Reference Range & Units 06/15/23 10:17  Hemoglobin A1C 4.8 - 5.6 % 5.6  Est. average glucose Bld gHb Est-mCnc mg/dL 824  INSULIN 2.6 - 23.5 uIU/mL 30.9 (H)  (H): Data is abnormally high  She is on weekly Wegovy 0.5mg  Denies mass in neck, dysphagia, persistent hoarseness, abdominal pain, or N/V/C. She will experience mild acid reflux and dyspepsia after larger meals.  Assessment/Plan:   1. Transaminitis Check Labs - Comprehensive metabolic panel  2. Environmental and seasonal allergies Increase water and limit exposure to known allergens  3. Vitamin B12 deficiency (Primary) Check Labs - Vitamin B12  4. Vitamin D deficiency Refill and Check Labs - VITAMIN D 25 Hydroxy (Vit-D Deficiency, Fractures) - Vitamin D, Ergocalciferol, (DRISDOL) 1.25 MG (50000 UNIT) CAPS capsule; Take 1 capsule (50,000 Units total) by mouth every 7 (seven) days.  Dispense: 4 capsule; Refill: 0  5. Insulin resistance Check Labs - Hemoglobin A1c - Insulin, random  6. Obesity, Current BMI 33.49 Refill Semaglutide-Weight Management (WEGOVY) 0.5 MG/0.5ML SOAJ Inject 0.5 mg into the skin once a week for 28 days. Dispense: 2 mL, Refills: 0 ordered  Kieren is currently in the action stage of change. As such, her goal is to continue with weight loss efforts. She has agreed to the Category 2 Plan.   Exercise goals: For substantial health benefits, adults should do at least 150 minutes (2 hours and 30 minutes) a week of moderate-intensity, or 75 minutes (1 hour and 15 minutes) a week of vigorous-intensity aerobic physical activity, or an equivalent combination of moderate- and  vigorous-intensity aerobic activity. Aerobic activity should be performed in episodes of at least 10 minutes, and preferably, it should be spread throughout the week.  Behavioral modification strategies: increasing lean protein intake, decreasing simple carbohydrates, increasing vegetables, increasing water intake, meal planning and cooking strategies, keeping healthy foods in the home, ways to avoid boredom eating, ways to avoid night time snacking, better snacking choices, emotional eating strategies, and planning for success.  Amando has agreed to follow-up with our clinic in 4 weeks. She was informed of the importance of frequent follow-up visits to maximize her success with intensive lifestyle modifications for her multiple health conditions.   Syvilla was informed we would discuss her lab results at her next visit unless there is a critical issue that needs to be addressed sooner. Railee agreed to keep her next visit at the agreed upon time to discuss these results.  Objective:   Blood pressure 109/72, pulse 68, temperature 98.3 F (36.8 C), height 5\' 3"  (1.6 m), weight 189 lb (85.7 kg), last menstrual period 02/11/2018, SpO2 97%. Body mass index is 33.48 kg/m.  General: Cooperative, alert, well developed, in no acute distress. HEENT: Conjunctivae and lids unremarkable. Cardiovascular: Regular rhythm.  Lungs: Normal work of breathing. Neurologic: No focal deficits.   Lab Results  Component Value Date   CREATININE 0.73 06/05/2023   BUN 13 06/05/2023   NA 136 06/05/2023   K 3.8 06/05/2023   CL 100 06/05/2023   CO2 26 06/05/2023   Lab Results  Component Value Date   ALT 19 09/17/2022   AST 22 09/17/2022   ALKPHOS 107 09/17/2022   BILITOT 0.2 09/17/2022   Lab Results  Component Value Date   HGBA1C 5.6 06/15/2023   HGBA1C 5.7 (H) 01/12/2023   HGBA1C 5.7 (H) 09/17/2022   HGBA1C 5.4 03/18/2022   HGBA1C 5.4 01/14/2022   Lab Results  Component Value Date   INSULIN  30.9 (H) 06/15/2023   INSULIN 8.3 09/17/2022   INSULIN 10.3 03/18/2022   INSULIN 4.9 01/14/2022   INSULIN 7.0 07/31/2021   Lab Results  Component Value Date   TSH 1.470 01/05/2023   Lab Results  Component Value Date   CHOL 164 09/17/2022   HDL 67 09/17/2022   LDLCALC 86 09/17/2022   TRIG 51 09/17/2022   CHOLHDL 2.4 09/17/2022   Lab Results  Component Value Date   VD25OH 49.6 06/15/2023   VD25OH 59.2 01/12/2023   VD25OH 62.6 09/17/2022   Lab Results  Component Value Date   WBC 14.8 (H) 06/05/2023   HGB 13.1 06/05/2023   HCT 41.4 06/05/2023   MCV 84.8 06/05/2023   PLT 316 06/05/2023   No results found for: "IRON", "TIBC", "FERRITIN"   Attestation Statements:   Reviewed by clinician on day of visit: allergies, medications, problem list, medical history, surgical history, family history, social history, and previous encounter notes.  I have reviewed the above documentation for accuracy and completeness, and I agree with the above. -  Becca Bayne d. Yena Tisby, NP-C

## 2023-12-22 NOTE — Addendum Note (Signed)
Addended by: William Hamburger D on: 12/22/2023 10:11 AM   Modules accepted: Orders

## 2023-12-23 LAB — COMPREHENSIVE METABOLIC PANEL
ALT: 12 [IU]/L (ref 0–32)
AST: 12 [IU]/L (ref 0–40)
Albumin: 4 g/dL (ref 3.9–4.9)
Alkaline Phosphatase: 103 [IU]/L (ref 44–121)
BUN/Creatinine Ratio: 11 (ref 9–23)
BUN: 11 mg/dL (ref 6–24)
Bilirubin Total: 0.3 mg/dL (ref 0.0–1.2)
CO2: 20 mmol/L (ref 20–29)
Calcium: 9.2 mg/dL (ref 8.7–10.2)
Chloride: 108 mmol/L — ABNORMAL HIGH (ref 96–106)
Creatinine, Ser: 0.99 mg/dL (ref 0.57–1.00)
Globulin, Total: 2.7 g/dL (ref 1.5–4.5)
Glucose: 83 mg/dL (ref 70–99)
Potassium: 4.5 mmol/L (ref 3.5–5.2)
Sodium: 141 mmol/L (ref 134–144)
Total Protein: 6.7 g/dL (ref 6.0–8.5)
eGFR: 74 mL/min/{1.73_m2} (ref >59)

## 2023-12-23 LAB — INSULIN, RANDOM: INSULIN: 11.7 u[IU]/mL (ref 2.6–24.9)

## 2023-12-23 LAB — HEMOGLOBIN A1C
Est. average glucose Bld gHb Est-mCnc: 108 mg/dL
Hgb A1c MFr Bld: 5.4 % (ref 4.8–5.6)

## 2023-12-23 LAB — VITAMIN B12: Vitamin B-12: 843 pg/mL (ref 232–1245)

## 2023-12-23 LAB — VITAMIN D 25 HYDROXY (VIT D DEFICIENCY, FRACTURES): Vit D, 25-Hydroxy: 59.1 ng/mL (ref 30.0–100.0)

## 2023-12-30 NOTE — Telephone Encounter (Signed)
I called [employer] to check status of appeal since it's been a few weeks without hearing back, spoke with Hurshel Party. He was able to find the appeal under the appeal ID#: [id number]. He states the appeal has been approved and will fax me over the approval letter.  Auth#: [id number] (12/17/23-06/15/24)

## 2024-01-03 MED ORDER — ONABOTULINUMTOXINA 200 UNITS IJ SOLR: INTRAMUSCULAR | 3 refills | Status: DC

## 2024-01-03 NOTE — Telephone Encounter (Signed)
Called pt and scheduled with Megan for 2/27, informed pt that [employer] would be calling for consent.

## 2024-01-03 NOTE — Telephone Encounter (Signed)
 Botox 200 unit Rx sent to [facility].

## 2024-01-03 NOTE — Telephone Encounter (Signed)
Please send rx to [facility], thank you!

## 2024-01-03 NOTE — Addendum Note (Signed)
Addended by: Bertram Savin on: 01/03/2024 08:45 AM   Modules accepted: Orders

## 2024-01-13 NOTE — Telephone Encounter (Signed)
Delivery pending pt consent.

## 2024-01-17 ENCOUNTER — Telehealth: Payer: Self-pay | Admitting: Adult Health

## 2024-01-17 NOTE — Telephone Encounter (Signed)
 Noted

## 2024-01-17 NOTE — Telephone Encounter (Signed)
Victorino Dike from [facility] called stating that 1 Vile 200 units 30 day supply will be delivered on the 19th. Signature required.

## 2024-01-18 ENCOUNTER — Encounter (INDEPENDENT_AMBULATORY_CARE_PROVIDER_SITE_OTHER): Payer: Self-pay | Admitting: Adult Health

## 2024-01-18 ENCOUNTER — Ambulatory Visit (INDEPENDENT_AMBULATORY_CARE_PROVIDER_SITE_OTHER): Payer: Medicaid Other | Admitting: Adult Health

## 2024-01-18 DIAGNOSIS — Z6832 Body mass index (BMI) 32.0-32.9, adult: Secondary | ICD-10-CM

## 2024-01-18 DIAGNOSIS — R7401 Elevation of levels of liver transaminase levels: Secondary | ICD-10-CM | POA: Diagnosis not present

## 2024-01-18 DIAGNOSIS — E538 Deficiency of other specified B group vitamins: Secondary | ICD-10-CM

## 2024-01-18 DIAGNOSIS — E66813 Obesity, class 3: Secondary | ICD-10-CM

## 2024-01-18 DIAGNOSIS — E88819 Insulin resistance, unspecified: Secondary | ICD-10-CM

## 2024-01-18 DIAGNOSIS — E559 Vitamin D deficiency, unspecified: Secondary | ICD-10-CM | POA: Diagnosis not present

## 2024-01-18 DIAGNOSIS — E669 Obesity, unspecified: Secondary | ICD-10-CM

## 2024-01-18 MED ORDER — VITAMIN D (ERGOCALCIFEROL) 1.25 MG (50000 UNIT) PO CAPS: 50000.0000 [IU] | ORAL_CAPSULE | ORAL | 0 refills | Status: DC

## 2024-01-18 MED ORDER — WEGOVY 0.5 MG/0.5ML ~~LOC~~ SOAJ: 0.5000 mg | SUBCUTANEOUS | 0 refills | Status: AC

## 2024-01-18 NOTE — Progress Notes (Signed)
WEIGHT SUMMARY AND BIOMETRICS  Vitals Temp: 98.9 F (37.2 C) BP: 104/71 Pulse Rate: (!) 51 SpO2: 91 %   Anthropometric Measurements Height: 5\' 3"  (1.6 m) Weight: 184 lb (83.5 kg) BMI (Calculated): 32.6 Weight at Last Visit: 189 lb Weight Lost Since Last Visit: 5 lb Weight Gained Since Last Visit: 0 Starting Weight: 237 lb Total Weight Loss (lbs): 53 lb (24 kg) Peak Weight: 267 lb   Body Composition  Body Fat %: 41.7 % Fat Mass (lbs): 76.8 lbs Muscle Mass (lbs): 101.8 lbs Total Body Water (lbs): 74.6 lbs Visceral Fat Rating : 9   Other Clinical Data Fasting: no Labs: no Today's Visit #: 70 Starting Date: 03/19/20    Chief Complaint:   OBESITY Lauren Garza is here to discuss her progress with her obesity treatment plan.  She is on the the Category 2 Plan and states she is following her eating plan approximately 50 % of the time.  She states she is exercising Daily Squats  ?? minutes 7 times per week.   Interim History:  Started on Wegovy 0.25mg  on/about 09/01/2023 Increase Wegovy form 0.25mg  to 0.5mg  once weekly injection on/about 10/14/2023  Current weight 184 with corresponding BMI 32.6 To achieve BMI <30, need to loss down to 165 lbs (Corresponding BMI 29.2).  Subjective:   1. Vitamin D deficiency Discussed Labs  Latest Reference Range & Units 12/22/23 10:52  Vitamin D, 25-Hydroxy 30.0 - 100.0 ng/mL 59.1   2. Insulin resistance Discussed Labs  Latest Reference Range & Units 12/22/23 10:52  Glucose 70 - 99 mg/dL 83  Hemoglobin Z6X 4.8 - 5.6 % 5.4  Est. average glucose Bld gHb Est-mCnc mg/dL 096  INSULIN 2.6 - 04.5 uIU/mL 11.7   Vit D Level at goal She is weekly Ergocalciferol- denies N/V/Muscle Weakness  3. Vitamin B12 deficiency Discussed Labs  Latest Reference Range & Units 12/22/23 10:52  Vitamin B12 232 - 1,245 pg/mL 843   She has decreased her B12 injections from monthly to bi-monthly She endorses stable energy levels  4.  Transaminitis Discussed Labs  Latest Reference Range & Units 12/22/23 10:52  Alkaline Phosphatase 44 - 121 IU/L 103  Albumin 3.9 - 4.9 g/dL 4.0  AST 0 - 40 IU/L 12  ALT 0 - 32 IU/L 12   She denies RUQ pain She infrequently drinks ETOH She denies frequent Acetaminophen use  Assessment/Plan:   1. Vitamin D deficiency Refill and DECREASE Vitamin D, Ergocalciferol, (DRISDOL) 1.25 MG (50000 UNIT) CAPS capsule Take 1 capsule (50,000 Units total) by mouth every 14 (fourteen) days. Dispense: 4 capsule, Refills: 0 ordered   2. Insulin resistance Continue to increase protein intake and limit sugar/simple CHO  3. Vitamin B12 deficiency Continue with B12 injections every OTHER month  4. Transaminitis Continue with weight loss efforts Monitor Labs  5. Obesity, Current BMI 32.6 Refill  Semaglutide-Weight Management (WEGOVY) 0.5 MG/0.5ML SOAJ Inject 0.5 mg into the skin once a week for 28 days. Dispense: 2 mL, Refills: 0 ordered   [name] is currently in the action stage of change. As such, her goal is to continue with weight loss efforts. She has agreed to the Category 2 Plan.   Exercise goals: For substantial health benefits, adults should do at least 150 minutes (2 hours and 30 minutes) a week of moderate-intensity, or 75 minutes (1 hour and 15 minutes) a week of vigorous-intensity aerobic physical activity, or an equivalent combination of moderate- and vigorous-intensity aerobic activity. Aerobic activity should be performed  in episodes of at least 10 minutes, and preferably, it should be spread throughout the week.  Behavioral modification strategies: increasing lean protein intake, decreasing simple carbohydrates, increasing vegetables, increasing water intake, no skipping meals, meal planning and cooking strategies, keeping healthy foods in the home, ways to avoid boredom eating, and planning for success.  [name] has agreed to follow-up with our clinic in 4 weeks. She was informed  of the importance of frequent follow-up visits to maximize her success with intensive lifestyle modifications for her multiple health conditions.   Objective:   Blood pressure 104/71, pulse (!) 51, temperature 98.9 F (37.2 C), height 5\' 3"  (1.6 m), weight 184 lb (83.5 kg), last menstrual period 02/11/2018, SpO2 91%. Body mass index is 32.59 kg/m.  General: Cooperative, alert, well developed, in no acute distress. HEENT: Conjunctivae and lids unremarkable. Cardiovascular: Regular rhythm.  Lungs: Normal work of breathing. Neurologic: No focal deficits.   Lab Results  Component Value Date   CREATININE 0.99 12/22/2023   BUN 11 12/22/2023   NA 141 12/22/2023   K 4.5 12/22/2023   CL 108 (H) 12/22/2023   CO2 20 12/22/2023   Lab Results  Component Value Date   ALT 12 12/22/2023   AST 12 12/22/2023   ALKPHOS 103 12/22/2023   BILITOT 0.3 12/22/2023   Lab Results  Component Value Date   HGBA1C 5.4 12/22/2023   HGBA1C 5.6 06/15/2023   HGBA1C 5.7 (H) 01/12/2023   HGBA1C 5.7 (H) 09/17/2022   HGBA1C 5.4 03/18/2022   Lab Results  Component Value Date   INSULIN 11.7 12/22/2023   INSULIN 30.9 (H) 06/15/2023   INSULIN 8.3 09/17/2022   INSULIN 10.3 03/18/2022   INSULIN 4.9 01/14/2022   Lab Results  Component Value Date   TSH 1.470 01/05/2023   Lab Results  Component Value Date   CHOL 164 09/17/2022   HDL 67 09/17/2022   LDLCALC 86 09/17/2022   TRIG 51 09/17/2022   CHOLHDL 2.4 09/17/2022   Lab Results  Component Value Date   VD25OH 59.1 12/22/2023   VD25OH 49.6 06/15/2023   VD25OH 59.2 01/12/2023   Lab Results  Component Value Date   WBC 14.8 (H) 06/05/2023   HGB 13.1 06/05/2023   HCT 41.4 06/05/2023   MCV 84.8 06/05/2023   PLT 316 06/05/2023   No results found for: "IRON", "TIBC", "FERRITIN"  Attestation Statements:   Reviewed by clinician on day of visit: allergies, medications, problem list, medical history, surgical history, family history, social history,  and previous encounter notes.  I have reviewed the above documentation for accuracy and completeness, and I agree with the above. -  Mauriana Dann d. Eder Macek, NP-C

## 2024-01-19 ENCOUNTER — Ambulatory Visit (INDEPENDENT_AMBULATORY_CARE_PROVIDER_SITE_OTHER): Payer: Medicaid Other | Admitting: Adult Health

## 2024-01-27 ENCOUNTER — Ambulatory Visit: Payer: Medicaid Other | Admitting: Adult Health

## 2024-01-27 DIAGNOSIS — G43709 Chronic migraine without aura, not intractable, without status migrainosus: Secondary | ICD-10-CM

## 2024-01-27 MED ORDER — ONABOTULINUMTOXINA 200 UNITS IJ SOLR
155.0000 [IU] | Freq: Once | INTRAMUSCULAR | Status: AC
  Administered 2024-01-27: 155 [IU] via INTRAMUSCULAR

## 2024-01-27 MED ORDER — ONABOTULINUMTOXINA 200 UNITS IJ SOLR
155.0000 [IU] | Freq: Once | INTRAMUSCULAR | Status: DC
Start: 2024-01-27 — End: 2024-01-27

## 2024-01-27 NOTE — Progress Notes (Signed)
 Botox- 200 units x 1 vial Lot: [id number] Expiration: 03/2026 NDC: 0981-1914-78  Bacteriostatic 0.9% Sodium Chloride- 4 mL  Lot: [id number] Expiration: 09/30/2024 NDC: 3086-5784-69  Dx: G29.528 S/P  Witnessed by Truitt Leep RN

## 2024-01-27 NOTE — Progress Notes (Signed)
 01/27/24: This is patient's first Botox.  I reviewed potential side effects of Botox with the patient and reviewed the procedure itself. Consent signed. Patient is concerned about possible asymmetry on the face.  She would like to avoid injecting the face.  She does have a hair piece on today.  She was able to remove it so we could proceed with Botox.  However it was hard to visualize where to inject in the occipital region and cervical paraspinal muscles.  Dr. Daisy Blossom did come into the visit and did the injections in those regions    BOTOX PROCEDURE NOTE FOR MIGRAINE HEADACHE    Contraindications and precautions discussed with patient(above). Aseptic procedure was observed and patient tolerated procedure. Procedure performed by Butch Penny, NP  The condition has existed for more than 6 months, and pt does not have a diagnosis of ALS, Myasthenia Gravis or Lambert-Eaton Syndrome.  Risks and benefits of injections discussed and pt agrees to proceed with the procedure.  Written consent obtained  These injections are medically necessary. These injections do not cause sedations or hallucinations which the oral therapies may cause.  Indication/Diagnosis: chronic migraine BOTOX(J0585) injection was performed according to protocol by [employer]. 200 units of BOTOX was dissolved into 4 cc NS.   NDC: [address]-40  Type of toxin: Botox  Botox- 200 units x 1 vial Lot: [id number] Expiration: 03/2026 NDC: 7829-5621-30   Bacteriostatic 0.9% Sodium Chloride- 4 mL  Lot: [id number] Expiration: 09/30/2024 NDC: 6962-9528-41   Dx: L24.401     Description of procedure:  The patient was placed in a sitting position. The standard protocol was used for Botox as follows, with 5 units of Botox injected at each site:   -Procerus muscle, refused  -Corrugator muscle, refused  -Frontalis muscle, refused  -Temporalis muscle injection, 4 sites, bilaterally. The first injection was 3 cm above the tragus  of the ear, second injection site was 1.5 cm to 3 cm up from the first injection site in line with the tragus of the ear. The third injection site was 1.5-3 cm forward between the first 2 injection sites. The fourth injection site was 1.5 cm posterior to the second injection site.  -Occipitalis muscle injection, 3 sites, bilaterally. The first injection was done one half way between the occipital protuberance and the tip of the mastoid process behind the ear. The second injection site was done lateral and superior to the first, 1 fingerbreadth from the first injection. The third injection site was 1 fingerbreadth superiorly and medially from the first injection site. Dr. Lucia Gaskins did this injection.   -Cervical paraspinal muscle injection, 2 sites, bilateral knee first injection site was 1 cm from the midline of the cervical spine, 3 cm inferior to the lower border of the occipital protuberance. The second injection site was 1.5 cm superiorly and laterally to the first injection site. Dr. Lucia Gaskins did this injection   -Trapezius muscle injection was performed at 3 sites, bilaterally. The first injection site was in the upper trapezius muscle halfway between the inflection point of the neck, and the acromion. The second injection site was one half way between the acromion and the first injection site. The third injection was done between the first injection site and the inflection point of the neck.   Will return for repeat injection in 3 months.   A 200 unit sof Botox was used, 125 units were injected, the rest of the Botox was wasted. The patient tolerated the procedure well, there were  no complications of the above procedure.  Butch Penny, MSN, NP-C 01/27/2024, 5:06 AM [facility] [address], [address] [place], [place] [address] [phone]

## 2024-02-17 ENCOUNTER — Encounter (INDEPENDENT_AMBULATORY_CARE_PROVIDER_SITE_OTHER): Payer: Self-pay | Admitting: Adult Health

## 2024-02-17 ENCOUNTER — Ambulatory Visit (INDEPENDENT_AMBULATORY_CARE_PROVIDER_SITE_OTHER): Payer: Medicaid Other | Admitting: Adult Health

## 2024-02-17 VITALS — BP 100/66 | HR 77 | Temp 98.5°F | Ht 63.0 in | Wt 179.0 lb

## 2024-02-17 DIAGNOSIS — Z6841 Body Mass Index (BMI) 40.0 and over, adult: Secondary | ICD-10-CM

## 2024-02-17 DIAGNOSIS — E538 Deficiency of other specified B group vitamins: Secondary | ICD-10-CM | POA: Diagnosis not present

## 2024-02-17 DIAGNOSIS — E559 Vitamin D deficiency, unspecified: Secondary | ICD-10-CM | POA: Diagnosis not present

## 2024-02-17 DIAGNOSIS — R7401 Elevation of levels of liver transaminase levels: Secondary | ICD-10-CM | POA: Diagnosis not present

## 2024-02-17 DIAGNOSIS — E66813 Obesity, class 3: Secondary | ICD-10-CM

## 2024-02-17 DIAGNOSIS — E669 Obesity, unspecified: Secondary | ICD-10-CM

## 2024-02-17 DIAGNOSIS — Z6831 Body mass index (BMI) 31.0-31.9, adult: Secondary | ICD-10-CM

## 2024-02-17 DIAGNOSIS — R7303 Prediabetes: Secondary | ICD-10-CM | POA: Diagnosis not present

## 2024-02-17 MED ORDER — VITAMIN D (ERGOCALCIFEROL) 1.25 MG (50000 UNIT) PO CAPS: 50000.0000 [IU] | ORAL_CAPSULE | ORAL | 0 refills | Status: DC

## 2024-02-17 MED ORDER — WEGOVY 0.5 MG/0.5ML ~~LOC~~ SOAJ: 0.5000 mg | SUBCUTANEOUS | 0 refills | Status: DC

## 2024-02-17 NOTE — Progress Notes (Signed)
 WEIGHT SUMMARY AND BIOMETRICS  Vitals Temp: 98.5 F (36.9 C) BP: 100/66 Pulse Rate: 77 SpO2: 90 % (cold hands and long fingernails)   Anthropometric Measurements Height: 5\' 3"  (1.6 m) Weight: 179 lb (81.2 kg) BMI (Calculated): 31.72 Weight at Last Visit: 184 lb Weight Lost Since Last Visit: 5 lb Weight Gained Since Last Visit: 0 lb Starting Weight: 237 lb Total Weight Loss (lbs): 58 lb (26.3 kg) Peak Weight: 267 lb   Body Composition  Body Fat %: 42.1 % Fat Mass (lbs): 75.4 lbs Muscle Mass (lbs): 98.4 lbs Total Body Water (lbs): 76.6 lbs Visceral Fat Rating : 9   Other Clinical Data Fasting: no Labs: no Today's Visit #: 53 Starting Date: 03/19/20    Chief Complaint:   OBESITY Karena is here to discuss her progress with her obesity treatment plan.  She is on the the Category 2 Plan and states she is following her eating plan approximately 50 % of the time.  She states she is exercising Daily Squats-30 at a time   Interim History:  She started [facility] 0.25mg  on/about 09/01/2023  10/13/2024 Wegovy 0.25mg  to 0.5mg - been maintained on lowest mx dose since then  She was previously in size 16/18 She is currently size 12/14  Exercise-daily squat challenge Hydration-she estimates to drink at least 50 oz/day  Reviewed Bioimpedance results with pt: Muscle Mass:-3.4 lbs Adipose Mass:- 1.4 lbs  Subjective:   1. Transaminitis She denies RUQ pain She rarely consumes ETOH- monthly  2. Prediabetes Lab Results  Component Value Date   HGBA1C 5.4 12/22/2023   HGBA1C 5.6 06/15/2023   HGBA1C 5.7 (H) 01/12/2023    She has family hx of diabetes- mother, maternal aunts, and maternal grandmother She started [facility] 0.25mg  on/about 09/01/2023  10/13/2024 Wegovy 0.25mg  to 0.5mg - been maintained on lowest mx dose since then Denies mass in neck, dysphagia, dyspepsia, persistent hoarseness, abdominal pain, or N/V/C  Of Note- She is unable to tolerate Metformin,  re: immediate yeast infection   3. Vitamin B12 deficiency She is taking B12 injection every OTHER month She reports acceptable energy levels  4. Vitamin D deficiency She is on bi-weekly Ergocalciferol- denies N/V/Muscle Weakness She endorses stable energy levels  Assessment/Plan:   1. Transaminitis (Primary) Continue with weight loss efforts  2. Prediabetes Limit simple CHO/sugar Continue to increase protein Continue regular activity  3. Vitamin B12 deficiency Continue B12 injections per PCP  4. Vitamin D deficiency Refill - Vitamin D, Ergocalciferol, (DRISDOL) 1.25 MG (50000 UNIT) CAPS capsule; Take 1 capsule (50,000 Units total) by mouth every 14 (fourteen) days.  Dispense: 4 capsule; Refill: 0  5. Obesity, Current BMI 31.7 Refill Semaglutide-Weight Management (WEGOVY) 0.5 MG/0.5ML SOAJ Inject 0.5 mg into the skin once a week. Dispense: 2 mL, Refills: 0 ordered   Raquel is currently in the action stage of change. As such, her goal is to continue with weight loss efforts. She has agreed to the Category 2 Plan.   Exercise goals: For substantial health benefits, adults should do at least 150 minutes (2 hours and 30 minutes) a week of moderate-intensity, or 75 minutes (1 hour and 15 minutes) a week of vigorous-intensity aerobic physical activity, or an equivalent combination of moderate- and vigorous-intensity aerobic activity. Aerobic activity should be performed in episodes of at least 10 minutes, and preferably, it should be spread throughout the week.  Behavioral modification strategies: increasing lean protein intake, decreasing simple carbohydrates, increasing vegetables, increasing water intake, decreasing eating out, no skipping  meals, meal planning and cooking strategies, keeping healthy foods in the home, and planning for success.  Von has agreed to follow-up with our clinic in 4 weeks. She was informed of the importance of frequent follow-up visits to maximize  her success with intensive lifestyle modifications for her multiple health conditions.   Objective:   Blood pressure 100/66, pulse 77, temperature 98.5 F (36.9 C), height 5\' 3"  (1.6 m), weight 179 lb (81.2 kg), last menstrual period 02/11/2018, SpO2 90%. Body mass index is 31.71 kg/m.  General: Cooperative, alert, well developed, in no acute distress. HEENT: Conjunctivae and lids unremarkable. Cardiovascular: Regular rhythm.  Lungs: Normal work of breathing. Neurologic: No focal deficits.   Lab Results  Component Value Date   CREATININE 0.99 12/22/2023   BUN 11 12/22/2023   NA 141 12/22/2023   K 4.5 12/22/2023   CL 108 (H) 12/22/2023   CO2 20 12/22/2023   Lab Results  Component Value Date   ALT 12 12/22/2023   AST 12 12/22/2023   ALKPHOS 103 12/22/2023   BILITOT 0.3 12/22/2023   Lab Results  Component Value Date   HGBA1C 5.4 12/22/2023   HGBA1C 5.6 06/15/2023   HGBA1C 5.7 (H) 01/12/2023   HGBA1C 5.7 (H) 09/17/2022   HGBA1C 5.4 03/18/2022   Lab Results  Component Value Date   INSULIN 11.7 12/22/2023   INSULIN 30.9 (H) 06/15/2023   INSULIN 8.3 09/17/2022   INSULIN 10.3 03/18/2022   INSULIN 4.9 01/14/2022   Lab Results  Component Value Date   TSH 1.470 01/05/2023   Lab Results  Component Value Date   CHOL 164 09/17/2022   HDL 67 09/17/2022   LDLCALC 86 09/17/2022   TRIG 51 09/17/2022   CHOLHDL 2.4 09/17/2022   Lab Results  Component Value Date   VD25OH 59.1 12/22/2023   VD25OH 49.6 06/15/2023   VD25OH 59.2 01/12/2023   Lab Results  Component Value Date   WBC 14.8 (H) 06/05/2023   HGB 13.1 06/05/2023   HCT 41.4 06/05/2023   MCV 84.8 06/05/2023   PLT 316 06/05/2023   No results found for: "IRON", "TIBC", "FERRITIN"  Attestation Statements:   Reviewed by clinician on day of visit: allergies, medications, problem list, medical history, surgical history, family history, social history, and previous encounter notes.  I have reviewed the above  documentation for accuracy and completeness, and I agree with the above. -  Ranson Belluomini d. Britton Bera, NP-C

## 2024-02-21 ENCOUNTER — Telehealth: Payer: Self-pay | Admitting: *Deleted

## 2024-02-21 NOTE — Telephone Encounter (Signed)
 Prior authorization approved for patients Wegovy. Message from plan: Request Reference Number: [id number]. WEGOVY INJ 0.5MG  is approved through 08/23/2024. For further questions, call [employer] at [phone].Marland Kitchen Authorization Expiration Date: August 23, 2024.

## 2024-02-21 NOTE — Telephone Encounter (Signed)
 Prior authorization done via cover my meds for patients. Wegovy. Waiting on determination.

## 2024-03-21 ENCOUNTER — Telehealth: Payer: Self-pay | Admitting: Neurology

## 2024-03-21 NOTE — Telephone Encounter (Signed)
Updated appt note

## 2024-03-21 NOTE — Telephone Encounter (Signed)
 Tiffany @ [facility] has called for the delivery of pt's Botox , delivery date is 4-23, address verified 200 units, quantity of 1, 34 day supply and SDV, this will be signature required.

## 2024-04-03 ENCOUNTER — Ambulatory Visit (INDEPENDENT_AMBULATORY_CARE_PROVIDER_SITE_OTHER): Admitting: Adult Health

## 2024-04-03 ENCOUNTER — Encounter (INDEPENDENT_AMBULATORY_CARE_PROVIDER_SITE_OTHER): Payer: Self-pay | Admitting: Adult Health

## 2024-04-03 VITALS — BP 102/69 | HR 61 | Temp 98.5°F | Ht 63.0 in | Wt 177.0 lb

## 2024-04-03 DIAGNOSIS — Z6831 Body mass index (BMI) 31.0-31.9, adult: Secondary | ICD-10-CM

## 2024-04-03 DIAGNOSIS — E669 Obesity, unspecified: Secondary | ICD-10-CM

## 2024-04-03 DIAGNOSIS — R7401 Elevation of levels of liver transaminase levels: Secondary | ICD-10-CM

## 2024-04-03 DIAGNOSIS — E88819 Insulin resistance, unspecified: Secondary | ICD-10-CM

## 2024-04-03 DIAGNOSIS — E538 Deficiency of other specified B group vitamins: Secondary | ICD-10-CM

## 2024-04-03 DIAGNOSIS — E559 Vitamin D deficiency, unspecified: Secondary | ICD-10-CM

## 2024-04-03 DIAGNOSIS — Z6841 Body Mass Index (BMI) 40.0 and over, adult: Secondary | ICD-10-CM

## 2024-04-03 MED ORDER — VITAMIN D (ERGOCALCIFEROL) 1.25 MG (50000 UNIT) PO CAPS
50000.0000 [IU] | ORAL_CAPSULE | ORAL | 0 refills | Status: DC
Start: 2024-04-03 — End: 2024-05-03

## 2024-04-03 MED ORDER — WEGOVY 0.5 MG/0.5ML ~~LOC~~ SOAJ: 0.5000 mg | SUBCUTANEOUS | 0 refills | Status: DC

## 2024-04-03 NOTE — Progress Notes (Signed)
 WEIGHT SUMMARY AND BIOMETRICS  Vitals Temp: 98.5 F (36.9 C) BP: 102/69 Pulse Rate: 61 SpO2: 91 %   Anthropometric Measurements Height: 5\' 3"  (1.6 m) Weight: 177 lb (80.3 kg) BMI (Calculated): 31.36 Weight at Last Visit: 179 LB Weight Lost Since Last Visit: 2 lb Weight Gained Since Last Visit: 0 Starting Weight: 237 LB Total Weight Loss (lbs): 60 lb (27.2 kg) Peak Weight: 267 LB   Body Composition  Body Fat %: 41.7 % Fat Mass (lbs): 73.8 lbs Muscle Mass (lbs): 98 lbs Total Body Water (lbs): 75.2 lbs Visceral Fat Rating : 9   Other Clinical Data Fasting: NO Labs: NO Today's Visit #: 72 Starting Date: 03/19/20    Chief Complaint:   OBESITY Lauren Garza is here to discuss her progress with her obesity treatment plan. She is on the the Category 2 Plan and states she is following her eating plan approximately 50 % of the time.  She states she is exercising: NEAT Activities  Interim History:  She started Wegovy  0.25mg  on/about 09/01/2023  10/13/2024 Wegovy  0.25mg  to 0.5mg - been maintained on lowest mx dose since then Denies mass in neck, dysphagia, dyspepsia, persistent hoarseness, abdominal pain, or N/V/C   Goal weight 185 lbs, current weight 177 lbs She is agreeable to beginning Mx phase at next OV Will check IC and Fasting Labs   03/19/20 08:00  Height 5\' 3"  (1.6 m)  Weight 237 lb (107.5 kg)  BMI (Calculated) 41.99    03/19/20 08:00  RMR 1338    04/03/24 09:00  Height 5\' 3"  (1.6 m)  Weight 177 lb (80.3 kg)  BMI (Calculated) 31.36   Subjective:   1. Transaminitis She denies RUQ pain.  She denies frequent Acetaminophen  or ETOH use  2. Insulin  resistance She started Wegovy  0.25mg  on/about 09/01/2023  10/13/2024 Wegovy  0.25mg  to 0.5mg - been maintained on lowest mx dose since then Denies mass in neck, dysphagia, dyspepsia, persistent hoarseness, abdominal pain, or N/V/C   3. Vitamin D  deficiency  Latest Reference Range & Units 06/15/23 10:17  12/22/23 10:52  Vitamin D , 25-Hydroxy 30.0 - 100.0 ng/mL 49.6 59.1   She is on bi-weekly Ergocalciferol - denies N/V/Muscle Weakness  4. Vitamin B12 deficiency  Latest Reference Range & Units 06/15/23 10:17 12/22/23 10:52  Vitamin B12 232 - 1,245 pg/mL >2000 (H) 843  (H): Data is abnormally high  She is on monthly B12 injections- managed by PCP  Assessment/Plan:   1. Transaminitis (Primary) Avoid Hepatoxic substances Check Labs  2. Insulin  resistance Increase protein at meals and snacks Limit sugar/simple CHO Continue weekly Wegovy  therapy  3. Vitamin D  deficiency Refill Vitamin D , Ergocalciferol , (DRISDOL ) 1.25 MG (50000 UNIT) CAPS capsule Take 1 capsule (50,000 Units total) by mouth every 14 (fourteen) days. Dispense: 4 capsule, Refills: 0 ordered   4. Vitamin B12 deficiency Check Labs at next OV  5. Obesity, Current BMI 31.4 Refill Semaglutide -Weight Management (WEGOVY ) 0.5 MG/0.5ML SOAJ Inject 0.5 mg into the skin once a week. Dispense: 2 mL, Refills: 0 ordered   [name] is currently in the action stage of change. As such, her goal is to maintain weight for now. She has agreed to the Category 2 Plan.   Exercise goals: For substantial health benefits, adults should do at least 150 minutes (2 hours and 30 minutes) a week of moderate-intensity, or 75 minutes (1 hour and 15 minutes) a week of vigorous-intensity aerobic physical activity, or an equivalent combination of moderate- and vigorous-intensity aerobic activity. Aerobic activity should be performed  in episodes of at least 10 minutes, and preferably, it should be spread throughout the week.  Behavioral modification strategies: increasing lean protein intake, decreasing simple carbohydrates, increasing vegetables, increasing water intake, no skipping meals, meal planning and cooking strategies, keeping healthy foods in the home, ways to avoid boredom eating, and planning for success.  [name] has agreed to follow-up  with our clinic in 4 weeks. She was informed of the importance of frequent follow-up visits to maximize her success with intensive lifestyle modifications for her multiple health conditions.   Check Fasting Labs and IC at next OV- pt aware to arrive early and to be fasting  Objective:   Blood pressure 102/69, pulse 61, temperature 98.5 F (36.9 C), height 5\' 3"  (1.6 m), weight 177 lb (80.3 kg), last menstrual period 02/11/2018, SpO2 91%. Body mass index is 31.35 kg/m.  General: Cooperative, alert, well developed, in no acute distress. HEENT: Conjunctivae and lids unremarkable. Cardiovascular: Regular rhythm.  Lungs: Normal work of breathing. Neurologic: No focal deficits.   Lab Results  Component Value Date   CREATININE 0.99 12/22/2023   BUN 11 12/22/2023   NA 141 12/22/2023   K 4.5 12/22/2023   CL 108 (H) 12/22/2023   CO2 20 12/22/2023   Lab Results  Component Value Date   ALT 12 12/22/2023   AST 12 12/22/2023   ALKPHOS 103 12/22/2023   BILITOT 0.3 12/22/2023   Lab Results  Component Value Date   HGBA1C 5.4 12/22/2023   HGBA1C 5.6 06/15/2023   HGBA1C 5.7 (H) 01/12/2023   HGBA1C 5.7 (H) 09/17/2022   HGBA1C 5.4 03/18/2022   Lab Results  Component Value Date   INSULIN  11.7 12/22/2023   INSULIN  30.9 (H) 06/15/2023   INSULIN  8.3 09/17/2022   INSULIN  10.3 03/18/2022   INSULIN  4.9 01/14/2022   Lab Results  Component Value Date   TSH 1.470 01/05/2023   Lab Results  Component Value Date   CHOL 164 09/17/2022   HDL 67 09/17/2022   LDLCALC 86 09/17/2022   TRIG 51 09/17/2022   CHOLHDL 2.4 09/17/2022   Lab Results  Component Value Date   VD25OH 59.1 12/22/2023   VD25OH 49.6 06/15/2023   VD25OH 59.2 01/12/2023   Lab Results  Component Value Date   WBC 14.8 (H) 06/05/2023   HGB 13.1 06/05/2023   HCT 41.4 06/05/2023   MCV 84.8 06/05/2023   PLT 316 06/05/2023   No results found for: "IRON", "TIBC", "FERRITIN"  Attestation Statements:   Reviewed by  clinician on day of visit: allergies, medications, problem list, medical history, surgical history, family history, social history, and previous encounter notes.  I have reviewed the above documentation for accuracy and completeness, and I agree with the above. -  Charisa Twitty d. Arriyah Madej, NP-C

## 2024-04-25 ENCOUNTER — Ambulatory Visit: Payer: Medicaid Other | Admitting: Neurology

## 2024-04-25 VITALS — BP 112/78 | HR 72

## 2024-04-25 DIAGNOSIS — G43709 Chronic migraine without aura, not intractable, without status migrainosus: Secondary | ICD-10-CM

## 2024-04-25 MED ORDER — ONABOTULINUMTOXINA 200 UNITS IJ SOLR
155.0000 [IU] | Freq: Once | INTRAMUSCULAR | Status: AC
  Administered 2024-04-25: 155 [IU] via INTRAMUSCULAR

## 2024-04-25 NOTE — Progress Notes (Signed)
 04/25/2024: DO NOT INJECT CORRUGATORS or PROCERUS. > 90% improvement in migraine and headache frwqnecy. Temporalis high behind the hairline  01/27/24: This is patient's first Botox .  I reviewed potential side effects of Botox  with the patient and reviewed the procedure itself. Consent signed. Patient is concerned about possible asymmetry on the face.  She would like to avoid injecting the face.  She does have a hair piece on today.  She was able to remove it so we could proceed with Botox .  However it was hard to visualize where to inject in the occipital region and cervical paraspinal muscles.  Dr. Narciso Backers did come into the visit and did the injections in those regions  Consent Form Botulism Toxin Injection For Chronic Migraine    Reviewed orally with patient, additionally signature is on file:  Botulism toxin has been approved by the Federal drug administration for treatment of chronic migraine. Botulism toxin does not cure chronic migraine and it may not be effective in some patients.  The administration of botulism toxin is accomplished by injecting a small amount of toxin into the muscles of the neck and head. Dosage must be titrated for each individual. Any benefits resulting from botulism toxin tend to wear off after 3 months with a repeat injection required if benefit is to be maintained. Injections are usually done every 3-4 months with maximum effect peak achieved by about 2 or 3 weeks. Botulism toxin is expensive and you should be sure of what costs you will incur resulting from the injection.  The side effects of botulism toxin use for chronic migraine may include:   -Transient, and usually mild, facial weakness with facial injections  -Transient, and usually mild, head or neck weakness with head/neck injections  -Reduction or loss of forehead facial animation due to forehead muscle weakness  -Eyelid drooping  -Dry eye  -Pain at the site of injection or bruising at the site of  injection  -Double vision  -Potential unknown long term risks  Contraindications: You should not have Botox  if you are pregnant, nursing, allergic to albumin, have an infection, skin condition, or muscle weakness at the site of the injection, or have myasthenia gravis, Lambert-Eaton syndrome, or ALS.  It is also possible that as with any injection, there may be an allergic reaction or no effect from the medication. Reduced effectiveness after repeated injections is sometimes seen and rarely infection at the injection site may occur. All care will be taken to prevent these side effects. If therapy is given over a long time, atrophy and wasting in the muscle injected may occur. Occasionally the patient's become refractory to treatment because they develop antibodies to the toxin. In this event, therapy needs to be modified.  I have read the above information and consent to the administration of botulism toxin.    BOTOX  PROCEDURE NOTE FOR MIGRAINE HEADACHE    Contraindications and precautions discussed with patient(above). Aseptic procedure was observed and patient tolerated procedure. Procedure performed by Dr. Criselda Dolly  The condition has existed for more than 6 months, and pt does not have a diagnosis of ALS, Myasthenia Gravis or Lambert-Eaton Syndrome.  Risks and benefits of injections discussed and pt agrees to proceed with the procedure.  Written consent obtained  These injections are medically necessary. Pt  receives good benefits from these injections. These injections do not cause sedations or hallucinations which the oral therapies may cause.  Description of procedure:  The patient was placed in a sitting position. The standard  protocol was used for Botox  as follows, with 5 units of Botox  injected at each site:    -Frontalis muscle, bilateral injection, with 2 sites each side, medial injection was performed in the upper one third of the frontalis muscle, in the region vertical  from the medial inferior edge of the superior orbital rim. The lateral injection was again in the upper one third of the forehead vertically above the lateral limbus of the cornea, 1.5 cm lateral to the medial injection site.  -Temporalis muscle injection, 4 sites, bilaterally. The first injection was 3 cm above the tragus of the ear, second injection site was 1.5 cm to 3 cm up from the first injection site in line with the tragus of the ear. The third injection site was 1.5-3 cm forward between the first 2 injection sites. The fourth injection site was 1.5 cm posterior to the second injection site.   -Occipitalis muscle injection, 3 sites, bilaterally. The first injection was done one half way between the occipital protuberance and the tip of the mastoid process behind the ear. The second injection site was done lateral and superior to the first, 1 fingerbreadth from the first injection. The third injection site was 1 fingerbreadth superiorly and medially from the first injection site.  -Cervical paraspinal muscle injection, 2 sites, bilateral knee first injection site was 1 cm from the midline of the cervical spine, 3 cm inferior to the lower border of the occipital protuberance. The second injection site was 1.5 cm superiorly and laterally to the first injection site.  -Trapezius muscle injection was performed at 3 sites, bilaterally. The first injection site was in the upper trapezius muscle halfway between the inflection point of the neck, and the acromion. The second injection site was one half way between the acromion and the first injection site. The third injection was done between the first injection site and the inflection point of the neck.   Will return for repeat injection in 3 months.   200 units of Botox  was used, 60 Botox  not injected was wasted. The patient tolerated the procedure well, there were no complications of the above procedure.

## 2024-04-25 NOTE — Progress Notes (Signed)
 Botox - 200 units x 1 vial Lot: [id number] Expiration: 07/2026 NDC: 4098-1191-47  Bacteriostatic 0.9% Sodium Chloride - 4 mL  Lot: [id number] Expiration: 09/30/2024 NDC: 1308-6578-46  Dx: N62.952 S/P  Witnessed by Logan Rings RN

## 2024-04-27 ENCOUNTER — Other Ambulatory Visit (INDEPENDENT_AMBULATORY_CARE_PROVIDER_SITE_OTHER): Payer: Self-pay | Admitting: Adult Health

## 2024-04-27 ENCOUNTER — Telehealth (INDEPENDENT_AMBULATORY_CARE_PROVIDER_SITE_OTHER): Payer: Self-pay | Admitting: Adult Health

## 2024-04-27 DIAGNOSIS — J3089 Other allergic rhinitis: Secondary | ICD-10-CM

## 2024-04-27 MED ORDER — LEVOCETIRIZINE DIHYDROCHLORIDE 5 MG PO TABS
5.0000 mg | ORAL_TABLET | Freq: Every evening | ORAL | 0 refills | Status: DC
Start: 2024-04-27 — End: 2024-07-03

## 2024-04-27 NOTE — Telephone Encounter (Signed)
 Patient called requesting a refill of levocetirizine from [facility]. Patient states that she is having severe allergy issues, and is out of levocetirizine. Patient also states that Dr. Carolene Chute had prescribed the medication for the patient originally. Patient sees Acie Holiday regularly.

## 2024-05-02 ENCOUNTER — Ambulatory Visit: Payer: Medicaid Other | Admitting: Adult Health

## 2024-05-03 ENCOUNTER — Encounter (INDEPENDENT_AMBULATORY_CARE_PROVIDER_SITE_OTHER): Payer: Self-pay | Admitting: Adult Health

## 2024-05-03 ENCOUNTER — Ambulatory Visit (INDEPENDENT_AMBULATORY_CARE_PROVIDER_SITE_OTHER): Admitting: Adult Health

## 2024-05-03 VITALS — BP 107/70 | HR 85 | Temp 98.1°F | Ht 63.0 in | Wt 175.0 lb

## 2024-05-03 DIAGNOSIS — E538 Deficiency of other specified B group vitamins: Secondary | ICD-10-CM

## 2024-05-03 DIAGNOSIS — E88819 Insulin resistance, unspecified: Secondary | ICD-10-CM | POA: Diagnosis not present

## 2024-05-03 DIAGNOSIS — R7401 Elevation of levels of liver transaminase levels: Secondary | ICD-10-CM | POA: Diagnosis not present

## 2024-05-03 DIAGNOSIS — E559 Vitamin D deficiency, unspecified: Secondary | ICD-10-CM | POA: Diagnosis not present

## 2024-05-03 DIAGNOSIS — Z6831 Body mass index (BMI) 31.0-31.9, adult: Secondary | ICD-10-CM

## 2024-05-03 DIAGNOSIS — E669 Obesity, unspecified: Secondary | ICD-10-CM

## 2024-05-03 DIAGNOSIS — J324 Chronic pansinusitis: Secondary | ICD-10-CM

## 2024-05-03 DIAGNOSIS — E66813 Obesity, class 3: Secondary | ICD-10-CM

## 2024-05-03 MED ORDER — VITAMIN D (ERGOCALCIFEROL) 1.25 MG (50000 UNIT) PO CAPS: 50000.0000 [IU] | ORAL_CAPSULE | ORAL | 0 refills | Status: DC

## 2024-05-03 MED ORDER — WEGOVY 0.5 MG/0.5ML ~~LOC~~ SOAJ: 0.5000 mg | SUBCUTANEOUS | 0 refills | Status: DC

## 2024-05-03 NOTE — Progress Notes (Signed)
 WEIGHT SUMMARY AND BIOMETRICS  Vitals Temp: 98.1 F (36.7 C) BP: 107/70 Pulse Rate: 85 SpO2: 98 %   Anthropometric Measurements Height: 5\' 3"  (1.6 m) Weight: 175 lb (79.4 kg) BMI (Calculated): 31.01 Weight at Last Visit: 177 lb Weight Lost Since Last Visit: 2 lb Weight Gained Since Last Visit: 0 Starting Weight: 237 lb Total Weight Loss (lbs): 62 lb (28.1 kg) Peak Weight: 267 lb   Body Composition  Body Fat %: 42.5 % Fat Mass (lbs): 74.4 lbs Muscle Mass (lbs): 95.6 lbs Total Body Water (lbs): 77.2 lbs Visceral Fat Rating : 9   Other Clinical Data Fasting: yes Labs: yes Today's Visit #: 18 Starting Date: 03/19/20    Chief Complaint:   OBESITY Lauren Garza is here to discuss her progress with her obesity treatment plan.  She is on the the Category 2 Plan and states she is following her eating plan approximately 50 % of the time.  She states she is exercising: NEAT Activities  Interim History:  She administers weekly Wegovy  0.5mg  Wednesday She has remained on lowest mx dose due to low appetite Denies mass in neck, dysphagia, dyspepsia, persistent hoarseness, abdominal pain, or N/V/C   Stress- her boyfriend recently lost two close friends to violence. He is struggling to cope with the sudden grief.  No formal exercise, however performs household cleaning  Subjective:   1. Transaminitis She denies RUQ pain She rarely consumes ETOH  2. Insulin  resistance  Latest Reference Range & Units 06/15/23 10:17 12/22/23 10:52  INSULIN  2.6 - 24.9 uIU/mL 30.9 (H) 11.7  (H): Data is abnormally high Lab Results  Component Value Date   HGBA1C 5.4 12/22/2023   HGBA1C 5.6 06/15/2023   HGBA1C 5.7 (H) 01/12/2023    She administers weekly Wegovy  0.5mg  Wednesday She has remained on lowest mx dose due to low appetite Denies mass in neck, dysphagia, dyspepsia, persistent hoarseness, abdominal pain, or N/V/C   3. Vitamin B12 deficiency She endorses stable energy  levels  4. Vitamin D  deficiency  Latest Reference Range & Units 01/12/23 10:15 06/15/23 10:17  Vitamin D , 25-Hydroxy 30.0 - 100.0 ng/mL 59.2 49.6   She endorses stable energy levels  5. Chronic pansinusitis She endorses worsening congestion and facial pressure. She has not been seen by her established ENT since 2023? She denies respiratory distress  Assessment/Plan:   1. Transaminitis (Primary) Check Labs - Comprehensive metabolic panel with GFR  2. Insulin  resistance Check Labs - Hemoglobin A1c - Insulin , random  3. Vitamin B12 deficiency Check Labs - Vitamin B12  4. Vitamin D  deficiency Check Labs - VITAMIN D  25 Hydroxy (Vit-D Deficiency, Fractures) Refill - Vitamin D , Ergocalciferol , (DRISDOL ) 1.25 MG (50000 UNIT) CAPS capsule; Take 1 capsule (50,000 Units total) by mouth every 14 (fourteen) days.  Dispense: 4 capsule; Refill: 0  5. Chronic pansinusitis Increase hydration Avoid known allergens Return to ENT  6. Obesity, Current BMI 31.0 Refill Semaglutide -Weight Management (WEGOVY ) 0.5 MG/0.5ML SOAJ Inject 0.5 mg into the skin once a week. Dispense: 2 mL, Refills: 0 ordered   05/03/2024 -- Lauren Garza, Barkley Li, NP                              Lauren Garza is currently in the action stage of change. As such, her goal is to continue with weight loss efforts. She has agreed to the Category 2 Plan.   Exercise goals: All adults should avoid inactivity. Some physical activity  is better than none, and adults who participate in any amount of physical activity gain some health benefits. Adults should also include muscle-strengthening activities that involve all major muscle groups on 2 or more days a week.  Behavioral modification strategies: increasing lean protein intake, decreasing simple carbohydrates, increasing vegetables, increasing water intake, no skipping meals, meal planning and cooking strategies, keeping healthy foods in the home, ways to avoid boredom eating, and  planning for success.  Lauren Garza has agreed to follow-up with our clinic in 4 weeks. She was informed of the importance of frequent follow-up visits to maximize her success with intensive lifestyle modifications for her multiple health conditions.   Lauren Garza was informed we would discuss her lab results at her next visit unless there is a critical issue that needs to be addressed sooner. Lauren Garza agreed to keep her next visit at the agreed upon time to discuss these results.  Objective:   Blood pressure 107/70, pulse 85, temperature 98.1 F (36.7 C), height 5\' 3"  (1.6 m), weight 175 lb (79.4 kg), last menstrual period 02/11/2018, SpO2 98%. Body mass index is 31 kg/m.  General: Cooperative, alert, well developed, in no acute distress. HEENT: Conjunctivae and lids unremarkable. Cardiovascular: Regular rhythm.  Lungs: Normal work of breathing. Neurologic: No focal deficits.   Lab Results  Component Value Date   CREATININE 0.99 12/22/2023   BUN 11 12/22/2023   NA 141 12/22/2023   K 4.5 12/22/2023   CL 108 (H) 12/22/2023   CO2 20 12/22/2023   Lab Results  Component Value Date   ALT 12 12/22/2023   AST 12 12/22/2023   ALKPHOS 103 12/22/2023   BILITOT 0.3 12/22/2023   Lab Results  Component Value Date   HGBA1C 5.4 12/22/2023   HGBA1C 5.6 06/15/2023   HGBA1C 5.7 (H) 01/12/2023   HGBA1C 5.7 (H) 09/17/2022   HGBA1C 5.4 03/18/2022   Lab Results  Component Value Date   INSULIN  11.7 12/22/2023   INSULIN  30.9 (H) 06/15/2023   INSULIN  8.3 09/17/2022   INSULIN  10.3 03/18/2022   INSULIN  4.9 01/14/2022   Lab Results  Component Value Date   TSH 1.470 01/05/2023   Lab Results  Component Value Date   CHOL 164 09/17/2022   HDL 67 09/17/2022   LDLCALC 86 09/17/2022   TRIG 51 09/17/2022   CHOLHDL 2.4 09/17/2022   Lab Results  Component Value Date   VD25OH 59.1 12/22/2023   VD25OH 49.6 06/15/2023   VD25OH 59.2 01/12/2023   Lab Results  Component Value Date   WBC 14.8  (H) 06/05/2023   HGB 13.1 06/05/2023   HCT 41.4 06/05/2023   MCV 84.8 06/05/2023   PLT 316 06/05/2023   No results found for: "IRON", "TIBC", "FERRITIN"  Attestation Statements:   Reviewed by clinician on day of visit: allergies, medications, problem list, medical history, surgical history, family history, social history, and previous encounter notes.  I have reviewed the above documentation for accuracy and completeness, and I agree with the above. -  Ilya Neely d. Benedetto Ryder, NP-C

## 2024-05-05 LAB — COMPREHENSIVE METABOLIC PANEL WITH GFR
ALT: 12 IU/L (ref 0–32)
AST: 14 IU/L (ref 0–40)
Albumin: 4.2 g/dL (ref 3.9–4.9)
Alkaline Phosphatase: 88 IU/L (ref 44–121)
BUN/Creatinine Ratio: 13 (ref 9–23)
BUN: 10 mg/dL (ref 6–24)
Bilirubin Total: 0.4 mg/dL (ref 0.0–1.2)
CO2: 21 mmol/L (ref 20–29)
Calcium: 9 mg/dL (ref 8.7–10.2)
Chloride: 105 mmol/L (ref 96–106)
Creatinine, Ser: 0.75 mg/dL (ref 0.57–1.00)
Globulin, Total: 2.4 g/dL (ref 1.5–4.5)
Glucose: 80 mg/dL (ref 70–99)
Potassium: 4.2 mmol/L (ref 3.5–5.2)
Sodium: 142 mmol/L (ref 134–144)
Total Protein: 6.6 g/dL (ref 6.0–8.5)
eGFR: 103 mL/min/{1.73_m2} (ref >59)

## 2024-05-05 LAB — HEMOGLOBIN A1C
Est. average glucose Bld gHb Est-mCnc: 100 mg/dL
Hgb A1c MFr Bld: 5.1 % (ref 4.8–5.6)

## 2024-05-05 LAB — INSULIN, RANDOM: INSULIN: 6.5 u[IU]/mL (ref 2.6–24.9)

## 2024-05-05 LAB — VITAMIN B12: Vitamin B-12: 814 pg/mL (ref 232–1245)

## 2024-05-05 LAB — VITAMIN D 25 HYDROXY (VIT D DEFICIENCY, FRACTURES): Vit D, 25-Hydroxy: 66.5 ng/mL (ref 30.0–100.0)

## 2024-05-10 ENCOUNTER — Other Ambulatory Visit (INDEPENDENT_AMBULATORY_CARE_PROVIDER_SITE_OTHER): Payer: Self-pay | Admitting: Adult Health

## 2024-05-10 ENCOUNTER — Encounter (INDEPENDENT_AMBULATORY_CARE_PROVIDER_SITE_OTHER): Payer: Self-pay | Admitting: Adult Health

## 2024-05-10 DIAGNOSIS — E559 Vitamin D deficiency, unspecified: Secondary | ICD-10-CM

## 2024-05-10 MED ORDER — VITAMIN D (ERGOCALCIFEROL) 1.25 MG (50000 UNIT) PO CAPS: 50000.0000 [IU] | ORAL_CAPSULE | ORAL | 0 refills | Status: DC

## 2024-05-10 NOTE — Telephone Encounter (Signed)
**Note De-identified  [place] Obfuscation** Please advise 

## 2024-05-10 NOTE — Telephone Encounter (Signed)
 Vit D 66.5

## 2024-06-05 ENCOUNTER — Encounter (INDEPENDENT_AMBULATORY_CARE_PROVIDER_SITE_OTHER): Payer: Self-pay | Admitting: Adult Health

## 2024-06-05 ENCOUNTER — Ambulatory Visit (INDEPENDENT_AMBULATORY_CARE_PROVIDER_SITE_OTHER): Admitting: Adult Health

## 2024-06-05 VITALS — BP 101/68 | HR 74 | Temp 97.9°F | Ht 63.0 in | Wt 177.0 lb

## 2024-06-05 DIAGNOSIS — E669 Obesity, unspecified: Secondary | ICD-10-CM

## 2024-06-05 DIAGNOSIS — E559 Vitamin D deficiency, unspecified: Secondary | ICD-10-CM | POA: Diagnosis not present

## 2024-06-05 DIAGNOSIS — Z6831 Body mass index (BMI) 31.0-31.9, adult: Secondary | ICD-10-CM

## 2024-06-05 DIAGNOSIS — R7401 Elevation of levels of liver transaminase levels: Secondary | ICD-10-CM | POA: Diagnosis not present

## 2024-06-05 DIAGNOSIS — J324 Chronic pansinusitis: Secondary | ICD-10-CM | POA: Diagnosis not present

## 2024-06-05 DIAGNOSIS — E88819 Insulin resistance, unspecified: Secondary | ICD-10-CM | POA: Diagnosis not present

## 2024-06-05 DIAGNOSIS — R0602 Shortness of breath: Secondary | ICD-10-CM | POA: Diagnosis not present

## 2024-06-05 DIAGNOSIS — Z6841 Body Mass Index (BMI) 40.0 and over, adult: Secondary | ICD-10-CM

## 2024-06-05 MED ORDER — WEGOVY 0.5 MG/0.5ML ~~LOC~~ SOAJ: 0.5000 mg | SUBCUTANEOUS | 0 refills | Status: DC

## 2024-06-05 MED ORDER — MONTELUKAST SODIUM 10 MG PO TABS: 10.0000 mg | ORAL_TABLET | Freq: Every day | ORAL | 0 refills | Status: AC

## 2024-06-05 MED ORDER — VITAMIN D (ERGOCALCIFEROL) 1.25 MG (50000 UNIT) PO CAPS: 50000.0000 [IU] | ORAL_CAPSULE | ORAL | 0 refills | Status: DC

## 2024-06-05 NOTE — Progress Notes (Signed)
 WEIGHT SUMMARY AND BIOMETRICS  Vitals Temp: 97.9 F (36.6 C) BP: 101/68 Pulse Rate: 74 SpO2: 100 %   Anthropometric Measurements Height: 5' 3 (1.6 m) Weight: 177 lb (80.3 kg) BMI (Calculated): 31.36 Weight at Last Visit: 175lb Weight Lost Since Last Visit: 0 Weight Gained Since Last Visit: 2lb Starting Weight: 237lb Total Weight Loss (lbs): 60 lb (27.2 kg) Peak Weight: 267lb   Body Composition  Body Fat %: 39.7 % Fat Mass (lbs): 70.6 lbs Muscle Mass (lbs): 101.8 lbs Total Body Water (lbs): 76.4 lbs Visceral Fat Rating : 8   Other Clinical Data Fasting: yes Labs: yes Today's Visit #: 73 Starting Date: 03/19/20    Chief Complaint:   OBESITY Lauren Garza is here to discuss her progress with her obesity treatment plan.  She is on the the Category 2 Plan and states she is following her eating plan approximately 25 % of the time.  She states she is exercising : Daily Walking at work- [employer] and [employer]  Interim History:  [name] provided the following food recall that is is typical of a day: Breakfast: [place]- skinny biscuit with with either chicken or sausage Lunch: often skips due to hectic work schedule Afternoon snack: protein shake Dinner: Meat protein with grilled vegetables  Reviewed Bioimpedance results with pt: Muscle Mass: +6.2 lbs Adipose Mass: -3.8 lbs  Subjective:   1. SOB (shortness of breath) on exertion She endorses dyspnea with extreme exertion, denies CP  03/19/20 08:00  RMR 1338    09/26/20 09:00  RMR 1665    03/18/22 09:00  RMR 1901    06/05/24 08:00  RMR 1858   Metabolism is stable and well above anticipated RMR of 1492 She reports more walking the last several weeks and subsequent increase appetite  2. Vitamin D  deficiency Discussed Labs  Latest Reference Range & Units 05/03/24 09:55  Vitamin D , 25-Hydroxy 30.0 - 100.0 ng/mL 66.5       Vit D level at goal She is on Ergocalciferol  Q14 days She denies  N/V/Muscle Weakness  3. Transaminitis Discussed Labs  Latest Reference Range & Units 05/03/24 09:55  Alkaline Phosphatase 44 - 121 IU/L 88  Albumin 3.9 - 4.9 g/dL 4.2  AST 0 - 40 IU/L 14  ALT 0 - 32 IU/L 12   Liver enzymes are at goal She denies RUQ pain  4. Insulin  resistance Discussed Labs  Latest Reference Range & Units 05/03/24 09:55  Glucose 70 - 99 mg/dL 80  Hemoglobin J8R 4.8 - 5.6 % 5.1  Est. average glucose Bld gHb Est-mCnc mg/dL 899  INSULIN  2.6 - 24.9 uIU/mL 6.5   Vitamin B12 232 - 1,245 pg/mL 814   Insulin  steadily improving and almost at goal of <5 A1c and CBF both at goal She is on weekly Wegovy  0.5mg  Denies mass in neck, dysphagia, dyspepsia, persistent hoarseness, abdominal pain, or N/V/C   B12 level stable  5. Chronic pansinusitis Her established allergist, Dr. Brian Shin, recently passed away. Her next f/u at that practice is a few month off. She is requesting a bridge refill of Singulair  10mg  She reports stable mood, denies SI/HI  Assessment/Plan:   1. SOB (shortness of breath) on exertion Check IC Increase snack calories to 300 per day Increase daily protein intake  2. Vitamin D  deficiency (Primary) Refill  Vitamin D , Ergocalciferol , (DRISDOL ) 1.25 MG (50000 UNIT) CAPS capsule Take 1 capsule (50,000 Units total) by mouth every 14 (fourteen) days. Dispense: 8 capsule, Refills: 0 ordered  3. Transaminitis Continue with weight loss efforts Monitor Labs  4. Insulin  resistance Continue healthy eating  5. Chronic pansinusitis BRIDGE REFILL montelukast  (SINGULAIR ) 10 MG tablet Take 1 tablet (10 mg total) by mouth at bedtime. Dispense: 90 tablet, Refills: 0 ordered   6. Obesity, Current BMI 31.0 Refill   Semaglutide -Weight Management (WEGOVY ) 0.5 MG/0.5ML SOAJ Inject 0.5 mg into the skin once a week. Dispense: 2 mL, Refills: 0 ordered   [name] is currently in the action stage of change. As such, her goal is to continue with weight loss  efforts. She has agreed to the Category 2 Plan. +100  Exercise goals: All adults should avoid inactivity. Some physical activity is better than none, and adults who participate in any amount of physical activity gain some health benefits. Adults should also include muscle-strengthening activities that involve all major muscle groups on 2 or more days a week.  Behavioral modification strategies: increasing lean protein intake, decreasing simple carbohydrates, increasing vegetables, increasing water intake, no skipping meals, meal planning and cooking strategies, keeping healthy foods in the home, ways to avoid boredom eating, and planning for success.  Danaja has agreed to follow-up with our clinic in 4 weeks. She was informed of the importance of frequent follow-up visits to maximize her success with intensive lifestyle modifications for her multiple health conditions.   Objective:   Blood pressure 101/68, pulse 74, temperature 97.9 F (36.6 C), height 5' 3 (1.6 m), weight 177 lb (80.3 kg), last menstrual period 02/11/2018, SpO2 100%. Body mass index is 31.35 kg/m.  General: Cooperative, alert, well developed, in no acute distress. HEENT: Conjunctivae and lids unremarkable. Cardiovascular: Regular rhythm.  Lungs: Normal work of breathing. Neurologic: No focal deficits.   Lab Results  Component Value Date   CREATININE 0.75 05/03/2024   BUN 10 05/03/2024   NA 142 05/03/2024   K 4.2 05/03/2024   CL 105 05/03/2024   CO2 21 05/03/2024   Lab Results  Component Value Date   ALT 12 05/03/2024   AST 14 05/03/2024   ALKPHOS 88 05/03/2024   BILITOT 0.4 05/03/2024   Lab Results  Component Value Date   HGBA1C 5.1 05/03/2024   HGBA1C 5.4 12/22/2023   HGBA1C 5.6 06/15/2023   HGBA1C 5.7 (H) 01/12/2023   HGBA1C 5.7 (H) 09/17/2022   Lab Results  Component Value Date   INSULIN  6.5 05/03/2024   INSULIN  11.7 12/22/2023   INSULIN  30.9 (H) 06/15/2023   INSULIN  8.3 09/17/2022   INSULIN   10.3 03/18/2022   Lab Results  Component Value Date   TSH 1.470 01/05/2023   Lab Results  Component Value Date   CHOL 164 09/17/2022   HDL 67 09/17/2022   LDLCALC 86 09/17/2022   TRIG 51 09/17/2022   CHOLHDL 2.4 09/17/2022   Lab Results  Component Value Date   VD25OH 66.5 05/03/2024   VD25OH 59.1 12/22/2023   VD25OH 49.6 06/15/2023   Lab Results  Component Value Date   WBC 14.8 (H) 06/05/2023   HGB 13.1 06/05/2023   HCT 41.4 06/05/2023   MCV 84.8 06/05/2023   PLT 316 06/05/2023   No results found for: IRON, TIBC, FERRITIN  Attestation Statements:   Reviewed by clinician on day of visit: allergies, medications, problem list, medical history, surgical history, family history, social history, and previous encounter notes.  I have reviewed the above documentation for accuracy and completeness, and I agree with the above. -  Lasondra Hodgkins d. Wyndell Cardiff, NP-C

## 2024-06-09 ENCOUNTER — Other Ambulatory Visit: Payer: Self-pay | Admitting: Neurology

## 2024-06-09 DIAGNOSIS — G932 Benign intracranial hypertension: Secondary | ICD-10-CM

## 2024-06-13 ENCOUNTER — Encounter: Payer: Self-pay | Admitting: *Deleted

## 2024-06-13 NOTE — Telephone Encounter (Signed)
 Last Rx from 07/22/23 says goal is 200 mg nightly. I talked to pt (see mychart). She only takes 100 mg at bedtime. States she has 23 tablets left. Will get approval from Dr Ines when back in office this week since this involves a prescription change.

## 2024-06-20 ENCOUNTER — Telehealth: Payer: Self-pay | Admitting: Neurology

## 2024-06-20 DIAGNOSIS — G43709 Chronic migraine without aura, not intractable, without status migrainosus: Secondary | ICD-10-CM

## 2024-06-20 NOTE — Telephone Encounter (Signed)
 Received approval, pt will continue to fill through [facility].  Auth#: [id number] (06/20/24-06/20/25)

## 2024-06-20 NOTE — Telephone Encounter (Signed)
 Submitted auth request via CMM, status is pending. Key: [id number]

## 2024-07-03 ENCOUNTER — Ambulatory Visit (INDEPENDENT_AMBULATORY_CARE_PROVIDER_SITE_OTHER): Admitting: Adult Health

## 2024-07-03 ENCOUNTER — Encounter (INDEPENDENT_AMBULATORY_CARE_PROVIDER_SITE_OTHER): Payer: Self-pay | Admitting: Adult Health

## 2024-07-03 VITALS — BP 119/81 | HR 82 | Temp 98.5°F | Ht 63.0 in | Wt 179.0 lb

## 2024-07-03 DIAGNOSIS — E88819 Insulin resistance, unspecified: Secondary | ICD-10-CM

## 2024-07-03 DIAGNOSIS — J302 Other seasonal allergic rhinitis: Secondary | ICD-10-CM

## 2024-07-03 DIAGNOSIS — E559 Vitamin D deficiency, unspecified: Secondary | ICD-10-CM

## 2024-07-03 DIAGNOSIS — J3089 Other allergic rhinitis: Secondary | ICD-10-CM

## 2024-07-03 DIAGNOSIS — Z6831 Body mass index (BMI) 31.0-31.9, adult: Secondary | ICD-10-CM

## 2024-07-03 DIAGNOSIS — E669 Obesity, unspecified: Secondary | ICD-10-CM

## 2024-07-03 DIAGNOSIS — F439 Reaction to severe stress, unspecified: Secondary | ICD-10-CM

## 2024-07-03 DIAGNOSIS — Z6841 Body Mass Index (BMI) 40.0 and over, adult: Secondary | ICD-10-CM

## 2024-07-03 MED ORDER — WEGOVY 0.5 MG/0.5ML ~~LOC~~ SOAJ: 0.5000 mg | SUBCUTANEOUS | 0 refills | Status: DC

## 2024-07-03 MED ORDER — LEVOCETIRIZINE DIHYDROCHLORIDE 5 MG PO TABS: 5.0000 mg | ORAL_TABLET | Freq: Every evening | ORAL | 0 refills | Status: AC

## 2024-07-03 MED ORDER — VITAMIN D (ERGOCALCIFEROL) 1.25 MG (50000 UNIT) PO CAPS: 50000.0000 [IU] | ORAL_CAPSULE | ORAL | 0 refills | Status: DC

## 2024-07-03 NOTE — Progress Notes (Signed)
 WEIGHT SUMMARY AND BIOMETRICS  Vitals Temp: 98.5 F (36.9 C) BP: 119/81 Pulse Rate: 82 SpO2: 100 %   Anthropometric Measurements Height: 5' 3 (1.6 m) Weight: 179 lb (81.2 kg) BMI (Calculated): 31.72 Weight at Last Visit: 177 lb Weight Lost Since Last Visit: 0 Weight Gained Since Last Visit: 2 lb Starting Weight: 237 lb Total Weight Loss (lbs): 58 lb (26.3 kg) Peak Weight: 267 lb   Body Composition  Body Fat %: 41.5 % Fat Mass (lbs): 74.6 lbs Muscle Mass (lbs): 99.6 lbs Total Body Water (lbs): 77 lbs Visceral Fat Rating : 9   Other Clinical Data RMR: 1858 Fasting: yes Labs: no Today's Visit #: 9 Starting Date: 03/19/20    Chief Complaint:   OBESITY Lauren Garza is here to discuss her progress with her obesity treatment plan.  She is on the the Category 2 Plan and states she is following her eating plan approximately 50 % of the time.  She states she is exercising:None  Interim History:  She has applied for Section 8 Housing and also Income Based Apartment in [place]- she is still awaiting response She has been living with her parents and her 24 year old daughter  She endorses stress at home, r/t to her parents comments/attitude about her dating her current boyfriend Her 66 year old son recently moved out to live with an Aunt due to stress in home. 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  Lauren Garza reports tolerating weekly Wegovy  0.5mg - she prefers to remain on the lowest mx dose  Subjective:   1. Vitamin D  deficiency  Latest Reference Range & Units 06/15/23 10:17 12/22/23 10:52 05/03/24 09:55  Vitamin D , 25-Hydroxy 30.0 - 100.0 ng/mL 49.6 59.1 66.5   Vit D Level stable and at goal She is on bi-weekly Ergocalciferol - denies N/V/Muscle Weakness  2. Insulin  resistance  Latest Reference Range & Units 06/15/23 10:17 12/22/23 10:52 05/03/24 09:55  INSULIN  2.6 - 24.9  uIU/mL 30.9 (H) 11.7 6.5  (H): Data is abnormally high   3. Environmental and seasonal allergies Her established allergist, Dr. Brian Shin, recently passed away. Her next f/u at that practice is a few month off. She is requesting a bridge refill of Singulair  10mg  She reports stable mood, denies SI/HI She requests bridge refill of Xyzak 5mg  today  4. Stress at home She has applied for Section 8 Housing and also Income Based Apartment in [place]- she is still awaiting response She has been living with her parents and her 6 year old daughter  She endorses stress at home, r/t to her parents comments/attitude about her dating her current boyfriend Her 20 year old son recently moved out to live with an Aunt due to stress in home. 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  She denies SI/HI  Assessment/Plan:   1. Vitamin D  deficiency (Primary) Refill - Vitamin D , Ergocalciferol , (DRISDOL ) 1.25 MG (50000 UNIT) CAPS capsule; Take 1 capsule (50,000 Units total) by mouth every 14 (fourteen) days.  Dispense: 8 capsule; Refill: 0  2. Insulin  resistance Increase compliance of Cat 2 MP from 50 to at least 80% Increase daily walking- to help with stress relief and increase cardiovascular health!  3. Environmental and seasonal allergies Refill- ONE TIME BRIDGE REFILL - levocetirizine (XYZAL ) 5 MG tablet; Take 1 tablet (5 mg total) by  mouth every evening.  Dispense: 90 tablet; Refill: 0  4. Stress at home Increase daily walking- to help with stress relief and increase cardiovascular health! Dicussed f/u with various gov't agencies Dicussed moving out and with a roommate to help offset living expenses  5. Obesity, Current BMI 31.8 Refill  Semaglutide -Weight Management (WEGOVY ) 0.5 MG/0.5ML SOAJ Inject 0.5 mg into the skin once a week. Dispense: 2 mL, Refills: 0 ordered   Lauren Garza is not currently in the action  stage of change. As such, her goal is to get back to weightloss efforts . She has agreed to the Category 2 Plan.   Exercise goals: All adults should avoid inactivity. Some physical activity is better than none, and adults who participate in any amount of physical activity gain some health benefits. Adults should also include muscle-strengthening activities that involve all major muscle groups on 2 or more days a week. Increase daily walking  Behavioral modification strategies: increasing lean protein intake, decreasing simple carbohydrates, increasing vegetables, increasing water intake, decreasing eating out, no skipping meals, meal planning and cooking strategies, keeping healthy foods in the home, ways to avoid boredom eating, ways to avoid night time snacking, better snacking choices, emotional eating strategies, and planning for success.  Lauren Garza with our clinic in 4 weeks. She was informed of the importance of frequent Garza visits to maximize her success with intensive lifestyle modifications for her multiple health conditions.   Objective:   Blood pressure 119/81, pulse 82, temperature 98.5 F (36.9 C), height 5' 3 (1.6 m), weight 179 lb (81.2 kg), last menstrual period 02/11/2018, SpO2 100%. Body mass index is 31.71 kg/m.  General: Cooperative, alert, well developed, in no acute distress. HEENT: Conjunctivae and lids unremarkable. Cardiovascular: Regular rhythm.  Lungs: Normal work of breathing. Neurologic: No focal deficits.   Lab Results  Component Value Date   CREATININE 0.75 05/03/2024   BUN 10 05/03/2024   NA 142 05/03/2024   K 4.2 05/03/2024   CL 105 05/03/2024   CO2 21 05/03/2024   Lab Results  Component Value Date   ALT 12 05/03/2024   AST 14 05/03/2024   ALKPHOS 88 05/03/2024   BILITOT 0.4 05/03/2024   Lab Results  Component Value Date   HGBA1C 5.1 05/03/2024   HGBA1C 5.4 12/22/2023   HGBA1C 5.6 06/15/2023   HGBA1C 5.7 (H)  01/12/2023   HGBA1C 5.7 (H) 09/17/2022   Lab Results  Component Value Date   INSULIN  6.5 05/03/2024   INSULIN  11.7 12/22/2023   INSULIN  30.9 (H) 06/15/2023   INSULIN  8.3 09/17/2022   INSULIN  10.3 03/18/2022   Lab Results  Component Value Date   TSH 1.470 01/05/2023   Lab Results  Component Value Date   CHOL 164 09/17/2022   HDL 67 09/17/2022   LDLCALC 86 09/17/2022   TRIG 51 09/17/2022   CHOLHDL 2.4 09/17/2022   Lab Results  Component Value Date   VD25OH 66.5 05/03/2024   VD25OH 59.1 12/22/2023   VD25OH 49.6 06/15/2023   Lab Results  Component Value Date   WBC 14.8 (H) 06/05/2023   HGB 13.1 06/05/2023   HCT 41.4 06/05/2023   MCV 84.8 06/05/2023   PLT 316 06/05/2023   No results found for: IRON, TIBC, FERRITIN  Attestation Statements:   Reviewed by clinician on day of visit: allergies, medications, problem list, medical history, surgical history, family history, social history, and previous encounter notes.  I have reviewed the above documentation for accuracy and completeness, and  I agree with the above. -  Mayte Diers d. Arlester Keehan, NP-C

## 2024-07-18 ENCOUNTER — Ambulatory Visit: Admitting: Adult Health

## 2024-07-18 VITALS — BP 101/68 | HR 81

## 2024-07-18 DIAGNOSIS — G43709 Chronic migraine without aura, not intractable, without status migrainosus: Secondary | ICD-10-CM

## 2024-07-18 MED ORDER — ONABOTULINUMTOXINA 200 UNITS IJ SOLR
155.0000 [IU] | Freq: Once | INTRAMUSCULAR | Status: AC
  Administered 2024-07-18: 159 [IU] via INTRAMUSCULAR

## 2024-07-18 NOTE — Progress Notes (Signed)
 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

## 2024-07-18 NOTE — Progress Notes (Signed)
 07/18/24: Patient reports that migraine have been under control with Botox .  She states that she has not had any migraines since her last Botox .  She reports sinus headaches with weather shifts.  She does have a sinus headache today.  Has tolerated Botox  well in the past.  She still would like us  to avoid injecting the corrugators or procerus. Injected the temporalis hide behind the hairline as directed by Dr. Darleen.  Patient reports that she was given additional Botox  in the temporalis and frontalis.  I did give an extra +2 units in the temporalis.  She remains on Ajovy .  She is also on Topamax  for IIH.  Reports that she has been out of medication for a week.  Encouraged her to refill her medicine and get restarted on this.  04/25/2024: DO NOT INJECT CORRUGATORS or PROCERUS. > 90% improvement in migraine and headache frwqnecy. Temporalis high behind the hairline   01/27/24: This is patient's first Botox .  I reviewed potential side effects of Botox  with the patient and reviewed the procedure itself. Consent signed. Patient is concerned about possible asymmetry on the face.  She would like to avoid injecting the face.  She does have a hair piece on today.  She was able to remove it so we could proceed with Botox .  However it was hard to visualize where to inject in the occipital region and cervical paraspinal muscles.  Dr. Darleen did come into the visit and did the injections in those regions   BOTOX  PROCEDURE NOTE FOR MIGRAINE HEADACHE    Contraindications and precautions discussed with patient(above). Aseptic procedure was observed and patient tolerated procedure. Procedure performed by Duwaine Russell, NP  The condition has existed for more than 6 months, and pt does not have a diagnosis of ALS, Myasthenia Gravis or Lambert-Eaton Syndrome.  Risks and benefits of injections discussed and pt agrees to proceed with the procedure.  Written consent obtained  These injections are medically necessary.   She receives good benefit from these injections.  These injections do not cause sedations or hallucinations which the oral therapies may cause.  Indication/Diagnosis: chronic migraine BOTOX (G9414) injection was performed according to protocol by [employer]. 200 units of BOTOX  was dissolved into 4 cc NS.   NDC: [address]-98  Type of toxin: Botox  Botox - 200 units x 1 vial Lot: [id number] Expiration: 01/2027 NDC: 9976-6078-97   Bacteriostatic 0.9% Sodium Chloride - 4 mL  Lot: [id number] Expiration: 08/2025 NDC: [address]-97   Dx: H56.290    Description of procedure:  The patient was placed in a sitting position. The standard protocol was used for Botox  as follows, with 5 units of Botox  injected at each site:   -Procerus muscle, midline injection  -Corrugator muscle, bilateral injection  -Frontalis muscle, bilateral injection, with 2 sites each side, medial injection was performed in the upper one third of the frontalis muscle, in the region vertical from the medial inferior edge of the superior orbital rim. The lateral injection was again in the upper one third of the forehead vertically above the lateral limbus of the cornea, 1.5 cm lateral to the medial injection site.  -Temporalis muscle injection, 4 sites, bilaterally. The first injection was 3 cm above the tragus of the ear, second injection site was 1.5 cm to 3 cm up from the first injection site in line with the tragus of the ear. The third injection site was 1.5-3 cm forward between the first 2 injection sites. The fourth injection site was 1.5 cm posterior to  the second injection site.  +2 additional units bilaterally  -Occipitalis muscle injection, 3 sites, bilaterally. The first injection was done one half way between the occipital protuberance and the tip of the mastoid process behind the ear. The second injection site was done lateral and superior to the first, 1 fingerbreadth from the first injection. The third injection site  was 1 fingerbreadth superiorly and medially from the first injection site.  -Cervical paraspinal muscle injection, 2 sites, bilateral knee first injection site was 1 cm from the midline of the cervical spine, 3 cm inferior to the lower border of the occipital protuberance. The second injection site was 1.5 cm superiorly and laterally to the first injection site.  -Trapezius muscle injection was performed at 3 sites, bilaterally. The first injection site was in the upper trapezius muscle halfway between the inflection point of the neck, and the acromion. The second injection site was one half way between the acromion and the first injection site. The third injection was done between the first injection site and the inflection point of the neck.   Will return for repeat injection in 3 months.   A 200 unit sof Botox  was used, 159 units were injected, the rest of the Botox  was wasted. The patient tolerated the procedure well, there were no complications of the above procedure.  Duwaine Russell, MSN, NP-C 07/18/2024, 10:00 AM [facility] [address] [place], [place] [address] [phone]

## 2024-08-01 ENCOUNTER — Ambulatory Visit (INDEPENDENT_AMBULATORY_CARE_PROVIDER_SITE_OTHER): Admitting: Internal Medicine

## 2024-08-01 ENCOUNTER — Encounter (INDEPENDENT_AMBULATORY_CARE_PROVIDER_SITE_OTHER): Payer: Self-pay | Admitting: Internal Medicine

## 2024-08-01 VITALS — BP 108/71 | HR 62 | Temp 98.5°F | Ht 63.0 in | Wt 181.0 lb

## 2024-08-01 DIAGNOSIS — E66811 Obesity, class 1: Secondary | ICD-10-CM | POA: Diagnosis not present

## 2024-08-01 DIAGNOSIS — Z6832 Body mass index (BMI) 32.0-32.9, adult: Secondary | ICD-10-CM | POA: Diagnosis not present

## 2024-08-01 DIAGNOSIS — E88819 Insulin resistance, unspecified: Secondary | ICD-10-CM

## 2024-08-01 DIAGNOSIS — E559 Vitamin D deficiency, unspecified: Secondary | ICD-10-CM

## 2024-08-01 DIAGNOSIS — R7303 Prediabetes: Secondary | ICD-10-CM

## 2024-08-01 MED ORDER — WEGOVY 0.5 MG/0.5ML ~~LOC~~ SOAJ: 0.5000 mg | SUBCUTANEOUS | 0 refills | Status: DC

## 2024-08-01 MED ORDER — VITAMIN D (ERGOCALCIFEROL) 1.25 MG (50000 UNIT) PO CAPS: 50000.0000 [IU] | ORAL_CAPSULE | ORAL | 0 refills | Status: DC

## 2024-08-01 NOTE — Assessment & Plan Note (Signed)
 Weight: decrease of 58 lb (24.3%) over 4 years, 5 months  Start: 02/16/2020 239 lb (108.4 kg)  End: 08/01/2024 181 lb (82.1 kg)  Obesity, class 1 with a BMI of 32. Significant weight loss achieved since 2021, with a total loss of 58 pounds from a starting weight of 239 pounds. Current weight is 181 pounds. Recent weight gain since June, attributed to dietary habits and lack of physical activity. Current treatment includes Wegovy  0.5 mg, below the therapeutic dose. Preserved muscle mass with a body fat percentage of 41%, aiming to reduce it to 34%. - Send prescription for Wegovy  0.5 mg to [facility] in [place].  Patient has a 0.25 mg dose filled erroneously by pharmacy. - Advise to increase Wegovy  dose to 1 mg at next visit. - Encourage increased physical activity, aiming for 240 minutes per week. - Provide education on dietary changes, emphasizing increased fiber and protein intake. - Provide list of high-fiber foods. - Discuss importance of hydration and fiber intake to prevent constipation with higher Wegovy  doses.  Insulin  resistance Insulin  resistance with significant improvement. Initial insulin  levels were in the 30s, now reduced to 6.5. Previous prediabetes with an A1c of 5.7, now improved to 5.1. Exercise and dietary changes are emphasized to further improve insulin  sensitivity. - Encourage increased physical activity to improve insulin  sensitivity. - Advise on dietary modifications to reduce simple sugars and saturated fats. - Continue Wegovy  for pharmacoprophylaxis medication will be increased to 0.5 mg once a week  Vitamin D  deficiency on replacement therapy Vitamin D  deficiency managed with high-dose vitamin D . Current regimen includes one capsule every two weeks. Previous attempts with over-the-counter vitamin D  were ineffective in maintaining adequate levels.  Continue biweekly high-dose vitamin D  supplementation as per prescribing provider

## 2024-08-01 NOTE — Assessment & Plan Note (Addendum)
 Weight: decrease of 58 lb (24.3%) over 4 years, 5 months  Start: 02/16/2020 239 lb (108.4 kg)  End: 08/01/2024 181 lb (82.1 kg)  Obesity, class 1 with a BMI of 32. Significant weight loss achieved since 2021, with a total loss of 58 pounds from a starting weight of 239 pounds. Current weight is 181 pounds. Recent weight gain since June, attributed to dietary habits and lack of physical activity. Current treatment includes Wegovy  0.5 mg, below the therapeutic dose. Preserved muscle mass with a body fat percentage of 41%, aiming to reduce it to 34%. - Send prescription for Wegovy  0.5 mg to [facility] in [place].  Patient has a 0.25 mg dose filled erroneously by pharmacy. - Advise to increase Wegovy  dose to 1 mg at next visit. - Encourage increased physical activity, aiming for 240 minutes per week. - Provide education on dietary changes, emphasizing increased fiber and protein intake. - Provide list of high-fiber foods. - Discuss importance of hydration and fiber intake to prevent constipation with higher Wegovy  doses.  Insulin  resistance Insulin  resistance with significant improvement. Initial insulin  levels were in the 30s, now reduced to 6.5. Previous prediabetes with an A1c of 5.7, now improved to 5.1. Exercise and dietary changes are emphasized to further improve insulin  sensitivity. - Encourage increased physical activity to improve insulin  sensitivity. - Advise on dietary modifications to reduce simple sugars and saturated fats. - Continue Wegovy  for pharmacoprophylaxis medication will be increased to 0.5 mg once a week  Vitamin D  deficiency on replacement therapy Vitamin D  deficiency managed with high-dose vitamin D . Current regimen includes one capsule every two weeks. Previous attempts with over-the-counter vitamin D  were ineffective in maintaining adequate levels.  Continue biweekly high-dose vitamin D  supplementation as per prescribing provider

## 2024-08-01 NOTE — Progress Notes (Signed)
 Office: [phone]  /  Fax: [phone]  Weight Summary and Body Composition Analysis (BIA)  Vitals Temp: 98.5 F (36.9 C) BP: 108/71 Pulse Rate: 62 SpO2: 97 %   Anthropometric Measurements Height: 5' 3 (1.6 m) Weight: 181 lb (82.1 kg) BMI (Calculated): 32.07 Weight at Last Visit: 179 lb Weight Lost Since Last Visit: 0 lb Weight Gained Since Last Visit: 2 lb Starting Weight: 237 lb Total Weight Loss (lbs): 56 lb (25.4 kg) Peak Weight: 267 lb   Body Composition  Body Fat %: 41.9 % Fat Mass (lbs): 76 lbs Muscle Mass (lbs): 100 lbs Total Body Water (lbs): 76.6 lbs Visceral Fat Rating : 9    RMR: [id number]  Today's Visit #: 75  Starting Date: 03/19/20   Subjective   Chief Complaint: Obesity  Interval History Discussed the use of AI scribe software for clinical note transcription with the patient, who gave verbal consent to proceed.  History of Present Illness Lauren Garza is a 43 year old female with insulin  resistance and hyperlipidemia who presents for medical weight management.  Lauren Garza has achieved a total weight loss of 56 pounds since starting her weight management journey, although there has been a recent gain of 2 pounds since her last visit. Lauren Garza follows a 1200 calorie meal plan with fair adherence but acknowledges skipping meals and not consistently tracking her food intake. Lauren Garza consumes the recommended amount of protein but lacks whole foods in her diet. Sleep is reported as adequate.  Lauren Garza has been on Wegovy  0.5 mg for weight management, having switched from Ozempic  due to insurance coverage issues. Lauren Garza started Wegovy  earlier this year and has been on it for less than a year, with no reported side effects. Additionally, Lauren Garza takes topiramate  for migraine management and to prevent fluid retention in the brain. Her highest weight was 265 pounds, and Lauren Garza began her weight management program at 239 pounds. Her current weight is 181 pounds, with a BMI of  32.  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 Lauren Garza acknowledges that her physical activity levels could be improved.  Lauren Garza has a history of insulin  resistance and was previously prediabetic, with a family history of diabetes affecting her mother and grandmother. Her A1c is currently 5.1, and her insulin  levels have decreased from the 30s to 6.5. Lauren Garza reports minimal sweets consumption.  Lauren Garza inquires about her vitamin D  supplementation, which Lauren Garza takes every two weeks. Lauren Garza notes that over-the-counter vitamin D  was ineffective for her in the past.     Challenges affecting patient progress: low volume of physical activity at present  and recent lapse in weight management plan due to work, travel, illness or family circumstances.    Pharmacotherapy for weight management: Lauren Garza is currently taking Wegovy  without side effects. and Lauren Garza was recently dispensed the wrong dose so Lauren Garza has been taking 0.25 mg 3.   Assessment and Plan   Treatment Plan For Obesity:  Recommended Dietary Goals  Lauren Garza is currently in the action stage of change. As such, her goal is to continue weight management plan. Lauren Garza has agreed to: continue current plan  Behavioral Health and Counseling  We discussed the following behavioral modification strategies today: continue to work on maintaining a reduced calorie state, getting the recommended amount of protein, incorporating whole foods, making healthy choices, staying well hydrated and practicing mindfulness when eating. and increase protein intake, fibrous foods (25 grams per day for women,  30 grams for men) and water to improve satiety and decrease hunger signals. .  Additional education and resources provided today: None  Recommended Physical Activity Goals  Lauren Garza has been advised to work up to 150 minutes of moderate intensity aerobic activity a week and strengthening exercises 2-3  times per week for cardiovascular health, weight loss maintenance and preservation of muscle mass.  Lauren Garza has agreed to :  Increase volume of physical activity to a goal of 240 minutes a week and Combine aerobic and strengthening exercises for efficiency and improved cardiometabolic health.  Medical Interventions and Pharmacotherapy  We discussed various medication options to help Lauren Garza with her weight loss efforts and we both agreed to : Increase Wegovy  to .5mg  once a week  Associated Conditions Impacted by Obesity Treatment  Assessment & Plan Class 1 obesity with serious comorbidity and body mass index (BMI) of 32.0 to 32.9 in adult, unspecified obesity type Vitamin D  deficiency Prediabetes Insulin  resistance Weight: decrease of 58 lb (24.3%) over 4 years, 5 months  Start: 02/16/2020 239 lb (108.4 kg)  End: 08/01/2024 181 lb (82.1 kg)  Obesity, class 1 with a BMI of 32. Significant weight loss achieved since 2021, with a total loss of 58 pounds from a starting weight of 239 pounds. Current weight is 181 pounds. Recent weight gain since June, attributed to dietary habits and lack of physical activity. Current treatment includes Wegovy  0.5 mg, below the therapeutic dose. Preserved muscle mass with a body fat percentage of 41%, aiming to reduce it to 34%. - Send prescription for Wegovy  0.5 mg to [facility] in [place].  Patient has a 0.25 mg dose filled erroneously by pharmacy. - Advise to increase Wegovy  dose to 1 mg at next visit. - Encourage increased physical activity, aiming for 240 minutes per week. - Provide education on dietary changes, emphasizing increased fiber and protein intake. - Provide list of high-fiber foods. - Discuss importance of hydration and fiber intake to prevent constipation with higher Wegovy  doses.  Insulin  resistance Insulin  resistance with significant improvement. Initial insulin  levels were in the 30s, now reduced to 6.5. Previous prediabetes with an A1c of  5.7, now improved to 5.1. Exercise and dietary changes are emphasized to further improve insulin  sensitivity. - Encourage increased physical activity to improve insulin  sensitivity. - Advise on dietary modifications to reduce simple sugars and saturated fats. - Continue Wegovy  for pharmacoprophylaxis medication will be increased to 0.5 mg once a week  Vitamin D  deficiency on replacement therapy Vitamin D  deficiency managed with high-dose vitamin D . Current regimen includes one capsule every two weeks. Previous attempts with over-the-counter vitamin D  were ineffective in maintaining adequate levels.  Continue biweekly high-dose vitamin D  supplementation as per prescribing provider          Objective   Physical Exam:  Blood pressure 108/71, pulse 62, temperature 98.5 F (36.9 C), height 5' 3 (1.6 m), weight 181 lb (82.1 kg), last menstrual period 02/11/2018, SpO2 97%. Body mass index is 32.06 kg/m.  General: Lauren Garza is overweight, cooperative, alert, well developed, and in no acute distress. PSYCH: Has normal mood, affect and thought process.   HEENT: EOMI, sclerae are anicteric. Lungs: Normal breathing effort, no conversational dyspnea. Extremities: No edema.  Neurologic: No gross sensory or motor deficits. No tremors or fasciculations noted.    Diagnostic Data Reviewed:  BMET    Component Value Date/Time   NA 142 05/03/2024 0955   K 4.2 05/03/2024 0955   CL 105 05/03/2024 0955   CO2  21 05/03/2024 0955   GLUCOSE 80 05/03/2024 0955   GLUCOSE 92 06/05/2023 1004   BUN 10 05/03/2024 0955   CREATININE 0.75 05/03/2024 0955   CALCIUM  9.0 05/03/2024 0955   GFRNONAA >60 06/05/2023 1004   GFRAA 115 09/26/2020 1052   Lab Results  Component Value Date   HGBA1C 5.1 05/03/2024   HGBA1C 5.4 03/19/2020   Lab Results  Component Value Date   INSULIN  6.5 05/03/2024   INSULIN  9.9 03/19/2020   Lab Results  Component Value Date   TSH 1.470 01/05/2023   CBC    Component Value  Date/Time   WBC 14.8 (H) 06/05/2023 1004   RBC 4.88 06/05/2023 1004   HGB 13.1 06/05/2023 1004   HGB 13.1 03/19/2020 1140   HCT 41.4 06/05/2023 1004   HCT 40.9 03/19/2020 1140   PLT 316 06/05/2023 1004   PLT 345 03/19/2020 1140   MCV 84.8 06/05/2023 1004   MCV 85 03/19/2020 1140   MCH 26.8 06/05/2023 1004   MCHC 31.6 06/05/2023 1004   RDW 14.0 06/05/2023 1004   RDW 13.1 03/19/2020 1140   Iron Studies No results found for: IRON, TIBC, FERRITIN, IRONPCTSAT Lipid Panel     Component Value Date/Time   CHOL 164 09/17/2022 1027   TRIG 51 09/17/2022 1027   HDL 67 09/17/2022 1027   CHOLHDL 2.4 09/17/2022 1027   LDLCALC 86 09/17/2022 1027   Hepatic Function Panel     Component Value Date/Time   PROT 6.6 05/03/2024 0955   ALBUMIN 4.2 05/03/2024 0955   AST 14 05/03/2024 0955   ALT 12 05/03/2024 0955   ALKPHOS 88 05/03/2024 0955   BILITOT 0.4 05/03/2024 0955      Component Value Date/Time   TSH 1.470 01/05/2023 0837   TSH 1.360 03/19/2020 1140   Nutritional Lab Results  Component Value Date   VD25OH 66.5 05/03/2024   VD25OH 59.1 12/22/2023   VD25OH 49.6 06/15/2023    Medications: Outpatient Encounter Medications as of 08/01/2024  Medication Sig   albuterol  (ACCUNEB ) 0.63 MG/3ML nebulizer solution Take by nebulization.   Boric Acid Vaginal 600 MG SUPP INSERT 1 SUPPOSITORY VAGINALLY AT BEDTIME. FOR VAGINAL USE   BOTOX  200 units injection    cyanocobalamin (,VITAMIN B-12,) 1000 MCG/ML injection Inject 1,000 mcg into the skin every 30 (thirty) days.   EPINEPHrine  (EPIPEN  2-PAK) 0.3 mg/0.3 mL IJ SOAJ injection as directed Injection as directed for 30 days   fluconazole  (DIFLUCAN ) 150 MG tablet Take 150 mg by mouth daily.   Fremanezumab -vfrm (AJOVY ) 225 MG/1.5ML SOAJ Inject 225 mg into the skin every 30 (thirty) days.   Fremanezumab -vfrm (AJOVY ) 225 MG/1.5ML SOAJ Inject 225 mg into the skin every 30 (thirty) days.   levocetirizine (XYZAL ) 5 MG tablet Take 1 tablet (5  mg total) by mouth every evening.   montelukast  (SINGULAIR ) 10 MG tablet Take 1 tablet (10 mg total) by mouth at bedtime.   Semaglutide -Weight Management (WEGOVY ) 0.5 MG/0.5ML SOAJ Inject 0.5 mg into the skin once a week.   SYMBICORT 80-4.5 MCG/ACT inhaler SMARTSIG:2 Puff(s) By Mouth Twice Daily   tinidazole  (TINDAMAX ) 500 MG tablet SMARTSIG:4 Tablet(s) By Mouth   topiramate  (TOPAMAX ) 100 MG tablet Take 1 tablet (100 mg total) by mouth at bedtime. TAKE HALF TABLET BY MOUTH FOR 2 WEEKS, THEN 1 TABLET BY MOUTH EVERY NIGHT AT BEDTIME   Vitamin D , Ergocalciferol , (DRISDOL ) 1.25 MG (50000 UNIT) CAPS capsule Take 1 capsule (50,000 Units total) by mouth every 14 (fourteen) days.   No  facility-administered encounter medications on file as of 08/01/2024.     Follow-Up   No follow-ups on file.SABRA Lauren Garza was informed of the importance of frequent follow up visits to maximize her success with intensive lifestyle modifications for her multiple health conditions.  Attestation Statement   Reviewed by clinician on day of visit: allergies, medications, problem list, medical history, surgical history, family history, social history, and previous encounter notes.     Lucas Parker, MD

## 2024-08-09 ENCOUNTER — Encounter: Payer: Self-pay | Admitting: Neurology

## 2024-08-15 ENCOUNTER — Other Ambulatory Visit: Payer: Self-pay | Admitting: Neurology

## 2024-08-15 DIAGNOSIS — G43709 Chronic migraine without aura, not intractable, without status migrainosus: Secondary | ICD-10-CM

## 2024-08-16 NOTE — Telephone Encounter (Signed)
 Last filled 07/20/24 with 0 refills  last office visit was 11/08/23 Patient needs to shedule an office visit for futhere refills. medication has been sent in with 3 refills.  Patient has been notified via mychart

## 2024-08-22 NOTE — Telephone Encounter (Signed)
 error

## 2024-09-04 ENCOUNTER — Encounter (INDEPENDENT_AMBULATORY_CARE_PROVIDER_SITE_OTHER): Payer: Self-pay | Admitting: Adult Health

## 2024-09-04 ENCOUNTER — Ambulatory Visit (INDEPENDENT_AMBULATORY_CARE_PROVIDER_SITE_OTHER): Admitting: Adult Health

## 2024-09-04 VITALS — BP 118/76 | HR 82 | Temp 98.8°F | Ht 63.0 in | Wt 180.0 lb

## 2024-09-04 DIAGNOSIS — R7303 Prediabetes: Secondary | ICD-10-CM | POA: Diagnosis not present

## 2024-09-04 DIAGNOSIS — E66813 Obesity, class 3: Secondary | ICD-10-CM

## 2024-09-04 DIAGNOSIS — E538 Deficiency of other specified B group vitamins: Secondary | ICD-10-CM

## 2024-09-04 DIAGNOSIS — E559 Vitamin D deficiency, unspecified: Secondary | ICD-10-CM | POA: Diagnosis not present

## 2024-09-04 DIAGNOSIS — Z6831 Body mass index (BMI) 31.0-31.9, adult: Secondary | ICD-10-CM

## 2024-09-04 DIAGNOSIS — E669 Obesity, unspecified: Secondary | ICD-10-CM

## 2024-09-04 DIAGNOSIS — F439 Reaction to severe stress, unspecified: Secondary | ICD-10-CM

## 2024-09-04 DIAGNOSIS — Z0000 Encounter for general adult medical examination without abnormal findings: Secondary | ICD-10-CM

## 2024-09-04 MED ORDER — VITAMIN D (ERGOCALCIFEROL) 1.25 MG (50000 UNIT) PO CAPS: 50000.0000 [IU] | ORAL_CAPSULE | ORAL | 0 refills | Status: DC

## 2024-09-04 NOTE — Progress Notes (Signed)
 WEIGHT SUMMARY AND BIOMETRICS  Vitals Temp: 98.8 F (37.1 C) BP: 118/76 Pulse Rate: 82 SpO2: 99 %   Anthropometric Measurements Height: 5' 3 (1.6 m) Weight: 180 lb (81.6 kg) BMI (Calculated): 31.89 Weight at Last Visit: 181lb Weight Lost Since Last Visit: 1lb Weight Gained Since Last Visit: 0lb Starting Weight: 237lb Total Weight Loss (lbs): 57 lb (25.9 kg) Peak Weight: 267lb   Body Composition  Body Fat %: 42.2 % Fat Mass (lbs): 76 lbs Muscle Mass (lbs): 98.8 lbs Total Body Water (lbs): 76.2 lbs Visceral Fat Rating : 9   Other Clinical Data RMR: [id number] Fasting: No Labs: No Today's Visit #: 85 Starting Date: 03/19/20    Chief Complaint:   OBESITY Lauren Garza is here to discuss her progress with her obesity treatment plan.  She is on the the Category 2 Plan and states she is following her eating plan approximately 50 % of the time.  She states she is exercising: NEAT Activities   Interim History:  Stress: She lives with her parents and her 66 year old daughter. 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  Lauren Garza estimates to have been living with her mother and step-father > 10 years.  She receives child support for her minor daughter, estimates to be at least $200/month  Due to her Medicaid and Food Stamp benefits, she is limited with how many hours per week that she can work.  She is on waiting list for Section 8 Housing in [facility]- last update was early 2025.  She is still dating her boyfriend- he lives with his grandmother. She is unable to move in with them.  We discussed housing options and various employment opportunities.  Exercise-none  Of Note- Started Wegovy  0.25mg  on/about 09/01/2023  10/14/2023 Wegovy  0.25mg   increased to 0.5mg   Medicaid has ceased GLP- 1 coverage for obesity treatment as of 30 Aug 2024. She has one dose at home- okay to take final injection.  Subjective:   1. Healthcare maintenance Medicaid has stopped coverage for GLP-1 and GIP/GLP-1 therapy for weight loss, effective 30 Aug 2024 She has been on weekly Wegovy  0.5mg   2. Vitamin B12 deficiency  Latest Reference Range & Units 06/15/23 10:17 12/22/23 10:52 05/03/24 09:55  Vitamin B12 232 - 1,245 pg/mL >2000 (H) 843 [id number]  (H): Data is abnormally high  She is on monthly B12 injections, managed by PCP  3. Prediabetes Lab Results  Component Value Date   HGBA1C 5.1 05/03/2024   HGBA1C 5.4 12/22/2023   HGBA1C 5.6 06/15/2023    Started Wegovy  0.25mg  on/about 09/01/2023  10/14/2023 Wegovy  0.25mg  increased to 0.5mg   Medicaid has ceased GLP- 1 coverage for obesity treatment as of 30 Aug 2024. She has one dose at home- okay to take final injection.  4. Vitamin D  deficiency  Latest Reference Range & Units 06/15/23 10:17 12/22/23 10:52 05/03/24 09:55  Vitamin D , 25-Hydroxy 30.0 - 100.0 ng/mL 49.6 59.1 66.5   She is on bi-weekly Ergocalciferol - denies N/V/Muscle Weakness  5. Stress at home Stress: She lives with her parents and her 77 year old daughter. 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  Ms. Uyeno estimates to have been living with her mother and step-father > 10 years.  She receives child support for her minor daughter, estimates to be at least $200/month  Due to her Medicaid and Food Stamp benefits, she is limited with how many hours per week that she can work.  She is on waiting list for Section 8 Housing in [facility]- last update was early 2025.  She is still dating her boyfriend- he lives with  his grandmother. She is unable to move in with them.  We discussed housing options and various employment opportunities.  Assessment/Plan:   1. Healthcare maintenance (Primary) Take final Wegovy  0.5mg  injection, then remain off.  2. Vitamin B12 deficiency F/u with PCP  3. Prediabetes Take final Wegovy  0.5mg  then remain off.  4. Vitamin D  deficiency Refill Vitamin D , Ergocalciferol , (DRISDOL ) 1.25 MG (50000 UNIT) CAPS capsule Take 1 capsule (50,000 Units total) by mouth every 14 (fourteen) days. Dispense: 8 capsule, Refills: 0 ordered   5. Stress at home Consider increasing hours at [employer] Consider Remote Work Have earnest discussion with her parents about their current living situation  6. Obesity, Current BMI 31.9 Take final Wegovy  0.5mg  injection, then remain off.  Lauren Garza is currently in the action stage of change. As such, her goal is to continue with weight loss efforts. She has agreed to the Category 2 Plan.   Exercise goals: All adults should avoid inactivity. Some physical activity is better than none, and adults who participate in any amount of physical activity gain some health benefits. Adults should also include muscle-strengthening activities that involve all major muscle groups on 2 or more days a week.  Behavioral modification strategies: increasing lean protein intake, decreasing simple carbohydrates, increasing vegetables, increasing water intake, no skipping meals, meal planning and cooking strategies, keeping healthy foods in the home, ways to avoid boredom eating, planning for success, and decreasing junk food.  Lauren Garza has agreed to follow-up with our clinic in 4 weeks. She was informed of the importance of frequent follow-up visits to maximize her success with intensive lifestyle modifications for her multiple health conditions.   Check Fasting Labs Fall 2025  Objective:   Blood pressure 118/76,  pulse 82, temperature 98.8 F (37.1 C), height 5' 3 (1.6 m), weight 180 lb (81.6 kg), last menstrual period 02/11/2018, SpO2 99%. Body mass index is 31.89 kg/m.  General: Cooperative, alert, well developed, in no acute distress. HEENT: Conjunctivae and lids unremarkable. Cardiovascular: Regular rhythm.  Lungs: Normal work of breathing. Neurologic: No focal deficits.   Lab Results  Component Value Date   CREATININE 0.75 05/03/2024   BUN 10 05/03/2024   NA 142 05/03/2024   K 4.2 05/03/2024   CL 105 05/03/2024   CO2 21 05/03/2024   Lab Results  Component Value Date   ALT 12 05/03/2024   AST 14 05/03/2024   ALKPHOS 88 05/03/2024   BILITOT 0.4 05/03/2024   Lab Results  Component Value Date   HGBA1C 5.1 05/03/2024   HGBA1C 5.4 12/22/2023   HGBA1C 5.6 06/15/2023   HGBA1C 5.7 (H) 01/12/2023   HGBA1C 5.7 (H) 09/17/2022   Lab Results  Component Value Date   INSULIN  6.5 05/03/2024   INSULIN  11.7 12/22/2023   INSULIN  30.9 (H) 06/15/2023   INSULIN  8.3 09/17/2022   INSULIN  10.3 03/18/2022   Lab Results  Component Value Date   TSH 1.470 01/05/2023   Lab Results  Component Value Date   CHOL 164 09/17/2022   HDL 67 09/17/2022   LDLCALC 86 09/17/2022   TRIG 51 09/17/2022   CHOLHDL 2.4 09/17/2022   Lab Results  Component Value Date   VD25OH 66.5 05/03/2024   VD25OH 59.1 12/22/2023   VD25OH 49.6 06/15/2023   Lab Results  Component Value Date   WBC 14.8 (H) 06/05/2023   HGB 13.1 06/05/2023   HCT 41.4 06/05/2023   MCV 84.8 06/05/2023   PLT 316 06/05/2023   No results found for: IRON, TIBC, FERRITIN  Attestation Statements:   Reviewed by clinician on day of visit: allergies, medications, problem list, medical history, surgical history, family history, social history, and previous encounter notes.  I have reviewed the above documentation for accuracy and completeness, and I agree with the above. -  Eriyanna Kofoed d. Ambur Province, NP-C

## 2024-10-02 ENCOUNTER — Ambulatory Visit (INDEPENDENT_AMBULATORY_CARE_PROVIDER_SITE_OTHER): Payer: Self-pay | Admitting: Adult Health

## 2024-10-02 ENCOUNTER — Encounter (INDEPENDENT_AMBULATORY_CARE_PROVIDER_SITE_OTHER): Payer: Self-pay | Admitting: Adult Health

## 2024-10-02 VITALS — BP 107/76 | HR 76 | Temp 98.3°F | Ht 63.0 in | Wt 184.0 lb

## 2024-10-02 DIAGNOSIS — E559 Vitamin D deficiency, unspecified: Secondary | ICD-10-CM | POA: Diagnosis not present

## 2024-10-02 DIAGNOSIS — R7303 Prediabetes: Secondary | ICD-10-CM

## 2024-10-02 DIAGNOSIS — Z0000 Encounter for general adult medical examination without abnormal findings: Secondary | ICD-10-CM

## 2024-10-02 DIAGNOSIS — Z6832 Body mass index (BMI) 32.0-32.9, adult: Secondary | ICD-10-CM

## 2024-10-02 DIAGNOSIS — E66813 Obesity, class 3: Secondary | ICD-10-CM

## 2024-10-02 DIAGNOSIS — E669 Obesity, unspecified: Secondary | ICD-10-CM | POA: Diagnosis not present

## 2024-10-02 DIAGNOSIS — E538 Deficiency of other specified B group vitamins: Secondary | ICD-10-CM | POA: Diagnosis not present

## 2024-10-02 MED ORDER — VITAMIN D (ERGOCALCIFEROL) 1.25 MG (50000 UNIT) PO CAPS: 50000.0000 [IU] | ORAL_CAPSULE | ORAL | 0 refills | Status: DC

## 2024-10-02 NOTE — Progress Notes (Signed)
 WEIGHT SUMMARY AND BIOMETRICS  Vitals Temp: 98.3 F (36.8 C) BP: 107/76 Pulse Rate: 76 SpO2: 100 %   Anthropometric Measurements Height: 5' 3 (1.6 m) Weight: 184 lb (83.5 kg) BMI (Calculated): 32.6 Weight at Last Visit: 180lb Weight Lost Since Last Visit: 0lb Weight Gained Since Last Visit: 4lb Starting Weight: 237lb Total Weight Loss (lbs): 53 lb (24 kg) Peak Weight: 267lb   Body Composition  Body Fat %: 43.1 % Fat Mass (lbs): 79.6 lbs Muscle Mass (lbs): 99.6 lbs Total Body Water (lbs): 78.2 lbs Visceral Fat Rating : 9   Other Clinical Data RMR: [id number] Fasting: No Labs: no Today's Visit #: 92 Starting Date: 03/19/20    Chief Complaint:   OBESITY Lauren Garza is here to discuss her progress with her obesity treatment plan.  She is on the the Category 2 Plan and states she is following her eating plan approximately 50 % of the time.  She states she is exercising: None.  Interim History:  She continues to live with her parents and her 24 year old daughter. Her parents have not mentioned the request for her to move out by 30 Nov 2024. Due to gov't shutdown, she anticipates that her Food Stamps will not be issued this Wednesday. She typically receives Food Stamps the 5th of each month.  Of Note- She has three childres: Age 68, son- lives locally with his girlfriend Age 52, son- lives with paternal Aunt Age 30, daughter- lives with Ms. Haan and her grandparents  Subjective:   1. Healthcare maintenance Dr. Ilah is her PCP Neurology status is managed by Dr. Ines and Duwaine Russell , NP-C  2. Vitamin D  deficiency  Latest Reference Range & Units 06/15/23 10:17 12/22/23 10:52 05/03/24 09:55  Vitamin D , 25-Hydroxy 30.0 - 100.0 ng/mL 49.6 59.1 66.5   She is on bi-weekly Ergocalciferol   3. Vitamin B12 deficiency  Latest Reference Range & Units 06/15/23 10:17 12/22/23 10:52 05/03/24 09:55  Vitamin B12 232 - 1,245 pg/mL >2000 (H) 843 814  (H): Data is  abnormally high  B12 level is stable  4. Prediabetes Lab Results  Component Value Date   HGBA1C 5.1 05/03/2024   HGBA1C 5.4 12/22/2023   HGBA1C 5.6 06/15/2023    Started Wegovy  0.25mg  on/about 09/01/2023  10/14/2023 Wegovy  0.25mg  increased to 0.5mg    Medicaid has ceased GLP- 1 coverage for obesity treatment as of 30 Aug 2024.  Last dose of Wegovy  0.5mg  was in mid Oct 2025  Assessment/Plan:   1. Healthcare maintenance (Primary) Continue healthy eating and increase regular exercise Continue f/u with various providers  2. Vitamin D  deficiency Refill - Vitamin D , Ergocalciferol , (DRISDOL ) 1.25 MG (50000 UNIT) CAPS capsule; Take 1 capsule (50,000 Units total) by mouth every 14 (fourteen) days.  Dispense: 8 capsule; Refill: 0  3. Vitamin B12 deficiency Monitor Labs  4. Prediabetes Continue healthy eating and increase regular exercise  5. Obesity, Current BMI 32.7  Lauren Garza is not currently in the action stage of change. As such, her goal is to get back to weightloss efforts . She has agreed to the Category 2 Plan.   Exercise goals: All adults should avoid inactivity. Some physical activity is better than none, and adults who participate in any amount of physical activity gain some health benefits. Adults should also include muscle-strengthening activities that involve all major muscle groups on 2 or more days a week. Walk 2 x Week. Use YouTube 2 x week for strength training.  Behavioral modification strategies: increasing  lean protein intake, decreasing simple carbohydrates, increasing vegetables, increasing water intake, no skipping meals, meal planning and cooking strategies, keeping healthy foods in the home, ways to avoid boredom eating, and planning for success.  Lauren Garza has agreed to follow-up with our clinic in 4 weeks. She was informed of the importance of frequent follow-up visits to maximize her success with intensive lifestyle modifications for her multiple health  conditions.   Check Fasting Labs Winter 2025  Objective:   Blood pressure 107/76, pulse 76, temperature 98.3 F (36.8 C), height 5' 3 (1.6 m), weight 184 lb (83.5 kg), last menstrual period 02/11/2018, SpO2 100%. Body mass index is 32.59 kg/m.  General: Cooperative, alert, well developed, in no acute distress. HEENT: Conjunctivae and lids unremarkable. Cardiovascular: Regular rhythm.  Lungs: Normal work of breathing. Neurologic: No focal deficits.   Lab Results  Component Value Date   CREATININE 0.75 05/03/2024   BUN 10 05/03/2024   NA 142 05/03/2024   K 4.2 05/03/2024   CL 105 05/03/2024   CO2 21 05/03/2024   Lab Results  Component Value Date   ALT 12 05/03/2024   AST 14 05/03/2024   ALKPHOS 88 05/03/2024   BILITOT 0.4 05/03/2024   Lab Results  Component Value Date   HGBA1C 5.1 05/03/2024   HGBA1C 5.4 12/22/2023   HGBA1C 5.6 06/15/2023   HGBA1C 5.7 (H) 01/12/2023   HGBA1C 5.7 (H) 09/17/2022   Lab Results  Component Value Date   INSULIN  6.5 05/03/2024   INSULIN  11.7 12/22/2023   INSULIN  30.9 (H) 06/15/2023   INSULIN  8.3 09/17/2022   INSULIN  10.3 03/18/2022   Lab Results  Component Value Date   TSH 1.470 01/05/2023   Lab Results  Component Value Date   CHOL 164 09/17/2022   HDL 67 09/17/2022   LDLCALC 86 09/17/2022   TRIG 51 09/17/2022   CHOLHDL 2.4 09/17/2022   Lab Results  Component Value Date   VD25OH 66.5 05/03/2024   VD25OH 59.1 12/22/2023   VD25OH 49.6 06/15/2023   Lab Results  Component Value Date   WBC 14.8 (H) 06/05/2023   HGB 13.1 06/05/2023   HCT 41.4 06/05/2023   MCV 84.8 06/05/2023   PLT 316 06/05/2023   No results found for: IRON, TIBC, FERRITIN  Attestation Statements:   Reviewed by clinician on day of visit: allergies, medications, problem list, medical history, surgical history, family history, social history, and previous encounter notes.  I have reviewed the above documentation for accuracy and completeness, and  I agree with the above. -  Mahlani Berninger d. Seeley Southgate, NP-C

## 2024-10-12 ENCOUNTER — Encounter: Payer: Self-pay | Admitting: Adult Health

## 2024-10-12 ENCOUNTER — Ambulatory Visit: Admitting: Adult Health

## 2024-10-12 VITALS — BP 120/82 | Ht 63.0 in

## 2024-10-12 DIAGNOSIS — G43709 Chronic migraine without aura, not intractable, without status migrainosus: Secondary | ICD-10-CM | POA: Diagnosis not present

## 2024-10-12 MED ORDER — ONABOTULINUMTOXINA 200 UNITS IJ SOLR
155.0000 [IU] | Freq: Once | INTRAMUSCULAR | Status: AC
  Administered 2024-10-12: 155 [IU] via INTRAMUSCULAR

## 2024-10-12 NOTE — Progress Notes (Signed)
 10/12/24:  Reports that she is only had 3 migraines since her last Botox  injections.  She states that she has tolerated Botox  well.  She would still like us  to avoid injecting the corrugators and the procerus muscles.  Returns today for an evaluation. 07/18/24: Patient reports that migraine have been under control with Botox .  She states that she has not had any migraines since her last Botox .  She reports sinus headaches with weather shifts.  She does have a sinus headache today.  Has tolerated Botox  well in the past.  She still would like us  to avoid injecting the corrugators or procerus. Injected the temporalis hide behind the hairline as directed by Dr. Darleen.  Patient reports that she was given additional Botox  in the temporalis and frontalis.  I did give an extra +2 units in the temporalis.  She remains on Ajovy .  She is also on Topamax  for IIH.  Reports that she has been out of medication for a week.  Encouraged her to refill her medicine and get restarted on this.  04/25/2024: DO NOT INJECT CORRUGATORS or PROCERUS. > 90% improvement in migraine and headache frwqnecy. Temporalis high behind the hairline   01/27/24: This is patient's first Botox .  I reviewed potential side effects of Botox  with the patient and reviewed the procedure itself. Consent signed. Patient is concerned about possible asymmetry on the face.  She would like to avoid injecting the face.  She does have a hair piece on today.  She was able to remove it so we could proceed with Botox .  However it was hard to visualize where to inject in the occipital region and cervical paraspinal muscles.  Dr. Darleen did come into the visit and did the injections in those regions   BOTOX  PROCEDURE NOTE FOR MIGRAINE HEADACHE    Contraindications and precautions discussed with patient(above). Aseptic procedure was observed and patient tolerated procedure. Procedure performed by Duwaine Russell, NP  The condition has existed for more than 6  months, and pt does not have a diagnosis of ALS, Myasthenia Gravis or Lambert-Eaton Syndrome.  Risks and benefits of injections discussed and pt agrees to proceed with the procedure.  Written consent obtained  These injections are medically necessary.  She receives good benefit from these injections.  These injections do not cause sedations or hallucinations which the oral therapies may cause.  Indication/Diagnosis: chronic migraine BOTOX (G9414) injection was performed according to protocol by [employer]. 200 units of BOTOX  was dissolved into 4 cc NS.   NDC: [address]-98  Type of toxin: Botox  Botox - 200 units x 1 vial Lot: [id number] Expiration: 04/2026 NDC: 9976-6078-97   Bacteriostatic 0.9% Sodium Chloride - 4 mL  Lot: [id number] Expiration: 08/2025 NDC: 9590-8033-97   Dx: H56.290      Description of procedure:  The patient was placed in a sitting position. The standard protocol was used for Botox  as follows, with 5 units of Botox  injected at each site:   -Procerus muscle, deferred -Corrugator muscle, deferred  -Frontalis muscle, bilateral injection, with 2 sites each side, medial injection was performed in the upper one third of the frontalis muscle, in the region vertical from the medial inferior edge of the superior orbital rim. The lateral injection was again in the upper one third of the forehead vertically above the lateral limbus of the cornea, 1.5 cm lateral to the medial injection site.  -Temporalis muscle injection, 4 sites, bilaterally. The first injection was 3 cm above the tragus of the ear, second injection site  was 1.5 cm to 3 cm up from the first injection site in line with the tragus of the ear. The third injection site was 1.5-3 cm forward between the first 2 injection sites. The fourth injection site was 1.5 cm posterior to the second injection site.  +2 additional units bilaterally  -Occipitalis muscle injection, 3 sites, bilaterally. The first injection was done one  half way between the occipital protuberance and the tip of the mastoid process behind the ear. The second injection site was done lateral and superior to the first, 1 fingerbreadth from the first injection. The third injection site was 1 fingerbreadth superiorly and medially from the first injection site.  -Cervical paraspinal muscle injection, 2 sites, bilateral knee first injection site was 1 cm from the midline of the cervical spine, 3 cm inferior to the lower border of the occipital protuberance. The second injection site was 1.5 cm superiorly and laterally to the first injection site.  -Trapezius muscle injection was performed at 3 sites, bilaterally. The first injection site was in the upper trapezius muscle halfway between the inflection point of the neck, and the acromion. The second injection site was one half way between the acromion and the first injection site. The third injection was done between the first injection site and the inflection point of the neck.   Will return for repeat injection in 3 months.   A 200 unit sof Botox  was used, 155 units were injected, the rest of the Botox  was wasted. The patient tolerated the procedure well, there were no complications of the above procedure.  Duwaine Russell, MSN, NP-C 10/12/2024, 8:34 AM [facility] [address] [place], [place] [address] [phone]

## 2024-10-12 NOTE — Progress Notes (Signed)
 Botox - 200 units x 1 vial Lot: [id number] Expiration: 04/2026 NDC: 9976-6078-97  Bacteriostatic 0.9% Sodium Chloride - 4 mL  Lot: [id number] Expiration: 08/2025 NDC: 9590-8033-97  Dx: H56.290 S/P  Witnessed by Sherrod LATHER

## 2024-11-02 ENCOUNTER — Ambulatory Visit (INDEPENDENT_AMBULATORY_CARE_PROVIDER_SITE_OTHER): Admitting: Adult Health

## 2024-11-02 ENCOUNTER — Encounter (INDEPENDENT_AMBULATORY_CARE_PROVIDER_SITE_OTHER): Payer: Self-pay | Admitting: Adult Health

## 2024-11-02 VITALS — BP 116/66 | HR 71 | Temp 97.6°F | Ht 63.0 in | Wt 192.0 lb

## 2024-11-02 DIAGNOSIS — E538 Deficiency of other specified B group vitamins: Secondary | ICD-10-CM | POA: Diagnosis not present

## 2024-11-02 DIAGNOSIS — E669 Obesity, unspecified: Secondary | ICD-10-CM

## 2024-11-02 DIAGNOSIS — E88819 Insulin resistance, unspecified: Secondary | ICD-10-CM

## 2024-11-02 DIAGNOSIS — E559 Vitamin D deficiency, unspecified: Secondary | ICD-10-CM

## 2024-11-02 DIAGNOSIS — R7303 Prediabetes: Secondary | ICD-10-CM | POA: Diagnosis not present

## 2024-11-02 DIAGNOSIS — E66813 Obesity, class 3: Secondary | ICD-10-CM

## 2024-11-02 DIAGNOSIS — Z6834 Body mass index (BMI) 34.0-34.9, adult: Secondary | ICD-10-CM

## 2024-11-02 NOTE — Progress Notes (Signed)
 WEIGHT SUMMARY AND BIOMETRICS  Vitals Temp: 97.6 F (36.4 C) BP: 116/66 Pulse Rate: 71 SpO2: 99 %   Anthropometric Measurements Height: 5' 3 (1.6 m) Weight: 192 lb (87.1 kg) BMI (Calculated): 34.02 Weight at Last Visit: 184lb Weight Lost Since Last Visit: 0lb Weight Gained Since Last Visit: 8lb Starting Weight: 237lb Total Weight Loss (lbs): 45 lb (20.4 kg) Peak Weight: 267lb   Body Composition  Body Fat %: 43.7 % Fat Mass (lbs): 84 lbs Muscle Mass (lbs): 102.6 lbs Total Body Water (lbs): 78.4 lbs Visceral Fat Rating : 10   Other Clinical Data RMR: [id number] Fasting: No Labs: no Today's Visit #: 77 Starting Date: 03/19/20    Chief Complaint:   OBESITY Lauren Garza is here to discuss her progress with her obesity treatment plan.  She is on the the Category 2 Plan and states she is following her eating plan approximately 50 % of the time.  She states she is exercising: NEAT Activities  Interim History:  Since the government has re-opened, all of her assistance programs have been reinstated and she is paid up to date for benefits.  She is still experiencing tension at home with her parents.  Her youngest daughter will graduate May 2026 from high school.  She endorses excellent support from local family/friends/church community  Subjective:   1. Prediabetes Started Wegovy  0.25mg  on/about 09/01/2023  10/14/2023 Wegovy  0.25mg  increased to 0.5mg    Medicaid has ceased GLP- 1 coverage for obesity treatment as of 30 Aug 2024.   Last dose of Wegovy  0.5mg  was in mid Oct 2025  2. Vitamin B12 deficiency  Latest Reference Range & Units 06/15/23 10:17 12/22/23 10:52 05/03/24 09:55  Vitamin B12 232 - 1,245 pg/mL >2000 (H) [phone]  (H): Data is abnormally high  B12 level stable and at goal  3. Vitamin D  deficiency  Latest Reference Range & Units 06/15/23 10:17 12/22/23 10:52 05/03/24 09:55  Vitamin D , 25-Hydroxy 30.0 - 100.0 ng/mL 49.6 59.1 66.5   Vit D Level  stable She is on bi-weekly Ergocalciferol   4. Insulin  resistance Started Wegovy  0.25mg  on/about 09/01/2023  10/14/2023 Wegovy  0.25mg  increased to 0.5mg    Medicaid has ceased GLP- 1 coverage for obesity treatment as of 30 Aug 2024.   Last dose of Wegovy  0.5mg  was in mid Oct 2025  Assessment/Plan:   1. Prediabetes (Primary) Continue healthy eating and increase daily activity Check Labs at next OV  2. Vitamin B12 deficiency Check Labs at next OV  3. Vitamin D  deficiency Check Labs at next OV  4. Insulin  resistance Continue healthy eating and increase daily activity Check Labs at next OV  5. Obesity, Current BMI 34.0  Lauren Garza is currently in the action stage of change. As such, her goal is to continue with weight loss efforts. She has agreed to the Category 2 Plan.   Exercise goals: For substantial health benefits, adults should do at least 150 minutes (2 hours and 30 minutes) a week of moderate-intensity, or 75 minutes (1 hour and 15 minutes) a week of vigorous-intensity aerobic physical activity, or an equivalent combination of moderate- and vigorous-intensity aerobic activity. Aerobic activity should be performed in episodes of at least 10 minutes, and preferably, it should be spread throughout the week.  Behavioral modification strategies: increasing lean protein intake, decreasing simple carbohydrates, increasing vegetables, increasing water intake, meal planning and cooking strategies, keeping healthy foods in the home, ways to avoid boredom eating, and planning for success.  Lauren Garza has agreed to follow-up  with our clinic in 4 weeks. She was informed of the importance of frequent follow-up visits to maximize her success with intensive lifestyle modifications for her multiple health conditions.   Check Fasting Labs at next OV  Objective:   Blood pressure 116/66, pulse 71, temperature 97.6 F (36.4 C), height 5' 3 (1.6 m), weight 192 lb (87.1 kg), last menstrual period  02/11/2018, SpO2 99%. Body mass index is 34.01 kg/m.  General: Cooperative, alert, well developed, in no acute distress. HEENT: Conjunctivae and lids unremarkable. Cardiovascular: Regular rhythm.  Lungs: Normal work of breathing. Neurologic: No focal deficits.   Lab Results  Component Value Date   CREATININE 0.75 05/03/2024   BUN 10 05/03/2024   NA 142 05/03/2024   K 4.2 05/03/2024   CL 105 05/03/2024   CO2 21 05/03/2024   Lab Results  Component Value Date   ALT 12 05/03/2024   AST 14 05/03/2024   ALKPHOS 88 05/03/2024   BILITOT 0.4 05/03/2024   Lab Results  Component Value Date   HGBA1C 5.1 05/03/2024   HGBA1C 5.4 12/22/2023   HGBA1C 5.6 06/15/2023   HGBA1C 5.7 (H) 01/12/2023   HGBA1C 5.7 (H) 09/17/2022   Lab Results  Component Value Date   INSULIN  6.5 05/03/2024   INSULIN  11.7 12/22/2023   INSULIN  30.9 (H) 06/15/2023   INSULIN  8.3 09/17/2022   INSULIN  10.3 03/18/2022   Lab Results  Component Value Date   TSH 1.470 01/05/2023   Lab Results  Component Value Date   CHOL 164 09/17/2022   HDL 67 09/17/2022   LDLCALC 86 09/17/2022   TRIG 51 09/17/2022   CHOLHDL 2.4 09/17/2022   Lab Results  Component Value Date   VD25OH 66.5 05/03/2024   VD25OH 59.1 12/22/2023   VD25OH 49.6 06/15/2023   Lab Results  Component Value Date   WBC 14.8 (H) 06/05/2023   HGB 13.1 06/05/2023   HCT 41.4 06/05/2023   MCV 84.8 06/05/2023   PLT 316 06/05/2023   No results found for: IRON, TIBC, FERRITIN  Attestation Statements:   Reviewed by clinician on day of visit: allergies, medications, problem list, medical history, surgical history, family history, social history, and previous encounter notes.  Time spent on visit including pre-visit chart review and post-visit care and charting was 27 minutes.   I have reviewed the above documentation for accuracy and completeness, and I agree with the above. -  Brigid Vandekamp d. Ataya Murdy, NP-C

## 2024-12-04 MED ORDER — ONABOTULINUMTOXINA 200 UNITS IJ SOLR: INTRAMUSCULAR | 3 refills | Status: DC

## 2024-12-04 MED ORDER — ONABOTULINUMTOXINA 200 UNITS IJ SOLR: INTRAMUSCULAR | 3 refills | Status: AC

## 2024-12-04 NOTE — Telephone Encounter (Signed)
 Botox  200 unit Rx accidentally sent to [facility] which is preferred pharmacy on list. I have canceled this and resent the Rx to [facility]. I also tried to call [facility] but their phones were down so I faxed the discontinuation to [facility] [phone]. Received a receipt of confirmation.

## 2024-12-04 NOTE — Addendum Note (Signed)
 Addended by: HILLIARD HEATHER CROME on: 12/04/2024 11:27 AM   Modules accepted: Orders

## 2024-12-04 NOTE — Telephone Encounter (Signed)
 Received fax that [facility] needs Botox  refills.

## 2024-12-05 ENCOUNTER — Ambulatory Visit (INDEPENDENT_AMBULATORY_CARE_PROVIDER_SITE_OTHER): Admitting: Adult Health

## 2024-12-11 ENCOUNTER — Telehealth: Payer: Self-pay | Admitting: Adult Health

## 2024-12-11 NOTE — Telephone Encounter (Signed)
Updated appt note

## 2024-12-11 NOTE — Telephone Encounter (Signed)
 [facility]) schedule delivery of Botox . Can deliver on  Wednesday, 12/13/24

## 2024-12-27 ENCOUNTER — Other Ambulatory Visit: Payer: Self-pay

## 2024-12-27 ENCOUNTER — Encounter (INDEPENDENT_AMBULATORY_CARE_PROVIDER_SITE_OTHER): Payer: Self-pay | Admitting: Adult Health

## 2024-12-27 ENCOUNTER — Ambulatory Visit (INDEPENDENT_AMBULATORY_CARE_PROVIDER_SITE_OTHER): Admitting: Adult Health

## 2024-12-27 VITALS — BP 103/69 | HR 82 | Temp 98.2°F | Ht 63.0 in | Wt 195.0 lb

## 2024-12-27 DIAGNOSIS — E559 Vitamin D deficiency, unspecified: Secondary | ICD-10-CM

## 2024-12-27 DIAGNOSIS — Z6834 Body mass index (BMI) 34.0-34.9, adult: Secondary | ICD-10-CM

## 2024-12-27 DIAGNOSIS — E669 Obesity, unspecified: Secondary | ICD-10-CM | POA: Diagnosis not present

## 2024-12-27 DIAGNOSIS — G43009 Migraine without aura, not intractable, without status migrainosus: Secondary | ICD-10-CM

## 2024-12-27 DIAGNOSIS — J3089 Other allergic rhinitis: Secondary | ICD-10-CM | POA: Diagnosis not present

## 2024-12-27 DIAGNOSIS — Z6841 Body Mass Index (BMI) 40.0 and over, adult: Secondary | ICD-10-CM

## 2024-12-27 DIAGNOSIS — G43709 Chronic migraine without aura, not intractable, without status migrainosus: Secondary | ICD-10-CM

## 2024-12-27 DIAGNOSIS — J302 Other seasonal allergic rhinitis: Secondary | ICD-10-CM | POA: Diagnosis not present

## 2024-12-27 MED ORDER — AJOVY 225 MG/1.5ML ~~LOC~~ SOAJ: 225.0000 mg | SUBCUTANEOUS | 3 refills | Status: AC

## 2024-12-27 MED ORDER — VITAMIN D (ERGOCALCIFEROL) 1.25 MG (50000 UNIT) PO CAPS: 50000.0000 [IU] | ORAL_CAPSULE | ORAL | 0 refills | Status: AC

## 2024-12-27 NOTE — Progress Notes (Unsigned)
 "    WEIGHT SUMMARY AND BIOMETRICS  Vitals Temp: 98.2 F (36.8 C) BP: 103/69 Pulse Rate: 82 SpO2: 100 %   Anthropometric Measurements Height: 5' 3 (1.6 m) Weight: 195 lb (88.5 kg) BMI (Calculated): 34.55 Weight at Last Visit: 192lb Weight Lost Since Last Visit: 0lb Weight Gained Since Last Visit: 3lb Starting Weight: 237lb Total Weight Loss (lbs): 42 lb (19.1 kg) Peak Weight: 267lb   Body Composition  Body Fat %: 44.6 % Fat Mass (lbs): 87.2 lbs Muscle Mass (lbs): 103 lbs Total Body Water (lbs): 79.6 lbs Visceral Fat Rating : 10   Other Clinical Data RMR: [id number] Fasting: No Labs: no Today's Visit #: 32 Starting Date: 03/19/20    Chief Complaint:   OBESITY Lauren Garza is here to discuss her progress with her obesity treatment plan.  She is on the the Category 2 Plan and states she is following her eating plan approximately 50 % of the time.  She states she is exercising: None.  Interim History:  Unable to complete fasting labs at OV today  She has increased daily coffee intake, 2-3 cups per day during cold weather winter  Her daughter will graduate from [employer] this may. She and her likely move out her parents home after her daughter graduates and finds gainful employment.   Subjective:   1. Migraine without aura, not intractable, without status migrainosus Due to acute migriane sx's she cancelled her last OV at [facility] She is followed by Neurology- she is taking Topamax  100mg  1-2 tabs nightly  2. Environmental and seasonal allergies 10/30/2024 OV with established Allergist- reviewed encounter notes with pt. She reports asthma is well controlled. She has been tobacco/vape/marijuana free for > 4 months  3. Vitamin D  deficiency  Latest Reference Range & Units 06/15/23 10:17 12/22/23 10:52 05/03/24 09:55  Vitamin D , 25-Hydroxy 30.0 - 100.0 ng/mL 49.6 59.1 66.5   Vit D Level stable and at goal  Assessment/Plan:   1. Migraine without aura, not  intractable, without status migrainosus (Primary) Contact established with Neurologist for refill on maintenance meds  2. Environmental and seasonal allergies Remain tobacco free Increase regular cardiovascular exercise Remain well hydrated with water  3. Vitamin D  deficiency Refill - Vitamin D , Ergocalciferol , (DRISDOL ) 1.25 MG (50000 UNIT) CAPS capsule; Take 1 capsule (50,000 Units total) by mouth every 14 (fourteen) days.  Dispense: 8 capsule; Refill: 0  4. Obesity, Current BMI 34.6  Sareen is not currently in the action stage of change. As such, her goal is to get back to weightloss efforts . She has agreed to the Category 2 Plan.   Exercise goals: All adults should avoid inactivity. Some physical activity is better than none, and adults who participate in any amount of physical activity gain some health benefits. Adults should also include muscle-strengthening activities that involve all major muscle groups on 2 or more days a week. Increase daily walking  Behavioral modification strategies: increasing lean protein intake, decreasing simple carbohydrates, increasing vegetables, increasing water intake, no skipping meals, meal planning and cooking strategies, keeping healthy foods in the home, better snacking choices, and planning for success.  [name] has agreed to follow-up with our clinic in 4 weeks. She was informed of the importance of frequent follow-up visits to maximize her success with intensive lifestyle modifications for her multiple health conditions.   Complete Fasting Labs next OV  Objective:   Blood pressure 103/69, pulse 82, temperature 98.2 F (36.8 C), height 5' 3 (1.6 m), weight 195 lb (88.5 kg),  last menstrual period 02/11/2018, SpO2 100%. Body mass index is 34.54 kg/m.  General: Cooperative, alert, well developed, in no acute distress. HEENT: Conjunctivae and lids unremarkable. Cardiovascular: Regular rhythm.  Lungs: Normal work of  breathing. Neurologic: No focal deficits.   Lab Results  Component Value Date   CREATININE 0.75 05/03/2024   BUN 10 05/03/2024   NA 142 05/03/2024   K 4.2 05/03/2024   CL 105 05/03/2024   CO2 21 05/03/2024   Lab Results  Component Value Date   ALT 12 05/03/2024   AST 14 05/03/2024   ALKPHOS 88 05/03/2024   BILITOT 0.4 05/03/2024   Lab Results  Component Value Date   HGBA1C 5.1 05/03/2024   HGBA1C 5.4 12/22/2023   HGBA1C 5.6 06/15/2023   HGBA1C 5.7 (H) 01/12/2023   HGBA1C 5.7 (H) 09/17/2022   Lab Results  Component Value Date   INSULIN  6.5 05/03/2024   INSULIN  11.7 12/22/2023   INSULIN  30.9 (H) 06/15/2023   INSULIN  8.3 09/17/2022   INSULIN  10.3 03/18/2022   Lab Results  Component Value Date   TSH 1.470 01/05/2023   Lab Results  Component Value Date   CHOL 164 09/17/2022   HDL 67 09/17/2022   LDLCALC 86 09/17/2022   TRIG 51 09/17/2022   CHOLHDL 2.4 09/17/2022   Lab Results  Component Value Date   VD25OH 66.5 05/03/2024   VD25OH 59.1 12/22/2023   VD25OH 49.6 06/15/2023   Lab Results  Component Value Date   WBC 14.8 (H) 06/05/2023   HGB 13.1 06/05/2023   HCT 41.4 06/05/2023   MCV 84.8 06/05/2023   PLT 316 06/05/2023   No results found for: IRON, TIBC, FERRITIN  Attestation Statements:   Reviewed by clinician on day of visit: allergies, medications, problem list, medical history, surgical history, family history, social history, and previous encounter notes.  I have reviewed the above documentation for accuracy and completeness, and I agree with the above. -  Avree Szczygiel d. Daisia Slomski, NP-C "

## 2025-01-16 ENCOUNTER — Ambulatory Visit: Admitting: Adult Health

## 2025-01-23 ENCOUNTER — Ambulatory Visit (INDEPENDENT_AMBULATORY_CARE_PROVIDER_SITE_OTHER): Admitting: Physician Assistant
